# Patient Record
Sex: Male | Born: 1935
Health system: Southern US, Community
[De-identification: ages and names within clinical notes are randomized; demographics above are authoritative.]

## PROBLEM LIST (undated history)

## (undated) DIAGNOSIS — N3281 Overactive bladder: Secondary | ICD-10-CM

## (undated) DIAGNOSIS — R972 Elevated prostate specific antigen [PSA]: Secondary | ICD-10-CM

## (undated) DIAGNOSIS — E785 Hyperlipidemia, unspecified: Secondary | ICD-10-CM

## (undated) DIAGNOSIS — G20 Parkinson's disease: Secondary | ICD-10-CM

## (undated) DIAGNOSIS — T8859XA Other complications of anesthesia, initial encounter: Secondary | ICD-10-CM

## (undated) DIAGNOSIS — M5136 Other intervertebral disc degeneration, lumbar region: Secondary | ICD-10-CM

## (undated) DIAGNOSIS — F32A Depression, unspecified: Secondary | ICD-10-CM

## (undated) DIAGNOSIS — R682 Dry mouth, unspecified: Secondary | ICD-10-CM

## (undated) DIAGNOSIS — N400 Enlarged prostate without lower urinary tract symptoms: Secondary | ICD-10-CM

## (undated) DIAGNOSIS — N3941 Urge incontinence: Secondary | ICD-10-CM

## (undated) DIAGNOSIS — G20A1 Parkinson's disease without dyskinesia, without mention of fluctuations: Secondary | ICD-10-CM

## (undated) DIAGNOSIS — J309 Allergic rhinitis, unspecified: Secondary | ICD-10-CM

## (undated) DIAGNOSIS — R350 Frequency of micturition: Secondary | ICD-10-CM

## (undated) DIAGNOSIS — G4733 Obstructive sleep apnea (adult) (pediatric): Secondary | ICD-10-CM

## (undated) DIAGNOSIS — K219 Gastro-esophageal reflux disease without esophagitis: Secondary | ICD-10-CM

## (undated) DIAGNOSIS — H9190 Unspecified hearing loss, unspecified ear: Secondary | ICD-10-CM

## (undated) DIAGNOSIS — T4145XA Adverse effect of unspecified anesthetic, initial encounter: Secondary | ICD-10-CM

## (undated) DIAGNOSIS — Z87442 Personal history of urinary calculi: Secondary | ICD-10-CM

## (undated) DIAGNOSIS — M1990 Unspecified osteoarthritis, unspecified site: Secondary | ICD-10-CM

## (undated) DIAGNOSIS — I10 Essential (primary) hypertension: Secondary | ICD-10-CM

## (undated) DIAGNOSIS — M51369 Other intervertebral disc degeneration, lumbar region without mention of lumbar back pain or lower extremity pain: Secondary | ICD-10-CM

## (undated) DIAGNOSIS — F419 Anxiety disorder, unspecified: Secondary | ICD-10-CM

## (undated) DIAGNOSIS — R351 Nocturia: Secondary | ICD-10-CM

## (undated) DIAGNOSIS — C679 Malignant neoplasm of bladder, unspecified: Secondary | ICD-10-CM

## (undated) DIAGNOSIS — F329 Major depressive disorder, single episode, unspecified: Secondary | ICD-10-CM

## (undated) HISTORY — DX: Hyperlipidemia, unspecified: E78.5

## (undated) HISTORY — DX: Unspecified osteoarthritis, unspecified site: M19.90

## (undated) HISTORY — DX: Essential (primary) hypertension: I10

## (undated) HISTORY — DX: Parkinson's disease without dyskinesia, without mention of fluctuations: G20.A1

## (undated) HISTORY — PX: EYE SURGERY: SHX253

## (undated) HISTORY — DX: Parkinson's disease: G20

---
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
## 1967-04-21 HISTORY — PX: OTHER SURGICAL HISTORY: SHX169

## 1980-04-20 HISTORY — PX: NASAL SINUS SURGERY: SHX719

## 1998-10-24 ENCOUNTER — Ambulatory Visit (HOSPITAL_COMMUNITY): Admission: RE | Admit: 1998-10-24 | Discharge: 1998-10-24 | Payer: Self-pay | Admitting: Gastroenterology

## 1999-08-06 ENCOUNTER — Encounter: Payer: Self-pay | Admitting: Gastroenterology

## 1999-08-06 ENCOUNTER — Encounter: Admission: RE | Admit: 1999-08-06 | Discharge: 1999-08-06 | Payer: Self-pay | Admitting: Gastroenterology

## 2001-11-30 ENCOUNTER — Ambulatory Visit (HOSPITAL_COMMUNITY): Admission: RE | Admit: 2001-11-30 | Discharge: 2001-11-30 | Payer: Self-pay | Admitting: Gastroenterology

## 2001-11-30 ENCOUNTER — Encounter (INDEPENDENT_AMBULATORY_CARE_PROVIDER_SITE_OTHER): Payer: Self-pay | Admitting: Specialist

## 2003-08-09 ENCOUNTER — Encounter: Admission: RE | Admit: 2003-08-09 | Discharge: 2003-08-09 | Payer: Self-pay | Admitting: Family Medicine

## 2006-06-23 ENCOUNTER — Ambulatory Visit: Payer: Self-pay | Admitting: Family Medicine

## 2006-06-30 ENCOUNTER — Ambulatory Visit: Payer: Self-pay | Admitting: Family Medicine

## 2006-06-30 LAB — CONVERTED CEMR LAB
ALT: 7 units/L (ref 0–40)
AST: 17 units/L (ref 0–37)
BUN: 22 mg/dL (ref 6–23)
CO2: 30 meq/L (ref 19–32)
Calcium: 8.9 mg/dL (ref 8.4–10.5)
Chloride: 105 meq/L (ref 96–112)
Cholesterol: 112 mg/dL (ref 0–200)
Creatinine, Ser: 0.9 mg/dL (ref 0.4–1.5)
GFR calc Af Amer: 107 mL/min
GFR calc non Af Amer: 89 mL/min
Glucose, Bld: 102 mg/dL — ABNORMAL HIGH (ref 70–99)
HDL: 38 mg/dL — ABNORMAL LOW (ref >39.0)
LDL Cholesterol: 63 mg/dL (ref 0–99)
PSA: 2.6 ng/mL (ref 0.10–4.00)
Potassium: 4.4 meq/L (ref 3.5–5.1)
Sodium: 141 meq/L (ref 135–145)
Total CHOL/HDL Ratio: 2.9
Triglycerides: 56 mg/dL (ref 0–149)
VLDL: 11 mg/dL (ref 0–40)

## 2006-08-31 ENCOUNTER — Encounter (INDEPENDENT_AMBULATORY_CARE_PROVIDER_SITE_OTHER): Payer: Self-pay | Admitting: Family Medicine

## 2007-02-08 ENCOUNTER — Telehealth (INDEPENDENT_AMBULATORY_CARE_PROVIDER_SITE_OTHER): Payer: Self-pay | Admitting: *Deleted

## 2007-02-28 ENCOUNTER — Encounter (INDEPENDENT_AMBULATORY_CARE_PROVIDER_SITE_OTHER): Payer: Self-pay | Admitting: *Deleted

## 2007-02-28 ENCOUNTER — Telehealth (INDEPENDENT_AMBULATORY_CARE_PROVIDER_SITE_OTHER): Payer: Self-pay | Admitting: *Deleted

## 2007-02-28 ENCOUNTER — Ambulatory Visit: Payer: Self-pay | Admitting: Family Medicine

## 2007-02-28 DIAGNOSIS — M1990 Unspecified osteoarthritis, unspecified site: Secondary | ICD-10-CM | POA: Insufficient documentation

## 2007-02-28 DIAGNOSIS — I10 Essential (primary) hypertension: Secondary | ICD-10-CM | POA: Insufficient documentation

## 2007-02-28 DIAGNOSIS — F528 Other sexual dysfunction not due to a substance or known physiological condition: Secondary | ICD-10-CM | POA: Insufficient documentation

## 2007-02-28 DIAGNOSIS — E785 Hyperlipidemia, unspecified: Secondary | ICD-10-CM | POA: Insufficient documentation

## 2007-02-28 LAB — CONVERTED CEMR LAB
BUN: 20 mg/dL (ref 6–23)
CO2: 30 meq/L (ref 19–32)
Calcium: 9.3 mg/dL (ref 8.4–10.5)
Chloride: 103 meq/L (ref 96–112)
Creatinine, Ser: 0.9 mg/dL (ref 0.4–1.5)
GFR calc Af Amer: 107 mL/min
GFR calc non Af Amer: 89 mL/min
Glucose, Bld: 97 mg/dL (ref 70–99)
Potassium: 4.1 meq/L (ref 3.5–5.1)
Sodium: 138 meq/L (ref 135–145)

## 2007-03-09 ENCOUNTER — Encounter (INDEPENDENT_AMBULATORY_CARE_PROVIDER_SITE_OTHER): Payer: Self-pay | Admitting: Family Medicine

## 2007-05-13 ENCOUNTER — Ambulatory Visit: Payer: Self-pay | Admitting: Family Medicine

## 2007-05-17 ENCOUNTER — Encounter (INDEPENDENT_AMBULATORY_CARE_PROVIDER_SITE_OTHER): Payer: Self-pay | Admitting: *Deleted

## 2007-05-17 LAB — CONVERTED CEMR LAB
ALT: 7 units/L (ref 0–53)
BUN: 21 mg/dL (ref 6–23)
Calcium: 9.2 mg/dL (ref 8.4–10.5)
Chloride: 105 meq/L (ref 96–112)
Cholesterol: 130 mg/dL (ref 0–200)
GFR calc Af Amer: 95 mL/min
GFR calc non Af Amer: 78 mL/min
LDL Cholesterol: 80 mg/dL (ref 0–99)
PSA: 2.36 ng/mL (ref 0.10–4.00)
VLDL: 15 mg/dL (ref 0–40)

## 2007-05-26 ENCOUNTER — Ambulatory Visit: Payer: Self-pay | Admitting: Gastroenterology

## 2007-06-17 ENCOUNTER — Encounter (INDEPENDENT_AMBULATORY_CARE_PROVIDER_SITE_OTHER): Payer: Self-pay | Admitting: Family Medicine

## 2007-07-20 ENCOUNTER — Telehealth (INDEPENDENT_AMBULATORY_CARE_PROVIDER_SITE_OTHER): Payer: Self-pay | Admitting: *Deleted

## 2007-07-21 ENCOUNTER — Ambulatory Visit: Payer: Self-pay | Admitting: Family Medicine

## 2007-08-18 ENCOUNTER — Telehealth (INDEPENDENT_AMBULATORY_CARE_PROVIDER_SITE_OTHER): Payer: Self-pay | Admitting: *Deleted

## 2007-09-01 ENCOUNTER — Encounter: Payer: Self-pay | Admitting: Family Medicine

## 2007-09-01 ENCOUNTER — Telehealth (INDEPENDENT_AMBULATORY_CARE_PROVIDER_SITE_OTHER): Payer: Self-pay | Admitting: *Deleted

## 2007-10-19 ENCOUNTER — Ambulatory Visit: Payer: Self-pay | Admitting: Internal Medicine

## 2008-01-27 ENCOUNTER — Telehealth (INDEPENDENT_AMBULATORY_CARE_PROVIDER_SITE_OTHER): Payer: Self-pay | Admitting: *Deleted

## 2008-01-30 ENCOUNTER — Ambulatory Visit: Payer: Self-pay | Admitting: Family Medicine

## 2008-02-02 ENCOUNTER — Encounter (INDEPENDENT_AMBULATORY_CARE_PROVIDER_SITE_OTHER): Payer: Self-pay | Admitting: *Deleted

## 2008-02-02 LAB — CONVERTED CEMR LAB
Bilirubin, Direct: 0.2 mg/dL (ref 0.0–0.3)
Calcium: 9.3 mg/dL (ref 8.4–10.5)
Creatinine, Ser: 1 mg/dL (ref 0.4–1.5)
GFR calc Af Amer: 95 mL/min
Sodium: 142 meq/L (ref 135–145)
Total Bilirubin: 3.4 mg/dL — ABNORMAL HIGH (ref 0.3–1.2)
Total Protein: 7.2 g/dL (ref 6.0–8.3)

## 2008-03-30 ENCOUNTER — Telehealth (INDEPENDENT_AMBULATORY_CARE_PROVIDER_SITE_OTHER): Payer: Self-pay | Admitting: *Deleted

## 2008-05-01 ENCOUNTER — Telehealth (INDEPENDENT_AMBULATORY_CARE_PROVIDER_SITE_OTHER): Payer: Self-pay | Admitting: *Deleted

## 2008-06-28 ENCOUNTER — Telehealth (INDEPENDENT_AMBULATORY_CARE_PROVIDER_SITE_OTHER): Payer: Self-pay | Admitting: *Deleted

## 2008-07-03 ENCOUNTER — Encounter: Payer: Self-pay | Admitting: Family Medicine

## 2008-07-05 ENCOUNTER — Ambulatory Visit: Payer: Self-pay | Admitting: Family Medicine

## 2008-07-05 DIAGNOSIS — D485 Neoplasm of uncertain behavior of skin: Secondary | ICD-10-CM | POA: Insufficient documentation

## 2008-07-06 ENCOUNTER — Encounter (INDEPENDENT_AMBULATORY_CARE_PROVIDER_SITE_OTHER): Payer: Self-pay | Admitting: *Deleted

## 2008-07-06 LAB — CONVERTED CEMR LAB
AST: 17 units/L (ref 0–37)
Albumin: 4 g/dL (ref 3.5–5.2)
Alkaline Phosphatase: 103 units/L (ref 39–117)
Basophils Relative: 1 % (ref 0.0–3.0)
CO2: 29 meq/L (ref 19–32)
Calcium: 9.1 mg/dL (ref 8.4–10.5)
Cholesterol: 107 mg/dL (ref 0–200)
Creatinine, Ser: 1 mg/dL (ref 0.4–1.5)
Eosinophils Relative: 11.2 % — ABNORMAL HIGH (ref 0.0–5.0)
HDL: 31.3 mg/dL — ABNORMAL LOW (ref >39.00)
Hemoglobin: 16.8 g/dL (ref 13.0–17.0)
LDL Cholesterol: 63 mg/dL (ref 0–99)
Lymphocytes Relative: 31.2 % (ref 12.0–46.0)
MCHC: 34.6 g/dL (ref 30.0–36.0)
Monocytes Relative: 11.1 % (ref 3.0–12.0)
Neutro Abs: 2.9 10*3/uL (ref 1.4–7.7)
Neutrophils Relative %: 45.5 % (ref 43.0–77.0)
RBC: 5.25 M/uL (ref 4.22–5.81)
Sodium: 140 meq/L (ref 135–145)
Total CHOL/HDL Ratio: 3
Total Protein: 6.8 g/dL (ref 6.0–8.3)
Triglycerides: 64 mg/dL (ref 0.0–149.0)
WBC: 6.4 10*3/uL (ref 4.5–10.5)

## 2008-08-10 ENCOUNTER — Telehealth (INDEPENDENT_AMBULATORY_CARE_PROVIDER_SITE_OTHER): Payer: Self-pay | Admitting: *Deleted

## 2008-09-20 LAB — HM COLONOSCOPY: HM Colonoscopy: NORMAL

## 2008-12-04 ENCOUNTER — Encounter: Payer: Self-pay | Admitting: Family Medicine

## 2009-01-09 ENCOUNTER — Ambulatory Visit: Payer: Self-pay | Admitting: Family Medicine

## 2009-01-09 DIAGNOSIS — M549 Dorsalgia, unspecified: Secondary | ICD-10-CM | POA: Insufficient documentation

## 2009-01-11 ENCOUNTER — Encounter (INDEPENDENT_AMBULATORY_CARE_PROVIDER_SITE_OTHER): Payer: Self-pay | Admitting: *Deleted

## 2009-01-30 ENCOUNTER — Ambulatory Visit: Payer: Self-pay | Admitting: Internal Medicine

## 2009-02-08 ENCOUNTER — Ambulatory Visit: Payer: Self-pay | Admitting: Internal Medicine

## 2009-02-08 ENCOUNTER — Encounter: Payer: Self-pay | Admitting: Internal Medicine

## 2009-02-08 LAB — HM COLONOSCOPY

## 2009-02-12 ENCOUNTER — Encounter: Payer: Self-pay | Admitting: Internal Medicine

## 2009-03-20 ENCOUNTER — Telehealth (INDEPENDENT_AMBULATORY_CARE_PROVIDER_SITE_OTHER): Payer: Self-pay | Admitting: *Deleted

## 2009-04-02 ENCOUNTER — Telehealth (INDEPENDENT_AMBULATORY_CARE_PROVIDER_SITE_OTHER): Payer: Self-pay | Admitting: *Deleted

## 2009-04-10 ENCOUNTER — Telehealth (INDEPENDENT_AMBULATORY_CARE_PROVIDER_SITE_OTHER): Payer: Self-pay | Admitting: *Deleted

## 2009-04-15 ENCOUNTER — Encounter: Admission: RE | Admit: 2009-04-15 | Discharge: 2009-04-19 | Payer: Self-pay | Admitting: Family Medicine

## 2009-04-23 ENCOUNTER — Encounter: Admission: RE | Admit: 2009-04-23 | Discharge: 2009-05-29 | Payer: Self-pay | Admitting: Family Medicine

## 2009-04-24 ENCOUNTER — Encounter: Payer: Self-pay | Admitting: Internal Medicine

## 2009-04-26 ENCOUNTER — Encounter: Payer: Self-pay | Admitting: Internal Medicine

## 2009-05-20 ENCOUNTER — Telehealth (INDEPENDENT_AMBULATORY_CARE_PROVIDER_SITE_OTHER): Payer: Self-pay | Admitting: *Deleted

## 2009-05-22 ENCOUNTER — Ambulatory Visit: Payer: Self-pay | Admitting: Internal Medicine

## 2009-05-22 LAB — CONVERTED CEMR LAB
Blood in Urine, dipstick: NEGATIVE
Glucose, Urine, Semiquant: NEGATIVE
Protein, U semiquant: 100
pH: 5

## 2009-05-23 ENCOUNTER — Encounter: Payer: Self-pay | Admitting: Internal Medicine

## 2009-05-23 LAB — CONVERTED CEMR LAB
Casts: NONE SEEN /lpf
Crystals: NONE SEEN

## 2009-05-29 ENCOUNTER — Encounter: Payer: Self-pay | Admitting: Internal Medicine

## 2009-05-31 ENCOUNTER — Encounter: Payer: Self-pay | Admitting: Family Medicine

## 2009-06-11 ENCOUNTER — Telehealth: Payer: Self-pay | Admitting: Internal Medicine

## 2009-06-11 ENCOUNTER — Ambulatory Visit: Payer: Self-pay | Admitting: Family

## 2009-06-11 DIAGNOSIS — R972 Elevated prostate specific antigen [PSA]: Secondary | ICD-10-CM | POA: Insufficient documentation

## 2009-06-12 ENCOUNTER — Telehealth (INDEPENDENT_AMBULATORY_CARE_PROVIDER_SITE_OTHER): Payer: Self-pay | Admitting: *Deleted

## 2009-06-12 ENCOUNTER — Encounter: Admission: RE | Admit: 2009-06-12 | Discharge: 2009-06-12 | Payer: Self-pay | Admitting: Internal Medicine

## 2009-06-19 ENCOUNTER — Telehealth (INDEPENDENT_AMBULATORY_CARE_PROVIDER_SITE_OTHER): Payer: Self-pay | Admitting: *Deleted

## 2009-06-21 ENCOUNTER — Ambulatory Visit: Payer: Self-pay | Admitting: Internal Medicine

## 2009-06-27 LAB — CONVERTED CEMR LAB
BUN: 17 mg/dL (ref 6–23)
CO2: 30 meq/L (ref 19–32)
Chloride: 106 meq/L (ref 96–112)
Creatinine, Ser: 1 mg/dL (ref 0.4–1.5)
Glucose, Bld: 91 mg/dL (ref 70–99)

## 2009-07-15 ENCOUNTER — Encounter: Payer: Self-pay | Admitting: Internal Medicine

## 2009-07-17 ENCOUNTER — Telehealth: Payer: Self-pay | Admitting: Internal Medicine

## 2009-07-23 ENCOUNTER — Telehealth (INDEPENDENT_AMBULATORY_CARE_PROVIDER_SITE_OTHER): Payer: Self-pay | Admitting: *Deleted

## 2009-08-13 ENCOUNTER — Encounter: Payer: Self-pay | Admitting: Internal Medicine

## 2009-08-28 ENCOUNTER — Ambulatory Visit: Payer: Self-pay | Admitting: Family Medicine

## 2009-08-28 DIAGNOSIS — R634 Abnormal weight loss: Secondary | ICD-10-CM | POA: Insufficient documentation

## 2009-08-28 DIAGNOSIS — F32A Depression, unspecified: Secondary | ICD-10-CM | POA: Insufficient documentation

## 2009-08-28 DIAGNOSIS — F329 Major depressive disorder, single episode, unspecified: Secondary | ICD-10-CM

## 2009-09-04 ENCOUNTER — Ambulatory Visit: Payer: Self-pay | Admitting: Family Medicine

## 2009-09-05 LAB — CONVERTED CEMR LAB
BUN: 24 mg/dL — ABNORMAL HIGH (ref 6–23)
Bilirubin, Direct: 0.3 mg/dL (ref 0.0–0.3)
CO2: 30 meq/L (ref 19–32)
Chloride: 106 meq/L (ref 96–112)
Glucose, Bld: 96 mg/dL (ref 70–99)
LDL Cholesterol: 72 mg/dL (ref 0–99)
Potassium: 4.4 meq/L (ref 3.5–5.1)
Total Bilirubin: 2.2 mg/dL — ABNORMAL HIGH (ref 0.3–1.2)
Total CHOL/HDL Ratio: 3
Total Protein: 6.4 g/dL (ref 6.0–8.3)
VLDL: 11.8 mg/dL (ref 0.0–40.0)

## 2009-09-06 ENCOUNTER — Telehealth (INDEPENDENT_AMBULATORY_CARE_PROVIDER_SITE_OTHER): Payer: Self-pay | Admitting: *Deleted

## 2009-09-23 ENCOUNTER — Telehealth (INDEPENDENT_AMBULATORY_CARE_PROVIDER_SITE_OTHER): Payer: Self-pay | Admitting: *Deleted

## 2009-09-24 ENCOUNTER — Encounter: Payer: Self-pay | Admitting: Family Medicine

## 2009-10-22 ENCOUNTER — Encounter: Payer: Self-pay | Admitting: Family Medicine

## 2009-11-06 ENCOUNTER — Encounter: Payer: Self-pay | Admitting: Family Medicine

## 2009-11-14 ENCOUNTER — Encounter: Admission: RE | Admit: 2009-11-14 | Discharge: 2009-11-14 | Payer: Self-pay | Admitting: Neurosurgery

## 2009-12-02 ENCOUNTER — Encounter: Payer: Self-pay | Admitting: Family Medicine

## 2009-12-03 ENCOUNTER — Ambulatory Visit: Payer: Self-pay | Admitting: Family Medicine

## 2009-12-03 DIAGNOSIS — K219 Gastro-esophageal reflux disease without esophagitis: Secondary | ICD-10-CM | POA: Insufficient documentation

## 2009-12-04 ENCOUNTER — Telehealth (INDEPENDENT_AMBULATORY_CARE_PROVIDER_SITE_OTHER): Payer: Self-pay | Admitting: *Deleted

## 2009-12-04 LAB — CONVERTED CEMR LAB: H Pylori IgG: NEGATIVE

## 2009-12-10 ENCOUNTER — Encounter: Payer: Self-pay | Admitting: Family Medicine

## 2009-12-12 ENCOUNTER — Encounter: Payer: Self-pay | Admitting: Family Medicine

## 2009-12-24 ENCOUNTER — Encounter: Payer: Self-pay | Admitting: Family Medicine

## 2010-01-08 ENCOUNTER — Encounter: Payer: Self-pay | Admitting: Family Medicine

## 2010-02-06 ENCOUNTER — Encounter: Payer: Self-pay | Admitting: Family Medicine

## 2010-02-12 ENCOUNTER — Ambulatory Visit: Payer: Self-pay | Admitting: Family Medicine

## 2010-02-12 DIAGNOSIS — J309 Allergic rhinitis, unspecified: Secondary | ICD-10-CM | POA: Insufficient documentation

## 2010-02-12 LAB — CONVERTED CEMR LAB
Alkaline Phosphatase: 132 units/L — ABNORMAL HIGH (ref 39–117)
Basophils Relative: 1.2 % (ref 0.0–3.0)
Bilirubin, Direct: 0.3 mg/dL (ref 0.0–0.3)
CO2: 27 meq/L (ref 19–32)
Chloride: 102 meq/L (ref 96–112)
Creatinine, Ser: 0.8 mg/dL (ref 0.4–1.5)
Eosinophils Absolute: 0.5 10*3/uL (ref 0.0–0.7)
Hemoglobin: 14.9 g/dL (ref 13.0–17.0)
LDL Cholesterol: 68 mg/dL (ref 0–99)
MCHC: 34 g/dL (ref 30.0–36.0)
MCV: 93.4 fL (ref 78.0–100.0)
Monocytes Absolute: 0.6 10*3/uL (ref 0.1–1.0)
Neutro Abs: 3.9 10*3/uL (ref 1.4–7.7)
Neutrophils Relative %: 55.2 % (ref 43.0–77.0)
RBC: 4.71 M/uL (ref 4.22–5.81)
Total Bilirubin: 1.9 mg/dL — ABNORMAL HIGH (ref 0.3–1.2)
Total CHOL/HDL Ratio: 3

## 2010-04-03 ENCOUNTER — Encounter: Payer: Self-pay | Admitting: Family Medicine

## 2010-04-04 ENCOUNTER — Telehealth (INDEPENDENT_AMBULATORY_CARE_PROVIDER_SITE_OTHER): Payer: Self-pay | Admitting: *Deleted

## 2010-04-22 ENCOUNTER — Telehealth (INDEPENDENT_AMBULATORY_CARE_PROVIDER_SITE_OTHER): Payer: Self-pay | Admitting: *Deleted

## 2010-05-07 ENCOUNTER — Encounter
Admission: RE | Admit: 2010-05-07 | Discharge: 2010-05-20 | Payer: Self-pay | Source: Home / Self Care | Attending: Neurology | Admitting: Neurology

## 2010-05-18 LAB — CONVERTED CEMR LAB
AST: 17 units/L (ref 0–37)
BUN: 21 mg/dL (ref 6–23)
Basophils Absolute: 0 10*3/uL (ref 0.0–0.1)
Bilirubin, Direct: 0.2 mg/dL (ref 0.0–0.3)
CO2: 28 meq/L (ref 19–32)
Chloride: 107 meq/L (ref 96–112)
GFR calc non Af Amer: 77.9 mL/min (ref >60)
Glucose, Bld: 100 mg/dL — ABNORMAL HIGH (ref 70–99)
HCT: 48 % (ref 39.0–52.0)
HDL: 37 mg/dL — ABNORMAL LOW (ref >39.00)
Hemoglobin: 16.4 g/dL (ref 13.0–17.0)
Lymphs Abs: 2.1 10*3/uL (ref 0.7–4.0)
MCHC: 34.2 g/dL (ref 30.0–36.0)
MCV: 92 fL (ref 78.0–100.0)
Monocytes Absolute: 0.6 10*3/uL (ref 0.1–1.0)
Monocytes Relative: 9.4 % (ref 3.0–12.0)
Neutro Abs: 3 10*3/uL (ref 1.4–7.7)
PSA: 2.68 ng/mL (ref 0.10–4.00)
Platelets: 203 10*3/uL (ref 150.0–400.0)
Potassium: 3.9 meq/L (ref 3.5–5.1)
RDW: 13.1 % (ref 11.5–14.6)
Sodium: 141 meq/L (ref 135–145)
Total Bilirubin: 2.9 mg/dL — ABNORMAL HIGH (ref 0.3–1.2)
Total CHOL/HDL Ratio: 3
VLDL: 15.2 mg/dL (ref 0.0–40.0)

## 2010-05-20 NOTE — Progress Notes (Signed)
Summary: lipitor refill  Phone Note Refill Request Message from:  Fax from Pharmacy on July 23, 2009 12:48 PM  Refills Requested: Medication #1:  LIPITOR 40 MG TABS Take one tablet daily [facility] [address] fax [phone]   Method Requested: Fax to Local Pharmacy Next Appointment Scheduled: no appt Initial call taken by: Barb Merino,  July 23, 2009 12:48 PM    Prescriptions: LIPITOR 40 MG TABS (ATORVASTATIN CALCIUM) Take one tablet daily  #30 x 0   Entered by:   Doristine Devoid   Authorized by:   Neena Rhymes MD   Signed by:   Doristine Devoid on 07/23/2009   Method used:   Electronically to        [facility] [address] (retail)       [address]       [place], [place]  [address]       Ph: [phone]       Fax: [phone]   RxID:   [id number]

## 2010-05-20 NOTE — Consult Note (Signed)
Summary: [facility]  [facility]   Imported By: Lanelle Bal 02/20/2010 09:43:08  _____________________________________________________________________  External Attachment:    Type:   Image     Comment:   External Document

## 2010-05-20 NOTE — Medication Information (Signed)
Summary: Letter Regarding [place]   Letter Regarding [place]   Imported By: Lanelle Bal 05/17/2009 12:14:21  _____________________________________________________________________  External Attachment:    Type:   Image     Comment:   External Document  Appended Document: Letter Regarding [place]  due for OV, please arrange   Appended Document: Letter Regarding [place]  ov scheduled

## 2010-05-20 NOTE — Letter (Signed)
Summary: [facility]  [facility]   Imported By: Lanelle Bal 12/18/2009 12:51:44  _____________________________________________________________________  External Attachment:    Type:   Image     Comment:   External Document

## 2010-05-20 NOTE — Consult Note (Signed)
Summary: [facility]  [facility]   Imported By: Lennie Odor 11/13/2009 13:44:26  _____________________________________________________________________  External Attachment:    Type:   Image     Comment:   External Document

## 2010-05-20 NOTE — Consult Note (Signed)
Summary: [facility]  [facility]   Imported By: Lanelle Bal 10/03/2009 08:21:36  _____________________________________________________________________  External Attachment:    Type:   Image     Comment:   External Document

## 2010-05-20 NOTE — Assessment & Plan Note (Signed)
Summary: fatigue,other issues/cbs   Vital Signs:  Patient profile:   77 year old male Weight:      204 pounds O2 Sat:      92 % on Room air Pulse rate:   103 / minute BP sitting:   120 / 80  (left arm)  Vitals Entered By: Doristine Devoid (Aug 28, 2009 1:18 PM)  O2 Flow:  Room air CC: discuss current status of back pain and has only been receieving injections    History of Present Illness: 77 yo man here today for  1) Back pain- PT was not helpful, MRI revealed slipped discs, bilateral pars defect, and spinal stenosis.  saw [facility] Ethelene Hal).  started injxns.  1 week into injxns felt well.  subsequently developed groin pain on R- sharp stabbing pain that 'brought me to my knees'.  discussed this w/ ortho.  had 2nd series of back injxns 3 weeks ago.  has f/u w/ ortho in 2 weeks.  continues to feel 'so bad'.  constant throbbing pain.  difficult to get out of bed.  already on oxycodone.  would like 2nd opinion  2) Elevated PSA- PSA decreased after taking Cipro for prostatitis.  3) Wt loss- 10 lbs in 7 months.    4) Depression- pt admits to depression regarding constant pain.  Current Medications (verified): 1)  Carbidopa-Levodopa 25-100 Mg Tabs (Carbidopa-Levodopa) .... Take One Tablet As Needed 2)  Carbidopa-Levodopa Cr 50-200 Mg Tbcr (Carbidopa-Levodopa) .... Take One Tablet 4 Times Daily 3)  Amantadine Hcl 100 Mg Caps (Amantadine Hcl) .... Take One Tablet 4 Times Daily 4)  Selegiline Hcl 5 Mg Tabs (Selegiline Hcl) .Marland Kitchen.. 1 Tab By Mouth Two Times A Day.  Prescribed By Neuro 5)  Fexofenadine Hcl 180 Mg Tabs (Fexofenadine Hcl) .... Take 1 Tablet By Mouth Once A Day 6)  Losartan Potassium 50 Mg Tabs (Losartan Potassium) .... One By Mouth Twice A Day 7)  Fluticasone Propionate 50 Mcg/act Susp (Fluticasone Propionate) .... Spray 2 Spray Into Both Nostrils Once A Day 8)  Lipitor 40 Mg Tabs (Atorvastatin Calcium) .... Take One Tablet Daily 9)  Bayer Low Strength 81 Mg Tbec (Aspirin) .... Once  Daily 10)  Oxycodone-Acetaminophen 5-325 Mg Tabs (Oxycodone-Acetaminophen) .... One or Two By Mouth Every 6 Hours As Needed For Pain 11)  Celebrex 200 Mg Caps (Celecoxib) .... Take One Tablet Daily  Allergies (verified): No Known Drug Allergies  Past History:  Past Medical History: Last updated: 05/22/2009 Hyperlipidemia Hypertension Osteoarthritis PARKINSON'S DISEASE  ERECTILE DYSFUNCTION    Review of Systems      See HPI  Physical Exam  General:  alert and well-developed.   Psych:  noteable dejected, somewhat withdrawn   Impression & Recommendations:  Problem # 1:  BACK PAIN (ICD-724.5) Assessment Unchanged pt would like a 2nd opinion as he's not happy w/ the route ortho is taking.  given MRI results will refer to neurosurg. The following medications were removed from the medication list:    Tylenol Arthritis Pain 650 Mg Cr-tabs (Acetaminophen) .Marland Kitchen... As needed    Aleve 220 Mg Tabs (Naproxen sodium) .Marland Kitchen... Take 1 tablet by mouth two times a day His updated medication list for this problem includes:    Bayer Low Strength 81 Mg Tbec (Aspirin) ..... Once daily    Oxycodone-acetaminophen 5-325 Mg Tabs (Oxycodone-acetaminophen) ..... One or two by mouth every 6 hours as needed for pain    Celebrex 200 Mg Caps (Celecoxib) .Marland Kitchen... Take one tablet daily  Orders: Neurosurgeon  Referral (Neurosurgeon)  Problem # 2:  PSA, INCREASED (ICD-790.93) Assessment: Improved PSA normalized after taking Cipro.  will continue to follow.  Problem # 3:  DEPRESSIVE DISORDER (ICD-311) Assessment: New pt admits to depression regarding his constant pain.  may be cause of pt's wt loss.  may need to discuss therapy or meds as an option at upcoming visit if pt unable to make any headway in the tx of his pain.  Problem # 4:  WEIGHT LOSS (ICD-783.21) Assessment: New has lost 10 lbs in 7 months.  during this time has been physically inactive due to back pain and admittedly depressed.  will follow  closely.  Complete Medication List: 1)  Carbidopa-levodopa 25-100 Mg Tabs (Carbidopa-levodopa) .... Take one tablet as needed 2)  Carbidopa-levodopa Cr 50-200 Mg Tbcr (Carbidopa-levodopa) .... Take one tablet 4 times daily 3)  Amantadine Hcl 100 Mg Caps (Amantadine hcl) .... Take one tablet 4 times daily 4)  Selegiline Hcl 5 Mg Tabs (Selegiline hcl) .Marland Kitchen.. 1 tab by mouth two times a day.  prescribed by neuro 5)  Fexofenadine Hcl 180 Mg Tabs (Fexofenadine hcl) .... Take 1 tablet by mouth once a day 6)  Losartan Potassium 50 Mg Tabs (Losartan potassium) .... One by mouth twice a day 7)  Fluticasone Propionate 50 Mcg/act Susp (Fluticasone propionate) .... Spray 2 spray into both nostrils once a day 8)  Lipitor 40 Mg Tabs (Atorvastatin calcium) .... Take one tablet daily 9)  Bayer Low Strength 81 Mg Tbec (Aspirin) .... Once daily 10)  Oxycodone-acetaminophen 5-325 Mg Tabs (Oxycodone-acetaminophen) .... One or two by mouth every 6 hours as needed for pain 11)  Celebrex 200 Mg Caps (Celecoxib) .... Take one tablet daily  Patient Instructions: 1)  Please schedule a follow-up appointment in 3 months to recheck weight. 2)  Someone will call you with your neurosurgery appt 3)  Continue to take your pain meds 4)  Please call here or ortho if your symptoms change or worsen 5)  Make sure you are eating regularly 6)  HANG IN THERE!!!  Prescriptions: CELEBREX 200 MG CAPS (CELECOXIB) take one tablet daily  #30 x 6   Entered and Authorized by:   Neena Rhymes MD   Signed by:   Neena Rhymes MD on 08/28/2009   Method used:   Electronically to        [facility] [address] [address] (retail)       [address]       [place], [place]  [address]       Ph: [phone]       Fax: [phone]   RxID:   [id number] LIPITOR 40 MG TABS (ATORVASTATIN CALCIUM) Take one tablet daily  #30 x 6   Entered and Authorized by:   Neena Rhymes MD   Signed by:   Neena Rhymes MD on  08/28/2009   Method used:   Electronically to        [facility] [address] (retail)       [address]       [place], [place]  [address]       Ph: [phone]       Fax: [phone]   RxID:   [id number]

## 2010-05-20 NOTE — Assessment & Plan Note (Signed)
Summary: cpx * lab/cbs  Flu Vaccine Consent Questions     Do you have a history of severe allergic reactions to this vaccine? no    Any prior history of allergic reactions to egg and/or gelatin? no    Do you have a sensitivity to the preservative Thimersol? no    Do you have a past history of Guillan-Barre Syndrome? no    Do you currently have an acute febrile illness? no    Have you ever had a severe reaction to latex? no    Vaccine information given and explained to patient? yes    Are you currently pregnant? no    Lot Number:[id number]   Exp Date:10/18/2010   Site Given  Right Deltoid IM    Vital Signs:  Patient profile:   77 year old male Height:      70.50 inches Weight:      208 pounds BMI:     29.53 Temp:     98.0 degrees F oral Pulse rate:   86 / minute BP sitting:   110 / 80  (right arm)  Vitals Entered By: Doristine Devoid CMA (February 12, 2010 8:06 AM) CC: YEARLY EXAM AND LABS   History of Present Illness: 77 yo man here today for CPE.  UTD on colonoscopy.  congestion- sxs started 3 weeks ago, + nasal congestion, PND.  intermittant cough.  no fevers.  + ear congestion.  Here for Medicare AWV:  1.   Risk factors based on Past M, S, F history: depression- denies sxs b/c pain has improved. on nortriptyline nightly  HTN- well controlled today, no CP, SOB, HAs, visual changes, edema Hyperlipidemia- on lipitor. denies abd pain, N/V 2.   Physical Activities: walking 3.   Depression/mood: see above 4.   Hearing: normal to whispered voice at 6 ft 5.   ADL's: independent 6.   Fall Risk: falling at home due to Parkinson's balance issues, declines HH safety eval or PT referral 7.   Home Safety: safe at home, lives w/ wife 8.   Height, weight, &visual acuity: see vitals, vision corrected to 20/20 9.   Counseling: provided on healthy diet and regular exercise 10.   Labs ordered based on risk factors: see A&P 11.           Referral Coordination: see A&P 12.           Care  Plan: see A&P 13.           Cognitive Assessment- AAOx3, memory intact, linear thought process  Preventive Screening-Counseling & Management  Alcohol-Tobacco     Alcohol drinks/day: <1     Smoking Status: never  Caffeine-Diet-Exercise     Does Patient Exercise: yes      Sexual History:  currently monogamous.        Drug Use:  never.    Current Medications (verified): 1)  Carbidopa-Levodopa 25-100 Mg Tabs (Carbidopa-Levodopa) .... Take One Tablet As Needed 2)  Carbidopa-Levodopa Cr 50-200 Mg Tbcr (Carbidopa-Levodopa) .... Take One Tablet 4 1/2 Tablets Daily 3)  Amantadine Hcl 100 Mg Caps (Amantadine Hcl) .... Take One Tablet 4 Times Daily 4)  Selegiline Hcl 5 Mg Tabs (Selegiline Hcl) .Marland Kitchen.. 1 Tab By Mouth Two Times A Day.  Prescribed By Neuro 5)  Allegra Allergy 180 Mg Tabs (Fexofenadine Hcl) .... Take One Tablet Daily 6)  Losartan Potassium 50 Mg Tabs (Losartan Potassium) .... One By Mouth Twice A Day 7)  Fluticasone Propionate 50 Mcg/act Susp (Fluticasone Propionate) .Marland KitchenMarland KitchenMarland Kitchen  Spray 2 Spray Into Both Nostrils Once A Day 8)  Lipitor 40 Mg Tabs (Atorvastatin Calcium) .... Take One Tablet Daily 9)  Bayer Low Strength 81 Mg Tbec (Aspirin) .... Once Daily 10)  Oxycodone-Acetaminophen 10-325 Mg Tabs (Oxycodone-Acetaminophen) .Marland Kitchen.. 1-2 By Mouth Q 6h As Needed For Pain 11)  Celebrex 200 Mg Caps (Celecoxib) .... Take One Tablet Daily 12)  Omeprazole 40 Mg Cpdr (Omeprazole) .Marland Kitchen.. 1 Tab By Mouth Daily 13)  Nortriptyline Hcl 25 Mg Caps (Nortriptyline Hcl) .... Take One Tablet At Bedtime  Allergies (verified): No Known Drug Allergies  Past History:  Past medical, surgical, family and social histories (including risk factors) reviewed, and no changes noted (except as noted below).  Past Medical History: Reviewed history from 05/22/2009 and no changes required. Hyperlipidemia Hypertension Osteoarthritis PARKINSON'S DISEASE  ERECTILE DYSFUNCTION    Past Surgical History: Reviewed history from  05/13/2007 and no changes required. Right arm fracture: ORIF Left forehead cyst  Family History: Reviewed history from 05/13/2007 and no changes required. Mother died at 51: Alzhemier dementia Father died secondary to liver cancer  Social History: Reviewed history from 12/03/2009 and no changes required. Occupation:Retired Married, daughter in college Never Smoked Drug use-no Regular exercise-no Sexual History:  currently monogamous Drug Use:  never  Review of Systems       The patient complains of abdominal pain and muscle weakness.  The patient denies anorexia, fever, weight loss, weight gain, vision loss, decreased hearing, hoarseness, chest pain, syncope, dyspnea on exertion, peripheral edema, prolonged cough, headaches, melena, hematochezia, severe indigestion/heartburn, hematuria, suspicious skin lesions, depression, abnormal bleeding, enlarged lymph nodes, and testicular masses.         abd pain- gas pain due to constipation from narcotics  Physical Exam  General:  alert and well-developed.   Head:  Normocephalic and atraumatic without obvious abnormalities. No apparent alopecia or balding. Eyes:  No corneal or conjunctival inflammation noted. EOMI. Perrla. Funduscopic exam benign, without hemorrhages, exudates or papilledema. Vision grossly normal. Ears:  External ear exam shows no significant lesions or deformities.  Otoscopic examination reveals clear canals, tympanic membranes are intact bilaterally without bulging, retraction, inflammation or discharge. Hearing is grossly normal bilaterally. Nose:  mild congestion Mouth:  Oral mucosa and oropharynx without lesions or exudates.  Teeth in good repair.  + PND Neck:  supple and no cervical lymphadenopathy. Lungs:  Normal respiratory effort, chest expands symmetrically. Lungs are clear to auscultation, no crackles or wheezes. Heart:  Normal rate and regular rhythm. S1 and S2 normal without gallop, murmur, click, rub or other  extra sounds. Abdomen:  soft, non-tender, no distention, no masses, no guarding, and no rigidity.   Rectal:  external hemorrhoids noted. Normal sphincter tone. No rectal masses or tenderness. Genitalia:  Testes bilaterally descended without nodularity, tenderness or masses. No scrotal masses or lesions. No penis lesions or urethral discharge. Prostate:  Prostate gland firm and smooth, no enlargement, nodularity,  mass, asymmetry or induration. Pulses:  +2 carotid, radial, DP Extremities:  no edema Neurologic:  No cranial nerve deficits noted. Station and gait are normal. Plantar reflexes are down-going bilaterally. DTRs are symmetrical throughout. Sensory, motor and coordinative functions appear intact.  mild pill rolling tremor of hands Skin:  solar damage and seborrheic keratosis.   lipoma of upper back Cervical Nodes:  No lymphadenopathy noted Inguinal Nodes:  No significant adenopathy Psych:  Cognition and judgment appear intact. Alert and cooperative with normal attention span and concentration. No apparent delusions, illusions, hallucinations   Impression &  Recommendations:  Problem # 1:  PREVENTIVE HEALTH CARE (ICD-V70.0) Assessment Unchanged  pt's PE WNL.  UTD on health maintainence.  check labs.  anticipatory guidance provided.  Orders: Medicare -1st Annual Wellness Visit [phone])  Problem # 2:  DEPRESSIVE DISORDER (ICD-311) Assessment: Improved pt's mood much improved now that back pain is under control.  will continue to follow. His updated medication list for this problem includes:    Nortriptyline Hcl 25 Mg Caps (Nortriptyline hcl) .Marland Kitchen... Take one tablet at bedtime  Problem # 3:  HYPERTENSION (ICD-401.9) Assessment: Unchanged BP well controlled.  asymptomatic.  no changes at this time. His updated medication list for this problem includes:    Losartan Potassium 50 Mg Tabs (Losartan potassium) ..... One by mouth twice a day  Orders: Venipuncture ([address]) TLB-BMP (Basic  Metabolic Panel-BMET) (80048-METABOL) TLB-CBC Platelet - w/Differential (85025-CBCD) TLB-TSH (Thyroid Stimulating Hormone) (84443-TSH)  Problem # 4:  HYPERLIPIDEMIA (ICD-272.4) Assessment: Unchanged due for labs.  tolerating statin w/out difficulty. His updated medication list for this problem includes:    Lipitor 40 Mg Tabs (Atorvastatin calcium) .Marland Kitchen... Take one tablet daily  Orders: TLB-Lipid Panel (80061-LIPID) TLB-Hepatic/Liver Function Pnl (80076-HEPATIC)  Problem # 5:  RHINITIS (ICD-477.9) Assessment: New pt using allergy meds regularly.  his congestion and PND appear to be allergy related as there is no evidence of infxn on exam.  reviewed supportive care and red flags that should prompt return.  Pt expresses understanding and is in agreement w/ this plan. His updated medication list for this problem includes:    Allegra Allergy 180 Mg Tabs (Fexofenadine hcl) .Marland Kitchen... Take one tablet daily    Fluticasone Propionate 50 Mcg/act Susp (Fluticasone propionate) ..... Spray 2 spray into both nostrils once a day  Complete Medication List: 1)  Carbidopa-levodopa 25-100 Mg Tabs (Carbidopa-levodopa) .... Take one tablet as needed 2)  Carbidopa-levodopa Cr 50-200 Mg Tbcr (Carbidopa-levodopa) .... Take 4 1/2 tablets daily 3)  Amantadine Hcl 100 Mg Caps (Amantadine hcl) .... Take one tablet 4 times daily 4)  Selegiline Hcl 5 Mg Tabs (Selegiline hcl) .Marland Kitchen.. 1 tab by mouth two times a day.  prescribed by neuro 5)  Allegra Allergy 180 Mg Tabs (Fexofenadine hcl) .... Take one tablet daily 6)  Losartan Potassium 50 Mg Tabs (Losartan potassium) .... One by mouth twice a day 7)  Fluticasone Propionate 50 Mcg/act Susp (Fluticasone propionate) .... Spray 2 spray into both nostrils once a day 8)  Lipitor 40 Mg Tabs (Atorvastatin calcium) .... Take one tablet daily 9)  Bayer Low Strength 81 Mg Tbec (Aspirin) .... Once daily 10)  Oxycodone-acetaminophen 10-325 Mg Tabs (Oxycodone-acetaminophen) .Marland Kitchen.. 1-2 by  mouth q 6h as needed for pain 11)  Celebrex 200 Mg Caps (Celecoxib) .... Take one tablet daily 12)  Omeprazole 40 Mg Cpdr (Omeprazole) .Marland Kitchen.. 1 tab by mouth daily 13)  Nortriptyline Hcl 25 Mg Caps (Nortriptyline hcl) .... Take one tablet at bedtime  Other Orders: TLB-PSA (Prostate Specific Antigen) (84153-PSA) Flu Vaccine 95yrs + MEDICARE PATIENTS (U9811) Administration Flu vaccine - MCR (B1478)  Patient Instructions: 1)  Follow up in 6 months to recheck BP and cholesterol- sooner if needed 2)  Try and get back to regular activity- this will improve your strength and balance 3)  If you continue to have falls or other concerns- please call!  We'll arrange home health safety evaluation or PT program for strength and balance 4)  We'll notify you of your lab results 5)  Call with any questions or concerns 6)  Have  a great holiday season!!!   Orders Added: 1)  Venipuncture [36415] 2)  TLB-BMP (Basic Metabolic Panel-BMET) [80048-METABOL] 3)  TLB-CBC Platelet - w/Differential [85025-CBCD] 4)  TLB-TSH (Thyroid Stimulating Hormone) [84443-TSH] 5)  TLB-Lipid Panel [80061-LIPID] 6)  TLB-Hepatic/Liver Function Pnl [80076-HEPATIC] 7)  TLB-PSA (Prostate Specific Antigen) [84153-PSA] 8)  Flu Vaccine 14yrs + MEDICARE PATIENTS [Q2039] 9)  Administration Flu vaccine - MCR [G0008] 10)  Medicare -1st Annual Wellness Visit [G0438] 11)  Est. Patient Level III [38101]

## 2010-05-20 NOTE — Op Note (Signed)
Summary: [facility]ial [facility] Dorsal Ramus Block/[facility]   Medial Branch Block & Dorsal Ramus Block/[facility]   Imported By: Lanelle Bal 01/06/2010 10:15:00  _____________________________________________________________________  External Attachment:    Type:   Image     Comment:   External Document

## 2010-05-20 NOTE — Consult Note (Signed)
Summary: [facility]  [facility]   Imported By: Lanelle Bal 01/17/2010 14:21:01  _____________________________________________________________________  External Attachment:    Type:   Image     Comment:   External Document

## 2010-05-20 NOTE — Assessment & Plan Note (Signed)
Summary: 1 mo. f/u - jr   Vital Signs:  Patient profile:   77 year old male Height:      70.25 inches Weight:      214 pounds BMI:     30.60 Pulse rate:   76 / minute BP sitting:   120 / 80  Vitals Entered By: Shary Decamp (June 21, 2009 11:03 AM) CC: 1 mo rov, needs rf on oxycodone   History of Present Illness: here for follow-up  Current Medications (verified): 1)  Carbidopa-Levodopa 25-100 Mg Tabs (Carbidopa-Levodopa) .... Take One Tablet As Needed 2)  Carbidopa-Levodopa Cr 50-200 Mg Tbcr (Carbidopa-Levodopa) .... Take One Tablet 4 Times Daily 3)  Amantadine Hcl 100 Mg Caps (Amantadine Hcl) .... Take One Tablet 4 Times Daily 4)  Selegiline Hcl 5 Mg Tabs (Selegiline Hcl) .Marland Kitchen.. 1 Tab By Mouth Two Times A Day.  Prescribed By Neuro 5)  Fexofenadine Hcl 180 Mg Tabs (Fexofenadine Hcl) .... Take 1 Tablet By Mouth Once A Day 6)  Losartan Potassium 50 Mg Tabs (Losartan Potassium) .... One By Mouth Twice A Day 7)  Fluticasone Propionate 50 Mcg/act Susp (Fluticasone Propionate) .... Spray 2 Spray Into Both Nostrils Once A Day 8)  Lipitor 40 Mg Tabs (Atorvastatin Calcium) .... Take One Tablet Daily 9)  Bayer Low Strength 81 Mg Tbec (Aspirin) .... Once Daily 10)  Tylenol Arthritis Pain 650 Mg Cr-Tabs (Acetaminophen) .... As Needed 11)  Oxycodone-Acetaminophen 5-325 Mg Tabs (Oxycodone-Acetaminophen) .... One or Two By Mouth Every 6 Hours As Needed For Pain 12)  Aleve 220 Mg Tabs (Naproxen Sodium) .... Take 1 Tablet By Mouth Two Times A Day  Allergies (verified): No Known Drug Allergies  Past History:  Past Medical History: Reviewed history from 05/22/2009 and no changes required. Hyperlipidemia Hypertension Osteoarthritis PARKINSON'S DISEASE  ERECTILE DYSFUNCTION    Past Surgical History: Reviewed history from 05/13/2007 and no changes required. Right arm fracture: ORIF Left forehead cyst  Social History: Reviewed history from 01/30/2008 and no changes  required. Occupation:Retired Married Never Smoked Drug use-no Regular exercise-no  Review of Systems       recently seen with acute back pain, after MRI, he was referred to Dr.Ramos, he saw Dr. Grant Fontana  [place] they are planning a local injection next week pain is 85% better with oxycodone, he takes  approximately 3 tablets a day denies nausea or vomiting, he has some constipation at baseline and that has not been increased  by oxycodone we recently changed his BP medication, he is tolerating it well, ambulatory blood pressures in the 13/80 range he was also seen with prostatitis type  of symptoms, status-post antibiotics, feels better  Physical Exam  General:  alert and well-developed.   Msk:  not tender to palpation of the lower back Neurologic:  strength and reflexes are symmetric on the lower extremities.  gait is normal   Impression & Recommendations:  Problem # 1:  BACK PAIN (ICD-724.5) recently seen with acute back pain, after MRI, he was referred to Dr.Ramos, he saw Dr. Grant Fontana  [place] they are planning a local injection next week  he is tolerating oxycodone very well, and takes on average 3 tablets a day.  RF meds   His updated medication list for this problem includes:    Bayer Low Strength 81 Mg Tbec (Aspirin) ..... Once daily    Tylenol Arthritis Pain 650 Mg Cr-tabs (Acetaminophen) .Marland Kitchen... As needed    Oxycodone-acetaminophen 5-325 Mg Tabs (Oxycodone-acetaminophen) ..... One or two by mouth every  6 hours as needed for pain    Aleve 220 Mg Tabs (Naproxen sodium) .Marland Kitchen... Take 1 tablet by mouth two times a day  Problem # 2:  PSA, INCREASED (ICD-790.93) he was also seen with prostatitis type  of symptoms recently, PSA was a slightly elevated status-post antibiotics, feels better plan: Recheck a PSA, if it  is still elevated he will keep his appointment with urology if  it is back to baseline, he will  cancel his urology appointment  Orders: TLB-PSA (Prostate Specific Antigen)  (84153-PSA)  Problem # 3:  HYPERTENSION (ICD-401.9) we recently changed his BP medication, he is tolerating it well, ambulatory blood pressures in the 13/80 range labs  His updated medication list for this problem includes:    Losartan Potassium 50 Mg Tabs (Losartan potassium) ..... One by mouth twice a day  BP today: 120/80 Prior BP: 140/80 (06/11/2009)  Labs Reviewed: K+: 3.9 (01/09/2009) Creat: : 1.0 (01/09/2009)   Chol: 121 (01/09/2009)   HDL: 37.00 (01/09/2009)   LDL: 69 (01/09/2009)   TG: 76.0 (01/09/2009)  Orders: Venipuncture (16109) TLB-BMP (Basic Metabolic Panel-BMET) (80048-METABOL)  Complete Medication List: 1)  Carbidopa-levodopa 25-100 Mg Tabs (Carbidopa-levodopa) .... Take one tablet as needed 2)  Carbidopa-levodopa Cr 50-200 Mg Tbcr (Carbidopa-levodopa) .... Take one tablet 4 times daily 3)  Amantadine Hcl 100 Mg Caps (Amantadine hcl) .... Take one tablet 4 times daily 4)  Selegiline Hcl 5 Mg Tabs (Selegiline hcl) .Marland Kitchen.. 1 tab by mouth two times a day.  prescribed by neuro 5)  Fexofenadine Hcl 180 Mg Tabs (Fexofenadine hcl) .... Take 1 tablet by mouth once a day 6)  Losartan Potassium 50 Mg Tabs (Losartan potassium) .... One by mouth twice a day 7)  Fluticasone Propionate 50 Mcg/act Susp (Fluticasone propionate) .... Spray 2 spray into both nostrils once a day 8)  Lipitor 40 Mg Tabs (Atorvastatin calcium) .... Take one tablet daily 9)  Bayer Low Strength 81 Mg Tbec (Aspirin) .... Once daily 10)  Tylenol Arthritis Pain 650 Mg Cr-tabs (Acetaminophen) .... As needed 11)  Oxycodone-acetaminophen 5-325 Mg Tabs (Oxycodone-acetaminophen) .... One or two by mouth every 6 hours as needed for pain 12)  Aleve 220 Mg Tabs (Naproxen sodium) .... Take 1 tablet by mouth two times a day  Patient Instructions: 1)  Please schedule a follow-up appointment in 3 to 4  months w/ Dr Beverely Low Prescriptions: OXYCODONE-ACETAMINOPHEN 5-325 MG TABS (OXYCODONE-ACETAMINOPHEN) one or two by mouth  every 6 hours as needed for pain  #90 x 0   Entered and Authorized by:   Nolon Rod. Sydnee Lamour MD   Signed by:   Nolon Rod. Eren Ryser MD on 06/21/2009   Method used:   Print then Give to Patient   RxID:   [id number]

## 2010-05-20 NOTE — Progress Notes (Signed)
Summary: ?rf oxycodone  Phone Note Call from Patient Call back at [facility] Phone [phone] Call back at Work Phone [phone]   Summary of Call: PATIENT LEFT MESSAGE ON VM: Requesting rx for oxycodone.  Having 2nd procedure on his back next week was rx'd #90 with no refills on 06/21/09 Shary Decamp  July 17, 2009 12:41 PM   Follow-up for Phone Call        okay to call 90, no RF please remind  patient that only one  doctor  needs to be prescribing his pain medication, do not RF if  Dr Ethelene Hal has Rx meds  Follow-up by: Nolon Rod. Avaleen Brownley MD,  July 17, 2009 2:02 PM    Prescriptions: OXYCODONE-ACETAMINOPHEN 5-325 MG TABS (OXYCODONE-ACETAMINOPHEN) one or two by mouth every 6 hours as needed for pain  #90 x 0   Entered by:   Shary Decamp   Authorized by:   Nolon Rod. Chequita Mofield MD   Signed by:   Shary Decamp on 07/17/2009   Method used:   Print then Mail to Patient   RxID:   [id number]

## 2010-05-20 NOTE — Progress Notes (Signed)
  Phone Note Call from Patient   Caller: Patient Summary of Call: pt called wanted to know if should keep appt on 06/21/09 has had MRI and Ortho  appt with Dr Ethelene Hal yesterday.  Wanted a refill on Oxycodone says taking 3-4 a day Recommend pt to keep appt  on friday  pt agreed .Kandice Hams  June 19, 2009 1:05 PM  Initial call taken by: Kandice Hams,  June 19, 2009 1:05 PM

## 2010-05-20 NOTE — Assessment & Plan Note (Signed)
Summary: discuss med/alr   Vital Signs:  Patient profile:   77 year old male Height:      70.25 inches Weight:      214 pounds BMI:     30.60 Pulse rate:   70 / minute BP sitting:   136 / 80  Vitals Entered By: Shary Decamp (May 22, 2009 9:21 AM) CC: rov, fasting Is Patient Diabetic? No Comments  - discuss teveten, not covered by insurance  - c/o of back pain @ ov 10/10, dx with muscle spasm, no improvement so pt was referred to PT 12/10.  pain is still not improving  - frequent urination (worse @ night), weak stream x several months  - has sudden urge to urinate when he stands up & sometimes is incontinent Shary Decamp  May 22, 2009 9:37 AM    History of Present Illness: several issues  hypertension discuss teveten, not covered by insurance has been taking it  for years, no problems.  He is willing to try another medication   c/o of back pain @ ov 10/10, dx with muscle spasm, no improvement so pt was referred to PT 12/10.  pain is still not improving despite the use of daily Celebrex and Tylenol as needed in the past a muscle relaxant helped pain at this time is located at the lower  right back, some radiation to the right buttock and even to the right thight, worse with certain positions or movements  urinary symptoms of gradual onset for several months frequent urination (worse @ night), weak stream   has sudden urge to urinate when he stands up & sometimes is incontinent  review of systems denies fever no dysuria, gross hematuria or lower abdominal pain  denies bowel incontinence no rash in the back  no ambulatory blood pressures  Current Medications (verified): 1)  Carbidopa-Levodopa Cr 50-200 Mg Tbcr (Carbidopa-Levodopa) .... Take One Tablet 4 Times Daily 2)  Amantadine Hcl 100 Mg Caps (Amantadine Hcl) .... Take One Tablet 4 Times Daily 3)  Fexofenadine Hcl 180 Mg Tabs (Fexofenadine Hcl) .... Take 1 Tablet By Mouth Once A Day 4)  Teveten 600 Mg  Tabs (Eprosartan Mesylate) .... Take One Tablet Once Daily 5)  Fluticasone Propionate 50 Mcg/act Susp (Fluticasone Propionate) .... Spray 2 Spray Into Both Nostrils Once A Day 6)  Lipitor 40 Mg Tabs (Atorvastatin Calcium) .... Take One Tablet Daily 7)  Selegiline Hcl 5 Mg Tabs (Selegiline Hcl) .Marland Kitchen.. 1 Tab By Mouth Two Times A Day.  Prescribed By Neuro 8)  Celebrex 200 Mg Caps (Celecoxib) .Marland Kitchen.. 1 Tab Daily As Needed For Knee Pain 9)  Carbidopa-Levodopa 25-100 Mg Tabs (Carbidopa-Levodopa) .... Take One Tablet As Needed 10)  Bayer Low Strength 81 Mg Tbec (Aspirin) .... Once Daily 11)  Tylenol Arthritis Pain 650 Mg Cr-Tabs (Acetaminophen) .... As Needed  Allergies (verified): No Known Drug Allergies  Past History:  Past Medical History: Hyperlipidemia Hypertension Osteoarthritis PARKINSON'S DISEASE  ERECTILE DYSFUNCTION    Past Surgical History: Reviewed history from 05/13/2007 and no changes required. Right arm fracture: ORIF Left forehead cyst  Social History: Reviewed history from 01/30/2008 and no changes required. Occupation:Retired Married Never Smoked Drug use-no Regular exercise-no  Review of Systems      See HPI  Physical Exam  General:  alert, well-developed, and well-nourished.   Abdomen:  soft, non-tender, no distention, no masses, no guarding, and no rigidity.   Rectal:  external hemorrhoids noted. Normal sphincter tone. No rectal masses or tenderness.  Prostate:  Prostate gland firm and smooth, no enlargement, nodularity,  mass, asymmetry or induration. the prostate seems to be slightly tender Msk:  rotation of the hips normal not tender to palpation of the lower back some antalgic posture when he lays down in the bed for the exam  Extremities:  no edema   Impression & Recommendations:  Problem # 1:  HYPERTENSION (ICD-401.9) switch from tevetan to losartan The following medications were removed from the medication list:    Furosemide 20 Mg Tabs  (Furosemide) .Marland Kitchen... 1 once daily as needed swelling His updated medication list for this problem includes:    Losartan Potassium 50 Mg Tabs (Losartan potassium) ..... One by mouth twice a day  BP today: 136/80 Prior BP: 126/78 (01/09/2009)  Labs Reviewed: K+: 3.9 (01/09/2009) Creat: : 1.0 (01/09/2009)   Chol: 121 (01/09/2009)   HDL: 37.00 (01/09/2009)   LDL: 69 (01/09/2009)   TG: 76.0 (01/09/2009)  Problem # 2:  BACK PAIN (ICD-724.5) continue with back pain despite PT, Celebrex and Tylenol Carisoprodol helped in the past Plan: Prescribe  Carisoprodol again, to be taken at night Vicodin as needed, as long as it is tolerated, watch for constipation since he is also in parkinson meds  if he's not better, ortho  referral His updated medication list for this problem includes:    Celebrex 200 Mg Caps (Celecoxib) .Marland Kitchen... 1 tab daily as needed for knee pain    Carisoprodol 350 Mg Tabs (Carisoprodol) .Marland Kitchen... 1 tab by mouth at bedtime as needed for pain    Bayer Low Strength 81 Mg Tbec (Aspirin) ..... Once daily    Tylenol Arthritis Pain 650 Mg Cr-tabs (Acetaminophen) .Marland Kitchen... As needed    Vicodin Es 7.5-750 Mg Tabs (Hydrocodone-acetaminophen) ..... One twice a day as needed for pain  Problem # 3:  ? of PROSTATITIS, ACUTE (ICD-601.0) symptoms may indicate prostatitis versus BPH PSA on 12/2008 was 2.6 recheck up a PSA UA trial w/ Cipro  Orders: UA Dipstick w/o Micro (manual) (16109) TLB-PSA (Prostate Specific Antigen) (84153-PSA) Specimen Handling (99000) T-Urine Microscopic (60454-09811) T-Culture, Urine ([id number])  Complete Medication List: 1)  Carbidopa-levodopa 25-100 Mg Tabs (Carbidopa-levodopa) .... Take one tablet as needed 2)  Carbidopa-levodopa Cr 50-200 Mg Tbcr (Carbidopa-levodopa) .... Take one tablet 4 times daily 3)  Amantadine Hcl 100 Mg Caps (Amantadine hcl) .... Take one tablet 4 times daily 4)  Selegiline Hcl 5 Mg Tabs (Selegiline hcl) .Marland Kitchen.. 1 tab by mouth two times a day.   prescribed by neuro 5)  Fexofenadine Hcl 180 Mg Tabs (Fexofenadine hcl) .... Take 1 tablet by mouth once a day 6)  Losartan Potassium 50 Mg Tabs (Losartan potassium) .... One by mouth twice a day 7)  Fluticasone Propionate 50 Mcg/act Susp (Fluticasone propionate) .... Spray 2 spray into both nostrils once a day 8)  Lipitor 40 Mg Tabs (Atorvastatin calcium) .... Take one tablet daily 9)  Celebrex 200 Mg Caps (Celecoxib) .Marland Kitchen.. 1 tab daily as needed for knee pain 10)  Carisoprodol 350 Mg Tabs (Carisoprodol) .Marland Kitchen.. 1 tab by mouth at bedtime as needed for pain 11)  Bayer Low Strength 81 Mg Tbec (Aspirin) .... Once daily 12)  Tylenol Arthritis Pain 650 Mg Cr-tabs (Acetaminophen) .... As needed 13)  Cipro 500 Mg Tabs (Ciprofloxacin hcl) .... One by mouth twice a day 14)  Vicodin Es 7.5-750 Mg Tabs (Hydrocodone-acetaminophen) .... One twice a day as needed for pain  Patient Instructions: 1)  Please schedule a follow-up appointment in 1 month.  2)  use Celebrex daily, Tylenol as needed 3)  okay to use a muscle relaxant at bedtime 4)  if the pain is severe, take Vicodin instead of Tylenol.  Watch for constipation  or other  side effects Prescriptions: VICODIN ES 7.5-750 MG TABS (HYDROCODONE-ACETAMINOPHEN) one twice a day as needed for pain  #60 x 0   Entered and Authorized by:   Nolon Rod. Talitha Dicarlo MD   Signed by:   Nolon Rod. Ott Zimmerle MD on 05/22/2009   Method used:   Print then Give to Patient   RxID:   [id number] CARISOPRODOL 350 MG TABS (CARISOPRODOL) 1 tab by mouth at bedtime as needed for pain  #30 x 1   Entered and Authorized by:   Nolon Rod. Demba Nigh MD   Signed by:   Nolon Rod. Amandeep Hogston MD on 05/22/2009   Method used:   Electronically to        [facility] (retail)       [address]       [place], [place]  [address]       Ph: [phone]       Fax: [phone]   RxID:   [id number] CIPRO 500 MG TABS (CIPROFLOXACIN HCL) one by mouth twice a day  #20 x 0   Entered and  Authorized by:   Nolon Rod. Annalina Needles MD   Signed by:   Nolon Rod. Lindsy Cerullo MD on 05/22/2009   Method used:   Electronically to        [facility] (retail)       [address]       [place], [place]  [address]       Ph: [phone]       Fax: [phone]   RxID:   [id number] LOSARTAN POTASSIUM 50 MG TABS (LOSARTAN POTASSIUM) one by mouth twice a day  #60 x 3   Entered and Authorized by:   Nolon Rod. Ibraham Levi MD   Signed by:   Nolon Rod. Rosiland Sen MD on 05/22/2009   Method used:   Electronically to        [facility] (retail)       [address]       [place], [place]  [address]       Ph: [phone]       Fax: [phone]   RxID:   [id number]   Laboratory Results   Urine Tests    Routine Urinalysis   Color: amber Appearance: Clear Glucose: negative   (Normal Range: Negative) Bilirubin: large   (Normal Range: Negative) Ketone: small (15)   (Normal Range: Negative) Spec. Gravity: >=1.030   (Normal Range: 1.003-1.035) Blood: negative   (Normal Range: Negative) pH: 5.0   (Normal Range: 5.0-8.0) Protein: 100   (Normal Range: Negative) Urobilinogen: 0.2   (Normal Range: 0-1) Nitrite: negative   (Normal Range: Negative) Leukocyte Esterace: trace   (Normal Range: Negative)

## 2010-05-20 NOTE — Progress Notes (Signed)
Summary: labs  Phone Note Outgoing Call   Call placed by: Doristine Devoid CMA,  December 04, 2009 3:14 PM Call placed to: Patient Summary of Call: no evidence of peptic ulcer.  should take the Omeprazole and miralax as previously discussed   Follow-up for Phone Call        left message on machine ..........Marland KitchenDoristine Devoid CMA  December 04, 2009 3:14 PM   spoke w/ patient aware of labs.....Marland KitchenMarland KitchenDoristine Devoid CMA  December 05, 2009 11:31 AM

## 2010-05-20 NOTE — Miscellaneous (Signed)
Summary: PT Initial Summary/[facility]  PT Initial Summary/[facility]   Imported By: Lanelle Bal 05/01/2009 15:14:41  _____________________________________________________________________  External Attachment:    Type:   Image     Comment:   External Document

## 2010-05-20 NOTE — Consult Note (Signed)
Summary: [facility]  [facility]   Imported By: Lanelle Bal 11/04/2009 12:31:54  _____________________________________________________________________  External Attachment:    Type:   Image     Comment:   External Document

## 2010-05-20 NOTE — Progress Notes (Signed)
Summary: celebrex refill   Phone Note Refill Request Message from:  Fax from Pharmacy on [facility] FAX [phone]  Refills Requested: Medication #1:  CELEBREX 200 MG CAPS 1 tab daily as needed for knee pain Initial call taken by: Barb Merino,  May 20, 2009 2:58 PM    Prescriptions: CELEBREX 200 MG CAPS (CELECOXIB) 1 tab daily as needed for knee pain  #30 x 3   Entered by:   Doristine Devoid   Authorized by:   Neena Rhymes MD   Signed by:   Doristine Devoid on 05/20/2009   Method used:   Electronically to        [facility] (retail)       [address]       [place], [place]  [address]       Ph: [phone]       Fax: [phone]   RxID:   [id number]

## 2010-05-20 NOTE — Miscellaneous (Signed)
Summary: PT Discharge/[facility]  PT Discharge/[facility]   Imported By: Lanelle Bal 06/07/2009 13:58:26  _____________________________________________________________________  External Attachment:    Type:   Image     Comment:   External Document

## 2010-05-20 NOTE — Letter (Signed)
Summary: Rental Agreement for TENS Device/Medical Modalities  Rental Agreement for TENS Device/Medical Modalities   Imported By: Lanelle Bal 07/05/2009 10:11:27  _____________________________________________________________________  External Attachment:    Type:   Image     Comment:   External Document

## 2010-05-20 NOTE — Progress Notes (Signed)
Summary: fexofenadine refill   Phone Note Refill Request Call back at [phone] Message from:  Fax from Pharmacy on Sep 06, 2009 10:25 AM  Refills Requested: Medication #1:  FEXOFENADINE HCL 180 MG TABS Take 1 tablet by mouth once a day   Dosage confirmed as above?Dosage Confirmed   Supply Requested: 1 month   Last Refilled: 12/20/2008 [facility] [address]  Next Appointment Scheduled: 8.10.11 Initial call taken by: Harold Barban,  Sep 06, 2009 10:25 AM    Prescriptions: FEXOFENADINE HCL 180 MG TABS (FEXOFENADINE HCL) Take 1 tablet by mouth once a day  #34 Tablet x 6   Entered by:   Doristine Devoid   Authorized by:   Neena Rhymes MD   Signed by:   Doristine Devoid on 09/06/2009   Method used:   Electronically to        [facility] (retail)       [address]       [place], [place]  [address]       Ph: [phone]       Fax: [phone]   RxID:   [id number]

## 2010-05-20 NOTE — Progress Notes (Signed)
Summary: questions about meds   Phone Note Call from Patient   Caller: Patient Summary of Call: patient left msg on voicemail wondering how should he take oxycodone w/ meds. Initial call taken by: Doristine Devoid,  June 12, 2009 3:33 PM  Follow-up for Phone Call        spoke w/ patient was given prescription for oxycodone informed patient that he should no longer be taken vicodin nor should he take tylenol w/ oxycodone informed that if he needed tylenol during the day thats fine and if no relief in 4-6 hours then he may take oxycodone but not the two together pt verbalized understanding .....Marland KitchenMarland KitchenDoristine Devoid  June 12, 2009 3:35 PM

## 2010-05-20 NOTE — Progress Notes (Signed)
Summary: pain  Phone Note Call from Patient   Summary of Call: [facility]:  Vicodin 7.5/750 - two times a day Celebrex 200mg  - qam Tyl Arth - qpm Carisoprodolol 350 qhs  Not relieving back pain.  Has MRI scheduled for tomorrow.  Has urology appt 3/16.  ROV with Dr Drue Novel 3/4.  Patient requesting something else for pain Shary Decamp  June 11, 2009 4:49 PM    Follow-up for Phone Call        switch from hydrocodone to oxycodone, see prescription, ER if symptoms severe Sandee Bernath E. Sankalp Ferrell MD  June 11, 2009 4:59 PM   discussed with pt -- Shary Decamp  June 11, 2009 5:01 PM     New/Updated Medications: OXYCODONE-ACETAMINOPHEN 5-325 MG TABS (OXYCODONE-ACETAMINOPHEN) one or two by mouth every 6 hours as needed for pain Prescriptions: OXYCODONE-ACETAMINOPHEN 5-325 MG TABS (OXYCODONE-ACETAMINOPHEN) one or two by mouth every 6 hours as needed for pain  #40 x 0   Entered and Authorized by:   Nolon Rod. Keana Dueitt MD   Signed by:   Nolon Rod. Pinchas Reither MD on 06/11/2009   Method used:   Print then Give to Patient   RxID:   [id number]

## 2010-05-20 NOTE — Assessment & Plan Note (Signed)
Summary: pain in left lower side//lch   Vital Signs:  Patient profile:   77 year old male Weight:      213.05 pounds BMI:     30.46 Pulse rate:   92 / minute Pulse rhythm:   r140 Resp:     16 per minute BP sitting:   140 / 80  (left arm) Cuff size:   large  Vitals Entered By: Pearletha Furl CMA (June 11, 2009 11:34 AM) CC: room 14  Pain in left lower back for 2 months. Is Patient Diabetic? No Comments Pain is increasing. PT seemed to help temporarily. Now has to use cane to ambulate.   Primary Care Provider:  Neena Rhymes MD  CC:  room 14  Pain in left lower back for 2 months..  History of Present Illness: [name] is a 77 year old male who presets with c/o right low back pain which travels down the right buttock and radiates around the right anterior thigh.  He compares this pain to the pain that he had 20 years ago that was associated with a kidney stone.  He did try a muscle relaxer back in september for back pain with minimal pain.  Around Christmas he noted back pain became more bothersome.  He has used carisoprodol.  [facility] at [facility] in the end of December.  Notes that he tried stretching, electrical stimulation- noted very temporary improvement, but no significant improvement.  He was discharged from PT.  Saw Dr Drue Novel on 2/2- was treated with cipro, vicodin, and carisprodol.  He completed cipro without improvement.  Notes that the last few days he is in "the worst shape ever" despite using vicodin and carisoprodol, now needs cane to ambulate.  Feels that this pain is beginning to become debilitating.    Last visit he was noted to have an elevated PSA.  Notes that he often has nocturia.  He has sudden urge to urinate, stream starts "a little slower than it used to."  Allergies (verified): No Known Drug Allergies  Physical Exam  General:  Well-developed,well-nourished,in no acute distress; alert,appropriate and cooperative throughout examination Lungs:  Normal respiratory  effort, chest expands symmetrically. Lungs are clear to auscultation, no crackles or wheezes. Heart:  Normal rate and regular rhythm. S1 and S2 normal without gallop, murmur, click, rub or other extra sounds. Msk:  able to perform right straight leg raise, however this increases patient's low back pain.   Neurologic:  alert & oriented X3, strength normal in all extremities, and DTRs symmetrical and normal in bilateral lower extremities.     Impression & Recommendations:  Problem # 1:  BACK PAIN (ICD-724.5) Assessment Deteriorated Patient has failed conservative treatment with NSAIDS and PT.  Will order MRI of the LS spine for further evaluation.  I am concerned about the possibility of prostate CA with mets as part of differential given deterioration of symptoms and recent elevated PSA which did not respond to antibiotics.   His updated medication list for this problem includes:    Celebrex 200 Mg Caps (Celecoxib) .Marland Kitchen... 1 tab daily as needed for knee pain    Carisoprodol 350 Mg Tabs (Carisoprodol) .Marland Kitchen... 1 tab by mouth at bedtime as needed for pain    Bayer Low Strength 81 Mg Tbec (Aspirin) ..... Once daily    Tylenol Arthritis Pain 650 Mg Cr-tabs (Acetaminophen) .Marland Kitchen... As needed    Vicodin Es 7.5-750 Mg Tabs (Hydrocodone-acetaminophen) ..... One twice a day as needed for pain  Aleve 220 Mg Tabs (Naproxen sodium) .Marland Kitchen... Take 1 tablet by mouth two times a day  Orders: Misc. Referral (Misc. Ref)  Problem # 2:  PSA, INCREASED (ICD-790.93) Will refer to urology- see above discussion. Orders: Urology Referral (Urology)  Complete Medication List: 1)  Carbidopa-levodopa 25-100 Mg Tabs (Carbidopa-levodopa) .... Take one tablet as needed 2)  Carbidopa-levodopa Cr 50-200 Mg Tbcr (Carbidopa-levodopa) .... Take one tablet 4 times daily 3)  Amantadine Hcl 100 Mg Caps (Amantadine hcl) .... Take one tablet 4 times daily 4)  Selegiline Hcl 5 Mg Tabs (Selegiline hcl) .Marland Kitchen.. 1 tab by mouth two times a day.   prescribed by neuro 5)  Fexofenadine Hcl 180 Mg Tabs (Fexofenadine hcl) .... Take 1 tablet by mouth once a day 6)  Losartan Potassium 50 Mg Tabs (Losartan potassium) .... One by mouth twice a day 7)  Fluticasone Propionate 50 Mcg/act Susp (Fluticasone propionate) .... Spray 2 spray into both nostrils once a day 8)  Lipitor 40 Mg Tabs (Atorvastatin calcium) .... Take one tablet daily 9)  Celebrex 200 Mg Caps (Celecoxib) .Marland Kitchen.. 1 tab daily as needed for knee pain 10)  Carisoprodol 350 Mg Tabs (Carisoprodol) .Marland Kitchen.. 1 tab by mouth at bedtime as needed for pain 11)  Bayer Low Strength 81 Mg Tbec (Aspirin) .... Once daily 12)  Tylenol Arthritis Pain 650 Mg Cr-tabs (Acetaminophen) .... As needed 13)  Vicodin Es 7.5-750 Mg Tabs (Hydrocodone-acetaminophen) .... One twice a day as needed for pain 14)  Aleve 220 Mg Tabs (Naproxen sodium) .... Take 1 tablet by mouth two times a day  Patient Instructions: 1)  You will be called about your MRI, and your referral to Urology. 2)  Please schedule a follow-up appointment in 2 weeks.  Current Allergies (reviewed today): No known allergies

## 2010-05-20 NOTE — Consult Note (Signed)
Summary: [facility]  [facility]   Imported By: Lanelle Bal 12/24/2009 10:47:23  _____________________________________________________________________  External Attachment:    Type:   Image     Comment:   External Document

## 2010-05-20 NOTE — Letter (Signed)
Summary: CMN for TENS Unit/Medical Modalities  CMN for TENS Unit/Medical Modalities   Imported By: Lanelle Bal 07/19/2009 10:19:40  _____________________________________________________________________  External Attachment:    Type:   Image     Comment:   External Document

## 2010-05-20 NOTE — Consult Note (Signed)
Summary: [facility]  [facility]   Imported By: Lanelle Bal 09/02/2009 08:26:53  _____________________________________________________________________  External Attachment:    Type:   Image     Comment:   External Document

## 2010-05-20 NOTE — Assessment & Plan Note (Signed)
Summary: RECHECK WEIGHT/CBS   Vital Signs:  Patient profile:   77 year old male Height:      70.25 inches (178.44 cm) Weight:      203.25 pounds (92.39 kg) BMI:     29.06 Temp:     97.5 degrees F (36.39 degrees C) oral BP sitting:   140 / 76  (left arm) Cuff size:   large  Vitals Entered By: Lucious Groves CMA (December 03, 2009 9:20 AM) CC: Re-check weight./kb Is Patient Diabetic? No Pain Assessment Patient in pain? yes      Comments Patient would also like to discuss back issues, stomach issues, increased BP, and pain management./kb   History of Present Illness: 77 yo man here today for f/u  1) Weight loss- weight is stable.  reports when he feels poorly he doesn't eat.  when he feels well his appetite returns.  2) Back pain- had myelogram w/ Dr Flo Shanks on 7/28.  s/p 3 SI/epidural injxns- relief only lasted 1 day.  surgery is an option, as is stimulator.  is to continue wearing SI belt.  having trouble getting pain meds from Dr Ethelene Hal' office- has appt on 8/25.  3) Stomach issue- 'always upset'.  constipation from pain meds or diarrhea from laxative.  having increased sxs due to lumbar belt.  not on PPI.  4) HTN- pt's BP has been elevated when in pain.  pt concerned about this.  5) Depression- pt admits that depression is playing a large role in his sxs.  has not discussed this w/ anyone.  has upcoming appt w/ neuro re: parkinson's.  mood is affecting sleep, appetite, perception of pain, bowels.  daughter feels this is pt's biggest issue currently.  Current Medications (verified): 1)  Carbidopa-Levodopa 25-100 Mg Tabs (Carbidopa-Levodopa) .... Take One Tablet As Needed 2)  Carbidopa-Levodopa Cr 50-200 Mg Tbcr (Carbidopa-Levodopa) .... Take One Tablet 4 Times Daily 3)  Amantadine Hcl 100 Mg Caps (Amantadine Hcl) .... Take One Tablet 4 Times Daily 4)  Selegiline Hcl 5 Mg Tabs (Selegiline Hcl) .Marland Kitchen.. 1 Tab By Mouth Two Times A Day.  Prescribed By Neuro 5)  Fexofenadine Hcl 180 Mg Tabs  (Fexofenadine Hcl) .... Take 1 Tablet By Mouth Once A Day 6)  Losartan Potassium 50 Mg Tabs (Losartan Potassium) .... One By Mouth Twice A Day 7)  Fluticasone Propionate 50 Mcg/act Susp (Fluticasone Propionate) .... Spray 2 Spray Into Both Nostrils Once A Day 8)  Lipitor 40 Mg Tabs (Atorvastatin Calcium) .... Take One Tablet Daily 9)  Bayer Low Strength 81 Mg Tbec (Aspirin) .... Once Daily 10)  Oxycodone-Acetaminophen 10-325 Mg Tabs (Oxycodone-Acetaminophen) .Marland Kitchen.. 1-2 By Mouth Q 6h As Needed For Pain 11)  Celebrex 200 Mg Caps (Celecoxib) .... Take One Tablet Daily  Allergies (verified): No Known Drug Allergies  Past History:  Past Medical History: Last updated: 05/22/2009 Hyperlipidemia Hypertension Osteoarthritis PARKINSON'S DISEASE  ERECTILE DYSFUNCTION    Social History: Occupation:Retired Married, daughter in college Never Smoked Drug use-no Regular exercise-no  Review of Systems      See HPI  Physical Exam  General:  alert and well-developed.   Head:  Normocephalic and atraumatic without obvious abnormalities. No apparent alopecia or balding. Lungs:  Normal respiratory effort, chest expands symmetrically. Lungs are clear to auscultation, no crackles or wheezes. Heart:  Normal rate and regular rhythm. S1 and S2 normal without gallop, murmur, click, rub or other extra sounds. Abdomen:  soft, non-tender, no distention, no masses, no guarding, and no rigidity.  Pulses:  +2 carotid, radial, DP Extremities:  no edema Psych:  obviously upset, slow speech, poor eye contact, withdrawn   Impression & Recommendations:  Problem # 1:  WEIGHT LOSS (ICD-783.21) Assessment Unchanged pt's weight stable.  appetite fluctuates w/ pain level, depression.  Problem # 2:  DEPRESSIVE DISORDER (ICD-311) Assessment: Unchanged pt and daughter feel depression is a big issue for pt currently.  strongly recommended he speak w/ neurologist re: SSRI as I don't want to start something that will  interefere w/ pt's parkinson's meds.  will follow.  Problem # 3:  GERD (ICD-530.81) Assessment: New given pt's stomach sxs will check for PUD.  start PPI.  pt to start miralax rather than stimulant laxative. His updated medication list for this problem includes:    Omeprazole 40 Mg Cpdr (Omeprazole) .Marland Kitchen... 1 tab by mouth daily  Orders: Specimen Handling ([address]) TLB-H. Pylori Abs(Helicobacter Pylori) (86677-HELICO)  Problem # 4:  HYPERTENSION (ICD-401.9) Assessment: Unchanged adequate control in office today.  reviewed that BP elevation while in pain is normal physiologic response.  no need for med changes at this time but will follow closely. His updated medication list for this problem includes:    Losartan Potassium 50 Mg Tabs (Losartan potassium) ..... One by mouth twice a day  Problem # 5:  BACK PAIN (ICD-724.5) Assessment: Unchanged pt having hard time getting medication from Dr Ethelene Hal' office.  will provide pain meds as needed. His updated medication list for this problem includes:    Bayer Low Strength 81 Mg Tbec (Aspirin) ..... Once daily    Oxycodone-acetaminophen 10-325 Mg Tabs (Oxycodone-acetaminophen) .Marland Kitchen... 1-2 by mouth q 6h as needed for pain    Celebrex 200 Mg Caps (Celecoxib) .Marland Kitchen... Take one tablet daily  Complete Medication List: 1)  Carbidopa-levodopa 25-100 Mg Tabs (Carbidopa-levodopa) .... Take one tablet as needed 2)  Carbidopa-levodopa Cr 50-200 Mg Tbcr (Carbidopa-levodopa) .... Take one tablet 4 times daily 3)  Amantadine Hcl 100 Mg Caps (Amantadine hcl) .... Take one tablet 4 times daily 4)  Selegiline Hcl 5 Mg Tabs (Selegiline hcl) .Marland Kitchen.. 1 tab by mouth two times a day.  prescribed by neuro 5)  Fexofenadine Hcl 180 Mg Tabs (Fexofenadine hcl) .... Take 1 tablet by mouth once a day 6)  Losartan Potassium 50 Mg Tabs (Losartan potassium) .... One by mouth twice a day 7)  Fluticasone Propionate 50 Mcg/act Susp (Fluticasone propionate) .... Spray 2 spray into both  nostrils once a day 8)  Lipitor 40 Mg Tabs (Atorvastatin calcium) .... Take one tablet daily 9)  Bayer Low Strength 81 Mg Tbec (Aspirin) .... Once daily 10)  Oxycodone-acetaminophen 10-325 Mg Tabs (Oxycodone-acetaminophen) .Marland Kitchen.. 1-2 by mouth q 6h as needed for pain 11)  Celebrex 200 Mg Caps (Celecoxib) .... Take one tablet daily 12)  Omeprazole 40 Mg Cpdr (Omeprazole) .Marland Kitchen.. 1 tab by mouth daily  Patient Instructions: 1)  Please schedule your complete physical after 9/22- do not eat before this appt 2)  Start Miralax daily for bowel regularity 3)  Start the Omeprazole 40mg  daily for acid production 4)  Continue the oxycodone as needed for pain 5)  Please ask Dr Rubin Payor about your depression and starting something for it 6)  Hang in there!! Prescriptions: OXYCODONE-ACETAMINOPHEN 10-325 MG TABS (OXYCODONE-ACETAMINOPHEN) 1-2 by mouth q 6h as needed for pain  #120 x 0   Entered and Authorized by:   Neena Rhymes MD   Signed by:   Neena Rhymes MD on 12/03/2009   Method used:  Print then Give to Patient   RxID:   [id number] OMEPRAZOLE 40 MG CPDR (OMEPRAZOLE) 1 tab by mouth daily  #30 x 3   Entered and Authorized by:   Neena Rhymes MD   Signed by:   Neena Rhymes MD on 12/03/2009   Method used:   Electronically to        [facility] (retail)       [address]       [place], [place]  [address]       Ph: [phone]       Fax: [phone]   RxID:   [id number]

## 2010-05-20 NOTE — Consult Note (Signed)
Summary: [facility]  [facility]   Imported By: Lanelle Bal 12/13/2009 09:34:54  _____________________________________________________________________  External Attachment:    Type:   Image     Comment:   External Document

## 2010-05-20 NOTE — Progress Notes (Signed)
Summary: back pain  Phone Note Call from Patient   Caller: Patient Summary of Call: pt called having severe back pain, has neurosurgeon appt tomorrow Per Dr Beverely Low, recommend ED  today, pt agreed going to [facility] .Kandice Hams  September 23, 2009 3:02 PM  Initial call taken by: Kandice Hams,  September 23, 2009 3:02 PM

## 2010-05-22 NOTE — Progress Notes (Signed)
Summary: Celebrex refill  Phone Note Refill Request Message from:  Fax from Pharmacy on April 04, 2010 11:57 AM  Refills Requested: Medication #1:  CELEBREX 200 MG CAPS take one tablet daily   Last Refilled: 03/03/2010 [id number], [address] [place], [place]  phone- [phone], fax [phone]   qty = 30  Next Appointment Scheduled: none Initial call taken by: Jerolyn Shin,  April 04, 2010 12:00 PM    Prescriptions: CELEBREX 200 MG CAPS (CELECOXIB) take one tablet daily  #30 x 6   Entered by:   Doristine Devoid CMA   Authorized by:   Neena Rhymes MD   Signed by:   Doristine Devoid CMA on 04/04/2010   Method used:   Electronically to        [facility] (retail)       [address]       [place], [place]  [address]       Ph: [phone]       Fax: [phone]   RxID:   [id number]

## 2010-05-22 NOTE — Consult Note (Signed)
Summary: [facility]  [facility]   Imported By: Lanelle Bal 04/15/2010 10:12:15  _____________________________________________________________________  External Attachment:    Type:   Image     Comment:   External Document

## 2010-05-22 NOTE — Progress Notes (Signed)
Summary: Refill Request  Phone Note Refill Request Call back at [phone] Message from:  Pharmacy on April 22, 2010 11:20 AM  Refills Requested: Medication #1:  CELEBREX 200 MG CAPS take one tablet daily   Dosage confirmed as above?Dosage Confirmed   Supply Requested: 1 month   Last Refilled: 03/03/2010 [facility] [address]  Next Appointment Scheduled: none Initial call taken by: Harold Barban,  April 22, 2010 11:20 AM    Prescriptions: CELEBREX 200 MG CAPS (CELECOXIB) take one tablet daily  #30 x 6   Entered by:   Doristine Devoid CMA   Authorized by:   Neena Rhymes MD   Signed by:   Doristine Devoid CMA on 04/22/2010   Method used:   Electronically to        [facility] (retail)       [address]       [place], [place]  [address]       Ph: [phone]       Fax: [phone]   RxID:   [id number]

## 2010-05-26 ENCOUNTER — Ambulatory Visit: Payer: Medicare Other | Attending: Neurology | Admitting: Occupational Therapy

## 2010-05-26 ENCOUNTER — Ambulatory Visit: Payer: Medicare Other | Admitting: Physical Therapy

## 2010-05-26 DIAGNOSIS — G20A1 Parkinson's disease without dyskinesia, without mention of fluctuations: Secondary | ICD-10-CM | POA: Insufficient documentation

## 2010-05-26 DIAGNOSIS — IMO0001 Reserved for inherently not codable concepts without codable children: Secondary | ICD-10-CM | POA: Insufficient documentation

## 2010-05-26 DIAGNOSIS — R269 Unspecified abnormalities of gait and mobility: Secondary | ICD-10-CM | POA: Insufficient documentation

## 2010-05-26 DIAGNOSIS — G20 Parkinson's disease: Secondary | ICD-10-CM | POA: Insufficient documentation

## 2010-05-27 ENCOUNTER — Telehealth (INDEPENDENT_AMBULATORY_CARE_PROVIDER_SITE_OTHER): Payer: Self-pay | Admitting: *Deleted

## 2010-05-29 ENCOUNTER — Ambulatory Visit: Payer: Medicare Other | Admitting: Occupational Therapy

## 2010-05-29 ENCOUNTER — Ambulatory Visit: Payer: Medicare Other | Admitting: Physical Therapy

## 2010-06-02 ENCOUNTER — Ambulatory Visit: Payer: Medicare Other | Admitting: Occupational Therapy

## 2010-06-02 ENCOUNTER — Ambulatory Visit: Payer: Medicare Other | Admitting: Physical Therapy

## 2010-06-04 ENCOUNTER — Telehealth (INDEPENDENT_AMBULATORY_CARE_PROVIDER_SITE_OTHER): Payer: Self-pay | Admitting: *Deleted

## 2010-06-05 ENCOUNTER — Ambulatory Visit: Payer: Medicare Other | Admitting: Occupational Therapy

## 2010-06-05 ENCOUNTER — Ambulatory Visit: Payer: Medicare Other | Admitting: Physical Therapy

## 2010-06-05 NOTE — Progress Notes (Signed)
Summary: Refill Request  Phone Note Refill Request Call back at [phone] Message from:  Pharmacy on May 27, 2010 10:47 AM  Refills Requested: Medication #1:  FLUTICASONE PROPIONATE 50 MCG/ACT SUSP Spray 2 spray into both nostrils once a day   Dosage confirmed as above?Dosage Confirmed   Supply Requested: 16   Last Refilled: 03/03/2010 [facility] [address]  Next Appointment Scheduled: none Initial call taken by: Harold Barban,  May 27, 2010 10:47 AM    Prescriptions: FLUTICASONE PROPIONATE 50 MCG/ACT SUSP (FLUTICASONE PROPIONATE) Spray 2 spray into both nostrils once a day  #1 x 3   Entered by:   Doristine Devoid CMA   Authorized by:   Neena Rhymes MD   Signed by:   Doristine Devoid CMA on 05/27/2010   Method used:   Electronically to        [facility] (retail)       [address]       [place], [place]  [address]       Ph: [phone]       Fax: [phone]   RxID:   [id number]

## 2010-06-09 ENCOUNTER — Encounter: Payer: Self-pay | Admitting: Occupational Therapy

## 2010-06-09 ENCOUNTER — Ambulatory Visit: Payer: Self-pay | Admitting: Physical Therapy

## 2010-06-11 ENCOUNTER — Ambulatory Visit: Payer: Medicare Other | Admitting: Occupational Therapy

## 2010-06-11 ENCOUNTER — Ambulatory Visit: Payer: Medicare Other | Admitting: Physical Therapy

## 2010-06-11 NOTE — Progress Notes (Signed)
Summary: Atorvastatin refill  Phone Note Refill Request Message from:  Fax from Pharmacy on June 04, 2010 3:11 PM  Refills Requested: Medication #1:  LIPITOR 40 MG TABS Take one tablet daily [id number], [address] [place], [place]  phone- [phone], fax [phone]   qty = 30  Next Appointment Scheduled: none Initial call taken by: Jerolyn Shin,  June 04, 2010 3:12 PM    Prescriptions: LIPITOR 40 MG TABS (ATORVASTATIN CALCIUM) Take one tablet daily  #30 x 6   Entered by:   Doristine Devoid CMA   Authorized by:   Neena Rhymes MD   Signed by:   Doristine Devoid CMA on 06/04/2010   Method used:   Electronically to        [facility] (retail)       [address]       [place], [place]  [address]       Ph: [phone]       Fax: [phone]   RxID:   [id number]

## 2010-06-16 ENCOUNTER — Ambulatory Visit: Payer: Medicare Other | Admitting: Physical Therapy

## 2010-06-16 ENCOUNTER — Ambulatory Visit: Payer: Medicare Other | Admitting: Occupational Therapy

## 2010-06-19 ENCOUNTER — Ambulatory Visit: Payer: Medicare Other | Attending: Family Medicine | Admitting: Physical Therapy

## 2010-06-19 ENCOUNTER — Ambulatory Visit: Payer: Medicare Other | Admitting: Occupational Therapy

## 2010-06-19 DIAGNOSIS — G20A1 Parkinson's disease without dyskinesia, without mention of fluctuations: Secondary | ICD-10-CM | POA: Insufficient documentation

## 2010-06-19 DIAGNOSIS — G20 Parkinson's disease: Secondary | ICD-10-CM | POA: Insufficient documentation

## 2010-06-19 DIAGNOSIS — R269 Unspecified abnormalities of gait and mobility: Secondary | ICD-10-CM | POA: Insufficient documentation

## 2010-06-19 DIAGNOSIS — IMO0001 Reserved for inherently not codable concepts without codable children: Secondary | ICD-10-CM | POA: Insufficient documentation

## 2010-06-23 ENCOUNTER — Ambulatory Visit: Payer: Medicare Other | Admitting: Occupational Therapy

## 2010-06-23 ENCOUNTER — Ambulatory Visit: Payer: Medicare Other | Admitting: Physical Therapy

## 2010-06-24 ENCOUNTER — Telehealth: Payer: Self-pay | Admitting: Family Medicine

## 2010-06-25 ENCOUNTER — Encounter: Payer: Medicare Other | Admitting: Occupational Therapy

## 2010-06-25 ENCOUNTER — Ambulatory Visit: Payer: Medicare Other | Admitting: Physical Therapy

## 2010-06-30 ENCOUNTER — Encounter: Payer: Medicare Other | Admitting: Occupational Therapy

## 2010-06-30 ENCOUNTER — Ambulatory Visit: Payer: Medicare Other | Admitting: Physical Therapy

## 2010-06-30 ENCOUNTER — Ambulatory Visit: Payer: Medicare Other | Admitting: Occupational Therapy

## 2010-07-01 NOTE — Progress Notes (Signed)
Summary: Refill--Omeprazole  Phone Note Refill Request Message from:  Fax from Pharmacy on June 24, 2010 2:27 PM  Refills Requested: Medication #1:  OMEPRAZOLE 40 MG CPDR 1 tab by mouth daily [facility] - fax [phone]  Initial call taken by: Okey Regal Spring,  June 24, 2010 2:27 PM    Prescriptions: OMEPRAZOLE 40 MG CPDR (OMEPRAZOLE) 1 tab by mouth daily  #30 x 3   Entered by:   Lucious Groves CMA   Authorized by:   Neena Rhymes MD   Signed by:   Lucious Groves CMA on 06/25/2010   Method used:   Electronically to        [facility] (retail)       [address]       [place], [place]  [address]       Ph: [phone]       Fax: [phone]   RxID:   [id number]

## 2010-07-02 ENCOUNTER — Ambulatory Visit: Payer: Medicare Other | Admitting: Physical Therapy

## 2010-07-02 ENCOUNTER — Ambulatory Visit: Payer: Medicare Other | Admitting: Occupational Therapy

## 2010-07-02 ENCOUNTER — Encounter: Payer: Medicare Other | Admitting: Occupational Therapy

## 2010-07-07 ENCOUNTER — Encounter: Payer: Medicare Other | Admitting: Occupational Therapy

## 2010-07-07 ENCOUNTER — Ambulatory Visit: Payer: Medicare Other | Admitting: Physical Therapy

## 2010-07-09 ENCOUNTER — Ambulatory Visit: Payer: Medicare Other | Admitting: Physical Therapy

## 2010-07-09 ENCOUNTER — Encounter: Payer: Medicare Other | Admitting: Occupational Therapy

## 2010-07-25 ENCOUNTER — Other Ambulatory Visit: Payer: Self-pay | Admitting: Internal Medicine

## 2010-07-28 ENCOUNTER — Other Ambulatory Visit: Payer: Self-pay | Admitting: *Deleted

## 2010-08-25 ENCOUNTER — Other Ambulatory Visit: Payer: Self-pay | Admitting: Family Medicine

## 2010-08-29 ENCOUNTER — Encounter: Payer: Self-pay | Admitting: Family Medicine

## 2010-09-05 NOTE — Op Note (Signed)
   NAME:  [name], [name] NO.:  [id number]   MEDICAL RECORD NO.:  [id number]                   PATIENT TYPE:  AMB   LOCATION:  ENDO                                 FACILITY:  [facility]   PHYSICIAN:  Charna Elizabeth, M.D.                   DATE OF BIRTH:  1933-07-12   DATE OF PROCEDURE:  11/30/2001  DATE OF DISCHARGE:                                 OPERATIVE REPORT   PROCEDURE:  Colonoscopy with snare polypectomy x1.   ENDOSCOPIST:  Charna Elizabeth, M.D.   INSTRUMENT USED:  Olympus video colonoscope.   INDICATIONS:  A 77 year old white male undergoing screening colonoscopy.  The patient has a personal history of adenomatous polyps removed in the  past.   PREPROCEDURE PREPARATION:  Informed consent was procured from the patient.  The patient was fasted for eight hours prior to the procedure and prepped  with a bottle of magnesium citrate and a gallon of NuLytely the night prior  to the procedure.   PREPROCEDURE PHYSICAL:  VITAL SIGNS:  The patient had stable vital signs.  NECK:  Supple.  CHEST:  Clear to auscultation.  S1 and S2 regular.  ABDOMEN:  Soft with normal bowel sounds.   DESCRIPTION OF PROCEDURE:  The patient was placed in the left lateral  decubitus position and sedated with 60 mg of Demerol and 6 mg of Versed  intravenously.  Once the patient was adequately sedate and maintained on low-  flow oxygen and continuous cardiac monitoring, the Olympus video colonoscope  was advanced from the rectum to the cecum without difficulty.  A small  sessile polyp was removed by snare polypectomy forceps at 10 cm.  Small,  nonbleeding internal hemorrhoids were seen on retroflexion.  The rest of the  colonic mucosa up to the cecum appeared normal.  The appendiceal orifice and  the ileocecal valve were clearly visualized and photographed.   IMPRESSION:  Normal-appearing colon except for a small sessile polyp removed  at 10 cm and small, nonbleeding internal  hemorrhoids.   RECOMMENDATIONS:  1. Await pathology results.  2. Avoid all nonsteroidals, including aspirin, for the next two weeks.  3. Outpatient follow-up in the next two weeks.                                               Charna Elizabeth, M.D.   JM/MEDQ  D:  11/30/2001  T:  12/02/2001  Job:  [id number]   cc:   Leanne Chang, M.D.

## 2010-09-05 NOTE — Assessment & Plan Note (Signed)
[facility]                        [name] OFFICE NOTE   NAME:[name], [name]                           MRN:          [id number]  DATE:06/23/2006                            DOB:          1933-12-16    REASON FOR VISIT:  Establish care.  [name] is a 77 year old male here  to establish care from [facility].  He has a history  of hypertension and hyperlipidemia.  Patient has not been seen since I  left [facility] back in June.  He is followed closely at [facility] for Parkinson's.  Patient is here for refills on his  medications.   Additionally, Mr. [name] has a lesion behind his left ear he would like  removed.   PAST MEDICAL HISTORY:  1. Hypertension.  2. Hyperlipidemia.  3. Seasonal allergic rhinitis.  4. Erectile dysfunction.  5. Parkinson's disease.  6. Arthritis.   SURGICAL HISTORY:  1. Right humeral fracture status post internal fixation as a child.  2. Cyst removed from the left forehead.  3. Sinus surgery.   MEDICATIONS:  1. Celebrex 200 mg daily to b.i.d. p.r.n.  2. Allegra 180 mg daily.  3. Aspirin 81 mg.  4. Teveten 600 mg daily.  5. Requip 4 mg.  6. Provigil 200 mg daily.  7. Amantadine 100 mg b.i.d.  8. Flonase.  9. Lipitor 40 mg daily.  10.Carbidopa/levodopa 50/200 daily.  11.Viagra 100 mg p.r.n.   ALLERGIES:  NO KNOWN DRUG ALLERGIES.   FAMILY HISTORY:  Mother passed away from Alzheimer's.  Father passed  away with a history of colon cancer metastatic to the liver.  He is  married with 1 child, and retired.   REVIEW OF SYSTEMS:  As per HPI, otherwise unremarkable.   HEALTH MAINTENANCE:  Patient has not had a PSA done since 2006, and  reports that his colonoscopy was done in 2004, but he will confirm that  with his gastroenterologist.   OBJECTIVE:  Weight 221.6.  Pulse 68.  Blood pressure 122/80.  GENERAL:  We have a pleasant male, in no acute distress.  Questions  appropriately.  Alert and oriented x3.  NECK:  Supple.  No lymphadenopathy, carotid bruits or JVD.  LUNGS:  Clear.  HEART:  Regular rate and rhythm.  No murmurs, gallop or rubs.  EXTREMITIES:  No cyanosis, clubbing or edema.  GU:  Rectal is significant for normal tone.  Prostates was within normal  limits.  No palpable masses.  SKIN:  Significant for a 0.5 to 1 cm cystic lesion behind the left ear.   IMPRESSION:  1. Hypertension.  2. Hyperlipidemia.  3. Gastroesophageal reflux disease.  4. Erectile dysfunction.  5. Arthritis.  6. Parkinson's.  7. cystic nodule   PLAN:  1. We will schedule patient for routine lab work including lipid      profile, AST, ALT, BMET, and a PSA.  2. We will refill patient's prescriptions for Celebrex, Allegra,      Teveten, Flonase, Lipitor, and Viagra.  One-month supply with 4      refills.  3. We will refer patient to Dr. Terri Piedra for assessment of the lesion on      his left ear.  4. Patient to follow up with me in 3 months, or sooner if need be.     Leanne Chang, M.D.  Electronically Signed    LA/MedQ  DD: 06/23/2006  DT: 06/23/2006  Job #: [id number]

## 2010-09-11 ENCOUNTER — Encounter: Payer: Self-pay | Admitting: Family Medicine

## 2010-09-15 ENCOUNTER — Other Ambulatory Visit: Payer: Self-pay | Admitting: Family Medicine

## 2010-10-14 ENCOUNTER — Ambulatory Visit (INDEPENDENT_AMBULATORY_CARE_PROVIDER_SITE_OTHER): Payer: Medicare Other | Admitting: Family Medicine

## 2010-10-14 ENCOUNTER — Encounter: Payer: Self-pay | Admitting: Family Medicine

## 2010-10-14 DIAGNOSIS — R5383 Other fatigue: Secondary | ICD-10-CM | POA: Insufficient documentation

## 2010-10-14 DIAGNOSIS — E785 Hyperlipidemia, unspecified: Secondary | ICD-10-CM

## 2010-10-14 DIAGNOSIS — I10 Essential (primary) hypertension: Secondary | ICD-10-CM

## 2010-10-14 DIAGNOSIS — K219 Gastro-esophageal reflux disease without esophagitis: Secondary | ICD-10-CM

## 2010-10-14 DIAGNOSIS — R5381 Other malaise: Secondary | ICD-10-CM

## 2010-10-14 DIAGNOSIS — B351 Tinea unguium: Secondary | ICD-10-CM

## 2010-10-14 DIAGNOSIS — M549 Dorsalgia, unspecified: Secondary | ICD-10-CM

## 2010-10-14 LAB — LIPID PANEL
Cholesterol: 142 mg/dL (ref 0–200)
LDL Cholesterol: 87 mg/dL (ref 0–99)
Total CHOL/HDL Ratio: 3

## 2010-10-14 LAB — BASIC METABOLIC PANEL
BUN: 22 mg/dL (ref 6–23)
CO2: 28 mEq/L (ref 19–32)
Chloride: 108 mEq/L (ref 96–112)
Potassium: 4.1 mEq/L (ref 3.5–5.1)

## 2010-10-14 LAB — HEPATIC FUNCTION PANEL
ALT: 5 U/L (ref 0–53)
AST: 14 U/L (ref 0–37)
Bilirubin, Direct: 0.3 mg/dL (ref 0.0–0.3)
Total Bilirubin: 2.7 mg/dL — ABNORMAL HIGH (ref 0.3–1.2)
Total Protein: 6.8 g/dL (ref 6.0–8.3)

## 2010-10-14 NOTE — Assessment & Plan Note (Signed)
Extensive fungal infection of nails on L foot.  Refer to podiatry for evaluation and tx.

## 2010-10-14 NOTE — Progress Notes (Signed)
  Subjective:    Patient ID: [name], male    DOB: [birth year], 77 y.o.   MRN: [id number]  HPI HTN- chronic problem for pt, adequate control today on cozaar.  Denies CP, SOB, HAs, visual changes, edema.  Hyperlipidemia- chronic problem for pt, due for labs.  On Lipitor.  Denies abd pain, N/V, myalgias.  Back pain- hx of low back pain, now reporting increased thoracic pain.  Has seen Dr Ethelene Hal and had neck xray done- was told he had arthritis.  Will occasionally need to take a pain pill.  Reports sxs are most bothersome if he is overactive or does prolonged standing.  Fatigue- pt reports his energy level is 'not what i would like it to be'.  Had B12 checked recently at [facility]- normal.  Has not had Vit D level checked.  Pt feels his energy waxes and wanes depending on when his parkinson's meds kick in.  GERD- pt thought the Omeprazole was to treat his gas and bloating and reports he has not noticed any changes.  Wonders if he should continue.  L toe nails- reports nails are growing 'funny'.  sxs started 'quite a few months'.  Affects 1st 3 nails.  Some pain.   Review of Systems For ROS see HPI     Objective:   Physical Exam  Constitutional: He is oriented to person, place, and time. He appears well-developed and well-nourished. No distress.  HENT:  Head: Normocephalic and atraumatic.  Eyes: Conjunctivae and EOM are normal. Pupils are equal, round, and reactive to light.  Neck: Normal range of motion. Neck supple. No thyromegaly present.  Cardiovascular: Normal rate, regular rhythm, normal heart sounds and intact distal pulses.   No murmur heard. Pulmonary/Chest: Effort normal and breath sounds normal. No respiratory distress.  Abdominal: Soft. Bowel sounds are normal. He exhibits no distension.  Musculoskeletal: He exhibits no edema and no tenderness (no TTP over thoracic spine).  Lymphadenopathy:    He has no cervical adenopathy.  Neurological: He is alert and oriented to person,  place, and time. No cranial nerve deficit. Coordination normal.       Pill rolling resting tremor of L hand Shuffling gait  Skin: Skin is warm and dry.       1st 3 toes on L foot thickened, yellow and w/ extensive fungal infection  Psychiatric: He has a normal mood and affect. His behavior is normal.          Assessment & Plan:

## 2010-10-14 NOTE — Assessment & Plan Note (Signed)
Discussed this w/ pt- B12 normal.  Insurance may or may not cover Vit D level.  Recommended daily multivitamin use and if sxs persist will check Vit D level at CPE in 6 months.  Very likely medication related as he notices a correlation between meds and sxs.  Will continue to follow.

## 2010-10-14 NOTE — Assessment & Plan Note (Signed)
[name] sxs of gas/bloating are not improved w/ omeprazole- told him this is not why med was started.  Pt reports he is currently sxs free in regard to GERD.  Will stop med at this time.

## 2010-10-14 NOTE — Patient Instructions (Signed)
Please schedule your complete physical in 6 months- do not eat before this appt We'll notify you of your lab results Please check your blood pressure 2-3x/week and if it is averaging higher than 140/90, please call and we'll adjust your meds Start a daily multivitamin (ex Centrum Silver) Someone will call you with your podiatry appt If your back pain worsens- please call Dr Ethelene Hal Call with any questions or concerns Have a great summer!

## 2010-10-14 NOTE — Assessment & Plan Note (Signed)
No pain on exam today.  Has already had initial evaluation w/ Dr Ethelene Hal.  Has pain meds available prn.  Advised f/u w/ ortho if sxs persist or worsen.

## 2010-10-14 NOTE — Assessment & Plan Note (Signed)
Due for labs.  Tolerating statin w/out difficulty.

## 2010-10-14 NOTE — Assessment & Plan Note (Signed)
BP control adequate but not ideal.  Asymptomatic.  Will have pt monitor BP at home and call if values are consistently >140/90.  Pt expressed understanding and is in agreement w/ plan.

## 2010-10-15 ENCOUNTER — Encounter: Payer: Self-pay | Admitting: *Deleted

## 2010-11-29 ENCOUNTER — Other Ambulatory Visit: Payer: Self-pay | Admitting: Family Medicine

## 2010-12-01 NOTE — Telephone Encounter (Signed)
Last OV 10/14/10. Last filled 10/24/10

## 2010-12-04 ENCOUNTER — Ambulatory Visit (INDEPENDENT_AMBULATORY_CARE_PROVIDER_SITE_OTHER): Payer: Medicare Other | Admitting: Family Medicine

## 2010-12-04 DIAGNOSIS — H6120 Impacted cerumen, unspecified ear: Secondary | ICD-10-CM | POA: Insufficient documentation

## 2010-12-04 NOTE — Progress Notes (Signed)
  Subjective:    Patient ID: [name], male    DOB: 03-10-1934, 77 y.o.   MRN: [id number]  HPI Cerumen impaction- 1 week ago tried to claim free ear exam ad from the paper and was told he had excessive wax.  Had wax manually removed at that visit.  Went to [facility] on Tuesday and was told that ears were plugged.  [facility] where he was told that they were unable to irrigate or remove the wax.  Has been using Debrox tid.   Review of Systems For ROS see HPI     Objective:   Physical Exam  Vitals reviewed. Constitutional: He appears well-developed and well-nourished.  HENT:  Head: Normocephalic and atraumatic.       R and L TMs obscured by soft brown wax  TMs WNL after irrigation and wax removal          Assessment & Plan:

## 2010-12-04 NOTE — Assessment & Plan Note (Signed)
Wax was unable to be curetted but was successfully irrigated bilaterally.  Pt tolerated procedure w/out difficulty.

## 2011-01-29 ENCOUNTER — Other Ambulatory Visit: Payer: Self-pay | Admitting: Family Medicine

## 2011-03-03 ENCOUNTER — Other Ambulatory Visit: Payer: Self-pay | Admitting: Family Medicine

## 2011-03-04 NOTE — Telephone Encounter (Signed)
rx sent to pharmacy by e-script  

## 2011-03-18 ENCOUNTER — Ambulatory Visit (INDEPENDENT_AMBULATORY_CARE_PROVIDER_SITE_OTHER): Payer: Medicare Other | Admitting: Family Medicine

## 2011-03-18 ENCOUNTER — Encounter: Payer: Self-pay | Admitting: Family Medicine

## 2011-03-18 VITALS — BP 140/70 | HR 75 | Temp 97.9°F | Ht 69.75 in | Wt 202.0 lb

## 2011-03-18 DIAGNOSIS — E785 Hyperlipidemia, unspecified: Secondary | ICD-10-CM

## 2011-03-18 DIAGNOSIS — L989 Disorder of the skin and subcutaneous tissue, unspecified: Secondary | ICD-10-CM

## 2011-03-18 DIAGNOSIS — R972 Elevated prostate specific antigen [PSA]: Secondary | ICD-10-CM

## 2011-03-18 DIAGNOSIS — I10 Essential (primary) hypertension: Secondary | ICD-10-CM

## 2011-03-18 DIAGNOSIS — Z23 Encounter for immunization: Secondary | ICD-10-CM

## 2011-03-18 DIAGNOSIS — Z0000 Encounter for general adult medical examination without abnormal findings: Secondary | ICD-10-CM

## 2011-03-18 DIAGNOSIS — R350 Frequency of micturition: Secondary | ICD-10-CM

## 2011-03-18 LAB — BASIC METABOLIC PANEL
BUN: 23 mg/dL (ref 6–23)
Calcium: 9.7 mg/dL (ref 8.4–10.5)
Creatinine, Ser: 0.9 mg/dL (ref 0.4–1.5)
GFR: 86.34 mL/min (ref >60.00)

## 2011-03-18 LAB — POCT URINALYSIS DIPSTICK
Bilirubin, UA: NEGATIVE
Ketones, UA: NEGATIVE
Leukocytes, UA: NEGATIVE
Protein, UA: NEGATIVE
pH, UA: 6

## 2011-03-18 LAB — CBC WITH DIFFERENTIAL/PLATELET
Basophils Absolute: 0.1 10*3/uL (ref 0.0–0.1)
Basophils Relative: 0.9 % (ref 0.0–3.0)
Eosinophils Absolute: 0.5 10*3/uL (ref 0.0–0.7)
Hemoglobin: 15.2 g/dL (ref 13.0–17.0)
Lymphocytes Relative: 28.9 % (ref 12.0–46.0)
Lymphs Abs: 2.1 10*3/uL (ref 0.7–4.0)
MCHC: 33.1 g/dL (ref 30.0–36.0)
MCV: 93.9 fl (ref 78.0–100.0)
Monocytes Absolute: 0.7 10*3/uL (ref 0.1–1.0)
Neutro Abs: 3.9 10*3/uL (ref 1.4–7.7)
RDW: 13.7 % (ref 11.5–14.6)

## 2011-03-18 LAB — LIPID PANEL
Cholesterol: 136 mg/dL (ref 0–200)
LDL Cholesterol: 77 mg/dL (ref 0–99)
Triglycerides: 55 mg/dL (ref 0.0–149.0)
VLDL: 11 mg/dL (ref 0.0–40.0)

## 2011-03-18 LAB — HEPATIC FUNCTION PANEL
Bilirubin, Direct: 0.3 mg/dL (ref 0.0–0.3)
Total Bilirubin: 2.6 mg/dL — ABNORMAL HIGH (ref 0.3–1.2)

## 2011-03-18 LAB — TSH: TSH: 0.98 u[IU]/mL (ref 0.35–5.50)

## 2011-03-18 NOTE — Patient Instructions (Addendum)
Follow up in 6 months to recheck BP and cholesterol We'll notify you of your lab results Someone will call you with your urology and dermatology appts Try and get regular exercise Call with any questions or concerns Happy Holidays!

## 2011-03-18 NOTE — Progress Notes (Signed)
  Subjective:    Patient ID: [name], male    DOB: 01-17-1934, 77 y.o.   MRN: [id number]  HPI Here today for CPE.  Risk Factors: Urinary frequency- reports if he drinks too close to bed will be up '3-4x/night'.  Having increased urgency when standing, having difficult time controlling bladder.  No burning w/ urination.  Has previously seen Dr Brunilda Payor but not interested in returning to him. Physical Activity: walking sporadically, not exercising regularly Fall Risk: increased risk due to Parkinson's but fairly steady on feet Depression: denies current sxs Hearing: normal to conversational tones but decreased to whispered voice w/out hearing aides ADL's: independent Cognitive: normal linear thought process, memory and attention intact Home Safety: safe at home, lives w/ wife. Height, Weight, BMI, Visual Acuity: see vitals, vision corrected to 20/20 w/ glasses Counseling: UTD on colonoscopy, eye exam. Labs Ordered: See A&P Care Plan: See A&P    Review of Systems Patient reports no vision/hearing changes, anorexia, fever ,adenopathy, persistant/recurrent hoarseness, swallowing issues, chest pain, palpitations, edema, persistant/recurrent cough, hemoptysis, dyspnea (rest,exertional, paroxysmal nocturnal), gastrointestinal  bleeding (melena, rectal bleeding), abdominal pain, excessive heart burn, syncope, focal weakness, memory loss, skin/hair/nail changes, depression, anxiety, abnormal bruising/bleeding, musculoskeletal symptoms/signs.   + urinary frequency, L hand numbness (due to parkinsons)    Objective:   Physical Exam BP 140/70  Pulse 75  Temp(Src) 97.9 F (36.6 C) (Oral)  Ht 5' 9.75" (1.772 m)  Wt 202 lb (91.627 kg)  BMI 29.19 kg/m2  General Appearance:    Alert, cooperative, no distress, appears stated age  Head:    Normocephalic, without obvious abnormality, atraumatic  Eyes:    PERRL, conjunctiva/corneas clear, EOM's intact, fundi    benign, both eyes       Ears:    Normal  TM's and external ear canals, both ears  Nose:   Nares normal, septum midline, mucosa normal, no drainage   or sinus tenderness  Throat:   Lips, mucosa, and tongue normal; teeth and gums normal  Neck:   Supple, symmetrical, trachea midline, no adenopathy;       thyroid:  No enlargement/tenderness/nodules  Back:     Symmetric, no curvature, ROM normal, no CVA tenderness  Lungs:     Clear to auscultation bilaterally, respirations unlabored  Chest wall:    No tenderness or deformity  Heart:    Regular rate and rhythm, S1 and S2 normal, no murmur, rub   or gallop  Abdomen:     Soft, non-tender, bowel sounds active all four quadrants,    no masses, no organomegaly  Genitalia:    Normal male without lesion, discharge or tenderness  Rectal:    Normal tone, normal prostate, no masses or tenderness;   guaiac negative stool  Extremities:   Extremities normal, atraumatic, no cyanosis or edema  Pulses:   2+ and symmetric all extremities  Skin:   Skin color, texture, turgor normal, 1 cm lesion on R shin present x1-2 months  Lymph nodes:   Cervical, supraclavicular, and axillary nodes normal  Neurologic:   CNII-XII intact. L sided weakness, tremor         Assessment & Plan:

## 2011-03-19 ENCOUNTER — Encounter: Payer: Self-pay | Admitting: *Deleted

## 2011-03-29 NOTE — Assessment & Plan Note (Signed)
Chronic problem.  Fairly well controlled.  Asymptomatic.  No changes.

## 2011-03-29 NOTE — Assessment & Plan Note (Signed)
New.  Somewhat concerning for malignancy.  Refer to derm.

## 2011-03-29 NOTE — Assessment & Plan Note (Signed)
Check labs 

## 2011-03-29 NOTE — Assessment & Plan Note (Signed)
Pt w/out evidence of UTI.  W/ hx of frequency will refer to urology.  Pt expressed understanding and is in agreement w/ plan.

## 2011-03-29 NOTE — Assessment & Plan Note (Signed)
Chronic problem.  Tolerating statin.  Check labs.  Adjust meds prn  

## 2011-03-29 NOTE — Assessment & Plan Note (Signed)
[name] PE WNL w/ exception of known parkinson's tremor/weakness and skin lesion (see below).  UTD on health maintenance.  Check labs.  Anticipatory guidance provided.

## 2011-03-31 ENCOUNTER — Other Ambulatory Visit: Payer: Self-pay | Admitting: Dermatology

## 2011-04-09 ENCOUNTER — Ambulatory Visit (INDEPENDENT_AMBULATORY_CARE_PROVIDER_SITE_OTHER): Payer: Medicare Other | Admitting: Family Medicine

## 2011-04-09 ENCOUNTER — Encounter: Payer: Self-pay | Admitting: Family Medicine

## 2011-04-09 VITALS — BP 140/76 | HR 87 | Temp 97.6°F | Wt 200.8 lb

## 2011-04-09 DIAGNOSIS — J329 Chronic sinusitis, unspecified: Secondary | ICD-10-CM

## 2011-04-09 MED ORDER — AMOXICILLIN-POT CLAVULANATE 875-125 MG PO TABS: 1.0000 | ORAL_TABLET | Freq: Two times a day (BID) | ORAL | Status: AC

## 2011-04-09 MED ORDER — GUAIFENESIN-CODEINE 100-10 MG/5ML PO SYRP: 5.0000 mL | ORAL_SOLUTION | Freq: Three times a day (TID) | ORAL | Status: AC | PRN

## 2011-04-09 NOTE — Patient Instructions (Signed)

## 2011-04-09 NOTE — Progress Notes (Signed)
  Subjective:     [name] is a 77 y.o. male who presents for evaluation of sinus pain. Symptoms include: congestion, facial pain, headaches, nasal congestion and sinus pressure. Onset of symptoms was 3 weeks ago. Symptoms have been gradually worsening since that time. Past history is significant for no history of pneumonia or bronchitis. Patient is a non-smoker.  The following portions of the patient's history were reviewed and updated as appropriate: allergies, current medications, past family history, past medical history, past social history, past surgical history and problem list.  Review of Systems Pertinent items are noted in HPI.   Objective:    BP 140/76  Pulse 87  Temp(Src) 97.6 F (36.4 C) (Oral)  Wt 200 lb 12.8 oz (91.082 kg)  SpO2 99% General appearance: alert, cooperative, appears stated age and no distress Ears: normal TM's and external ear canals both ears Nose: green discharge, moderate congestion, sinus tenderness bilateral Throat: lips, mucosa, and tongue normal; teeth and gums normal Neck: mild anterior cervical adenopathy, supple, symmetrical, trachea midline and thyroid not enlarged, symmetric, no tenderness/mass/nodules Lungs: clear to auscultation bilaterally Extremities: extremities normal, atraumatic, no cyanosis or edema    Assessment:    Acute bacterial sinusitis.    Plan:    Nasal steroids per medication orders. Antihistamines per medication orders. Augmentin per medication orders. f/u prn

## 2011-04-21 HISTORY — PX: HERNIA REPAIR: SHX51

## 2011-04-27 ENCOUNTER — Other Ambulatory Visit: Payer: Self-pay | Admitting: Family Medicine

## 2011-04-27 MED ORDER — LOSARTAN POTASSIUM 50 MG PO TABS: 50.0000 mg | ORAL_TABLET | Freq: Two times a day (BID) | ORAL | Status: DC

## 2011-04-27 NOTE — Telephone Encounter (Signed)
rx sent to pharmacy by e-script  

## 2011-05-14 ENCOUNTER — Other Ambulatory Visit: Payer: Self-pay | Admitting: Dermatology

## 2011-05-28 ENCOUNTER — Telehealth: Payer: Self-pay | Admitting: Family Medicine

## 2011-05-28 MED ORDER — CELECOXIB 200 MG PO CAPS: 200.0000 mg | ORAL_CAPSULE | Freq: Every day | ORAL | Status: DC

## 2011-05-28 NOTE — Telephone Encounter (Signed)
rx sent to pharmacy by e-script  

## 2011-05-28 NOTE — Telephone Encounter (Signed)
Refill- celebrex 200mg  cap. Take one capsule by mouth one time daily. Qty 30 last fill 1.8.13

## 2011-08-04 ENCOUNTER — Other Ambulatory Visit: Payer: Self-pay | Admitting: Family Medicine

## 2011-08-04 MED ORDER — FLUTICASONE PROPIONATE 50 MCG/ACT NA SUSP: 2.0000 | Freq: Every day | NASAL | Status: DC

## 2011-08-04 NOTE — Telephone Encounter (Signed)
rx sent to pharmacy by e-script  

## 2011-08-04 NOTE — Telephone Encounter (Signed)
Refill for Fluticasone 50 MCG SPR APOT Dispensed as Fluticasone 50MG  SPR HI-T Qty 16 Use two sprays in each nostril daily Last filled 2.7.13  Last OV 12.20.12 Last CPE 11.28.12

## 2011-08-07 ENCOUNTER — Encounter: Payer: Self-pay | Admitting: Family Medicine

## 2011-08-07 ENCOUNTER — Ambulatory Visit (INDEPENDENT_AMBULATORY_CARE_PROVIDER_SITE_OTHER): Payer: Medicare Other | Admitting: Family Medicine

## 2011-08-07 VITALS — BP 115/75 | HR 91 | Temp 98.2°F | Ht 69.25 in | Wt 192.6 lb

## 2011-08-07 DIAGNOSIS — K4090 Unilateral inguinal hernia, without obstruction or gangrene, not specified as recurrent: Secondary | ICD-10-CM

## 2011-08-07 NOTE — Progress Notes (Signed)
  Subjective:    Patient ID: [name], male    DOB: 1934-03-26, 78 y.o.   MRN: [id number]  HPI Inguinal hernia- was told by urology that he has a direct inguinal hernia on R (Dr Vernie Ammons).  Reports that area is uncomfortable.  Was told by urology that he would need a general surgeon- told to speak w/ PCP about referral.  Is on Vesicare and Mybetriq for urinary sxs.  Mybetriq 50mg  caused him to feel 'drunk, shaky, like my blood pressure was up'.  Also causing gas.  Back pain- had injxn 3 days ago w/ Dr Ethelene Hal.   Review of Systems For ROS see HPI     Objective:   Physical Exam  Vitals reviewed. Constitutional: He appears well-developed and well-nourished. No distress.  Abdominal: Soft. Bowel sounds are normal. He exhibits no distension. There is no tenderness. There is no rebound and no guarding.          Assessment & Plan:

## 2011-08-07 NOTE — Patient Instructions (Signed)
We'll call you with your surgery appt Call Dr Vernie Ammons and let him know that you need the Mybetriq 25mg  If you have a painful lump in the groin that becomes hard or cannot be pushed on w/out pain- call or go to the ER Hang in there!

## 2011-08-09 NOTE — Assessment & Plan Note (Signed)
New.  dx'd by urology.  Will refer to general surgery to discuss surgery vs watchful waiting.  Answered pt's questions about dx.  Will follow.

## 2011-09-02 ENCOUNTER — Ambulatory Visit (INDEPENDENT_AMBULATORY_CARE_PROVIDER_SITE_OTHER): Payer: Medicare Other | Admitting: General Surgery

## 2011-09-03 ENCOUNTER — Other Ambulatory Visit: Payer: Self-pay | Admitting: Family Medicine

## 2011-09-03 MED ORDER — CELECOXIB 200 MG PO CAPS: 200.0000 mg | ORAL_CAPSULE | Freq: Every day | ORAL | Status: DC

## 2011-09-03 MED ORDER — ATORVASTATIN CALCIUM 40 MG PO TABS: 40.0000 mg | ORAL_TABLET | Freq: Every day | ORAL | Status: DC

## 2011-09-03 NOTE — Telephone Encounter (Signed)
rx sent to pharmacy by e-script  

## 2011-09-03 NOTE — Telephone Encounter (Signed)
Refills x 2  1-Celebrex 200MG  CAP PFIZ Qty 30 Take one capsule by mouth one time daily  last filled 4.14.13  2-Atorvastatin 40MG  TAB GREE Qty 30 Take one tablet by mouth one time daily Last filled 4.14.13  Last OV 4.19.13

## 2011-09-10 ENCOUNTER — Encounter (INDEPENDENT_AMBULATORY_CARE_PROVIDER_SITE_OTHER): Payer: Self-pay | Admitting: General Surgery

## 2011-09-10 ENCOUNTER — Ambulatory Visit (INDEPENDENT_AMBULATORY_CARE_PROVIDER_SITE_OTHER): Payer: Medicare Other | Admitting: General Surgery

## 2011-09-10 VITALS — BP 142/82 | HR 78 | Temp 98.0°F | Resp 14 | Ht 70.5 in | Wt 194.6 lb

## 2011-09-10 DIAGNOSIS — K4090 Unilateral inguinal hernia, without obstruction or gangrene, not specified as recurrent: Secondary | ICD-10-CM

## 2011-09-10 NOTE — Patient Instructions (Signed)
You have a moderately large right inguinal hernia, and I think that your intestinal tract is intermittently protruding into your right groin. This is completely reducible.  Because this is likely to enlarge and become more painful, we have offered elective repair of the hernia with mesh.  Once you have sorted out your neurologic problems and back problems, give me a call and we will schedule the surgery at your convenience. If the hernia becomes more painful or enlarges, call me at any time.    Inguinal Hernia, Adult Muscles help keep everything in the body in its proper place. But if a weak spot in the muscles develops, something can poke through. That is called a hernia. When this happens in the lower part of the belly (abdomen), it is called an inguinal hernia. (It takes its name from a part of the body in this region called the inguinal canal.) A weak spot in the wall of muscles lets some fat or part of the small intestine bulge through. An inguinal hernia can develop at any age. Men get them more often than women. CAUSES  In adults, an inguinal hernia develops over time.  It can be triggered by:   Suddenly straining the muscles of the lower abdomen.   Lifting heavy objects.   Straining to have a bowel movement. Difficult bowel movements (constipation) can lead to this.   Constant coughing. This may be caused by smoking or lung disease.   Being overweight.   Being pregnant.   Working at a job that requires long periods of standing or heavy lifting.   Having had an inguinal hernia before.  One type can be an emergency situation. It is called a strangulated inguinal hernia. It develops if part of the small intestine slips through the weak spot and cannot get back into the abdomen. The blood supply can be cut off. If that happens, part of the intestine may die. This situation requires emergency surgery. SYMPTOMS  Often, a small inguinal hernia has no symptoms. It is found when a  healthcare provider does a physical exam. Larger hernias usually have symptoms.   In adults, symptoms may include:   A lump in the groin. This is easier to see when the person is standing. It might disappear when lying down.   In men, a lump in the scrotum.   Pain or burning in the groin. This occurs especially when lifting, straining or coughing.   A dull ache or feeling of pressure in the groin.   Signs of a strangulated hernia can include:   A bulge in the groin that becomes very painful and tender to the touch.   A bulge that turns red or purple.   Fever, nausea and vomiting.   Inability to have a bowel movement or to pass gas.  DIAGNOSIS  To decide if you have an inguinal hernia, a healthcare provider will probably do a physical examination.  This will include asking questions about any symptoms you have noticed.   The healthcare provider might feel the groin area and ask you to cough. If an inguinal hernia is felt, the healthcare provider may try to slide it back into the abdomen.   Usually no other tests are needed.  TREATMENT  Treatments can vary. The size of the hernia makes a difference. Options include:  Watchful waiting. This is often suggested if the hernia is small and you have had no symptoms.   No medical procedure will be done unless symptoms develop.  You will need to watch closely for symptoms. If any occur, contact your healthcare provider right away.   Surgery. This is used if the hernia is larger or you have symptoms.   Open surgery. This is usually an outpatient procedure (you will not stay overnight in a hospital). An cut (incision) is made through the skin in the groin. The hernia is put back inside the abdomen. The weak area in the muscles is then repaired by herniorrhaphy or hernioplasty. Herniorrhaphy: in this type of surgery, the weak muscles are sewn back together. Hernioplasty: a patch or mesh is used to close the weak area in the abdominal  wall.   Laparoscopy. In this procedure, a surgeon makes small incisions. A thin tube with a tiny video camera (called a laparoscope) is put into the abdomen. The surgeon repairs the hernia with mesh by looking with the video camera and using two long instruments.  HOME CARE INSTRUCTIONS   After surgery to repair an inguinal hernia:   You will need to take pain medicine prescribed by your healthcare provider. Follow all directions carefully.   You will need to take care of the wound from the incision.   Your activity will be restricted for awhile. This will probably include no heavy lifting for several weeks. You also should not do anything too active for a few weeks. When you can return to work will depend on the type of job that you have.   During "watchful waiting" periods, you should:   Maintain a healthy weight.   Eat a diet high in fiber (fruits, vegetables and whole grains).   Drink plenty of fluids to avoid constipation. This means drinking enough water and other liquids to keep your urine clear or pale yellow.   Do not lift heavy objects.   Do not stand for long periods of time.   Quit smoking. This should keep you from developing a frequent cough.  SEEK MEDICAL CARE IF:   A bulge develops in your groin area.   You feel pain, a burning sensation or pressure in the groin. This might be worse if you are lifting or straining.   You develop a fever of more than 100.5 F (38.1 C).  SEEK IMMEDIATE MEDICAL CARE IF:   Pain in the groin increases suddenly.   A bulge in the groin gets bigger suddenly and does not go down.   For men, there is sudden pain in the scrotum. Or, the size of the scrotum increases.   A bulge in the groin area becomes red or purple and is painful to touch.   You have nausea or vomiting that does not go away.   You feel your heart beating much faster than normal.   You cannot have a bowel movement or pass gas.   You develop a fever of more than  102.0 F (38.9 C).  Document Released: 08/23/2008 Document Revised: 03/26/2011 Document Reviewed: 08/23/2008 [facility] Patient Information 2012 [place], [place].

## 2011-09-10 NOTE — Progress Notes (Signed)
Patient ID: [name], male   DOB: 1934/01/16, 78 y.o.   MRN: [id number]  Chief Complaint  Patient presents with  . New Evaluation    New Pt. Eval Hernia    HPI [name] is a 78 y.o. male.  He is referred to me by Dr. Neena Rhymes for evaluation of a right inguinal hernia.  This patient is a very pleasant gentleman, retired from [employer] and [employer]. Moved from [place] to [place] 15 years ago. He has significant medical problems with parkinsonism, neurogenic bladder, BPH, and spine disease. He intermittently gets spine injections by Dr. Ethelene Hal. He is being followed by a neurologist at [facility].  He recently saw Dr. Vernie Ammons and was told he had a right inguinal hernia. He says that he feels occasional pain in his right groin and hears his intestines gurgle in that area. No prior history of hernia. HPI  Past Medical History  Diagnosis Date  . Hyperlipidemia   . Hypertension   . Osteoarthritis   . Parkinson disease   . Erectile dysfunction     Past Surgical History  Procedure Date  . Right arm fracture:  orif   . Cystectomy     left forehead  . Nasal sinus surgery 1982    Family History  Problem Relation Age of Onset  . Alzheimer's disease Mother   . Liver cancer Father   . Cancer Father     colon    Social History History  Substance Use Topics  . Smoking status: Former Smoker    Quit date: 04/20/1969  . Smokeless tobacco: Never Used  . Alcohol Use: Yes     2-3x per week; wine    No Known Allergies  Current Outpatient Prescriptions  Medication Sig Dispense Refill  . amantadine (SYMMETREL) 100 MG capsule Take 100 mg by mouth 4 (four) times daily - after meals and at bedtime.        Marland Kitchen aspirin 81 MG tablet Take 81 mg by mouth daily.        Marland Kitchen atorvastatin (LIPITOR) 40 MG tablet Take 1 tablet (40 mg total) by mouth daily.  30 tablet  0  . carbidopa-levodopa (SINEMET CR) 50-200 MG per tablet Take 4.5 tablets by mouth daily.        .  carbidopa-levodopa (SINEMET) 25-100 MG per tablet Take 1 tablet by mouth as needed.        . celecoxib (CELEBREX) 200 MG capsule Take 1 capsule (200 mg total) by mouth daily.  30 capsule  0  . fexofenadine (ALLEGRA) 180 MG tablet Take 180 mg by mouth daily.        . fluticasone (FLONASE) 50 MCG/ACT nasal spray Place 2 sprays into the nose daily.  16 g  3  . losartan (COZAAR) 50 MG tablet Take 1 tablet (50 mg total) by mouth 2 (two) times daily.  60 tablet  5  . oxyCODONE-acetaminophen (PERCOCET) 10-325 MG per tablet Take 1-2 tablets by mouth every 6 (six) hours as needed.        . selegiline (ELDEPRYL) 5 MG tablet Take 5 mg by mouth 2 (two) times daily.          Review of Systems Review of Systems  Constitutional: Negative for fever, chills and unexpected weight change.  HENT: Negative for hearing loss, congestion, sore throat, trouble swallowing and voice change.   Eyes: Negative for visual disturbance.  Respiratory: Negative for cough and wheezing.   Cardiovascular: Negative for chest pain, palpitations  and leg swelling.  Gastrointestinal: Negative for nausea, vomiting, abdominal pain, diarrhea, constipation, blood in stool, abdominal distention, anal bleeding and rectal pain.  Genitourinary: Positive for difficulty urinating. Negative for hematuria.  Musculoskeletal: Positive for back pain, arthralgias and gait problem.  Skin: Negative for rash and wound.  Neurological: Positive for tremors. Negative for seizures, syncope, weakness and headaches.  Hematological: Negative for adenopathy. Does not bruise/bleed easily.  Psychiatric/Behavioral: Negative for confusion.    Blood pressure 142/82, pulse 78, temperature 98 F (36.7 C), temperature source Temporal, resp. rate 14, height 5' 10.5" (1.791 m), weight 194 lb 9.6 oz (88.27 kg).  Physical Exam Physical Exam  Constitutional: He is oriented to person, place, and time. He appears well-developed and well-nourished. No distress.        Very pleasant. Intelligent  HENT:  Head: Normocephalic.  Nose: Nose normal.  Mouth/Throat: No oropharyngeal exudate.  Eyes: Conjunctivae and EOM are normal. Pupils are equal, round, and reactive to light. Right eye exhibits no discharge. Left eye exhibits no discharge. No scleral icterus.  Neck: Normal range of motion. Neck supple. No JVD present. No tracheal deviation present. No thyromegaly present.  Cardiovascular: Normal rate, regular rhythm, normal heart sounds and intact distal pulses.   No murmur heard. Pulmonary/Chest: Effort normal and breath sounds normal. No stridor. No respiratory distress. He has no wheezes. He has no rales. He exhibits no tenderness.  Abdominal: Soft. Bowel sounds are normal. He exhibits no distension and no mass. There is no tenderness. There is no rebound and no guarding.  Genitourinary:       Moderately large right inguinal hernia. Does not extend into the scrotum. Was able to reduce this when supine but not when standing. Testicles are present but atrophic. No evidence of hernia on the left.  Musculoskeletal: Normal range of motion. He exhibits no edema and no tenderness.  Lymphadenopathy:    He has no cervical adenopathy.  Neurological: He is alert and oriented to person, place, and time. He has normal reflexes. Coordination normal.       Mild LUE intention tremor  Skin: Skin is warm and dry. No rash noted. He is not diaphoretic. No erythema. No pallor.  Psychiatric: He has a normal mood and affect. His behavior is normal. Judgment and thought content normal.    Data Reviewed Notes in Epic.  Assessment    Large right inguinal hernia, reducible. Becoming symptomatic.  Parkinson's disease, stable and controlled.  Neurogenic bladder  Elevated PSA  Benign prostatic hypertrophy  Spinal column disease requiring injections   hypertension    Plan    The patient would like to have his right inguinal  hernia repaired at some time in the future,  but now is not a good time for him. He is currently getting neurologic evaluation and  a swallowing study at [facility]. He needs physical therapy. He thinks he is going to have further injections of his spine. He would like to postpone  hernia repair until he gets his other problems sorted out, unless the hernia becomes more symptomatic.  I told her that I would probably repair this large hernia with an open technique under general anesthesia, and probably keep him in the hospital one night to make sure that his pain is under control and  he doesn't have problems with urinary retention.  Discussed indications details, techniques, and all the risks of the surgery with him. He understands these issues. His questions are answered. He agrees with this plan.  I will standby and await his return call when he is ready to have the surgery, or do this sooner if symptoms progress.       Angelia Mould. Derrell Lolling, M.D., [facility] General and Minimally invasive Surgery Breast and Colorectal Surgery Office:   [phone] Pager:   [phone]  09/10/2011, 12:05 PM

## 2011-09-18 ENCOUNTER — Ambulatory Visit: Payer: Medicare Other | Attending: Physical Medicine and Rehabilitation | Admitting: Physical Therapy

## 2011-09-18 DIAGNOSIS — IMO0001 Reserved for inherently not codable concepts without codable children: Secondary | ICD-10-CM | POA: Insufficient documentation

## 2011-09-18 DIAGNOSIS — M546 Pain in thoracic spine: Secondary | ICD-10-CM | POA: Insufficient documentation

## 2011-09-18 DIAGNOSIS — M2569 Stiffness of other specified joint, not elsewhere classified: Secondary | ICD-10-CM | POA: Insufficient documentation

## 2011-09-18 DIAGNOSIS — M542 Cervicalgia: Secondary | ICD-10-CM | POA: Insufficient documentation

## 2011-09-21 ENCOUNTER — Ambulatory Visit: Payer: Medicare Other | Attending: Physical Medicine and Rehabilitation | Admitting: Physical Therapy

## 2011-09-21 DIAGNOSIS — M2569 Stiffness of other specified joint, not elsewhere classified: Secondary | ICD-10-CM | POA: Insufficient documentation

## 2011-09-21 DIAGNOSIS — IMO0001 Reserved for inherently not codable concepts without codable children: Secondary | ICD-10-CM | POA: Insufficient documentation

## 2011-09-21 DIAGNOSIS — M546 Pain in thoracic spine: Secondary | ICD-10-CM | POA: Insufficient documentation

## 2011-09-21 DIAGNOSIS — M542 Cervicalgia: Secondary | ICD-10-CM | POA: Insufficient documentation

## 2011-09-24 ENCOUNTER — Ambulatory Visit: Payer: Medicare Other | Admitting: Physical Therapy

## 2011-09-29 ENCOUNTER — Ambulatory Visit: Payer: Medicare Other | Admitting: Physical Therapy

## 2011-09-30 ENCOUNTER — Encounter: Payer: Medicare Other | Admitting: Physical Therapy

## 2011-10-01 ENCOUNTER — Telehealth: Payer: Self-pay | Admitting: Family Medicine

## 2011-10-01 ENCOUNTER — Ambulatory Visit: Payer: Medicare Other | Admitting: Physical Therapy

## 2011-10-01 NOTE — Telephone Encounter (Signed)
Refill: Atorvastatin 40mg  tab. Take one tablet by mouth one time daily. Qty 30. Last fill 09-03-11 Celebrex 200mg  cap. Take one capsule by mouth one time daily. Qty 30. Last fill 5--16-13

## 2011-10-02 MED ORDER — ATORVASTATIN CALCIUM 40 MG PO TABS: 40.0000 mg | ORAL_TABLET | Freq: Every day | ORAL | Status: DC

## 2011-10-02 MED ORDER — CELECOXIB 200 MG PO CAPS: 200.0000 mg | ORAL_CAPSULE | Freq: Every day | ORAL | Status: DC

## 2011-10-02 NOTE — Telephone Encounter (Signed)
Refill done.  

## 2011-10-13 ENCOUNTER — Ambulatory Visit: Payer: Medicare Other | Admitting: Physical Therapy

## 2011-10-15 ENCOUNTER — Ambulatory Visit: Payer: Medicare Other | Admitting: Physical Therapy

## 2011-10-21 ENCOUNTER — Ambulatory Visit: Payer: Medicare Other | Attending: Physical Medicine and Rehabilitation | Admitting: Physical Therapy

## 2011-10-21 DIAGNOSIS — IMO0001 Reserved for inherently not codable concepts without codable children: Secondary | ICD-10-CM | POA: Insufficient documentation

## 2011-10-21 DIAGNOSIS — M2569 Stiffness of other specified joint, not elsewhere classified: Secondary | ICD-10-CM | POA: Insufficient documentation

## 2011-10-21 DIAGNOSIS — M546 Pain in thoracic spine: Secondary | ICD-10-CM | POA: Insufficient documentation

## 2011-10-21 DIAGNOSIS — M542 Cervicalgia: Secondary | ICD-10-CM | POA: Insufficient documentation

## 2011-10-27 ENCOUNTER — Ambulatory Visit: Payer: Medicare Other | Admitting: Physical Therapy

## 2011-10-29 ENCOUNTER — Emergency Department (HOSPITAL_BASED_OUTPATIENT_CLINIC_OR_DEPARTMENT_OTHER): Payer: Medicare Other

## 2011-10-29 ENCOUNTER — Emergency Department (HOSPITAL_BASED_OUTPATIENT_CLINIC_OR_DEPARTMENT_OTHER)
Admission: EM | Admit: 2011-10-29 | Discharge: 2011-10-29 | Disposition: A | Discharge status: Home / Self Care | Payer: Medicare Other | Attending: Emergency Medicine | Admitting: Emergency Medicine

## 2011-10-29 ENCOUNTER — Ambulatory Visit: Payer: Medicare Other | Admitting: Physical Therapy

## 2011-10-29 ENCOUNTER — Encounter (HOSPITAL_BASED_OUTPATIENT_CLINIC_OR_DEPARTMENT_OTHER): Payer: Self-pay

## 2011-10-29 DIAGNOSIS — G20 Parkinson's disease: Secondary | ICD-10-CM | POA: Insufficient documentation

## 2011-10-29 DIAGNOSIS — G20A1 Parkinson's disease without dyskinesia, without mention of fluctuations: Secondary | ICD-10-CM | POA: Insufficient documentation

## 2011-10-29 DIAGNOSIS — Z7982 Long term (current) use of aspirin: Secondary | ICD-10-CM | POA: Insufficient documentation

## 2011-10-29 DIAGNOSIS — S01309A Unspecified open wound of unspecified ear, initial encounter: Secondary | ICD-10-CM | POA: Insufficient documentation

## 2011-10-29 DIAGNOSIS — Z79899 Other long term (current) drug therapy: Secondary | ICD-10-CM | POA: Insufficient documentation

## 2011-10-29 DIAGNOSIS — W1809XA Striking against other object with subsequent fall, initial encounter: Secondary | ICD-10-CM | POA: Insufficient documentation

## 2011-10-29 DIAGNOSIS — I10 Essential (primary) hypertension: Secondary | ICD-10-CM | POA: Insufficient documentation

## 2011-10-29 DIAGNOSIS — Y92009 Unspecified place in unspecified non-institutional (private) residence as the place of occurrence of the external cause: Secondary | ICD-10-CM | POA: Insufficient documentation

## 2011-10-29 DIAGNOSIS — Z87891 Personal history of nicotine dependence: Secondary | ICD-10-CM | POA: Insufficient documentation

## 2011-10-29 DIAGNOSIS — E785 Hyperlipidemia, unspecified: Secondary | ICD-10-CM | POA: Insufficient documentation

## 2011-10-29 DIAGNOSIS — S01312A Laceration without foreign body of left ear, initial encounter: Secondary | ICD-10-CM

## 2011-10-29 MED ORDER — DOXYCYCLINE HYCLATE 100 MG PO CAPS: 100.0000 mg | ORAL_CAPSULE | Freq: Two times a day (BID) | ORAL | Status: AC

## 2011-10-29 MED ORDER — TETANUS-DIPHTHERIA TOXOIDS TD 5-2 LFU IM INJ
INJECTION | INTRAMUSCULAR | Status: AC
  Filled 2011-10-29: qty 0.5

## 2011-10-29 MED ORDER — TETANUS-DIPHTHERIA TOXOIDS TD 5-2 LFU IM INJ
0.5000 mL | INJECTION | Freq: Once | INTRAMUSCULAR | Status: AC
  Administered 2011-10-29: 0.5 mL via INTRAMUSCULAR

## 2011-10-29 MED ORDER — LIDOCAINE HCL 2 % IJ SOLN
INTRAMUSCULAR | Status: AC
  Filled 2011-10-29: qty 1

## 2011-10-29 MED ORDER — LIDOCAINE HCL 2 % IJ SOLN
20.0000 mL | Freq: Once | INTRAMUSCULAR | Status: AC
  Administered 2011-10-29: 20 mg

## 2011-10-29 MED ORDER — OXYCODONE-ACETAMINOPHEN 5-325 MG PO TABS
2.0000 | ORAL_TABLET | ORAL | Status: AC | PRN
Start: 2011-10-29 — End: 2011-11-08

## 2011-10-29 NOTE — ED Notes (Signed)
Laceration to left ear after a fall.

## 2011-10-29 NOTE — ED Notes (Signed)
PA at bedside.

## 2011-10-29 NOTE — ED Provider Notes (Signed)
History     CSN: [id number]  Arrival date & time 10/29/11  1814   First MD Initiated Contact with Patient 10/29/11 1927      Chief Complaint  Patient presents with  . Ear Laceration    (Consider location/radiation/quality/duration/timing/severity/associated sxs/prior treatment) Patient is a 78 y.o. male presenting with skin laceration. The history is provided by the patient. No language interpreter was used.  Laceration  The incident occurred less than 1 hour ago. The laceration is located on the face. The laceration is 3 cm in size. Injury mechanism: Pt fell hitting a table. The pain is at a severity of 5/10. The pain is moderate. The pain has been constant since onset. He reports no foreign bodies present. His tetanus status is out of date.   Pt has a large laceration to his left ear, through cartilage, gapping Past Medical History  Diagnosis Date  . Hyperlipidemia   . Hypertension   . Osteoarthritis   . Parkinson disease   . Erectile dysfunction     Past Surgical History  Procedure Date  . Right arm fracture:  orif   . Cystectomy     left forehead  . Nasal sinus surgery 1982    Family History  Problem Relation Age of Onset  . Alzheimer's disease Mother   . Liver cancer Father   . Cancer Father     colon    History  Substance Use Topics  . Smoking status: Former Smoker    Quit date: 04/20/1969  . Smokeless tobacco: Never Used  . Alcohol Use: Yes     2-3x per week; wine      Review of Systems  HENT: Positive for ear pain.   All other systems reviewed and are negative.    Allergies  Review of patient's allergies indicates no known allergies.  Home Medications   Current Outpatient Rx  Name Route Sig Dispense Refill  . AMANTADINE HCL 100 MG PO CAPS Oral Take 100 mg by mouth 4 (four) times daily - after meals and at bedtime.      . ASPIRIN 81 MG PO TABS Oral Take 162 mg by mouth daily.     . ATORVASTATIN CALCIUM 40 MG PO TABS Oral Take 1 tablet (40  mg total) by mouth daily. 30 tablet 0  . CARBIDOPA-LEVODOPA 50-200 MG PO TBCR Oral Take 1-1.5 tablets by mouth 4 (four) times daily. 1 & 1/2 tablets at 8 am, 1 tablet at 12 pm, 1 tablet at 4 pm and 1 tablet at 8 pm    . CARBIDOPA-LEVODOPA 25-100 MG PO TABS Oral Take 1 tablet by mouth 2 (two) times daily.     . CELECOXIB 200 MG PO CAPS Oral Take 1 capsule (200 mg total) by mouth daily. 30 capsule 0  . LEXAPRO PO Oral Take 1 tablet by mouth daily.    Marland Kitchen FEXOFENADINE HCL 180 MG PO TABS Oral Take 180 mg by mouth daily.      Marland Kitchen FLUTICASONE PROPIONATE 50 MCG/ACT NA SUSP Nasal Place 2 sprays into the nose daily. 16 g 3  . LOSARTAN POTASSIUM 50 MG PO TABS Oral Take 1 tablet (50 mg total) by mouth 2 (two) times daily. 60 tablet 5  . ADULT MULTIVITAMIN W/MINERALS CH Oral Take 1 tablet by mouth daily.    . OXYCODONE-ACETAMINOPHEN 5-325 MG PO TABS Oral Take 1 tablet by mouth every 4 (four) hours as needed. For back pain    . DOXYCYCLINE HYCLATE 100 MG PO CAPS Oral  Take 1 capsule (100 mg total) by mouth 2 (two) times daily. 20 capsule 0    BP 153/88  Pulse 54  Temp 97.6 F (36.4 C) (Oral)  Resp 16  Ht 5\' 11"  (1.803 m)  Wt 190 lb (86.183 kg)  BMI 26.50 kg/m2  SpO2 99%  Physical Exam  Nursing note and vitals reviewed. Constitutional: He is oriented to person, place, and time. He appears well-developed and well-nourished.  HENT:  Head: Normocephalic and atraumatic.  Nose: Nose normal.  Mouth/Throat: Oropharynx is clear and moist.       3cm laceration left ear lobe gapping  Through cartilage   Eyes: Conjunctivae and EOM are normal. Pupils are equal, round, and reactive to light.  Neck: Normal range of motion. Neck supple.  Cardiovascular: Normal rate.   Pulmonary/Chest: Effort normal.  Abdominal: Soft.  Musculoskeletal:       c spine diffusely tender,    Neurological: He is alert and oriented to person, place, and time. He has normal reflexes.  Skin: Skin is warm.  Psychiatric: He has a normal  mood and affect.    ED Course  LACERATION REPAIR Date/Time: 10/29/2011 11:13 PM Performed by: Elson Areas Authorized by: Elson Areas Consent: Verbal consent obtained. Risks and benefits: risks, benefits and alternatives were discussed Consent given by: patient Patient understanding: patient states understanding of the procedure being performed Patient identity confirmed: verbally with patient Time out: Immediately prior to procedure a "time out" was called to verify the correct patient, procedure, equipment, support staff and site/side marked as required. Body area: head/neck Location details: left ear Laceration length: 3 cm Foreign bodies: no foreign bodies Anesthesia: local infiltration Irrigation solution: saline Amount of cleaning: standard Debridement: none Skin closure: 6-0 Prolene Subcutaneous closure: 5-0 Vicryl Number of sutures: 12 Technique: simple Approximation: close Approximation difficulty: simple Patient tolerance: Patient tolerated the procedure well with no immediate complications. Comments: 4 sutures to close cartilage.    Pt counseled on risk of cartilage not healing, infection and cauliflower ear from trauma   (including critical care time)  Labs Reviewed - No data to display Dg Cervical Spine Complete  10/29/2011  *RADIOLOGY REPORT*  Clinical Data: Fall  CERVICAL SPINE - COMPLETE 4+ VIEW  Comparison: None.  Findings: 6 mm retrolisthesis C6 upon C7 is present.  Facet dislocation is not excluded.  No vertebral compression deformity. Unremarkable prevertebral soft tissues.  Foramina grossly patent. Osteopenia.  Odontoid intact.  IMPRESSION: Marked anterolisthesis C6 upon C7.  Facet dislocation is not excluded.  CT can be performed to rule out injury.  Original Report Authenticated By: Donavan Burnet, M.D.     1. Laceration of ear, external, left, complicated       MDM    Pt advised to return in 2 days for recheck.        Lonia Skinner [place],  [place] 10/31/11 [address] [place], [place] 11/02/11 [phone]

## 2011-10-31 ENCOUNTER — Encounter (HOSPITAL_BASED_OUTPATIENT_CLINIC_OR_DEPARTMENT_OTHER): Payer: Self-pay | Admitting: *Deleted

## 2011-10-31 ENCOUNTER — Emergency Department (HOSPITAL_BASED_OUTPATIENT_CLINIC_OR_DEPARTMENT_OTHER)
Admission: EM | Admit: 2011-10-31 | Discharge: 2011-10-31 | Disposition: A | Discharge status: Home / Self Care | Payer: Medicare Other | Attending: Emergency Medicine | Admitting: Emergency Medicine

## 2011-10-31 DIAGNOSIS — Z87891 Personal history of nicotine dependence: Secondary | ICD-10-CM | POA: Insufficient documentation

## 2011-10-31 DIAGNOSIS — E785 Hyperlipidemia, unspecified: Secondary | ICD-10-CM | POA: Insufficient documentation

## 2011-10-31 DIAGNOSIS — G20A1 Parkinson's disease without dyskinesia, without mention of fluctuations: Secondary | ICD-10-CM | POA: Insufficient documentation

## 2011-10-31 DIAGNOSIS — I10 Essential (primary) hypertension: Secondary | ICD-10-CM | POA: Insufficient documentation

## 2011-10-31 DIAGNOSIS — G20 Parkinson's disease: Secondary | ICD-10-CM | POA: Insufficient documentation

## 2011-10-31 DIAGNOSIS — Z4800 Encounter for change or removal of nonsurgical wound dressing: Secondary | ICD-10-CM | POA: Insufficient documentation

## 2011-10-31 DIAGNOSIS — S01312A Laceration without foreign body of left ear, initial encounter: Secondary | ICD-10-CM

## 2011-10-31 DIAGNOSIS — M1990 Unspecified osteoarthritis, unspecified site: Secondary | ICD-10-CM | POA: Insufficient documentation

## 2011-10-31 NOTE — ED Notes (Signed)
Pt states he was seen here Thursday and told to return today for recheck of head wound. Dressing intact.

## 2011-10-31 NOTE — ED Provider Notes (Signed)
History     CSN: [id number]  Arrival date & time 10/31/11  1337   First MD Initiated Contact with Patient 10/31/11 1341      Chief Complaint  Patient presents with  . Wound Check    (Consider location/radiation/quality/duration/timing/severity/associated sxs/prior treatment) Patient is a 78 y.o. male presenting with skin laceration. The history is provided by the patient. No language interpreter was used.  Laceration  The incident occurred yesterday. Pain location: ear. The laceration is 3 cm in size.   Pt is here for recheck.  Bandage is in place Past Medical History  Diagnosis Date  . Hyperlipidemia   . Hypertension   . Osteoarthritis   . Parkinson disease   . Erectile dysfunction     Past Surgical History  Procedure Date  . Right arm fracture:  orif   . Cystectomy     left forehead  . Nasal sinus surgery 1982    Family History  Problem Relation Age of Onset  . Alzheimer's disease Mother   . Liver cancer Father   . Cancer Father     colon    History  Substance Use Topics  . Smoking status: Former Smoker    Quit date: 04/20/1969  . Smokeless tobacco: Never Used  . Alcohol Use: Yes     2-3x per week; wine      Review of Systems  Skin: Positive for wound.  All other systems reviewed and are negative.    Allergies  Review of patient's allergies indicates no known allergies.  Home Medications   Current Outpatient Rx  Name Route Sig Dispense Refill  . AMANTADINE HCL 100 MG PO CAPS Oral Take 100 mg by mouth 4 (four) times daily - after meals and at bedtime.      . ASPIRIN 81 MG PO TABS Oral Take 162 mg by mouth daily.     . ATORVASTATIN CALCIUM 40 MG PO TABS Oral Take 1 tablet (40 mg total) by mouth daily. 30 tablet 0  . CARBIDOPA-LEVODOPA 50-200 MG PO TBCR Oral Take 1-1.5 tablets by mouth 4 (four) times daily. 1 & 1/2 tablets at 8 am, 1 tablet at 12 pm, 1 tablet at 4 pm and 1 tablet at 8 pm    . CARBIDOPA-LEVODOPA 25-100 MG PO TABS Oral Take 1  tablet by mouth 2 (two) times daily.     . CELECOXIB 200 MG PO CAPS Oral Take 1 capsule (200 mg total) by mouth daily. 30 capsule 0  . DOXYCYCLINE HYCLATE 100 MG PO CAPS Oral Take 1 capsule (100 mg total) by mouth 2 (two) times daily. 20 capsule 0  . LEXAPRO PO Oral Take 1 tablet by mouth daily.    Marland Kitchen FEXOFENADINE HCL 180 MG PO TABS Oral Take 180 mg by mouth daily.      Marland Kitchen FLUTICASONE PROPIONATE 50 MCG/ACT NA SUSP Nasal Place 2 sprays into the nose daily. 16 g 3  . LOSARTAN POTASSIUM 50 MG PO TABS Oral Take 1 tablet (50 mg total) by mouth 2 (two) times daily. 60 tablet 5  . ADULT MULTIVITAMIN W/MINERALS CH Oral Take 1 tablet by mouth daily.    . OXYCODONE-ACETAMINOPHEN 5-325 MG PO TABS Oral Take 1 tablet by mouth every 4 (four) hours as needed. For back pain    . OXYCODONE-ACETAMINOPHEN 5-325 MG PO TABS Oral Take 2 tablets by mouth every 4 (four) hours as needed for pain. 15 tablet 0    BP 163/67  Pulse 88  Temp 97.9 F (  36.6 C) (Oral)  Resp 20  Ht 5\' 11"  (1.803 m)  Wt 190 lb (86.183 kg)  BMI 26.50 kg/m2  SpO2 100%  Physical Exam  Nursing note and vitals reviewed. Constitutional: He is oriented to person, place, and time. He appears well-developed and well-nourished.  Musculoskeletal: He exhibits tenderness.  Neurological: He is alert and oriented to person, place, and time. He has normal reflexes.  Skin:       3cm laceration left ear.  Psychiatric: He has a normal mood and affect.    ED Course  Procedures (including critical care time)  Labs Reviewed - No data to display Dg Cervical Spine Complete  10/29/2011  *RADIOLOGY REPORT*  Clinical Data: Fall  CERVICAL SPINE - COMPLETE 4+ VIEW  Comparison: None.  Findings: 6 mm retrolisthesis C6 upon C7 is present.  Facet dislocation is not excluded.  No vertebral compression deformity. Unremarkable prevertebral soft tissues.  Foramina grossly patent. Osteopenia.  Odontoid intact.  IMPRESSION: Marked anterolisthesis C6 upon C7.  Facet  dislocation is not excluded.  CT can be performed to rule out injury.  Original Report Authenticated By: Donavan Burnet, M.D.     1. Laceration of left external ear       MDM  Pt to return for recheck on thursday        Lonia Skinner [place], [place] 10/31/11 1447  Lonia Skinner [place], [place] 10/31/11 1450

## 2011-11-04 ENCOUNTER — Telehealth: Payer: Self-pay | Admitting: Family Medicine

## 2011-11-04 MED ORDER — LOSARTAN POTASSIUM 50 MG PO TABS: 50.0000 mg | ORAL_TABLET | Freq: Two times a day (BID) | ORAL | Status: DC

## 2011-11-04 MED ORDER — ATORVASTATIN CALCIUM 40 MG PO TABS: 40.0000 mg | ORAL_TABLET | Freq: Every day | ORAL | Status: DC

## 2011-11-04 MED ORDER — CELECOXIB 200 MG PO CAPS: 200.0000 mg | ORAL_CAPSULE | Freq: Every day | ORAL | Status: DC

## 2011-11-04 NOTE — Telephone Encounter (Signed)
Refill done.  

## 2011-11-04 NOTE — Telephone Encounter (Signed)
Refill: Celebrex 200mg  cap. Take one capsule by mouth one time daily. Qty 30. Last fill 10-02-11 Atorvastatin 40mg  tab. Take one tablet by mouth one time daily. Qty 30. Last fill 10-02-11

## 2011-11-04 NOTE — Telephone Encounter (Signed)
Refill: Losartan pot 50mg  tab. Take one tablet by mouth twice daily. Qty 60. Last fill 10-01-11

## 2011-11-05 ENCOUNTER — Encounter (HOSPITAL_BASED_OUTPATIENT_CLINIC_OR_DEPARTMENT_OTHER): Payer: Self-pay | Admitting: *Deleted

## 2011-11-05 ENCOUNTER — Emergency Department (HOSPITAL_BASED_OUTPATIENT_CLINIC_OR_DEPARTMENT_OTHER)
Admission: EM | Admit: 2011-11-05 | Discharge: 2011-11-05 | Disposition: A | Discharge status: Home / Self Care | Payer: Medicare Other | Attending: Emergency Medicine | Admitting: Emergency Medicine

## 2011-11-05 DIAGNOSIS — Z87891 Personal history of nicotine dependence: Secondary | ICD-10-CM | POA: Insufficient documentation

## 2011-11-05 DIAGNOSIS — G20A1 Parkinson's disease without dyskinesia, without mention of fluctuations: Secondary | ICD-10-CM | POA: Insufficient documentation

## 2011-11-05 DIAGNOSIS — G20 Parkinson's disease: Secondary | ICD-10-CM | POA: Insufficient documentation

## 2011-11-05 DIAGNOSIS — E785 Hyperlipidemia, unspecified: Secondary | ICD-10-CM | POA: Insufficient documentation

## 2011-11-05 DIAGNOSIS — I10 Essential (primary) hypertension: Secondary | ICD-10-CM | POA: Insufficient documentation

## 2011-11-05 DIAGNOSIS — Z4802 Encounter for removal of sutures: Secondary | ICD-10-CM

## 2011-11-05 DIAGNOSIS — M1990 Unspecified osteoarthritis, unspecified site: Secondary | ICD-10-CM | POA: Insufficient documentation

## 2011-11-05 NOTE — ED Notes (Signed)
Here for suture removal of sutures from his left ear. Healing well.

## 2011-11-05 NOTE — ED Provider Notes (Signed)
History     CSN: [id number]  Arrival date & time 11/05/11  1355   First MD Initiated Contact with Patient 11/05/11 1405      Chief Complaint  Patient presents with  . Suture / Staple Removal    (Consider location/radiation/quality/duration/timing/severity/associated sxs/prior treatment) Patient is a 78 y.o. male presenting with suture removal. The history is provided by the patient. No language interpreter was used.  Suture / Staple Removal  The sutures were placed 7 to 10 days ago. Treatments since wound repair include antibiotic ointment use. There has been no drainage from the wound. There is no redness present. The pain has no pain.   Pt denies any complaints.  Wound is healing well Past Medical History  Diagnosis Date  . Hyperlipidemia   . Hypertension   . Osteoarthritis   . Parkinson disease   . Erectile dysfunction     Past Surgical History  Procedure Date  . Right arm fracture:  orif   . Cystectomy     left forehead  . Nasal sinus surgery 1982    Family History  Problem Relation Age of Onset  . Alzheimer's disease Mother   . Liver cancer Father   . Cancer Father     colon    History  Substance Use Topics  . Smoking status: Former Smoker    Quit date: 04/20/1969  . Smokeless tobacco: Never Used  . Alcohol Use: Yes     2-3x per week; wine      Review of Systems  Skin: Positive for wound.  All other systems reviewed and are negative.    Allergies  Review of patient's allergies indicates no known allergies.  Home Medications   Current Outpatient Rx  Name Route Sig Dispense Refill  . AMANTADINE HCL 100 MG PO CAPS Oral Take 100 mg by mouth 4 (four) times daily - after meals and at bedtime.      . ASPIRIN 81 MG PO TABS Oral Take 162 mg by mouth daily.     . ATORVASTATIN CALCIUM 40 MG PO TABS Oral Take 1 tablet (40 mg total) by mouth daily. 30 tablet 1  . CARBIDOPA-LEVODOPA 50-200 MG PO TBCR Oral Take 1-1.5 tablets by mouth 4 (four) times daily.  1 & 1/2 tablets at 8 am, 1 tablet at 12 pm, 1 tablet at 4 pm and 1 tablet at 8 pm    . CARBIDOPA-LEVODOPA 25-100 MG PO TABS Oral Take 1 tablet by mouth 2 (two) times daily.     . CELECOXIB 200 MG PO CAPS Oral Take 1 capsule (200 mg total) by mouth daily. 30 capsule 1  . DOXYCYCLINE HYCLATE 100 MG PO CAPS Oral Take 1 capsule (100 mg total) by mouth 2 (two) times daily. 20 capsule 0  . LEXAPRO PO Oral Take 1 tablet by mouth daily.    Marland Kitchen FEXOFENADINE HCL 180 MG PO TABS Oral Take 180 mg by mouth daily.      Marland Kitchen FLUTICASONE PROPIONATE 50 MCG/ACT NA SUSP Nasal Place 2 sprays into the nose daily. 16 g 3  . LOSARTAN POTASSIUM 50 MG PO TABS Oral Take 1 tablet (50 mg total) by mouth 2 (two) times daily. 60 tablet 1  . ADULT MULTIVITAMIN W/MINERALS CH Oral Take 1 tablet by mouth daily.    . OXYCODONE-ACETAMINOPHEN 5-325 MG PO TABS Oral Take 1 tablet by mouth every 4 (four) hours as needed. For back pain    . OXYCODONE-ACETAMINOPHEN 5-325 MG PO TABS Oral Take 2 tablets  by mouth every 4 (four) hours as needed for pain. 15 tablet 0    BP 154/91  Pulse 88  Temp 98 F (36.7 C) (Oral)  SpO2 97%  Physical Exam  Nursing note and vitals reviewed. Constitutional: He appears well-developed and well-nourished.  HENT:  Head: Normocephalic and atraumatic.  Musculoskeletal: He exhibits no tenderness.       Healing laceration left ear,  Color looks good, no infection  Neurological: He is alert.  Skin: Skin is warm and dry.    ED Course  Procedures (including critical care time)  Labs Reviewed - No data to display No results found.   1. Visit for suture removal       MDM  Return if any problems.        Lonia Skinner [place], [place] 11/05/11 1445  Lonia Skinner [place], [place] 11/05/11 1446

## 2011-11-05 NOTE — ED Provider Notes (Signed)
Medical screening examination/treatment/procedure(s) were performed by non-physician practitioner and as supervising physician I was immediately available for consultation/collaboration.   Forbes Cellar, MD 11/05/11 [phone]

## 2011-11-05 NOTE — ED Provider Notes (Signed)
Medical screening examination/treatment/procedure(s) were performed by non-physician practitioner and as supervising physician I was immediately available for consultation/collaboration.   Shelda Jakes, MD 11/05/11 2203

## 2011-11-05 NOTE — ED Notes (Signed)
Removed 12 stiches , cleaned wound

## 2011-11-05 NOTE — ED Provider Notes (Signed)
Medical screening examination/treatment/procedure(s) were performed by non-physician practitioner and as supervising physician I was immediately available for consultation/collaboration.   Shelda Jakes, MD 11/05/11 2211

## 2012-01-05 ENCOUNTER — Other Ambulatory Visit: Payer: Self-pay | Admitting: Family Medicine

## 2012-01-05 ENCOUNTER — Encounter: Payer: Self-pay | Admitting: Internal Medicine

## 2012-01-05 NOTE — Telephone Encounter (Signed)
Refill: Celebrex 200mg  cap. Take one capsule by mouth one time daily. Qty 30. Last fill 12-04-11 Losartan pot 50mg  tab. Take one tablet by mouth twice daily. Qty 60. Last fill 12-04-11 Atorvastatin 40mg  tab. Take one tablet by mouth one time daily. Qty 30. Last fill 12-04-11

## 2012-01-05 NOTE — Telephone Encounter (Signed)
Pt was advised on 10-14-10 to schedule CPE in 6 months, last OV noted as acute on 04-09-11 MD Lowne, please advise

## 2012-01-06 NOTE — Telephone Encounter (Signed)
Ok to refill x3 months but please remind pt he is overdue on CPE

## 2012-01-07 MED ORDER — CELECOXIB 200 MG PO CAPS: 200.0000 mg | ORAL_CAPSULE | Freq: Every day | ORAL | Status: DC

## 2012-01-07 MED ORDER — LOSARTAN POTASSIUM 50 MG PO TABS: 50.0000 mg | ORAL_TABLET | Freq: Two times a day (BID) | ORAL | Status: DC

## 2012-01-07 MED ORDER — ATORVASTATIN CALCIUM 40 MG PO TABS: 40.0000 mg | ORAL_TABLET | Freq: Every day | ORAL | Status: DC

## 2012-01-07 NOTE — Telephone Encounter (Signed)
rx sent to pharmacy by e-script  

## 2012-01-08 ENCOUNTER — Ambulatory Visit: Payer: Medicare Other

## 2012-01-13 ENCOUNTER — Ambulatory Visit (INDEPENDENT_AMBULATORY_CARE_PROVIDER_SITE_OTHER): Payer: Medicare Other

## 2012-01-13 DIAGNOSIS — Z23 Encounter for immunization: Secondary | ICD-10-CM

## 2012-01-18 ENCOUNTER — Telehealth (INDEPENDENT_AMBULATORY_CARE_PROVIDER_SITE_OTHER): Payer: Self-pay | Admitting: General Surgery

## 2012-01-18 ENCOUNTER — Telehealth (INDEPENDENT_AMBULATORY_CARE_PROVIDER_SITE_OTHER): Payer: Self-pay

## 2012-01-18 NOTE — Telephone Encounter (Signed)
Called back patient based on message left earlier today. Patient stated he noticed an increase in pain and swelling yesterday (01/17/12) at 4:00 pm. Said hernia became hard and the size of a baseball and hard in the center. He took 1 percocet 5-325 and slept for 1.5 hours. When he awoke the pain subsided but was still uncomfortable. Today it is not as bad, believes he may have been constipated yesterday, had bowel movement today and feels better. Patient stated the swelling and hardness of hernia has gone down to the size of a golf ball and it tender in the middle now. Patient stated it could have been triggered by his being on the floor working on the speakers of his television. Advised patient information would be forwarded to Dr. Derrell Lolling and I would get back to him with an answer as quickly as I could. Advised the current appointment for 02/11/12 will remain for now. Advised him Dr. Derrell Lolling at the hospital this week and may be in the middle of surgery. Patient agreed.

## 2012-01-18 NOTE — Telephone Encounter (Signed)
Patient called in wanting a sooner appointment than when he is scheduled. His hernia all the sudden became painful and doesn't want to wait 3 weeks. Hernia is reducible. Please call patient if he can be seen sooner.

## 2012-02-11 ENCOUNTER — Ambulatory Visit (INDEPENDENT_AMBULATORY_CARE_PROVIDER_SITE_OTHER): Payer: Medicare Other | Admitting: General Surgery

## 2012-02-11 ENCOUNTER — Encounter (INDEPENDENT_AMBULATORY_CARE_PROVIDER_SITE_OTHER): Payer: Self-pay | Admitting: General Surgery

## 2012-02-11 VITALS — BP 140/72 | HR 80 | Temp 97.8°F | Resp 14 | Ht 70.5 in | Wt 200.0 lb

## 2012-02-11 DIAGNOSIS — K4090 Unilateral inguinal hernia, without obstruction or gangrene, not specified as recurrent: Secondary | ICD-10-CM

## 2012-02-11 NOTE — Patient Instructions (Signed)
The pain and swelling you had in your right groin recently sounds like an episode of incarceration. I can reduce the hernia completely today so there is no emergency. I advised her to go ahead with surgery to repair the hernia in the near future.  He will be scheduled for open repair of right anal hernia with mesh in the near future.        Inguinal Hernia, Adult  Care After Refer to this sheet in the next few weeks. These discharge instructions provide you with general information on caring for yourself after you leave the hospital. Your caregiver may also give you specific instructions. Your treatment has been planned according to the most current medical practices available, but unavoidable complications sometimes occur. If you have any problems or questions after discharge, please call your caregiver. HOME CARE INSTRUCTIONS  Put ice on the operative site.  Put ice in a plastic bag.  Place a towel between your skin and the bag.  Leave the ice on for 15 to 20 minutes at a time, 3 to 4 times a day while awake.  Change bandages (dressings) as directed.  Keep the wound dry and clean. The wound may be washed gently with soap and water. Gently blot or dab the wound dry. It is okay to take showers 24 to 48 hours after surgery. Do not take baths, use swimming pools, or use hot tubs for 10 days, or as directed by your caregiver.  Only take over-the-counter or prescription medicines for pain, discomfort, or fever as directed by your caregiver.  Continue your normal diet as directed.  Do not lift anything more than 10 pounds or play contact sports for 3 weeks, or as directed. SEEK MEDICAL CARE IF:  There is redness, swelling, or increasing pain in the wound.  There is fluid (pus) coming from the wound.  There is drainage from a wound lasting longer than 1 day.  You have an oral temperature above 102 F (38.9 C).  You notice a bad smell coming from the wound or dressing.  The  wound breaks open after the stitches (sutures) have been removed.  You notice increasing pain in the shoulders (shoulder strap areas).  You develop dizzy episodes or fainting while standing.  You feel sick to your stomach (nauseous) or throw up (vomit). SEEK IMMEDIATE MEDICAL CARE IF:  You develop a rash.  You have difficulty breathing.  You develop a reaction or have side effects to medicines you were given. MAKE SURE YOU:   Understand these instructions.  Will watch your condition.  Will get help right away if you are not doing well or get worse. Document Released: 05/07/2006 Document Revised: 06/29/2011 Document Reviewed: 03/06/2009 [facility] Patient Information 2013 [place], [place].       Inguinal Hernia, Adult Muscles help keep everything in the body in its proper place. But if a weak spot in the muscles develops, something can poke through. That is called a hernia. When this happens in the lower part of the belly (abdomen), it is called an inguinal hernia. (It takes its name from a part of the body in this region called the inguinal canal.) A weak spot in the wall of muscles lets some fat or part of the small intestine bulge through. An inguinal hernia can develop at any age. Men get them more often than women. CAUSES  In adults, an inguinal hernia develops over time.  It can be triggered by:  Suddenly straining the muscles of the lower  abdomen.  Lifting heavy objects.  Straining to have a bowel movement. Difficult bowel movements (constipation) can lead to this.  Constant coughing. This may be caused by smoking or lung disease.  Being overweight.  Being pregnant.  Working at a job that requires long periods of standing or heavy lifting.  Having had an inguinal hernia before. One type can be an emergency situation. It is called a strangulated inguinal hernia. It develops if part of the small intestine slips through the weak spot and cannot get back into the  abdomen. The blood supply can be cut off. If that happens, part of the intestine may die. This situation requires emergency surgery. SYMPTOMS  Often, a small inguinal hernia has no symptoms. It is found when a healthcare provider does a physical exam. Larger hernias usually have symptoms.   In adults, symptoms may include:  A lump in the groin. This is easier to see when the person is standing. It might disappear when lying down.  In men, a lump in the scrotum.  Pain or burning in the groin. This occurs especially when lifting, straining or coughing.  A dull ache or feeling of pressure in the groin.  Signs of a strangulated hernia can include:  A bulge in the groin that becomes very painful and tender to the touch.  A bulge that turns red or purple.  Fever, nausea and vomiting.  Inability to have a bowel movement or to pass gas. DIAGNOSIS  To decide if you have an inguinal hernia, a healthcare provider will probably do a physical examination.  This will include asking questions about any symptoms you have noticed.  The healthcare provider might feel the groin area and ask you to cough. If an inguinal hernia is felt, the healthcare provider may try to slide it back into the abdomen.  Usually no other tests are needed. TREATMENT  Treatments can vary. The size of the hernia makes a difference. Options include:  Watchful waiting. This is often suggested if the hernia is small and you have had no symptoms.  No medical procedure will be done unless symptoms develop.  You will need to watch closely for symptoms. If any occur, contact your healthcare provider right away.  Surgery. This is used if the hernia is larger or you have symptoms.  Open surgery. This is usually an outpatient procedure (you will not stay overnight in a hospital). An cut (incision) is made through the skin in the groin. The hernia is put back inside the abdomen. The weak area in the muscles is then repaired by  herniorrhaphy or hernioplasty. Herniorrhaphy: in this type of surgery, the weak muscles are sewn back together. Hernioplasty: a patch or mesh is used to close the weak area in the abdominal wall.  Laparoscopy. In this procedure, a surgeon makes small incisions. A thin tube with a tiny video camera (called a laparoscope) is put into the abdomen. The surgeon repairs the hernia with mesh by looking with the video camera and using two long instruments. HOME CARE INSTRUCTIONS   After surgery to repair an inguinal hernia:  You will need to take pain medicine prescribed by your healthcare provider. Follow all directions carefully.  You will need to take care of the wound from the incision.  Your activity will be restricted for awhile. This will probably include no heavy lifting for several weeks. You also should not do anything too active for a few weeks. When you can return to work will depend on  the type of job that you have.  During "watchful waiting" periods, you should:  Maintain a healthy weight.  Eat a diet high in fiber (fruits, vegetables and whole grains).  Drink plenty of fluids to avoid constipation. This means drinking enough water and other liquids to keep your urine clear or pale yellow.  Do not lift heavy objects.  Do not stand for long periods of time.  Quit smoking. This should keep you from developing a frequent cough. SEEK MEDICAL CARE IF:   A bulge develops in your groin area.  You feel pain, a burning sensation or pressure in the groin. This might be worse if you are lifting or straining.  You develop a fever of more than 100.5 F (38.1 C). SEEK IMMEDIATE MEDICAL CARE IF:   Pain in the groin increases suddenly.  A bulge in the groin gets bigger suddenly and does not go down.  For men, there is sudden pain in the scrotum. Or, the size of the scrotum increases.  A bulge in the groin area becomes red or purple and is painful to touch.  You have nausea or  vomiting that does not go away.  You feel your heart beating much faster than normal.  You cannot have a bowel movement or pass gas.  You develop a fever of more than 102.0 F (38.9 C). Document Released: 08/23/2008 Document Revised: 06/29/2011 Document Reviewed: 08/23/2008 [facility] Patient Information 2013 [place], [place].

## 2012-02-11 NOTE — Progress Notes (Signed)
Patient ID: [name], male   DOB: Aug 19, 1933, 78 y.o.   MRN: [id number]  No chief complaint on file.   HPI [name] is a 78 y.o. male.  He returns to discuss surgical management of his right inguinal hernia.  This is a pleasant gentleman who has significant problems with parkinsonism, and neurogenic bladder, BPH, and chronic back pain from spinal disease. I saw him back in May and he wanted to postpone the hernia repair while he underwent neurologic workup at [facility]. He has completed that workup.  On  September 29, he had an episode of severe pain. The hernia became larger, the size of a baseball, became hard and tender. He has no nausea or vomiting. He took a Microbiologist and laid down and went to sleep and the pain went away. Since that time he feels a bulge but he is eating well and having normal bowel movements. The lump is much smaller.  Despite his neurologic problems, he remains pretty active. He now wants to have something done in the near future. HPI  Past Medical History  Diagnosis Date  . Hyperlipidemia   . Hypertension   . Osteoarthritis   . Parkinson disease   . Erectile dysfunction     Past Surgical History  Procedure Date  . Right arm fracture:  orif   . Cystectomy     left forehead  . Nasal sinus surgery 1982    Family History  Problem Relation Age of Onset  . Alzheimer's disease Mother   . Liver cancer Father   . Cancer Father     colon    Social History History  Substance Use Topics  . Smoking status: Former Smoker    Quit date: 04/20/1969  . Smokeless tobacco: Never Used  . Alcohol Use: Yes     2-3x per week; wine    No Known Allergies  Current Outpatient Prescriptions  Medication Sig Dispense Refill  . amantadine (SYMMETREL) 100 MG capsule Take 100 mg by mouth 4 (four) times daily - after meals and at bedtime.        Marland Kitchen aspirin 81 MG tablet Take 162 mg by mouth daily.       Marland Kitchen atorvastatin (LIPITOR) 40 MG tablet Take 1 tablet (40 mg total)  by mouth daily.  30 tablet  3  . carbidopa-levodopa (SINEMET CR) 50-200 MG per tablet Take 1-1.5 tablets by mouth 4 (four) times daily. 1 & 1/2 tablets at 8 am, 1 tablet at 12 pm, 1 tablet at 4 pm and 1 tablet at 8 pm      . carbidopa-levodopa (SINEMET) 25-100 MG per tablet Take 1 tablet by mouth 2 (two) times daily.       . celecoxib (CELEBREX) 200 MG capsule Take 1 capsule (200 mg total) by mouth daily.  30 capsule  3  . Escitalopram Oxalate (LEXAPRO PO) Take 1 tablet by mouth daily.      . fexofenadine (ALLEGRA) 180 MG tablet Take 180 mg by mouth daily.        . fluticasone (FLONASE) 50 MCG/ACT nasal spray Place 2 sprays into the nose daily.  16 g  3  . losartan (COZAAR) 50 MG tablet Take 1 tablet (50 mg total) by mouth 2 (two) times daily.  60 tablet  3  . Multiple Vitamin (MULTIVITAMIN WITH MINERALS) TABS Take 1 tablet by mouth daily.      Marland Kitchen oxyCODONE-acetaminophen (PERCOCET) 5-325 MG per tablet Take 1 tablet by mouth every 4 (four)  hours as needed. For back pain      . selegiline (ELDEPRYL) 5 MG capsule         Review of Systems Review of Systems  Constitutional: Negative for fever, chills and unexpected weight change.  HENT: Negative for hearing loss, congestion, sore throat, trouble swallowing and voice change.   Eyes: Negative for visual disturbance.  Respiratory: Negative for cough and wheezing.   Cardiovascular: Negative for chest pain, palpitations and leg swelling.  Gastrointestinal: Negative for nausea, vomiting, abdominal pain, diarrhea, constipation, blood in stool, abdominal distention, anal bleeding and rectal pain.  Genitourinary: Positive for frequency and difficulty urinating. Negative for hematuria.  Musculoskeletal: Positive for back pain and arthralgias.  Skin: Negative for rash and wound.  Neurological: Positive for tremors. Negative for seizures, syncope, weakness and headaches.  Hematological: Negative for adenopathy. Does not bruise/bleed easily.    Psychiatric/Behavioral: Negative for confusion.    Blood pressure 140/72, pulse 80, temperature 97.8 F (36.6 C), resp. rate 14, height 5' 10.5" (1.791 m), weight 200 lb (90.719 kg).  Physical Exam Physical Exam  Constitutional: He is oriented to person, place, and time. He appears well-developed and well-nourished. No distress.  HENT:  Head: Normocephalic.  Nose: Nose normal.  Mouth/Throat: No oropharyngeal exudate.  Eyes: Conjunctivae normal and EOM are normal. Pupils are equal, round, and reactive to light. Right eye exhibits no discharge. Left eye exhibits no discharge. No scleral icterus.  Neck: Normal range of motion. Neck supple. No JVD present. No tracheal deviation present. No thyromegaly present.  Cardiovascular: Normal rate, regular rhythm, normal heart sounds and intact distal pulses.   No murmur heard. Pulmonary/Chest: Effort normal and breath sounds normal. No stridor. No respiratory distress. He has no wheezes. He has no rales. He exhibits no tenderness.  Abdominal: Soft. Bowel sounds are normal. He exhibits no distension and no mass. There is no tenderness. There is no rebound and no guarding.  Genitourinary:       Moderate size right inguinal hernia, does not extend to the scrotum. I was able to slowly reduce this and supine. Minimally tender. No scrotal mass. Testes feel a fairly normal to slightly atrophic. No hernia on the left.  Musculoskeletal: Normal range of motion. He exhibits no edema and no tenderness.  Lymphadenopathy:    He has no cervical adenopathy.  Neurological: He is alert and oriented to person, place, and time. He has normal reflexes. Coordination normal.       Slight left upper extremity intention tremor  Skin: Skin is warm and dry. No rash noted. He is not diaphoretic. No erythema. No pallor.  Psychiatric: He has a normal mood and affect. His behavior is normal. Judgment and thought content normal.    Data Reviewed My old records. Epic  notes..  Assessment    Large right inguinal hernia, reducible. Recent episode of incarceration raises concern for impending complications  Parkinson's disease, stable and controlled  Neurogenic bladder  elevated PSA  BPH  Spinal column disease requiring injections    Plan    We'll schedule for open repair of his right inguinal hernia with mesh. We will keep him in the hospital overnight to make sure that the is not having any trouble voiding that his pain is under control.  I once again discussed the indications, details, techniques and numerous risk of the surgery with him. He understands all this and all his questions were answered and he agrees with this plan.       Angelia Mould.  Derrell Lolling, M.D., [facility] General and Minimally invasive Surgery Breast and Colorectal Surgery Office:   [phone] Pager:   [phone]  02/11/2012, 12:29 PM

## 2012-03-01 ENCOUNTER — Encounter (HOSPITAL_COMMUNITY): Payer: Self-pay | Admitting: Pharmacy Technician

## 2012-03-02 ENCOUNTER — Ambulatory Visit (HOSPITAL_COMMUNITY)
Admission: RE | Admit: 2012-03-02 | Discharge: 2012-03-02 | Disposition: A | Discharge status: Home / Self Care | Payer: Medicare Other | Source: Ambulatory Visit | Attending: General Surgery | Admitting: General Surgery

## 2012-03-02 ENCOUNTER — Encounter (HOSPITAL_COMMUNITY)
Admission: RE | Admit: 2012-03-02 | Discharge: 2012-03-02 | Disposition: A | Discharge status: Home / Self Care | Payer: Medicare Other | Source: Ambulatory Visit | Attending: General Surgery | Admitting: General Surgery

## 2012-03-02 ENCOUNTER — Encounter (HOSPITAL_COMMUNITY): Payer: Self-pay

## 2012-03-02 DIAGNOSIS — Z01811 Encounter for preprocedural respiratory examination: Secondary | ICD-10-CM | POA: Insufficient documentation

## 2012-03-02 DIAGNOSIS — K4090 Unilateral inguinal hernia, without obstruction or gangrene, not specified as recurrent: Secondary | ICD-10-CM | POA: Insufficient documentation

## 2012-03-02 HISTORY — DX: Gastro-esophageal reflux disease without esophagitis: K21.9

## 2012-03-02 LAB — BASIC METABOLIC PANEL
BUN: 24 mg/dL — ABNORMAL HIGH (ref 6–23)
CO2: 29 mEq/L (ref 19–32)
GFR calc non Af Amer: 84 mL/min — ABNORMAL LOW (ref >90)
Glucose, Bld: 96 mg/dL (ref 70–99)
Potassium: 4.7 mEq/L (ref 3.5–5.1)
Sodium: 138 mEq/L (ref 135–145)

## 2012-03-02 LAB — CBC
HCT: 47 % (ref 39.0–52.0)
MCH: 30.5 pg (ref 26.0–34.0)
MCV: 91.4 fL (ref 78.0–100.0)
Platelets: 223 10*3/uL (ref 150–400)
RBC: 5.14 MIL/uL (ref 4.22–5.81)
WBC: 8 10*3/uL (ref 4.0–10.5)

## 2012-03-02 NOTE — Patient Instructions (Addendum)
20      Your procedure is scheduled on:  Wednesday 03/09/2012 at 230 pm  Report to [facility] at  1200 noon.   Call this number if you have problems the morning of surgery: [phone]   Remember:   Do not eat food after midnight! FROM MIDNIGHT UP UNTIL 0830 AM THE AM OF SURGERY , MAY HAVE CLEAR LIQUIDS THEN NOTHING AFTER 0830 AM UNTIL AFTER SURGERY!  Take these medicines the morning of surgery with A SIP OF WATER: Amantadine, Allegra, Selegiline, Carbidopa-Levodopa,Lipitor,Flonase nasal spray,    Do not bring valuables to the hospital.  .  Leave suitcase in the car. After surgery it may be brought to your room.  For patients admitted to the hospital, checkout time is 11:00 AM the day of              Discharge.    Special Instructions: See [facility] Preparing  For Surgery Instruction Sheet. Do not wear jewelry, lotions powders, perfumes. Women do not shave legs or underarms for 12 hours before showers. Contacts, partial plates, or dentures may not be worn into surgery.                          Patients discharged the day of surgery will not be allowed to drive home. If going home the same day of surgery, must have someone stay with you  first 24 hrs.at home and arrange for someone to drive you home from the Hospital.             YOUR DRIVER ZO:[id number]   Please read over the following fact sheets that you were given: MRSA INFORMATION,INCENTIVE SPIROMETRY SHEET, SLEEP APNEA SHEET                            Telford Nab.Kassidie Hendriks,RN,BSN     [phone]

## 2012-03-02 NOTE — Progress Notes (Signed)
03/02/12 1636  OBSTRUCTIVE SLEEP APNEA  Have you ever been diagnosed with sleep apnea through a sleep study? No  Do you snore loudly (loud enough to be heard through closed doors)?  1  Do you often feel tired, fatigued, or sleepy during the daytime? 1  Has anyone observed you stop breathing during your sleep? 0  Do you have, or are you being treated for high blood pressure? 1  BMI more than 35 kg/m2? 0  Age over 76 years old? 1  Neck circumference greater than 40 cm/18 inches? 0  Gender: 1  Obstructive Sleep Apnea Score 5   Score 4 or greater  Results sent to PCP

## 2012-03-02 NOTE — Progress Notes (Signed)
Office note from [facility] for [facility] 11/12/2011 on chart.

## 2012-03-05 LAB — MRSA CULTURE

## 2012-03-08 NOTE — H&P (Signed)
[name]    MRN: [id number]   Description: 78 year old male  Provider: Ernestene Mention, MD  Department: [facility]      Diagnoses     Right inguinal hernia   - Primary    550.90         Vitals    BP Pulse Temp Resp Ht Wt    140/72 80 97.8 F (36.6 C) 14 5' 10.5" (1.791 m) 200 lb (90.719 kg)  BMI - 28.29 kg/m2                 History and Physical     Ernestene Mention, MD   Patient ID: [name], male   DOB: 1933/07/08, 78 y.o.   MRN: [id number]            HPI [name] is a 78 y.o. male.  He returns to discuss surgical management of his right inguinal hernia.   This is a pleasant gentleman who has significant problems with parkinsonism, and neurogenic bladder, BPH, and chronic back pain from spinal disease. I saw him back in May and he wanted to postpone the hernia repair while he underwent neurologic workup at [facility]. He has completed that workup.   On  September 29, he had an episode of severe pain. The hernia became larger, the size of a baseball, became hard and tender. He has no nausea or vomiting. He took a Microbiologist and laid down and went to sleep and the pain went away. Since that time he feels a bulge but he is eating well and having normal bowel movements. The lump is much smaller.   Despite his neurologic problems, he remains pretty active. He now wants to have something done in the near future.     Past Medical History   Diagnosis  Date   .  Hyperlipidemia     .  Hypertension     .  Osteoarthritis     .  Parkinson disease     .  Erectile dysfunction         Past Surgical History   Procedure  Date   .  Right arm fracture:  orif     .  Cystectomy         left forehead   .  Nasal sinus surgery  1982       Family History   Problem  Relation  Age of Onset   .  Alzheimer's disease  Mother     .  Liver cancer  Father     .  Cancer  Father         colon      Social History History   Substance Use Topics   .  Smoking  status:  Former Smoker       Quit date:  04/20/1969   .  Smokeless tobacco:  Never Used   .  Alcohol Use:  Yes         2-3x per week; wine      No Known Allergies    Current Outpatient Prescriptions   Medication  Sig  Dispense  Refill   .  amantadine (SYMMETREL) 100 MG capsule  Take 100 mg by mouth 4 (four) times daily - after meals and at bedtime.           Marland Kitchen  aspirin 81 MG tablet  Take 162 mg by mouth daily.          Marland Kitchen  atorvastatin (  LIPITOR) 40 MG tablet  Take 1 tablet (40 mg total) by mouth daily.   30 tablet   3   .  carbidopa-levodopa (SINEMET CR) 50-200 MG per tablet  Take 1-1.5 tablets by mouth 4 (four) times daily. 1 & 1/2 tablets at 8 am, 1 tablet at 12 pm, 1 tablet at 4 pm and 1 tablet at 8 pm         .  carbidopa-levodopa (SINEMET) 25-100 MG per tablet  Take 1 tablet by mouth 2 (two) times daily.          .  celecoxib (CELEBREX) 200 MG capsule  Take 1 capsule (200 mg total) by mouth daily.   30 capsule   3   .  Escitalopram Oxalate (LEXAPRO PO)  Take 1 tablet by mouth daily.         .  fexofenadine (ALLEGRA) 180 MG tablet  Take 180 mg by mouth daily.           .  fluticasone (FLONASE) 50 MCG/ACT nasal spray  Place 2 sprays into the nose daily.   16 g   3   .  losartan (COZAAR) 50 MG tablet  Take 1 tablet (50 mg total) by mouth 2 (two) times daily.   60 tablet   3   .  Multiple Vitamin (MULTIVITAMIN WITH MINERALS) TABS  Take 1 tablet by mouth daily.         Marland Kitchen  oxyCODONE-acetaminophen (PERCOCET) 5-325 MG per tablet  Take 1 tablet by mouth every 4 (four) hours as needed. For back pain         .  selegiline (ELDEPRYL) 5 MG capsule              Review of Systems   Constitutional: Negative for fever, chills and unexpected weight change.  HENT: Negative for hearing loss, congestion, sore throat, trouble swallowing and voice change.   Eyes: Negative for visual disturbance.  Respiratory: Negative for cough and wheezing.   Cardiovascular: Negative for chest pain, palpitations and  leg swelling.  Gastrointestinal: Negative for nausea, vomiting, abdominal pain, diarrhea, constipation, blood in stool, abdominal distention, anal bleeding and rectal pain.  Genitourinary: Positive for frequency and difficulty urinating. Negative for hematuria.  Musculoskeletal: Positive for back pain and arthralgias.  Skin: Negative for rash and wound.  Neurological: Positive for tremors. Negative for seizures, syncope, weakness and headaches.  Hematological: Negative for adenopathy. Does not bruise/bleed easily.  Psychiatric/Behavioral: Negative for confusion.    Blood pressure 140/72, pulse 80, temperature 97.8 F (36.6 C), resp. rate 14, height 5' 10.5" (1.791 m), weight 200 lb (90.719 kg).   Physical Exam   Constitutional: He is oriented to person, place, and time. He appears well-developed and well-nourished. No distress.  HENT:   Head: Normocephalic.   Nose: Nose normal.   Mouth/Throat: No oropharyngeal exudate.  Eyes: Conjunctivae normal and EOM are normal. Pupils are equal, round, and reactive to light. Right eye exhibits no discharge. Left eye exhibits no discharge. No scleral icterus.  Neck: Normal range of motion. Neck supple. No JVD present. No tracheal deviation present. No thyromegaly present.  Cardiovascular: Normal rate, regular rhythm, normal heart sounds and intact distal pulses.    No murmur heard. Pulmonary/Chest: Effort normal and breath sounds normal. No stridor. No respiratory distress. He has no wheezes. He has no rales. He exhibits no tenderness.  Abdominal: Soft. Bowel sounds are normal. He exhibits no distension and no mass. There is no tenderness. There is  no rebound and no guarding.  Genitourinary:       Moderate size right inguinal hernia, does not extend to the scrotum. I was able to slowly reduce this and supine. Minimally tender. No scrotal mass. Testes feel a fairly normal to slightly atrophic. No hernia on the left.  Musculoskeletal: Normal range of  motion. He exhibits no edema and no tenderness.  Lymphadenopathy:    He has no cervical adenopathy.  Neurological: He is alert and oriented to person, place, and time. He has normal reflexes. Coordination normal.       Slight left upper extremity intention tremor  Skin: Skin is warm and dry. No rash noted. He is not diaphoretic. No erythema. No pallor.  Psychiatric: He has a normal mood and affect. His behavior is normal. Judgment and thought content normal.    Data Reviewed My old records. Epic notes..   Assessment Large right inguinal hernia, reducible. Recent episode of incarceration raises concern for impending complications  Parkinson's disease, stable and controlled  Neurogenic bladder  elevated PSA  BPH  Spinal column disease requiring injections     Plan We'll schedule for open repair of his right inguinal hernia with mesh. We will keep him in the hospital overnight to make sure that the is not having any trouble voiding that his pain is under control.   I once again discussed the indications, details, techniques and numerous risk of the surgery with him. He understands all this and all his questions were answered and he agrees with this plan.       Angelia Mould. Derrell Lolling, M.D., [facility] General and Minimally invasive Surgery Breast and Colorectal Surgery Office:   [phone] Pager:   [phone]

## 2012-03-09 ENCOUNTER — Encounter (HOSPITAL_COMMUNITY): Payer: Self-pay | Admitting: *Deleted

## 2012-03-09 ENCOUNTER — Encounter (HOSPITAL_COMMUNITY)
Admission: RE | Disposition: A | Discharge status: Home / Self Care | Payer: Self-pay | Source: Ambulatory Visit | Attending: General Surgery

## 2012-03-09 ENCOUNTER — Ambulatory Visit (HOSPITAL_COMMUNITY)
Admission: RE | Admit: 2012-03-09 | Discharge: 2012-03-10 | Disposition: A | Discharge status: Home / Self Care | Payer: Medicare Other | Source: Ambulatory Visit | Attending: General Surgery | Admitting: General Surgery

## 2012-03-09 ENCOUNTER — Encounter (HOSPITAL_COMMUNITY): Payer: Self-pay | Admitting: Anesthesiology

## 2012-03-09 ENCOUNTER — Ambulatory Visit (HOSPITAL_COMMUNITY): Payer: Medicare Other | Admitting: Anesthesiology

## 2012-03-09 DIAGNOSIS — K4090 Unilateral inguinal hernia, without obstruction or gangrene, not specified as recurrent: Secondary | ICD-10-CM

## 2012-03-09 DIAGNOSIS — I10 Essential (primary) hypertension: Secondary | ICD-10-CM | POA: Insufficient documentation

## 2012-03-09 DIAGNOSIS — G20A1 Parkinson's disease without dyskinesia, without mention of fluctuations: Secondary | ICD-10-CM | POA: Insufficient documentation

## 2012-03-09 DIAGNOSIS — Z79899 Other long term (current) drug therapy: Secondary | ICD-10-CM | POA: Insufficient documentation

## 2012-03-09 DIAGNOSIS — G8929 Other chronic pain: Secondary | ICD-10-CM | POA: Insufficient documentation

## 2012-03-09 DIAGNOSIS — G20 Parkinson's disease: Secondary | ICD-10-CM | POA: Insufficient documentation

## 2012-03-09 DIAGNOSIS — M549 Dorsalgia, unspecified: Secondary | ICD-10-CM | POA: Insufficient documentation

## 2012-03-09 DIAGNOSIS — N319 Neuromuscular dysfunction of bladder, unspecified: Secondary | ICD-10-CM | POA: Insufficient documentation

## 2012-03-09 DIAGNOSIS — E785 Hyperlipidemia, unspecified: Secondary | ICD-10-CM | POA: Insufficient documentation

## 2012-03-09 DIAGNOSIS — N400 Enlarged prostate without lower urinary tract symptoms: Secondary | ICD-10-CM | POA: Insufficient documentation

## 2012-03-09 HISTORY — PX: INGUINAL HERNIA REPAIR: SHX194

## 2012-03-09 HISTORY — PX: INSERTION OF MESH: SHX5868

## 2012-03-09 SURGERY — REPAIR, HERNIA, INGUINAL, ADULT
Anesthesia: General | Site: Groin | Laterality: Right | Wound class: Clean Contaminated

## 2012-03-09 MED ORDER — BUPIVACAINE-EPINEPHRINE (PF) 0.5% -1:200000 IJ SOLN
INTRAMUSCULAR | Status: AC
  Filled 2012-03-09: qty 10

## 2012-03-09 MED ORDER — ACETAMINOPHEN 10 MG/ML IV SOLN
INTRAVENOUS | Status: AC
  Filled 2012-03-09: qty 100

## 2012-03-09 MED ORDER — HYDROMORPHONE HCL PF 1 MG/ML IJ SOLN: 0.2500 mg | INTRAMUSCULAR | Status: DC | PRN

## 2012-03-09 MED ORDER — PROPOFOL 10 MG/ML IV BOLUS
INTRAVENOUS | Status: DC | PRN
  Administered 2012-03-09: 170 mg via INTRAVENOUS

## 2012-03-09 MED ORDER — BUPIVACAINE LIPOSOME 1.3 % IJ SUSP
INTRAMUSCULAR | Status: DC | PRN
  Administered 2012-03-09: 20 mL

## 2012-03-09 MED ORDER — BUPIVACAINE LIPOSOME 1.3 % IJ SUSP
20.0000 mL | Freq: Once | INTRAMUSCULAR | Status: DC
  Filled 2012-03-09: qty 20

## 2012-03-09 MED ORDER — AMANTADINE HCL 100 MG PO CAPS
100.0000 mg | ORAL_CAPSULE | Freq: Four times a day (QID) | ORAL | Status: DC
  Administered 2012-03-09 – 2012-03-10 (×3): 100 mg via ORAL
  Filled 2012-03-09 (×6): qty 1

## 2012-03-09 MED ORDER — SELEGILINE HCL 5 MG PO CAPS
5.0000 mg | ORAL_CAPSULE | Freq: Two times a day (BID) | ORAL | Status: DC
  Filled 2012-03-09 (×2): qty 1

## 2012-03-09 MED ORDER — NAPROXEN SODIUM 275 MG PO TABS
275.0000 mg | ORAL_TABLET | Freq: Two times a day (BID) | ORAL | Status: DC | PRN
  Filled 2012-03-09: qty 2

## 2012-03-09 MED ORDER — OXYCODONE-ACETAMINOPHEN 5-325 MG PO TABS: 1.0000 | ORAL_TABLET | ORAL | Status: DC | PRN

## 2012-03-09 MED ORDER — LOSARTAN POTASSIUM 50 MG PO TABS
50.0000 mg | ORAL_TABLET | Freq: Two times a day (BID) | ORAL | Status: DC
  Administered 2012-03-09 – 2012-03-10 (×2): 50 mg via ORAL
  Filled 2012-03-09 (×3): qty 1

## 2012-03-09 MED ORDER — CARBIDOPA-LEVODOPA 50-200 MG PO TBCR: 1.0000 | EXTENDED_RELEASE_TABLET | Freq: Four times a day (QID) | ORAL | Status: DC

## 2012-03-09 MED ORDER — LACTATED RINGERS IV SOLN: INTRAVENOUS | Status: DC

## 2012-03-09 MED ORDER — CARBIDOPA-LEVODOPA ER 50-200 MG PO TBCR
1.0000 | EXTENDED_RELEASE_TABLET | ORAL | Status: DC
  Administered 2012-03-09 – 2012-03-10 (×2): 1 via ORAL
  Filled 2012-03-09 (×6): qty 1

## 2012-03-09 MED ORDER — MORPHINE SULFATE 2 MG/ML IJ SOLN: 2.0000 mg | INTRAMUSCULAR | Status: DC | PRN

## 2012-03-09 MED ORDER — CEFAZOLIN SODIUM-DEXTROSE 2-3 GM-% IV SOLR
2.0000 g | INTRAVENOUS | Status: AC
  Administered 2012-03-09: 2 g via INTRAVENOUS

## 2012-03-09 MED ORDER — CHLORHEXIDINE GLUCONATE 4 % EX LIQD
1.0000 "application " | Freq: Once | CUTANEOUS | Status: DC
  Filled 2012-03-09: qty 15

## 2012-03-09 MED ORDER — OXYCODONE-ACETAMINOPHEN 5-325 MG PO TABS
1.0000 | ORAL_TABLET | ORAL | Status: DC | PRN
  Administered 2012-03-10: 1 via ORAL
  Filled 2012-03-09: qty 1

## 2012-03-09 MED ORDER — ACETAMINOPHEN 10 MG/ML IV SOLN
INTRAVENOUS | Status: DC | PRN
  Administered 2012-03-09: 1000 mg via INTRAVENOUS

## 2012-03-09 MED ORDER — LACTATED RINGERS IV SOLN
INTRAVENOUS | Status: DC | PRN
  Administered 2012-03-09 (×2): via INTRAVENOUS

## 2012-03-09 MED ORDER — LIDOCAINE-EPINEPHRINE 1 %-1:100000 IJ SOLN
INTRAMUSCULAR | Status: AC
  Filled 2012-03-09: qty 1

## 2012-03-09 MED ORDER — LIDOCAINE HCL (CARDIAC) 20 MG/ML IV SOLN
INTRAVENOUS | Status: DC | PRN
  Administered 2012-03-09: 50 mg via INTRAVENOUS

## 2012-03-09 MED ORDER — ROCURONIUM BROMIDE 100 MG/10ML IV SOLN
INTRAVENOUS | Status: DC | PRN
  Administered 2012-03-09: 10 mg via INTRAVENOUS
  Administered 2012-03-09: 40 mg via INTRAVENOUS

## 2012-03-09 MED ORDER — ESCITALOPRAM OXALATE 10 MG PO TABS
10.0000 mg | ORAL_TABLET | Freq: Every evening | ORAL | Status: DC
  Filled 2012-03-09: qty 1

## 2012-03-09 MED ORDER — CELECOXIB 200 MG PO CAPS
200.0000 mg | ORAL_CAPSULE | Freq: Every day | ORAL | Status: DC
  Administered 2012-03-09 – 2012-03-10 (×2): 200 mg via ORAL
  Filled 2012-03-09 (×2): qty 1

## 2012-03-09 MED ORDER — HEPARIN SODIUM (PORCINE) 5000 UNIT/ML IJ SOLN
5000.0000 [IU] | Freq: Three times a day (TID) | INTRAMUSCULAR | Status: DC
  Filled 2012-03-09 (×3): qty 1

## 2012-03-09 MED ORDER — CARBIDOPA-LEVODOPA 25-100 MG PO TABS
1.0000 | ORAL_TABLET | ORAL | Status: DC
  Filled 2012-03-09 (×2): qty 1

## 2012-03-09 MED ORDER — ONDANSETRON HCL 4 MG PO TABS: 4.0000 mg | ORAL_TABLET | Freq: Four times a day (QID) | ORAL | Status: DC | PRN

## 2012-03-09 MED ORDER — CARBIDOPA-LEVODOPA CR 25-100 MG PO TBCR
1.0000 | EXTENDED_RELEASE_TABLET | Freq: Every day | ORAL | Status: DC
  Administered 2012-03-10: 1 via ORAL
  Filled 2012-03-09 (×2): qty 1

## 2012-03-09 MED ORDER — ONDANSETRON HCL 4 MG/2ML IJ SOLN: 4.0000 mg | Freq: Four times a day (QID) | INTRAMUSCULAR | Status: DC | PRN

## 2012-03-09 MED ORDER — FLUTICASONE PROPIONATE 50 MCG/ACT NA SUSP
2.0000 | Freq: Every day | NASAL | Status: DC
  Administered 2012-03-10: 2 via NASAL
  Filled 2012-03-09: qty 16

## 2012-03-09 MED ORDER — POTASSIUM CHLORIDE IN NACL 20-0.9 MEQ/L-% IV SOLN
INTRAVENOUS | Status: DC
  Administered 2012-03-09: 18:00:00 via INTRAVENOUS
  Filled 2012-03-09 (×2): qty 1000

## 2012-03-09 MED ORDER — FENTANYL CITRATE 0.05 MG/ML IJ SOLN
INTRAMUSCULAR | Status: DC | PRN
  Administered 2012-03-09 (×2): 100 ug via INTRAVENOUS
  Administered 2012-03-09: 50 ug via INTRAVENOUS

## 2012-03-09 MED ORDER — POLYETHYLENE GLYCOL 3350 17 G PO PACK
17.0000 g | PACK | Freq: Every day | ORAL | Status: DC | PRN
  Filled 2012-03-09: qty 1

## 2012-03-09 MED ORDER — CEFAZOLIN SODIUM-DEXTROSE 2-3 GM-% IV SOLR
INTRAVENOUS | Status: AC
  Filled 2012-03-09: qty 50

## 2012-03-09 MED ORDER — ONDANSETRON HCL 4 MG/2ML IJ SOLN
INTRAMUSCULAR | Status: DC | PRN
  Administered 2012-03-09: 4 mg via INTRAVENOUS

## 2012-03-09 MED ORDER — KETOCONAZOLE 2 % EX CREA
1.0000 "application " | TOPICAL_CREAM | Freq: Every day | CUTANEOUS | Status: DC
  Administered 2012-03-10: 1 via TOPICAL
  Filled 2012-03-09: qty 15

## 2012-03-09 MED ORDER — 0.9 % SODIUM CHLORIDE (POUR BTL) OPTIME
TOPICAL | Status: DC | PRN
  Administered 2012-03-09: 1000 mL

## 2012-03-09 SURGICAL SUPPLY — 55 items
ADH SKN CLS APL DERMABOND .7 (GAUZE/BANDAGES/DRESSINGS) ×1
APL SKNCLS STERI-STRIP NONHPOA (GAUZE/BANDAGES/DRESSINGS)
BENZOIN TINCTURE PRP APPL 2/3 (GAUZE/BANDAGES/DRESSINGS) IMPLANT
BLADE HEX COATED 2.75 (ELECTRODE) ×2 IMPLANT
BLADE SURG 15 STRL LF DISP TIS (BLADE) ×1 IMPLANT
BLADE SURG 15 STRL SS (BLADE) ×2
BLADE SURG ROTATE 9660 (MISCELLANEOUS) IMPLANT
BLADE SURG SZ10 CARB STEEL (BLADE) ×1 IMPLANT
CANISTER SUCTION 2500CC (MISCELLANEOUS) ×2 IMPLANT
CLOTH BEACON ORANGE TIMEOUT ST (SAFETY) ×2 IMPLANT
DECANTER SPIKE VIAL GLASS SM (MISCELLANEOUS) ×2 IMPLANT
DERMABOND ADVANCED (GAUZE/BANDAGES/DRESSINGS) ×1
DERMABOND ADVANCED .7 DNX12 (GAUZE/BANDAGES/DRESSINGS) IMPLANT
DRAIN PENROSE 18X1/2 LTX STRL (DRAIN) ×1 IMPLANT
DRAPE LAPAROTOMY TRNSV 102X78 (DRAPE) ×2 IMPLANT
ELECT REM PT RETURN 9FT ADLT (ELECTROSURGICAL) ×2
ELECTRODE REM PT RTRN 9FT ADLT (ELECTROSURGICAL) ×1 IMPLANT
GLOVE BIOGEL PI IND STRL 6.5 (GLOVE) IMPLANT
GLOVE BIOGEL PI IND STRL 7.0 (GLOVE) ×1 IMPLANT
GLOVE BIOGEL PI INDICATOR 6.5 (GLOVE) ×1
GLOVE BIOGEL PI INDICATOR 7.0 (GLOVE) ×1
GLOVE EUDERMIC 7 POWDERFREE (GLOVE) ×3 IMPLANT
GLOVE SURG SS PI 6.5 STRL IVOR (GLOVE) ×1 IMPLANT
GOWN STRL NON-REIN LRG LVL3 (GOWN DISPOSABLE) ×3 IMPLANT
GOWN STRL REIN XL XLG (GOWN DISPOSABLE) ×3 IMPLANT
KIT BASIN OR (CUSTOM PROCEDURE TRAY) ×2 IMPLANT
MESH HERNIA 3X6 (Mesh General) ×1 IMPLANT
NDL HYPO 25X1 1.5 SAFETY (NEEDLE) ×1 IMPLANT
NEEDLE HYPO 22GX1.5 SAFETY (NEEDLE) ×1 IMPLANT
NEEDLE HYPO 25X1 1.5 SAFETY (NEEDLE) IMPLANT
NS IRRIG 1000ML POUR BTL (IV SOLUTION) ×2 IMPLANT
PACK BASIC VI WITH GOWN DISP (CUSTOM PROCEDURE TRAY) ×2 IMPLANT
PENCIL BUTTON HOLSTER BLD 10FT (ELECTRODE) ×2 IMPLANT
SPONGE GAUZE 4X4 12PLY (GAUZE/BANDAGES/DRESSINGS) IMPLANT
SPONGE LAP 4X18 X RAY DECT (DISPOSABLE) ×3 IMPLANT
STAPLER VISISTAT 35W (STAPLE) IMPLANT
STRIP CLOSURE SKIN 1/2X4 (GAUZE/BANDAGES/DRESSINGS) IMPLANT
SUT MNCRL AB 4-0 PS2 18 (SUTURE) ×2 IMPLANT
SUT PROLENE 2 0 CT2 30 (SUTURE) ×7 IMPLANT
SUT SILK 2 0 (SUTURE) ×2
SUT SILK 2 0 SH (SUTURE) ×1 IMPLANT
SUT SILK 2-0 18XBRD TIE 12 (SUTURE) ×1 IMPLANT
SUT SURGILON 0 BLK (SUTURE) ×2 IMPLANT
SUT VIC AB 2-0 CT2 27 (SUTURE) ×1 IMPLANT
SUT VIC AB 2-0 SH 27 (SUTURE) ×2
SUT VIC AB 2-0 SH 27X BRD (SUTURE) ×1 IMPLANT
SUT VIC AB 3-0 SH 27 (SUTURE) ×2
SUT VIC AB 3-0 SH 27XBRD (SUTURE) ×1 IMPLANT
SUT VICRYL 2 0 18  UND BR (SUTURE) ×1
SUT VICRYL 2 0 18 UND BR (SUTURE) IMPLANT
SYR 20CC LL (SYRINGE) ×1 IMPLANT
SYR BULB IRRIGATION 50ML (SYRINGE) ×2 IMPLANT
SYR CONTROL 10ML LL (SYRINGE) ×1 IMPLANT
TOWEL OR 17X26 10 PK STRL BLUE (TOWEL DISPOSABLE) ×2 IMPLANT
YANKAUER SUCT BULB TIP 10FT TU (MISCELLANEOUS) IMPLANT

## 2012-03-09 NOTE — Transfer of Care (Signed)
Immediate Anesthesia Transfer of Care Note  Patient: [name]  Procedure(s) Performed: Procedure(s) (LRB) with comments: HERNIA REPAIR INGUINAL ADULT (Right) INSERTION OF MESH (Right)  Patient Location: PACU  Anesthesia Type:General  Level of Consciousness: awake, alert  and oriented  Airway & Oxygen Therapy: Patient Spontanous Breathing and Patient connected to face mask oxygen  Post-op Assessment: Report given to PACU RN and Post -op Vital signs reviewed and stable  Post vital signs: Reviewed and stable  Complications: No apparent anesthesia complications

## 2012-03-09 NOTE — Interval H&P Note (Signed)
History and Physical Interval Note:  03/09/2012 2:15 PM  [name]  has presented today for surgery, with the diagnosis of right inguinal hernia  The goals and the   various methods of treatment have been discussed with the patient and family. After consideration of risks, benefits and other options for treatment, the patient has consented to  Procedure(s) (LRB) with comments: HERNIA REPAIR INGUINAL ADULT (Right) INSERTION OF MESH (Right) as a surgical intervention .  The patient's history has been reviewed, patient examined today, no change in status, stable for surgery.  I have reviewed the patient's chart and labs.  Questions were answered to the patient's satisfaction.     Ernestene Mention

## 2012-03-09 NOTE — Anesthesia Preprocedure Evaluation (Addendum)
Anesthesia Evaluation  Patient identified by MRN, date of birth, ID band Patient awake    Reviewed: Allergy & Precautions, H&P , NPO status , Patient's Chart, lab work & pertinent test results  Airway Mallampati: II TM Distance: >3 FB Neck ROM: full    Dental No notable dental hx. (+) Teeth Intact and Dental Advisory Given   Pulmonary neg pulmonary ROS,  breath sounds clear to auscultation  Pulmonary exam normal       Cardiovascular Exercise Tolerance: Good hypertension, Pt. on medications negative cardio ROS  Rhythm:regular Rate:Normal     Neuro/Psych Depression Parkinson's disease  Neuromuscular disease negative neurological ROS  negative psych ROS   GI/Hepatic negative GI ROS, Neg liver ROS,   Endo/Other  negative endocrine ROS  Renal/GU negative Renal ROS  negative genitourinary   Musculoskeletal   Abdominal   Peds  Hematology negative hematology ROS (+)   Anesthesia Other Findings   Reproductive/Obstetrics negative OB ROS                          Anesthesia Physical Anesthesia Plan  ASA: III  Anesthesia Plan: General   Post-op Pain Management:    Induction: Intravenous  Airway Management Planned: LMA  Additional Equipment:   Intra-op Plan:   Post-operative Plan:   Informed Consent: I have reviewed the patients History and Physical, chart, labs and discussed the procedure including the risks, benefits and alternatives for the proposed anesthesia with the patient or authorized representative who has indicated his/her understanding and acceptance.   Dental Advisory Given  Plan Discussed with: CRNA and Surgeon  Anesthesia Plan Comments:        Anesthesia Quick Evaluation

## 2012-03-09 NOTE — Op Note (Signed)
Patient Name:           [name]   Date of Surgery:        03/09/2012  Pre op Diagnosis:      Right inguinal hernia  Post op Diagnosis:    Large, direct right inguinal hernia  Procedure:                 Cooper's ligament repair of right inguinal hernia with polypropylene mesh  Surgeon:                     Angelia Mould. Derrell Lolling, M.D., FACS  Assistant:                      None  Operative Indications:   This is a pleasant gentleman who has significant problems with parkinsonism, and neurogenic bladder, BPH, and chronic back pain from spinal disease. I saw him back in May and he wanted to postpone the right inguinal hernia repair while he underwent neurologic workup at [facility]. He has completed that workup.  On September 29, he had an episode of severe pain. The hernia became larger, the size of a baseball, became hard and tender. He has no nausea or vomiting. He took a Microbiologist and laid down and went to sleep and the pain went away. Since that time he feels a bulge but he is eating well and having normal bowel movements. The lump is much smaller. On exam he has a moderate-sized right inguinal hernia that does not extend into the scrotum. I can reduce this with the patient  placed supine. No scrotal mass. Testes slightly atrophic. No hernia on the left. Despite his neurologic problems, he remains pretty active. He now wants to have something done in the near future   Operative Findings:       He had a large, direct right inguinal hernia. A significant area of the inguinal floor medial to the internal ring was completely gone and bulging out. This required a Cooper's ligament repair to repair this securely.  Procedure in Detail:          Following the induction of general endotracheal anesthesia the patient's lower abdomen, groins and genitalia were prepped and draped in a sterile fashion. Intravenous antibiotics were given. Surgical time out was performed. A 50% solution Exparel was used as a  local infiltration anesthetic.A transverse incision was made in the right groin;  dissection was carried down to the external oblique.. I dissected out the external oblique until I identified the external inguinal ring. The external oblique was in incised in the direction of its fibers opening up the external inguinal ring. I generously dissected the external oblique away from the underlying structures and placed self-retaining  retractors. 1 sensory nerve was clamped divided and ligated with 2-0 silk tie laterally. I dissected out the cord structures away from the large direct hernia bulge. He did not have an indirect hernia. After studying the anatomy of the defect I chose to repair this with a Cooper's ligament repair. I brought a 3" x 6" piece of polypropylene mesh to the operative field. Starting at the pubic tubercle I placed several interrupted mattress sutures of 0 Surgilon going down with 4 more sutures into Cooper's ligament. The most lateral suture was then brought up through the inguinal ligament as a transition stitch. I then carefully tied all the sutures individually under direct vision. Laterally I placed several more  interrupted sutures of Surgilon  anchoring the mesh laterally to the inguinal ligament. Medially, superiorly and superolaterally I placed numerous mattress sutures of 2-0 Prolene. A relaxing incision was made very high on the internal oblique . The mesh was incised laterally so as to wrap around the cord structures at the internal ring. The tails of the mesh were overlapped laterally and several other sutures were placed. This provided very secure repair and coverage both medial and lateral to the internal ring but allowed an adequate fingertip opening for the cord structures. It was irrigated with saline. There was no bleeding. The external oblique was closed with a running suture of 2-0 Vicryl. Scarpa's fascia was closed with 3-0 Vicryl sutures and the skin closed with a running  subcuticular suture of 4-0 Monocryl and Dermabond. Ice pack was placed. Patient tolerated the procedure well. Taken to recovery in stable condition. EBL 15 cc. Counts correct.     Angelia Mould. Derrell Lolling, M.D., FACS General and Minimally Invasive Surgery Breast and Colorectal Surgery  03/09/2012 4:05 PM

## 2012-03-09 NOTE — Anesthesia Postprocedure Evaluation (Signed)
  Anesthesia Post-op Note  Patient: [name]  Procedure(s) Performed: Procedure(s) (LRB): HERNIA REPAIR INGUINAL ADULT (Right) INSERTION OF MESH (Right)  Patient Location: PACU  Anesthesia Type: General  Level of Consciousness: awake and alert   Airway and Oxygen Therapy: Patient Spontanous Breathing  Post-op Pain: mild  Post-op Assessment: Post-op Vital signs reviewed, Patient's Cardiovascular Status Stable, Respiratory Function Stable, Patent Airway and No signs of Nausea or vomiting  Last Vitals:  Filed Vitals:   03/09/12 1630  BP: 141/63  Pulse: 70  Temp:   Resp: 17    Post-op Vital Signs: stable   Complications: No apparent anesthesia complications

## 2012-03-10 MED ORDER — HYDROCODONE-ACETAMINOPHEN 5-325 MG PO TABS: 1.0000 | ORAL_TABLET | ORAL | Status: DC | PRN

## 2012-03-10 NOTE — Discharge Summary (Signed)
Patient ID: [name] [id number] 78 y.o. 1933/09/27  03/09/2012  Discharge date and time: March 10, 2012  Admitting Physician: Ernestene Mention  Discharge Physician: Ernestene Mention  Admission Diagnoses: right inguinal hernia  Discharge Diagnoses: Same  Operations: Procedure(s): HERNIA REPAIR INGUINAL ADULT INSERTION OF MESH  Admission Condition: good  Discharged Condition: good  Indication for Admission: [name] is a 78 y.o. male. He Has had a right inguinal hernia for some time, but has been postponing repair to pursue workup of his neurologic problems..  This is a pleasant gentleman who has significant problems with parkinsonism, and neurogenic bladder, BPH, and chronic back pain from spinal disease. I saw him back in May and he wanted to postpone the hernia repair while he underwent neurologic workup at [facility]. He has completed that workup.  On September 29, he had an episode of severe pain. The hernia became larger, the size of a baseball, became hard and tender. He has no nausea or vomiting. He took a Microbiologist and laid down and went to sleep and the pain went away. Since that time he feels a bulge but he is eating well and having normal bowel movements. The lump is much smaller.On exam he has a medium-sized hernia that is reducible when supine. He is brought to the operating room electively.   Hospital Course: On the day of admission the patient was taken to the operating room and underwent repair of his right inguinal hernia with mesh under general anesthesia. I found that he had a very large defect in the floor of the inguinal inguinal canal, essentially a large direct hernia. I used polypropylene mesh and did a Cooper's ligament repair to get a secure closure. That went well. Postop he did well. He has a neurogenic bladder but was voiding in typical baseline fashion. He was tolerating diet. Pain was under control. He was able to get up and walk around. He fell  ready to go  home the following morning. At the time of discharge his right groin incision was looking good. No swelling no hematoma skin edges healthy. He was given instructions in diet and activities. He was given a prescription for Vicodin. He is to return to see me in the office the first week in December.  Consults: None  Significant Diagnostic Studies: none  Treatments: surgery: Cooper's ligament repair right inguinal hernia with mesh.  Disposition: Home  Patient InstructionsRichad, [name]  Home Medication Instructions WUJ:[id number]   Printed on:03/10/12 0702  Medication Information                    fexofenadine (ALLEGRA) 180 MG tablet Take 180 mg by mouth daily.            amantadine (SYMMETREL) 100 MG capsule Take 100 mg by mouth 4 (four) times daily.            aspirin 81 MG tablet Take 162 mg by mouth daily.            carbidopa-levodopa (SINEMET) 25-100 MG per tablet Take 1 tablet by mouth 2 (two) times daily. Middle of day and later in afternoon           carbidopa-levodopa (SINEMET CR) 50-200 MG per tablet Take 1-1.5 tablets by mouth 4 (four) times daily. 1 & 1/2 tablets at 8 am, 1 tablet at 12 pm, 1 tablet at 4 pm and 1 tablet at 8 pm           fluticasone (  FLONASE) 50 MCG/ACT nasal spray Place 2 sprays into the nose daily.           oxyCODONE-acetaminophen (PERCOCET) 5-325 MG per tablet Take 1 tablet by mouth every 4 (four) hours as needed. For back pain           Multiple Vitamin (MULTIVITAMIN WITH MINERALS) TABS Take 1 tablet by mouth daily.           celecoxib (CELEBREX) 200 MG capsule Take 1 capsule (200 mg total) by mouth daily.           losartan (COZAAR) 50 MG tablet Take 1 tablet (50 mg total) by mouth 2 (two) times daily.           selegiline (ELDEPRYL) 5 MG capsule Take 5 mg by mouth 2 (two) times daily before a meal.            escitalopram (LEXAPRO) 10 MG tablet Take 10 mg by mouth every evening.           polyethylene glycol (MIRALAX  / GLYCOLAX) packet Take 17 g by mouth daily as needed. For laxative           naproxen sodium (ANAPROX) 220 MG tablet Take 220-440 mg by mouth 2 (two) times daily as needed. For pain           ketoconazole (NIZORAL) 2 % cream Apply 1 application topically daily. Use on forehead           atorvastatin (LIPITOR) 40 MG tablet Take 40 mg by mouth daily after supper.           HYDROcodone-acetaminophen (NORCO/VICODIN) 5-325 MG per tablet Take 1-2 tablets by mouth every 4 (four) hours as needed for pain.             Activity: ambulate a lot. No sports or lifting for 6 weeks. Do not drive a car for 1-61 days. Diet: low fat, low cholesterol diet Wound Care: none needed  Follow-up:  With Dr. Derrell Lolling in 2 weeks.  Signed: Angelia Mould. Derrell Lolling, M.D., FACS General and minimally invasive surgery Breast and Colorectal Surgery  03/10/2012, 7:02 AM

## 2012-03-10 NOTE — Progress Notes (Signed)
1 Day Post-Op  Subjective: Stable and alert. No nausea or vomiting. Tolerating by mouth hisVoiding uneventfully. Pain control seems good. Mental status seems good.  Objective: Vital signs in last 24 hours: Temp:  [97.3 F (36.3 C)-98.4 F (36.9 C)] 98.4 F (36.9 C) (11/21 0500) Pulse Rate:  [65-80] 71  (11/21 0500) Resp:  [16-18] 16  (11/21 0500) BP: (128-160)/(63-89) 137/74 mmHg (11/21 0500) SpO2:  [96 %-100 %] 98 % (11/21 0500) Weight:  [195 lb (88.451 kg)] 195 lb (88.451 kg) (11/20 1705) Last BM Date: 03/08/12  Intake/Output from previous day: 11/20 0701 - 11/21 0700 In: 2307.5 [P.O.:120; I.V.:2187.5] Out: 775 [Urine:775] Intake/Output this shift: Total I/O In: 1007.5 [P.O.:120; I.V.:887.5] Out: 775 [Urine:775]  General appearance: mental status normal. Alert. Spirits good. In no distress. Resp: clear to auscultation bilaterally GI: Abdomen soft, nontender, nondistended. Right groin incision soft. No hematoma. Repair intact. Penis scrotum testes normal.  Lab Results:  No results found for this or any previous visit (from the past 24 hour(s)).   Studies/Results: @RISRSLT24 @     . amantadine  100 mg Oral QID  . carbidopa-levodopa  1 tablet Oral Custom   And  . carbidopa-levodopa  1 tablet Oral QAC breakfast  . carbidopa-levodopa  1 tablet Oral Custom  . [COMPLETED]  ceFAZolin (ANCEF) IV  2 g Intravenous On Call to OR  . celecoxib  200 mg Oral Daily  . escitalopram  10 mg Oral QPM  . fluticasone  2 spray Each Nare Daily  . heparin  5,000 Units Subcutaneous Q8H  . ketoconazole  1 application Topical Daily  . losartan  50 mg Oral BID  . selegiline  5 mg Oral BID AC  . [DISCONTINUED] bupivacaine liposome  20 mL Infiltration Once  . [DISCONTINUED] carbidopa-levodopa  1-1.5 tablet Oral QID  . [DISCONTINUED] chlorhexidine  1 application Topical Once     Assessment/Plan: s/p Procedure(s): HERNIA REPAIR INGUINAL ADULT INSERTION OF MESH  POD #1. Cooper's ligament  repair large right inguinal hernia with mesh. Doing well Discharge home today. Diet and activities discussed  Parkinson's disease, stable  Neurogenic bladder  BPH  Spinal column disease requiring injections    LOS: 1 day    Lean Fayson M. Derrell Lolling, M.D., [facility] General and Minimally invasive Surgery Breast and Colorectal Surgery Office:   [phone] Pager:   [phone]  03/10/2012  . .prob

## 2012-03-11 ENCOUNTER — Encounter (HOSPITAL_COMMUNITY): Payer: Self-pay | Admitting: General Surgery

## 2012-03-21 ENCOUNTER — Ambulatory Visit (INDEPENDENT_AMBULATORY_CARE_PROVIDER_SITE_OTHER): Payer: Medicare Other | Admitting: General Surgery

## 2012-03-21 ENCOUNTER — Encounter (INDEPENDENT_AMBULATORY_CARE_PROVIDER_SITE_OTHER): Payer: Self-pay | Admitting: General Surgery

## 2012-03-21 VITALS — BP 134/78 | HR 76 | Temp 97.3°F | Resp 16 | Ht 71.0 in | Wt 201.0 lb

## 2012-03-21 DIAGNOSIS — K4090 Unilateral inguinal hernia, without obstruction or gangrene, not specified as recurrent: Secondary | ICD-10-CM

## 2012-03-21 NOTE — Progress Notes (Signed)
Patient ID: [name], male   DOB: 1933/11/18, 78 y.o.   MRN: [id number] History: This patient underwent open repair of a complex right inguinal hernia with mesh on 03/09/2012. This was a large defect, essentially the entire floor of the inguinal canal was destroyed. I repaired this with a Cooper's ligament repair using polypropylene mesh. Postoperatively he has done relatively well. Pain has subsided. He states he took a 1 mile walk yesterday. Normal bowel and bladder habits. Bruising has resolved.  Exam: Patient looks well. No distress. Right groin incision normal amount of thickening. No infection. No seroma. Hernia repair appears intact. Penis scrotum and testes looked normal.  Assessment: Complex, large right inguinal hernia, recovering uneventfully following Cooper's ligament repair with mesh Parkinson's disease. Neurogenic bladder Hypertension  Plan: Diet and activities discussed. No sports or lifting for 8 weeks from the date of surgery. Return to see me in 4 months to make sure the hernia repair is intact.    Angelia Mould. Derrell Lolling, M.D., [facility] General and Minimally invasive Surgery Breast and Colorectal Surgery Office:   [phone] Pager:   [phone]

## 2012-03-21 NOTE — Patient Instructions (Signed)
Your right inguinal hernia was a large hernia, and a complex hernia. We repaired this with a Cooper's ligament repair using mesh. It appears to be healing without any obvious surgical complications today.  I encouraged you to take a walk around the block daily. You may drive her car when you are comfortable.  No sports or lifting more than 20 pounds for 6-8 weeks from the date of surgery  Return to see Dr. Derrell Lolling in 4 months for a wound check.

## 2012-03-23 ENCOUNTER — Ambulatory Visit (INDEPENDENT_AMBULATORY_CARE_PROVIDER_SITE_OTHER): Payer: Medicare Other | Admitting: Family Medicine

## 2012-03-23 ENCOUNTER — Encounter: Payer: Self-pay | Admitting: Family Medicine

## 2012-03-23 VITALS — BP 140/80 | HR 76 | Temp 97.9°F | Ht 68.75 in | Wt 201.2 lb

## 2012-03-23 DIAGNOSIS — I10 Essential (primary) hypertension: Secondary | ICD-10-CM

## 2012-03-23 DIAGNOSIS — E785 Hyperlipidemia, unspecified: Secondary | ICD-10-CM

## 2012-03-23 DIAGNOSIS — Z0000 Encounter for general adult medical examination without abnormal findings: Secondary | ICD-10-CM

## 2012-03-23 DIAGNOSIS — R350 Frequency of micturition: Secondary | ICD-10-CM

## 2012-03-23 LAB — CBC WITH DIFFERENTIAL/PLATELET
Basophils Absolute: 0.1 10*3/uL (ref 0.0–0.1)
HCT: 47.2 % (ref 39.0–52.0)
Lymphs Abs: 1.9 10*3/uL (ref 0.7–4.0)
MCV: 92.6 fl (ref 78.0–100.0)
Monocytes Absolute: 0.6 10*3/uL (ref 0.1–1.0)
Monocytes Relative: 7.6 % (ref 3.0–12.0)
Platelets: 294 10*3/uL (ref 150.0–400.0)
RDW: 14.2 % (ref 11.5–14.6)

## 2012-03-23 LAB — HEPATIC FUNCTION PANEL
Alkaline Phosphatase: 121 U/L — ABNORMAL HIGH (ref 39–117)
Bilirubin, Direct: 0.3 mg/dL (ref 0.0–0.3)

## 2012-03-23 LAB — LIPID PANEL
HDL: 43.2 mg/dL (ref >39.00)
LDL Cholesterol: 76 mg/dL (ref 0–99)
Total CHOL/HDL Ratio: 3
Triglycerides: 57 mg/dL (ref 0.0–149.0)
VLDL: 11.4 mg/dL (ref 0.0–40.0)

## 2012-03-23 LAB — BASIC METABOLIC PANEL
Calcium: 9.4 mg/dL (ref 8.4–10.5)
GFR: 87.21 mL/min (ref >60.00)
Sodium: 137 mEq/L (ref 135–145)

## 2012-03-23 NOTE — Patient Instructions (Addendum)
Follow up in 6 months to recheck BP and cholesterol We'll notify you of your lab results and make any changes if needed Keep up the good work!  You look great! Call with any questions or concerns Happy Holidays and early birthday!!!

## 2012-03-23 NOTE — Progress Notes (Signed)
  Subjective:    Patient ID: [name], male    DOB: 1933/06/18, 78 y.o.   MRN: [id number]  HPI Here today for CPE.  Risk Factors: HTN- chronic problem, BP slightly elevated.  On Cozaar twice daily.  Denies CP, SOB, HAs, visual changes, edema. Hyperlipidemia- chronic problem, on Lipitor.  Denies abd pain, N/V, myalgias. Physical Activity: walking regularly, stretching Fall Risk: elevated due to parkinson's Depression: chronic problem but well controlled on Lexapro Hearing: normal to conversational tones, mildly decreased to whispered voice ADL's: independent Cognitive: normal linear thought process, memory and attention intact Home Safety: lives w/ wife, feels safe at home Height, Weight, BMI, Visual Acuity: see vitals, vision corrected to 20/20 w/ glasses Counseling: UTD on colonoscopy Labs Ordered: See A&P Care Plan: See A&P    Review of Systems Patient reports no hearing changes, anorexia, fever ,adenopathy, persistant/recurrent hoarseness, swallowing issues, chest pain, palpitations, edema, persistant/recurrent cough, hemoptysis, dyspnea (rest,exertional, paroxysmal nocturnal), gastrointestinal  bleeding (melena, rectal bleeding), abdominal pain, excessive heart burn, GU symptoms (dysuria, hematuria, voiding/incontinence issues) syncope, focal weakness, memory loss, skin/hair/nail changes, depression, anxiety, abnormal bruising/bleeding, musculoskeletal symptoms/signs.   + blurry vision, due for eye exam. + L lateral thigh numbness    Objective:   Physical Exam BP 140/80  Pulse 76  Temp 97.9 F (36.6 C) (Oral)  Ht 5' 8.75" (1.746 m)  Wt 201 lb 3.2 oz (91.264 kg)  BMI 29.93 kg/m2  SpO2 96%  General Appearance:    Alert, cooperative, no distress, appears stated age  Head:    Normocephalic, without obvious abnormality, atraumatic  Eyes:    PERRL, conjunctiva/corneas clear, EOM's intact, fundi    benign, both eyes       Ears:    Normal TM's and external ear canals, both  ears  Nose:   Nares normal, septum midline, mucosa normal, no drainage   or sinus tenderness  Throat:   Lips, mucosa, and tongue normal; teeth and gums normal  Neck:   Supple, symmetrical, trachea midline, no adenopathy;       thyroid:  No enlargement/tenderness/nodules; no carotid   bruit or JVD  Back:     Symmetric, no curvature, ROM normal, no CVA tenderness  Lungs:     Clear to auscultation bilaterally, respirations unlabored  Chest wall:    No tenderness or deformity  Heart:    Regular rate and rhythm, S1 and S2 normal, no murmur, rub   or gallop  Abdomen:     Soft, non-tender, bowel sounds active all four quadrants,    no masses, no organomegaly  Genitalia:    Deferred to urology  Rectal:    Extremities:   Extremities normal, atraumatic, no cyanosis or edema  Pulses:   2+ and symmetric all extremities  Skin:   Skin color, texture, turgor normal, no rashes or lesions  Lymph nodes:   Cervical, supraclavicular, and axillary nodes normal  Neurologic:   CNII-XII intact. R sided pill rolling tremor w/ diffuse rigidity          Assessment & Plan:

## 2012-04-17 NOTE — Assessment & Plan Note (Signed)
Chronic problem.  Taking meds as directed.  Slightly elevated today.  No med changes but will follow closely.

## 2012-04-17 NOTE — Assessment & Plan Note (Signed)
[name] PE WNL w/ exception of known Parkinson's tremor and rigidity.  UTD on health maintenance.  Encouraged eye exam.  Check labs.  Anticipatory guidance provided.

## 2012-04-17 NOTE — Assessment & Plan Note (Signed)
Chronic problem, tolerating meds w/out difficulty.  Check labs.  Adjust meds prn  

## 2012-04-20 HISTORY — PX: CATARACT EXTRACTION W/ INTRAOCULAR LENS  IMPLANT, BILATERAL: SHX1307

## 2012-05-07 ENCOUNTER — Other Ambulatory Visit: Payer: Self-pay | Admitting: Family Medicine

## 2012-05-10 NOTE — Telephone Encounter (Signed)
Last OV 03-23-12, last filled 01-07-12 #30 3

## 2012-06-07 ENCOUNTER — Other Ambulatory Visit: Payer: Self-pay | Admitting: Family Medicine

## 2012-07-04 ENCOUNTER — Encounter (INDEPENDENT_AMBULATORY_CARE_PROVIDER_SITE_OTHER): Payer: Self-pay | Admitting: General Surgery

## 2012-08-09 ENCOUNTER — Other Ambulatory Visit: Payer: Self-pay | Admitting: Family Medicine

## 2012-08-18 ENCOUNTER — Encounter: Payer: Self-pay | Admitting: Internal Medicine

## 2012-08-18 ENCOUNTER — Encounter (INDEPENDENT_AMBULATORY_CARE_PROVIDER_SITE_OTHER): Payer: Medicare Other | Admitting: General Surgery

## 2012-10-11 ENCOUNTER — Encounter (INDEPENDENT_AMBULATORY_CARE_PROVIDER_SITE_OTHER): Payer: Self-pay | Admitting: General Surgery

## 2012-10-11 ENCOUNTER — Ambulatory Visit (INDEPENDENT_AMBULATORY_CARE_PROVIDER_SITE_OTHER): Payer: Medicare Other | Admitting: General Surgery

## 2012-10-11 VITALS — BP 150/90 | HR 76 | Temp 97.8°F | Resp 16 | Ht 69.0 in | Wt 191.4 lb

## 2012-10-11 DIAGNOSIS — K4090 Unilateral inguinal hernia, without obstruction or gangrene, not specified as recurrent: Secondary | ICD-10-CM

## 2012-10-11 NOTE — Patient Instructions (Signed)
Your right inguinal hernia repair is intact. There do not appear to be any problems.  You can continue normal activities, including golf and gardening.  Use common sense.  Avoid very strenuous activities.  Return to see Dr. Derrell Lolling if further problems arise.

## 2012-10-11 NOTE — Progress Notes (Signed)
Patient ID: [name], male   DOB: 1933/12/06, 79 y.o.   MRN: [id number] History: This patient underwent open repair of a complex right inguinal hernia with mesh on 03/09/2012. This was a very large defect, requiring a Cooper's ligament repair with polypropylene mesh. He returns for long-term followup. He has done well. Has pain rarely.   he does a lot of gardening and basically no complaints. Asking about golf.  Exam: Patient looks well. No distress Right groin incision is healed. Tissue thickening has resolved. Tissues are soft. Hernia repair intact. Penis scrotum and testes normal  Complex, large right inguinal hernia, uneventful recovery long-term following Cooper's ligament repair with mesh Parkinson's disease Neurogenic bladder Hypertension  Plan: Activities discussed. Okay to resume golf, slowly and stepwise. Putting and short irons first. Avoid very strenuous activities or very heavy lifting. Return to see me if further problems arise.   Angelia Mould. Derrell Lolling, M.D., [facility] General and Minimally invasive Surgery Breast and Colorectal Surgery Office:   [phone] Pager:   [phone]

## 2012-11-08 ENCOUNTER — Other Ambulatory Visit: Payer: Self-pay | Admitting: Family Medicine

## 2012-11-10 ENCOUNTER — Other Ambulatory Visit: Payer: Self-pay | Admitting: Family Medicine

## 2012-11-10 NOTE — Telephone Encounter (Signed)
Last seen 03/23/12 and filled 08/09/12 #30 with 2 refills. Please advise     KP

## 2012-11-11 NOTE — Telephone Encounter (Signed)
Rx sent to the pharmacy by e-script along with note that the pt is due an OV.//AB/CMA

## 2012-11-23 ENCOUNTER — Other Ambulatory Visit: Payer: Self-pay

## 2013-01-13 ENCOUNTER — Other Ambulatory Visit: Payer: Self-pay | Admitting: Family Medicine

## 2013-01-13 NOTE — Telephone Encounter (Signed)
Med filled.  

## 2013-02-15 ENCOUNTER — Ambulatory Visit (INDEPENDENT_AMBULATORY_CARE_PROVIDER_SITE_OTHER): Payer: Medicare Other

## 2013-02-15 DIAGNOSIS — Z23 Encounter for immunization: Secondary | ICD-10-CM

## 2013-03-14 ENCOUNTER — Other Ambulatory Visit: Payer: Self-pay | Admitting: Family Medicine

## 2013-03-14 NOTE — Telephone Encounter (Signed)
Med filled.  

## 2013-04-12 ENCOUNTER — Telehealth: Payer: Self-pay

## 2013-04-12 NOTE — Telephone Encounter (Signed)
Medication List and allergies:  Reviewed and updated  90 day supply/mail order: na Local prescriptions: [facility] [address]  Immunizations due: PNA and shingles  A/P:   No changes to FH or PSH or personal hx Tdap--10/2011 Flu vaccine--01/2013 CCS--01/2009--adenmatous polyps--due 2013 PSA--03/2012 3.63--sees urology  To Discuss with Provider: Felton [name]

## 2013-04-17 ENCOUNTER — Ambulatory Visit (INDEPENDENT_AMBULATORY_CARE_PROVIDER_SITE_OTHER): Payer: Medicare Other | Admitting: Family Medicine

## 2013-04-17 ENCOUNTER — Encounter: Payer: Self-pay | Admitting: Family Medicine

## 2013-04-17 VITALS — BP 146/84 | HR 100 | Temp 98.0°F | Resp 16 | Ht 70.0 in | Wt 194.0 lb

## 2013-04-17 DIAGNOSIS — Z23 Encounter for immunization: Secondary | ICD-10-CM

## 2013-04-17 DIAGNOSIS — Z0000 Encounter for general adult medical examination without abnormal findings: Secondary | ICD-10-CM

## 2013-04-17 DIAGNOSIS — I10 Essential (primary) hypertension: Secondary | ICD-10-CM

## 2013-04-17 DIAGNOSIS — G20 Parkinson's disease: Secondary | ICD-10-CM

## 2013-04-17 DIAGNOSIS — E785 Hyperlipidemia, unspecified: Secondary | ICD-10-CM

## 2013-04-17 DIAGNOSIS — R972 Elevated prostate specific antigen [PSA]: Secondary | ICD-10-CM

## 2013-04-17 MED ORDER — LOSARTAN POTASSIUM 100 MG PO TABS: 100.0000 mg | ORAL_TABLET | Freq: Every day | ORAL | Status: DC

## 2013-04-17 NOTE — Progress Notes (Signed)
   Subjective:    Patient ID: [name], male    DOB: [birth year], 79 y.o.   MRN: [id number]  HPI Here today for CPE.  Risk Factors: HTN- chronic problem, BP mildly elevated today.  On Cozaar daily.  Denies CP, SOB, HAs, visual changes, edema.  Pt reports BP is 140-150s at home. Hyperlipidemia- chronic problem, on Lipitor.  Denies abd pain, N/V, myalgias. Parkinson's- Following at [facility].  Recent notes indicate sxs are worsening- increased fatigue Physical Activity: limited due to Parkinson's. Fall Risk: increased due to Parkinson's Depression: no current sxs Hearing: decreased to conversational tones and whispered voice ADL's: independent Cognitive:  Normal linear thought process, memory mildly decreased, attention intact. Home Safety: feels safe at home, lives w/ wife Height, Weight, BMI, Visual Acuity: see vitals, vision corrected to 20/20 w/ glasses Counseling: due for colonoscopy (2013 got call back)- not interested in repeating at this time, follows w/ urology Vernie Ammons) Labs Ordered: See A&P Care Plan: See A&P    Review of Systems Patient reports no vision/hearing changes, anorexia, fever ,adenopathy, persistant/recurrent hoarseness, swallowing issues, chest pain, palpitations, edema, persistant/recurrent cough, hemoptysis, dyspnea (rest,exertional, paroxysmal nocturnal), gastrointestinal  bleeding (melena, rectal bleeding), abdominal pain, excessive heart burn, syncope, focal weakness, memory loss, numbness & tingling, skin/hair/nail changes, depression, anxiety, abnormal bruising/bleeding, musculoskeletal symptoms/signs.  + OAB- was following w/ uro, no longer due to cost of meds    Objective:   Physical Exam BP 146/84  Pulse 100  Temp(Src) 98 F (36.7 C) (Oral)  Resp 16  Ht 5\' 10"  (1.778 m)  Wt 194 lb (87.998 kg)  BMI 27.84 kg/m2  SpO2 96%  General Appearance:    Alert, cooperative, no distress, appears stated age  Head:    Normocephalic, without obvious  abnormality, atraumatic  Eyes:    PERRL, conjunctiva/corneas clear, EOM's intact, fundi    benign, both eyes       Ears:    Normal TM's and external ear canals, both ears  Nose:   Nares normal, septum midline, mucosa normal, no drainage   or sinus tenderness  Throat:   Lips, mucosa, and tongue normal; teeth and gums normal  Neck:   Supple, symmetrical, trachea midline, no adenopathy;       thyroid:  No enlargement/tenderness/nodules  Back:     Symmetric, no curvature, ROM normal, no CVA tenderness  Lungs:     Clear to auscultation bilaterally, respirations unlabored  Chest wall:    No tenderness or deformity  Heart:    Regular rate and rhythm, S1 and S2 normal, no murmur, rub   or gallop  Abdomen:     Soft, non-tender, bowel sounds active all four quadrants,    no masses, no organomegaly  Genitalia:    Deferred at pt's request  Rectal:    Extremities:   Extremities normal, atraumatic, no cyanosis or edema  Pulses:   2+ and symmetric all extremities  Skin:   Skin color, texture, turgor normal, no rashes or lesions  Lymph nodes:   Cervical, supraclavicular, and axillary nodes normal  Neurologic:   L sided tremor, stooped, shuffling gait          Assessment & Plan:

## 2013-04-17 NOTE — Patient Instructions (Signed)
Follow up in 4-6 weeks to recheck BP Increase the Losartan to 100mg  (2 of the current tabs and 1 of the new script) We'll notify you of your lab results and make any changes if needed Complete the iFOB as directed Call with any questions or concerns Happy New Year!

## 2013-04-18 LAB — CBC WITH DIFFERENTIAL/PLATELET
Basophils Absolute: 0 10*3/uL (ref 0.0–0.1)
Basophils Relative: 0.6 % (ref 0.0–3.0)
Eosinophils Relative: 5.7 % — ABNORMAL HIGH (ref 0.0–5.0)
HCT: 43.3 % (ref 39.0–52.0)
Hemoglobin: 14.4 g/dL (ref 13.0–17.0)
Lymphs Abs: 1.7 10*3/uL (ref 0.7–4.0)
MCV: 93.3 fl (ref 78.0–100.0)
Monocytes Absolute: 0.7 10*3/uL (ref 0.1–1.0)
Monocytes Relative: 9.7 % (ref 3.0–12.0)
Neutro Abs: 4 10*3/uL (ref 1.4–7.7)
Platelets: 222 10*3/uL (ref 150.0–400.0)
RBC: 4.65 Mil/uL (ref 4.22–5.81)
RDW: 14.6 % (ref 11.5–14.6)
WBC: 6.8 10*3/uL (ref 4.5–10.5)

## 2013-04-18 LAB — TSH: TSH: 0.78 u[IU]/mL (ref 0.35–5.50)

## 2013-04-18 LAB — BASIC METABOLIC PANEL
BUN: 25 mg/dL — ABNORMAL HIGH (ref 6–23)
CO2: 25 mEq/L (ref 19–32)
Calcium: 9.2 mg/dL (ref 8.4–10.5)
Creatinine, Ser: 0.9 mg/dL (ref 0.4–1.5)
Glucose, Bld: 92 mg/dL (ref 70–99)
Potassium: 4 mEq/L (ref 3.5–5.1)
Sodium: 140 mEq/L (ref 135–145)

## 2013-04-18 LAB — HEPATIC FUNCTION PANEL
AST: 13 U/L (ref 0–37)
Albumin: 4.2 g/dL (ref 3.5–5.2)
Alkaline Phosphatase: 85 U/L (ref 39–117)
Total Bilirubin: 1.8 mg/dL — ABNORMAL HIGH (ref 0.3–1.2)
Total Protein: 6.2 g/dL (ref 6.0–8.3)

## 2013-04-18 LAB — LIPID PANEL: Total CHOL/HDL Ratio: 3

## 2013-04-18 LAB — PSA: PSA: 4.33 ng/mL — ABNORMAL HIGH (ref 0.10–4.00)

## 2013-04-19 ENCOUNTER — Other Ambulatory Visit: Payer: Self-pay | Admitting: Family Medicine

## 2013-04-19 DIAGNOSIS — R972 Elevated prostate specific antigen [PSA]: Secondary | ICD-10-CM

## 2013-04-20 NOTE — Assessment & Plan Note (Signed)
[name] PE WNL w/ exception of known parkinson's abnormalities.  Due for colonoscopy but pt not interested at this time.  Check labs.  Anticipatory guidance provided.

## 2013-04-20 NOTE — Assessment & Plan Note (Signed)
Hx of this, check labs today- refer back to urology if again elevated.  Pt expressed understanding and is in agreement w/ plan.

## 2013-04-20 NOTE — Assessment & Plan Note (Signed)
Chronic problem.  Following w/ Dr Linus Mako at [place] are worsening- now in discussion for possible deep brain stimulation.  Is considering switching to Dr Tat due to distance to [place].

## 2013-04-20 NOTE — Assessment & Plan Note (Signed)
Chronic problem.  Tolerating statin w/out difficulty.  Check labs.  Adjust meds prn  

## 2013-04-20 NOTE — Assessment & Plan Note (Signed)
Chronic problem.  Adequate control.  Asymptomatic.  Check labs.  No anticipated med changes 

## 2013-04-23 ENCOUNTER — Other Ambulatory Visit: Payer: Self-pay | Admitting: Family Medicine

## 2013-05-22 ENCOUNTER — Ambulatory Visit (INDEPENDENT_AMBULATORY_CARE_PROVIDER_SITE_OTHER): Payer: Medicare Other | Admitting: Family Medicine

## 2013-05-22 ENCOUNTER — Encounter: Payer: Self-pay | Admitting: Family Medicine

## 2013-05-22 VITALS — BP 138/84 | HR 88 | Temp 98.4°F | Resp 16 | Wt 192.4 lb

## 2013-05-22 DIAGNOSIS — I10 Essential (primary) hypertension: Secondary | ICD-10-CM

## 2013-05-22 LAB — BASIC METABOLIC PANEL
BUN: 23 mg/dL (ref 6–23)
CO2: 29 meq/L (ref 19–32)
Calcium: 9.3 mg/dL (ref 8.4–10.5)
Chloride: 104 mEq/L (ref 96–112)
Creatinine, Ser: 1 mg/dL (ref 0.4–1.5)
GFR: 79.74 mL/min (ref >60.00)
Glucose, Bld: 82 mg/dL (ref 70–99)
Potassium: 3.8 mEq/L (ref 3.5–5.1)
SODIUM: 140 meq/L (ref 135–145)

## 2013-05-22 NOTE — Progress Notes (Signed)
Pre visit review using our clinic review tool, if applicable. No additional management support is needed unless otherwise documented below in the visit note. 

## 2013-05-22 NOTE — Assessment & Plan Note (Signed)
Chronic problem.  There was some confusion on pt's end when we adjusted his meds last visit.  Clarified the regimen of Losartan 100mg  dailiy.  Will check BMP.  Pt to monitor BPs and notify me if consistently >140/90.  Will follow.

## 2013-05-22 NOTE — Progress Notes (Signed)
   Subjective:    Patient ID: [name], male    DOB: [birth year], 80 y.o.   MRN: [id number]  HPI HTN- chronic problem, at last visit pt was supposed to increase Losartan to 100mg  (2 of original 50mg  pills and then 1 of the new 100mg  pills).  Pt was taking 2 tabs of the 100mg  for a total of 200mg .  No CP, SOB, HAs, visual changes, edema.  Due for BMP today.   Review of Systems For ROS see HPI     Objective:   Physical Exam  Vitals reviewed. Constitutional: He is oriented to person, place, and time. He appears well-developed and well-nourished. No distress.  HENT:  Head: Normocephalic and atraumatic.  Eyes: Conjunctivae and EOM are normal. Pupils are equal, round, and reactive to light.  Neck: Normal range of motion. Neck supple. No thyromegaly present.  Cardiovascular: Normal rate, regular rhythm, normal heart sounds and intact distal pulses.   No murmur heard. Pulmonary/Chest: Effort normal and breath sounds normal. No respiratory distress.  Abdominal: Soft. Bowel sounds are normal. He exhibits no distension.  Musculoskeletal: He exhibits no edema.  Lymphadenopathy:    He has no cervical adenopathy.  Neurological: He is alert and oriented to person, place, and time. No cranial nerve deficit.  Skin: Skin is warm and dry.  Psychiatric: He has a normal mood and affect. His behavior is normal.          Assessment & Plan:

## 2013-05-22 NOTE — Patient Instructions (Signed)
Follow up in June to recheck BP and cholesterol We'll notify you of your lab results and make any changes if needed Continue the Losartan 1 tab daily Monitor your BP- if consistently higher than 140/90, please call me Call with any questions or concerns Happy Valentine's Day!

## 2013-05-23 ENCOUNTER — Telehealth: Payer: Self-pay | Admitting: Family Medicine

## 2013-05-23 NOTE — Telephone Encounter (Signed)
Relevant patient education assigned to patient using Emmi. ° °

## 2013-05-24 ENCOUNTER — Other Ambulatory Visit: Payer: Self-pay | Admitting: Family Medicine

## 2013-05-24 NOTE — Telephone Encounter (Signed)
Med filled.  

## 2013-07-27 ENCOUNTER — Other Ambulatory Visit: Payer: Self-pay | Admitting: Family Medicine

## 2013-07-27 NOTE — Telephone Encounter (Signed)
Med filled.  

## 2013-09-20 ENCOUNTER — Encounter: Payer: Self-pay | Admitting: Family Medicine

## 2013-09-20 ENCOUNTER — Other Ambulatory Visit: Payer: Self-pay | Admitting: Family Medicine

## 2013-09-20 ENCOUNTER — Ambulatory Visit (INDEPENDENT_AMBULATORY_CARE_PROVIDER_SITE_OTHER): Payer: Medicare Other | Admitting: Family Medicine

## 2013-09-20 VITALS — BP 128/72 | HR 89 | Temp 98.0°F | Resp 16 | Wt 194.1 lb

## 2013-09-20 DIAGNOSIS — E785 Hyperlipidemia, unspecified: Secondary | ICD-10-CM

## 2013-09-20 DIAGNOSIS — F329 Major depressive disorder, single episode, unspecified: Secondary | ICD-10-CM

## 2013-09-20 DIAGNOSIS — R17 Unspecified jaundice: Secondary | ICD-10-CM

## 2013-09-20 DIAGNOSIS — G20 Parkinson's disease: Secondary | ICD-10-CM

## 2013-09-20 DIAGNOSIS — I10 Essential (primary) hypertension: Secondary | ICD-10-CM

## 2013-09-20 DIAGNOSIS — R972 Elevated prostate specific antigen [PSA]: Secondary | ICD-10-CM

## 2013-09-20 DIAGNOSIS — F3289 Other specified depressive episodes: Secondary | ICD-10-CM

## 2013-09-20 LAB — BASIC METABOLIC PANEL
BUN: 27 mg/dL — AB (ref 6–23)
CO2: 25 meq/L (ref 19–32)
Calcium: 9.3 mg/dL (ref 8.4–10.5)
Chloride: 107 mEq/L (ref 96–112)
Creatinine, Ser: 0.9 mg/dL (ref 0.4–1.5)
GFR: 84.69 mL/min (ref >60.00)
GLUCOSE: 96 mg/dL (ref 70–99)
Potassium: 3.9 mEq/L (ref 3.5–5.1)
SODIUM: 140 meq/L (ref 135–145)

## 2013-09-20 LAB — HEPATIC FUNCTION PANEL
ALBUMIN: 4.3 g/dL (ref 3.5–5.2)
ALT: 6 U/L (ref 0–53)
AST: 15 U/L (ref 0–37)
Alkaline Phosphatase: 103 U/L (ref 39–117)
Bilirubin, Direct: 0.2 mg/dL (ref 0.0–0.3)
Total Bilirubin: 3 mg/dL — ABNORMAL HIGH (ref 0.2–1.2)
Total Protein: 6.8 g/dL (ref 6.0–8.3)

## 2013-09-20 LAB — LIPID PANEL
CHOL/HDL RATIO: 3
Cholesterol: 130 mg/dL (ref 0–200)
HDL: 48.3 mg/dL (ref >39.00)
LDL CALC: 71 mg/dL (ref 0–99)
NonHDL: 81.7
Triglycerides: 56 mg/dL (ref 0.0–149.0)
VLDL: 11.2 mg/dL (ref 0.0–40.0)

## 2013-09-20 NOTE — Assessment & Plan Note (Signed)
Refer for 2nd opinion at urology as pt was unhappy w/ last encounter.

## 2013-09-20 NOTE — Progress Notes (Signed)
Pre visit review using our clinic review tool, if applicable. No additional management support is needed unless otherwise documented below in the visit note. 

## 2013-09-20 NOTE — Patient Instructions (Signed)
Schedule your complete physical in 6 months We'll notify you of your lab results and make any changes if needed Please consider starting counseling to address the worsening depression Call with any questions or concerns Hang in there!!!

## 2013-09-20 NOTE — Progress Notes (Signed)
   Subjective:    Patient ID: [name], male    DOB: 12-Dec-1933, 80 y.o.   MRN: [id number]  HPI HTN- chronic problem, on Losartan.  BP is excellent today.  Denies CP, SOB, HAs, visual changes, edema  Hyperlipidemia- chronic problem, on Lipitor.  Denies abd pain, N/V, myalgias.  Depression- pt is on Lexapro but admits that his current situation is 'bringing me down'.  'i take 22 pills a day and i'm not getting anywhere'.  Parkinson's is now causing pt to slur words, have difficulty w/ memory.  'there are some days I don't want to get up in the morning'.    Parkinson's- would like to see Dr Tat  Elevated PSA- pt reports he was unhappy w/ experience at [facility].  Would like a 2nd opinion.   Review of Systems For ROS see HPI     Objective:   Physical Exam  Vitals reviewed. Constitutional: He is oriented to person, place, and time. He appears well-developed and well-nourished. No distress.  HENT:  Head: Normocephalic and atraumatic.  Eyes: Conjunctivae and EOM are normal. Pupils are equal, round, and reactive to light.  Neck: Normal range of motion. Neck supple. No thyromegaly present.  Cardiovascular: Normal rate, regular rhythm, normal heart sounds and intact distal pulses.   No murmur heard. Pulmonary/Chest: Effort normal and breath sounds normal. No respiratory distress.  Abdominal: Soft. Bowel sounds are normal. He exhibits no distension.  Musculoskeletal: He exhibits no edema.  Lymphadenopathy:    He has no cervical adenopathy.  Neurological: He is alert and oriented to person, place, and time. No cranial nerve deficit.  Skin: Skin is warm and dry.  Psychiatric: His behavior is normal.  Flat affect, depressed          Assessment & Plan:

## 2013-09-20 NOTE — Assessment & Plan Note (Signed)
Chronic problem, tolerating statin w/o difficulty.  Check labs.  Adjust meds prn  

## 2013-09-20 NOTE — Assessment & Plan Note (Signed)
Chronic problem.  Well controlled.  Asymptomatic.  Check labs.  No anticipated med changes. 

## 2013-09-20 NOTE — Assessment & Plan Note (Signed)
Deteriorated.  Pt is much more depressed today as he is finding things are more and more difficult to do the way he used to.  Having a hard time reconciling his loss of independence.  Recommended he start counseling.  Already on SSRI.  Will continue to follow.

## 2013-09-20 NOTE — Assessment & Plan Note (Signed)
Chronic problem.  Refer to Dr Tat at pt's request.

## 2013-09-21 ENCOUNTER — Encounter: Payer: Self-pay | Admitting: Neurology

## 2013-09-22 ENCOUNTER — Encounter: Payer: Self-pay | Admitting: Neurology

## 2013-09-22 ENCOUNTER — Ambulatory Visit (INDEPENDENT_AMBULATORY_CARE_PROVIDER_SITE_OTHER): Payer: Medicare Other | Admitting: Neurology

## 2013-09-22 ENCOUNTER — Other Ambulatory Visit (HOSPITAL_COMMUNITY): Payer: Self-pay | Admitting: Neurology

## 2013-09-22 VITALS — BP 138/66 | HR 88 | Resp 16 | Ht 70.5 in | Wt 194.0 lb

## 2013-09-22 DIAGNOSIS — F32A Depression, unspecified: Secondary | ICD-10-CM

## 2013-09-22 DIAGNOSIS — R1310 Dysphagia, unspecified: Secondary | ICD-10-CM

## 2013-09-22 DIAGNOSIS — G20 Parkinson's disease: Secondary | ICD-10-CM

## 2013-09-22 DIAGNOSIS — G3184 Mild cognitive impairment, so stated: Secondary | ICD-10-CM

## 2013-09-22 DIAGNOSIS — F329 Major depressive disorder, single episode, unspecified: Secondary | ICD-10-CM

## 2013-09-22 DIAGNOSIS — F3289 Other specified depressive episodes: Secondary | ICD-10-CM

## 2013-09-22 MED ORDER — CARBIDOPA-LEVODOPA 25-100 MG PO TABS: 2.0000 | ORAL_TABLET | Freq: Four times a day (QID) | ORAL | Status: DC

## 2013-09-22 NOTE — Progress Notes (Signed)
[name] was seen today in the movement disorders clinic for neurologic consultation at the request of Annye Asa, MD.  The consultation is for the evaluation of PD.  The pt is accompanied by his wife who supplements the hx.  The patient was previously seen by [name] and I did have the opportunity to review those records.  Patient is wishing to transfer care because of the length of travel to [facility].  The first symptom(s) the patient noticed was unilateral hand tremor of the L hand and this was approximately 2005.  Pt is L hand dominant.   Pt went to his PCP and he was referred to [facility] and was dx with PD.  I do not have those records. Pt believes that he was started on requip but doesn't think that it helped.  He thinks that he was on it for about a year and his wife believes that it caused sleep attacks.  He is not sure what the next medication was but the physician at [facility] left and he was then referred to [name] and he has been seeing him and his NP since.  The patient is currently on carbidopa/levodopa 50/200 CR 1-1/2 tablets at 8 AM/ and then one at noon/4pm/8pm along with a carbidopa/levodopa 25/100 in the afternoon and in the evening.  He admits that he often misses dosages.  He is on amantadine 100 mg 4 times a day and selegiline 5 mg twice a day.  Specific Symptoms:  Tremor: yes ("very occasional" and only if "strained"; always in the L) Voice: hypophonic Sleep: trouble getting and staying asleep but seems related to prostate issues  Vivid Dreams:  yes  Acting out dreams:  yes Wet Pillows: yes Postural symptoms:  yes  Falls?  yes (last fall about 3 months ago - fell on carpet) Bradykinesia symptoms: difficulty getting out of a chair and difficulty regaining balance Loss of smell:  no Loss of taste:  yes Urinary Incontinence:  yes (wears diapers at night) Difficulty Swallowing:  yes (last mbe 2-3 years ago) Handwriting, micrographia:  yes Trouble with ADL's:  yes (some trouble putting pants on standing up - had fall doing so)  Trouble buttoning clothing: yes Depression:  yes (wife works outside of the home and pt gets bored and he doesn't enjoy driving to leave home) Memory changes:  yes (some short term issues per wife) Hallucinations:  no  visual distortions: yes N/V:  no Lightheaded:  yes  Syncope: no Diplopia:  yes (vertical and diplopia x 5 years.  Better if adjusts head/glasses; improved after cataract surgery) Dyskinesia:  yes   PREVIOUS MEDICATIONS: Sinemet, Sinemet CR, Requip and Eldpryl Requip (sleep attacks)  ALLERGIES:  No Known Allergies  CURRENT MEDICATIONS:  Current Outpatient Prescriptions on File Prior to Visit  Medication Sig Dispense Refill  . amantadine (SYMMETREL) 100 MG capsule Take 100 mg by mouth 4 (four) times daily.       Marland Kitchen aspirin 81 MG tablet Take 162 mg by mouth daily.       Marland Kitchen atorvastatin (LIPITOR) 40 MG tablet Take one tablet by mouth one time daily  30 tablet  2  . carbidopa-levodopa (SINEMET CR) 50-200 MG per tablet Take 1 tablet by mouth 2 (two) times daily. 1 1/2 tablets in the am, and three tablets throughout remainder of the day.      . carbidopa-levodopa (SINEMET) 25-100 MG per tablet Take 1 tablet by mouth 2 (two) times daily. Middle of  day and later in afternoon      . celecoxib (CELEBREX) 200 MG capsule TAKE ONE CAPSULE BY MOUTH ONE TIME DAILY   30 capsule  5  . escitalopram (LEXAPRO) 10 MG tablet Take 10 mg by mouth every evening.      . fexofenadine (ALLEGRA) 180 MG tablet Take 180 mg by mouth daily.       . fluocinonide cream (LIDEX) 0.08 % Apply 1 application topically as needed.      . fluticasone (FLONASE) 50 MCG/ACT nasal spray Use two sprays in each nostril daily  16 g  4  . ibuprofen (ADVIL,MOTRIN) 200 MG tablet Take 200 mg by mouth every 6 (six) hours as needed.      Marland Kitchen ketoconazole (NIZORAL) 2 % cream Apply 1 application topically daily. Use on forehead      .  losartan (COZAAR) 100 MG tablet Take 1 tablet (100 mg total) by mouth daily.  90 tablet  3  . oxyCODONE-acetaminophen (PERCOCET) 5-325 MG per tablet Take 1 tablet by mouth every 4 (four) hours as needed. For back pain      . polyethylene glycol (MIRALAX / GLYCOLAX) packet Take 17 g by mouth daily as needed. For laxative      . Probiotic Product (PROBIOTIC DAILY PO) Take 1 capsule by mouth daily.      . selegiline (ELDEPRYL) 5 MG capsule Take 5 mg by mouth 2 (two) times daily.        No current facility-administered medications on file prior to visit.    PAST MEDICAL HISTORY:   Past Medical History  Diagnosis Date  . Hyperlipidemia   . Hypertension   . Osteoarthritis   . Parkinson disease   . Erectile dysfunction   . GERD (gastroesophageal reflux disease)     PAST SURGICAL HISTORY:   Past Surgical History  Procedure Laterality Date  . Right arm fracture:  orif    . Cystectomy      left forehead  . Nasal sinus surgery  1982  . External ear surgery  10/2011    left ear  . Inguinal hernia repair  03/09/2012    Procedure: HERNIA REPAIR INGUINAL ADULT;  Surgeon: Adin Hector, MD;  Location: [facility];  Service: General;  Laterality: Right;  . Insertion of mesh  03/09/2012    Procedure: INSERTION OF MESH;  Surgeon: Adin Hector, MD;  Location: [facility];  Service: General;  Laterality: Right;  . Hernia repair      SOCIAL HISTORY:   History   Social History  . Marital Status: Married    Spouse Name: N/A    Number of Children: N/A  . Years of Education: N/A   Occupational History  . Not on file.   Social History Main Topics  . Smoking status: Former Smoker    Quit date: 04/20/1969  . Smokeless tobacco: Never Used  . Alcohol Use: Yes     Comment: 2-3x per week; wine  . Drug Use: No  . Sexual Activity: Not on file   Other Topics Concern  . Not on file   Social History Narrative  . No narrative on file    FAMILY HISTORY:   Family Status  Relation Status Death  Age  . Mother Deceased   . Father Deceased     ROS:  A complete 10 system review of systems was obtained and was unremarkable apart from what is mentioned above.  PHYSICAL EXAMINATION:    VITALS:   Filed  Vitals:   09/22/13 1330  BP: 138/66  Pulse: 88  Resp: 16  Height: 5' 10.5" (1.791 m)  Weight: 194 lb (87.998 kg)    GEN:  The patient appears stated age and is in NAD. HEENT:  Normocephalic, atraumatic.  The mucous membranes are moist. The superficial temporal arteries are without ropiness or tenderness. CV:  RRR Lungs:  CTAB Neck/HEME:  There are no carotid bruits bilaterally.  Neurological examination:  Orientation: The patient is alert and oriented x3. Fund of knowledge is appropriate.  Recent and remote memory are intact.  Attention and concentration are normal.    Able to name objects and repeat phrases. Cranial nerves: There is good facial symmetry.  There is mild facial hypomimia.  Pupils are equal round and reactive to light bilaterally. Fundoscopic exam reveals clear margins bilaterally.  There are no square wave jerks. Extraocular muscles are intact. The visual fields are full to confrontational testing. The speech is fluent and clear.  The patient has some difficulty with the guttural sounds.  He is mildly hypophonic.  Soft palate rises symmetrically and there is no tongue deviation. Hearing is intact to conversational tone. Sensation: Sensation is intact to light and pinprick throughout (facial, trunk, extremities). Vibration is intact at the bilateral big toe. There is no extinction with double simultaneous stimulation. There is no sensory dermatomal level identified. Motor: Strength is 5/5 in the bilateral upper and lower extremities.   Shoulder shrug is equal and symmetric.  There is no pronator drift. Deep tendon reflexes: Deep tendon reflexes are 2/4 at the bilateral biceps, triceps, brachioradialis, 2- at the bilateral patella and absent at the bilateral achilles.  Plantar responses are downgoing bilaterally.  Movement examination: Tone: There is normal tone in the bilateral upper extremities.  The tone in the lower extremities is normal.  Abnormal movements: There is minor dyskinesia in the toes bilaterally, left greater than right Coordination:  There is slight decremation with RAM's, seen bilaterally. Gait and Station: The patient has no significant difficulty arising out of a deep-seated chair without the use of the hands. The patient's stride length is slightly decreased.  The patient has a positive pull test.      ASSESSMENT/PLAN:  1.  Idiopathic Parkinson's disease  -I had a long discussion with the patient and his wife today.  Greater than 50% of the 80 minute visit was spent in counseling.  We discussed safety, pathophysiology, prognosis.  We discussed extensively the importance of exercise, specifically cardiovascular exercise done safely.  I would like him to begin this.  We talked about the new exercise program at the [facility] for patients with Parkinson's disease.  -The patient will be referred to the neuro rehabilitation Center for the Parkinson's program.  -The patient and I talked about the importance of compliance with medication.  We talked about his current medication regimen.  It has been somewhat difficult to keep up with.  Overall, he is on a total daily levodopa dosage of about 1100 mg of levodopa a day, but he misses dosages throughout the day.  In addition, this is a combination of CR and IR throughout the day, and he is splitting some of the CR dosages, which does not make sense to me but I told him I cannot argue with the fact that he actually looks very good today.  In the end and after a long discussion we ultimately decided to try and change him to carbidopa/levodopa 25/100 IR, 2 tablets at 8 AM/noon/4 PM/8  PM and only use the 50/200 at bedtime.  He will let me know if he does not do well with this.  -I. think he is getting some  insomnia at night from his selegiline, because he is taking erratically and sometimes ends up taking it close to bedtime.  He and I talked about the fact that selegiline breaks down into amphetamines and he really should not take the last dose after noon.  I told him to take it at 8 AM and noon.  -The patient asked about DBS.  He really is not interested in it, and I do not think he needs at this point in time.  He is not experiencing motor fluctuations 2.  Dysphagia.  -We will refer him for a modified barium swallow. 3.  Depression.  -He is on Lexapro.  I think that cardiovascular exercise would greatly help.   4.  mild cognitive impairment.  -This is likely related to the Parkinson's, but I also think that depression contributes to memory change.  I do not see dementia at this point. 5.  I will plan on seeing him back in the next few months, sooner should new neurologic issues arise.

## 2013-09-22 NOTE — Patient Instructions (Addendum)
1. Change Medications as follows:  Carbidopa Levodopa 25/100 IR take 2 tablets at 8:00 am/ 2 tablets at 12:00 pm/ 2 tablets at 4:00 pm/ 2 tablets at 8:00 pm. Carbidopa Levodopa 50/200 CR take 1 tablet at bedtime (10:30-11:00 pm). Selegiline take 1 tablet at 8:00 am and 1 tablet at noon.   2. Let us know next week if you don't feel like the Levodopa is working. A new prescription has been sent to your pharmacy.  3. You have been referred to [facility]. They will call you directly to schedule an appointment.  Please call [phone] if you do not hear from them.   4. We have you scheduled for a modified barium swallow on 09/29/13 at 11:30 am. Please arrive at 11:15 am and go to 1st floor radiology at [facility]. If you need to reschedule for any reason please call [phone].  5. Follow up in 3 months.

## 2013-09-29 ENCOUNTER — Ambulatory Visit (HOSPITAL_COMMUNITY)
Admission: RE | Admit: 2013-09-29 | Discharge: 2013-09-29 | Disposition: A | Discharge status: Home / Self Care | Payer: Medicare Other | Source: Ambulatory Visit | Attending: Neurology | Admitting: Neurology

## 2013-09-29 DIAGNOSIS — R1310 Dysphagia, unspecified: Secondary | ICD-10-CM | POA: Insufficient documentation

## 2013-09-29 NOTE — Procedures (Signed)
Objective Swallowing Evaluation: Modified Barium Swallowing Study  Patient Details  Name: [name] MRN: [id number] Date of Birth: 03/24/1934  Today's Date: 09/29/2013 Time: 1140-1205 SLP Time Calculation (min): 25 min  Past Medical History:  Past Medical History  Diagnosis Date  . Hyperlipidemia   . Hypertension   . Osteoarthritis   . Parkinson disease   . Erectile dysfunction   . GERD (gastroesophageal reflux disease)    Past Surgical History:  Past Surgical History  Procedure Laterality Date  . Right arm fracture:  orif    . Cystectomy      left forehead  . Nasal sinus surgery  1982  . External ear surgery  10/2011    left ear  . Inguinal hernia repair  03/09/2012    Procedure: HERNIA REPAIR INGUINAL ADULT;  Surgeon: Adin Hector, MD;  Location: [facility];  Service: General;  Laterality: Right;  . Insertion of mesh  03/09/2012    Procedure: INSERTION OF MESH;  Surgeon: Adin Hector, MD;  Location: [facility];  Service: General;  Laterality: Right;   HPI:  80 y.o. male with idiopathic Parkinson's Disease, depression, mild cognitive deficit, dysphagia referred for OPMBS.  Pt reports chonic, mild issues with swallowing including intermittent cough, c/o food "lodging" in throat.       Assessment / Plan / Recommendation Clinical Impression  Dysphagia Diagnosis: Within Functional Limits Clinical impression: Pt presents with normal oropharyngeal swallow function marked by adequate timing of swallow; penetration x 1 with thin liquids, not considered impaired in normal aging population.  Oral manipulation is functional. Reviewed results in real time; pt observed video as study progressed.  At this time, pt presents with no Parkinsonian-based swallowing deficits.  Pt verbalized feeling encouraged by results.  No further w/u recommended at this time.      Treatment Recommendation  No treatment recommended at this time    Diet Recommendation Regular;Thin liquid   Liquid  Administration via: Cup;Straw Medication Administration: Whole meds with liquid Supervision: Patient able to self feed    Other  Recommendations Oral Care Recommendations: Oral care BID   Follow Up Recommendations  None    Frequency and Duration            SLP Swallow Goals     General HPI: 80 y.o. male with idiopathic Parkinson's Disease, depression, mild cognitive deficit, dysphagia referred for OPMBS.  Pt reports chonic, mild issues with swallowing including intermittent cough, c/o food "lodging" in throat.   Type of Study: Modified Barium Swallowing Study Reason for Referral: Objectively evaluate swallowing function Previous Swallow Assessment: [facility] several years ago per pt - no records Diet Prior to this Study: Regular;Thin liquids Temperature Spikes Noted: No Respiratory Status: Room air History of Recent Intubation: No Behavior/Cognition: Alert;Cooperative;Pleasant mood Oral Cavity - Dentition: Adequate natural dentition Oral Motor / Sensory Function: Within functional limits (intact CN function) Self-Feeding Abilities: Able to feed self Patient Positioning: Upright in chair Baseline Vocal Quality: Clear;Low vocal intensity Volitional Cough: Strong Volitional Swallow: Able to elicit Anatomy: Other (Comment) (appearance of osteophyte C4-5 - no MD to confirm) Pharyngeal Secretions: Not observed secondary MBS    Reason for Referral Objectively evaluate swallowing function   Oral Phase Oral Preparation/Oral Phase Oral Phase: WFL   Pharyngeal Phase Pharyngeal Phase Pharyngeal Phase: Within functional limits  Cervical Esophageal Phase    GO    Cervical Esophageal Phase Cervical Esophageal Phase: [facility]    Functional Assessment Tool Used: clinical judgement Functional Limitations: Swallowing Swallow  Current Status [phone]): 0 percent impaired, limited or restricted Swallow Goal Status ([id number]): 0 percent impaired, limited or restricted Swallow  Discharge Status [phone]): 0 percent impaired, limited or restricted    [name] 09/29/2013, 12:17 PM

## 2013-10-10 ENCOUNTER — Telehealth: Payer: Self-pay | Admitting: Family Medicine

## 2013-10-10 NOTE — Telephone Encounter (Signed)
Spoke with pt and advised that [employer] are actually counselors and that he is able to call, make an appt with them and he will be seen in our office.

## 2013-10-10 NOTE — Telephone Encounter (Signed)
Caller name: Gustaf Mccarter Relation to pt: patient Call back number: [phone] Pharmacy:  Reason for call: patient would like to discuss with Dr Birdie Riddle different counseling place she picked out for him at his last office visit.  Please advise.

## 2013-10-16 ENCOUNTER — Ambulatory Visit (INDEPENDENT_AMBULATORY_CARE_PROVIDER_SITE_OTHER): Payer: No Typology Code available for payment source | Admitting: Psychology

## 2013-10-16 DIAGNOSIS — F331 Major depressive disorder, recurrent, moderate: Secondary | ICD-10-CM

## 2013-10-23 ENCOUNTER — Ambulatory Visit: Payer: Medicare Other | Admitting: Psychology

## 2013-11-03 ENCOUNTER — Ambulatory Visit (INDEPENDENT_AMBULATORY_CARE_PROVIDER_SITE_OTHER): Payer: No Typology Code available for payment source | Admitting: Psychology

## 2013-11-03 ENCOUNTER — Other Ambulatory Visit: Payer: Self-pay | Admitting: Family Medicine

## 2013-11-03 DIAGNOSIS — F331 Major depressive disorder, recurrent, moderate: Secondary | ICD-10-CM

## 2013-11-03 NOTE — Telephone Encounter (Signed)
Med filled.  

## 2013-11-06 ENCOUNTER — Other Ambulatory Visit: Payer: Self-pay | Admitting: Neurology

## 2013-11-06 MED ORDER — CARBIDOPA-LEVODOPA ER 50-200 MG PO TBCR: 1.0000 | EXTENDED_RELEASE_TABLET | Freq: Every day | ORAL | Status: DC

## 2013-11-20 ENCOUNTER — Ambulatory Visit (INDEPENDENT_AMBULATORY_CARE_PROVIDER_SITE_OTHER): Payer: No Typology Code available for payment source | Admitting: Psychology

## 2013-11-20 DIAGNOSIS — F331 Major depressive disorder, recurrent, moderate: Secondary | ICD-10-CM

## 2013-11-22 ENCOUNTER — Other Ambulatory Visit: Payer: Self-pay | Admitting: Urology

## 2013-12-04 ENCOUNTER — Other Ambulatory Visit: Payer: Self-pay | Admitting: Family Medicine

## 2013-12-04 NOTE — Telephone Encounter (Signed)
Med filled.  

## 2013-12-06 ENCOUNTER — Ambulatory Visit (INDEPENDENT_AMBULATORY_CARE_PROVIDER_SITE_OTHER): Payer: No Typology Code available for payment source | Admitting: Psychology

## 2013-12-06 DIAGNOSIS — F331 Major depressive disorder, recurrent, moderate: Secondary | ICD-10-CM

## 2013-12-12 ENCOUNTER — Encounter (HOSPITAL_BASED_OUTPATIENT_CLINIC_OR_DEPARTMENT_OTHER): Payer: Self-pay | Admitting: *Deleted

## 2013-12-12 NOTE — Progress Notes (Signed)
NPO AFTER MN. ARRIVE AT 0600. NEEDS ISTAT AND EKG. WILL TAKE SINEMET, SYMMETREL, AND ELDEPRYL AM DOS W/ SIPS OF WATER.

## 2013-12-18 NOTE — H&P (Signed)
History of Present Illness        F/u -     1- elevated PSA: He had transient elevation in his PSA in 2/11 to 4.11.   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 (Max capacity was 322 cc, hypersensitivity noted and a first sensation occurring at 175 cc. Detrusor instability was identified with his first contraction occurring at 322 cc and a maximum unstable bladder contraction pressure of 58 cm H2O associated with leakage of the entire bladder volume. Voided 198 cc with a maximum flow rate of 7 cc/second and maximum detrusor pressure at maximum flow of 38 cm H2O.  NG risk include Parkinson's Tried - oxybutynin ER 15 mg and Vesicare 10 mg as well as Myrbetriq. He failed all of these. The Myrbetriq may have helped somewhat but caused an elevation of his blood pressure and made him feel poorly. PTNS was ordered.  Most bothersome is urgency urge incontinence and nocturia 2. Myrbetriq and antimuscarinics cause dry mouth. Occ weak Fromstream. -Jul 2015 tried tamsulosin  3- Organic erectile dysfunction: He was seen by Dr. Janice Norrie for this in 2000. Viagra was prescribed at that time.  4- microscopic hematuria - Jul 2015 - He's had this on and off for the past 2 years. He is a former smoker but quit in 1971. He's had no exposure to dye, solvents, chemotherapy or radiation. He's had no gross hematuria.   Description of air out of there.  Aug 2015 interval hx  He returns and evaluation of urinary symptoms and microscopic hematuria. He had a negative CT hematuria protocol. I reviewed all the images. He did have some BPH and a median lobe. We tried him on  tamsulosin. He didn't note any difference on tamsulosin. He has bothersome urgency with some urge urinary incontinence. He's had no gross hematuria or dysuria.   Past Medical History Problems  1. History of Anxiety (300.00) 2. History of Arthritis (V13.4) 3. History of esophageal reflux (V12.79) 4. History of hypertension (V12.59) 5. History of Parkinson's disease (332.0)  Surgical History Problems  1. History of Incision And Drainage Of Skin Abscess Forehead  Current Meds 1. Allegra 180 MG TABS;  Therapy: (Recorded:04Apr2013) to Recorded 2. Amantadine HCl - 100 MG Oral Capsule;  Therapy: 13Sep2012 to Recorded 3. Aspirin 81 MG Oral Tablet;  Therapy: (Recorded:04Apr2013) to Recorded 4. Atorvastatin Calcium 40 MG Oral Tablet;  Therapy: 11Oct2012 to Recorded 5. Carbidopa-Levodopa 25-100 MG Oral Tablet;  Therapy: 27Sep2012 to Recorded 6. CeleBREX 200 MG Oral Capsule;  Therapy: [id number] to Recorded 7. Fluocinonide 0.05 % External Cream;  Therapy: (Recorded:08Jan2015) to Recorded 8. Fluticasone Propionate 50 MCG/ACT Nasal Suspension;  Therapy: [id number] to Recorded 9. Ibuprofen TABS;  Therapy: (Recorded:08Jan2015) to Recorded 10. Ketoconazole 2 % External Cream;   Therapy: (Recorded:08Jan2015) to Recorded 11. Lexapro 10 MG Oral Tablet;   Therapy: (Recorded:08Jan2015) to Recorded 12. Losartan Potassium 100 MG Oral Tablet;   Therapy: [id number] to Recorded 13. Omeprazole 40 MG Oral Capsule Delayed Release;   Therapy: 11Oct2012 to Recorded 14. Oxycodone-Acetaminophen 5-325 MG Oral Tablet;   Therapy: (Recorded:08Jan2015) to Recorded 15. Polyethylene Glycol POWD;   Therapy: (  TDVVOHYW:[id number]) to Recorded 16. Probiotic CAPS;   Therapy: (Recorded:08Jan2015) to Recorded 17. Selegiline HCl - 5 MG Oral Capsule;   Therapy: [id number] to Recorded 18. Selegiline HCl - 5 MG Oral Tablet;   Therapy: (Recorded:08Jan2015) to Recorded 19. Tamsulosin HCl - 0.4 MG Oral Capsule; TAKE 1  CAPSULE Daily;   Therapy: [id number] to (Evaluate:07Jul2016); Last Rx:13Jul2015 Ordered  Allergies Medication  1. No Known Drug Allergies  Family History Problems  1. Family history of Alzheimer's Disease : Mother 2. Family history of Cancer : Father  Social History Problems  1. Alcohol Use   drinks one glass of wine most days 2. Caffeine Use   3 - 4 servings daily 3. Marital History - Currently Married 4. Never A Smoker 5. Occupation: Retired  Engineer, site Vital Signs [Data Includes: Last 1 Day]  Recorded: 04Aug2015 03:01PM  Blood Pressure: 137 / 74 Temperature: 97.6 F Heart Rate: 87  Results/Data Urine [Data Includes: Last 1 Day]   [id number]  COLOR YELLOW   APPEARANCE CLEAR   SPECIFIC GRAVITY >1.030   pH 5.5   GLUCOSE NEG mg/dL  BILIRUBIN SMALL   KETONE TRACE mg/dL  BLOOD NEG   PROTEIN NEG mg/dL  UROBILINOGEN 1 mg/dL  NITRITE NEG   LEUKOCYTE ESTERASE NEG    Procedure  Procedure: Cystoscopy   Indication: Hematuria.  Informed Consent: Risks, benefits, and potential adverse events were discussed and informed consent was obtained from the patient.  Prep: The patient was prepped with betadine.  Antibiotic prophylaxis: Ciprofloxacin.  Procedure Note:  Urethral meatus:. No abnormalities.  Anterior urethra: No abnormalities.  Prostatic urethra: No abnormalities.  Bladder: Visulization was clear. The ureteral orifices were in the normal anatomic position bilaterally and had clear efflux of urine. A systematic survey of the bladder demonstrated no bladder tumors or stones. The mucosa was smooth without abnormalities. 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 This tumor was located on the left side of the bladder. It was just above or behind the left ureteral orifice. The patient tolerated the procedure well.  Complications: None.    Plan Bladder neoplasm  1. Follow-up Schedule Surgery Office  Follow-up  Status: Hold For - Appointment   Requested for:  04Aug2015 Health Maintenance  2. UA With REFLEX; [Do Not Release]; Status:Complete;   Done: [id number] 02:45PM  Discussion/Summary       Bladder neoplasm-I discussed with the patient the cystoscopic findings in the nature risk benefits and alternatives to cystoscopy with bladder biopsy possible TURBT, mitomycin C installation, possible ureteral stent. We discussed on occasion procedures need to be staged. We discussed risks of bladder perforation and Foley use among others. All questions answered. He elects to proceed.    cc: Dr. Birdie Riddle     Signatures Electronically signed by : Festus Aloe, M.D.; Nov 21 2013  3:56PM EST

## 2013-12-19 ENCOUNTER — Encounter (HOSPITAL_BASED_OUTPATIENT_CLINIC_OR_DEPARTMENT_OTHER)
Admission: RE | Disposition: A | Discharge status: Home / Self Care | Payer: Self-pay | Source: Ambulatory Visit | Attending: Urology

## 2013-12-19 ENCOUNTER — Encounter (HOSPITAL_BASED_OUTPATIENT_CLINIC_OR_DEPARTMENT_OTHER): Payer: Medicare Other | Admitting: Anesthesiology

## 2013-12-19 ENCOUNTER — Ambulatory Visit (HOSPITAL_BASED_OUTPATIENT_CLINIC_OR_DEPARTMENT_OTHER): Payer: Medicare Other | Admitting: Anesthesiology

## 2013-12-19 ENCOUNTER — Encounter (HOSPITAL_BASED_OUTPATIENT_CLINIC_OR_DEPARTMENT_OTHER): Payer: Self-pay | Admitting: *Deleted

## 2013-12-19 ENCOUNTER — Ambulatory Visit (HOSPITAL_BASED_OUTPATIENT_CLINIC_OR_DEPARTMENT_OTHER)
Admission: RE | Admit: 2013-12-19 | Discharge: 2013-12-19 | Disposition: A | Discharge status: Home / Self Care | Payer: Medicare Other | Source: Ambulatory Visit | Attending: Urology | Admitting: Urology

## 2013-12-19 DIAGNOSIS — N318 Other neuromuscular dysfunction of bladder: Secondary | ICD-10-CM | POA: Insufficient documentation

## 2013-12-19 DIAGNOSIS — G20A1 Parkinson's disease without dyskinesia, without mention of fluctuations: Secondary | ICD-10-CM | POA: Insufficient documentation

## 2013-12-19 DIAGNOSIS — K219 Gastro-esophageal reflux disease without esophagitis: Secondary | ICD-10-CM | POA: Insufficient documentation

## 2013-12-19 DIAGNOSIS — N529 Male erectile dysfunction, unspecified: Secondary | ICD-10-CM | POA: Insufficient documentation

## 2013-12-19 DIAGNOSIS — N320 Bladder-neck obstruction: Secondary | ICD-10-CM | POA: Diagnosis not present

## 2013-12-19 DIAGNOSIS — G20 Parkinson's disease: Secondary | ICD-10-CM | POA: Diagnosis not present

## 2013-12-19 DIAGNOSIS — I10 Essential (primary) hypertension: Secondary | ICD-10-CM | POA: Diagnosis not present

## 2013-12-19 DIAGNOSIS — Z7982 Long term (current) use of aspirin: Secondary | ICD-10-CM | POA: Diagnosis not present

## 2013-12-19 DIAGNOSIS — N3941 Urge incontinence: Secondary | ICD-10-CM | POA: Diagnosis not present

## 2013-12-19 DIAGNOSIS — R3129 Other microscopic hematuria: Secondary | ICD-10-CM | POA: Diagnosis not present

## 2013-12-19 DIAGNOSIS — R972 Elevated prostate specific antigen [PSA]: Secondary | ICD-10-CM | POA: Diagnosis present

## 2013-12-19 DIAGNOSIS — Z79899 Other long term (current) drug therapy: Secondary | ICD-10-CM | POA: Insufficient documentation

## 2013-12-19 DIAGNOSIS — F411 Generalized anxiety disorder: Secondary | ICD-10-CM | POA: Diagnosis not present

## 2013-12-19 DIAGNOSIS — C679 Malignant neoplasm of bladder, unspecified: Secondary | ICD-10-CM | POA: Insufficient documentation

## 2013-12-19 HISTORY — PX: CYSTOSCOPY WITH STENT PLACEMENT: SHX5790

## 2013-12-19 HISTORY — DX: Unspecified hearing loss, unspecified ear: H91.90

## 2013-12-19 HISTORY — PX: TRANSURETHRAL RESECTION OF BLADDER TUMOR: SHX2575

## 2013-12-19 HISTORY — DX: Overactive bladder: N32.81

## 2013-12-19 HISTORY — DX: Other intervertebral disc degeneration, lumbar region: M51.36

## 2013-12-19 HISTORY — DX: Nocturia: R35.1

## 2013-12-19 HISTORY — DX: Urge incontinence: N39.41

## 2013-12-19 HISTORY — DX: Obstructive sleep apnea (adult) (pediatric): G47.33

## 2013-12-19 HISTORY — DX: Major depressive disorder, single episode, unspecified: F32.9

## 2013-12-19 HISTORY — DX: Allergic rhinitis, unspecified: J30.9

## 2013-12-19 HISTORY — DX: Frequency of micturition: R35.0

## 2013-12-19 HISTORY — DX: Depression, unspecified: F32.A

## 2013-12-19 HISTORY — DX: Elevated prostate specific antigen (PSA): R97.20

## 2013-12-19 HISTORY — DX: Other intervertebral disc degeneration, lumbar region without mention of lumbar back pain or lower extremity pain: M51.369

## 2013-12-19 HISTORY — DX: Benign prostatic hyperplasia without lower urinary tract symptoms: N40.0

## 2013-12-19 LAB — POCT I-STAT 4, (NA,K, GLUC, HGB,HCT)
Glucose, Bld: 98 mg/dL (ref 70–99)
HEMATOCRIT: 46 % (ref 39.0–52.0)
Hemoglobin: 15.6 g/dL (ref 13.0–17.0)
Potassium: 3.9 mEq/L (ref 3.7–5.3)
Sodium: 141 mEq/L (ref 137–147)

## 2013-12-19 SURGERY — CYSTOSCOPY, WITH STENT INSERTION
Anesthesia: General | Site: Ureter

## 2013-12-19 MED ORDER — PROMETHAZINE HCL 25 MG/ML IJ SOLN
6.2500 mg | INTRAMUSCULAR | Status: DC | PRN
  Filled 2013-12-19: qty 1

## 2013-12-19 MED ORDER — NITROFURANTOIN MONOHYD MACRO 100 MG PO CAPS: 100.0000 mg | ORAL_CAPSULE | Freq: Two times a day (BID) | ORAL | Status: DC

## 2013-12-19 MED ORDER — LIDOCAINE HCL (CARDIAC) 20 MG/ML IV SOLN
INTRAVENOUS | Status: DC | PRN
  Administered 2013-12-19: 80 mg via INTRAVENOUS

## 2013-12-19 MED ORDER — ONDANSETRON HCL 4 MG/2ML IJ SOLN
INTRAMUSCULAR | Status: DC | PRN
  Administered 2013-12-19: 4 mg via INTRAVENOUS

## 2013-12-19 MED ORDER — DEXAMETHASONE SODIUM PHOSPHATE 4 MG/ML IJ SOLN
INTRAMUSCULAR | Status: DC | PRN
  Administered 2013-12-19: 8 mg via INTRAVENOUS

## 2013-12-19 MED ORDER — IOHEXOL 350 MG/ML SOLN
INTRAVENOUS | Status: DC | PRN
  Administered 2013-12-19: 1 mL

## 2013-12-19 MED ORDER — CEFAZOLIN SODIUM-DEXTROSE 2-3 GM-% IV SOLR
2.0000 g | INTRAVENOUS | Status: AC
  Administered 2013-12-19: 2 g via INTRAVENOUS
  Filled 2013-12-19: qty 50

## 2013-12-19 MED ORDER — BELLADONNA ALKALOIDS-OPIUM 16.2-60 MG RE SUPP
RECTAL | Status: AC
  Filled 2013-12-19: qty 1

## 2013-12-19 MED ORDER — SODIUM CHLORIDE 0.9 % IR SOLN
Status: DC | PRN
  Administered 2013-12-19: 10000 mL

## 2013-12-19 MED ORDER — FENTANYL CITRATE 0.05 MG/ML IJ SOLN
INTRAMUSCULAR | Status: AC
  Filled 2013-12-19: qty 6

## 2013-12-19 MED ORDER — PROPOFOL 10 MG/ML IV BOLUS
INTRAVENOUS | Status: DC | PRN
  Administered 2013-12-19: 150 mg via INTRAVENOUS

## 2013-12-19 MED ORDER — FENTANYL CITRATE 0.05 MG/ML IJ SOLN
INTRAMUSCULAR | Status: DC | PRN
  Administered 2013-12-19 (×2): 25 ug via INTRAVENOUS
  Administered 2013-12-19: 50 ug via INTRAVENOUS
  Administered 2013-12-19 (×5): 25 ug via INTRAVENOUS

## 2013-12-19 MED ORDER — PHENAZOPYRIDINE HCL 100 MG PO TABS: 100.0000 mg | ORAL_TABLET | Freq: Three times a day (TID) | ORAL | Status: DC | PRN

## 2013-12-19 MED ORDER — LIDOCAINE HCL 2 % EX GEL
CUTANEOUS | Status: DC | PRN
  Administered 2013-12-19: 1 via URETHRAL

## 2013-12-19 MED ORDER — FENTANYL CITRATE 0.05 MG/ML IJ SOLN
25.0000 ug | INTRAMUSCULAR | Status: DC | PRN
  Filled 2013-12-19: qty 1

## 2013-12-19 MED ORDER — MEPERIDINE HCL 25 MG/ML IJ SOLN
6.2500 mg | INTRAMUSCULAR | Status: DC | PRN
  Filled 2013-12-19: qty 1

## 2013-12-19 MED ORDER — CEFAZOLIN SODIUM 1-5 GM-% IV SOLN
1.0000 g | INTRAVENOUS | Status: DC
  Filled 2013-12-19: qty 50

## 2013-12-19 MED ORDER — LACTATED RINGERS IV SOLN
INTRAVENOUS | Status: DC
  Administered 2013-12-19: 07:00:00 via INTRAVENOUS
  Filled 2013-12-19: qty 1000

## 2013-12-19 MED ORDER — MITOMYCIN CHEMO FOR BLADDER INSTILLATION 40 MG
40.0000 mg | Freq: Once | INTRAVENOUS | Status: DC
Start: 2013-12-19 — End: 2013-12-19

## 2013-12-19 MED ORDER — KETOROLAC TROMETHAMINE 30 MG/ML IJ SOLN
INTRAMUSCULAR | Status: DC | PRN
  Administered 2013-12-19: 15 mg via INTRAVENOUS

## 2013-12-19 SURGICAL SUPPLY — 48 items
ADAPTER CATH URET PLST 4-6FR (CATHETERS) IMPLANT
ADPR CATH URET STRL DISP 4-6FR (CATHETERS)
BAG DRAIN URO-CYSTO SKYTR STRL (DRAIN) ×3 IMPLANT
BAG DRN ANRFLXCHMBR STRAP LEK (BAG)
BAG DRN UROCATH (DRAIN) ×2
BAG URINE DRAINAGE (UROLOGICAL SUPPLIES) ×1 IMPLANT
BAG URINE LEG 19OZ MD ST LTX (BAG) IMPLANT
CANISTER SUCT LVC 12 LTR MEDI- (MISCELLANEOUS) ×1 IMPLANT
CATH COUDE FOLEY 2W 5CC 18FR (CATHETERS) ×1 IMPLANT
CATH FOLEY 2WAY SLVR  5CC 20FR (CATHETERS)
CATH FOLEY 2WAY SLVR  5CC 22FR (CATHETERS)
CATH FOLEY 2WAY SLVR 5CC 20FR (CATHETERS) IMPLANT
CATH FOLEY 2WAY SLVR 5CC 22FR (CATHETERS) IMPLANT
CATH INTERMIT  6FR 70CM (CATHETERS) ×1 IMPLANT
CATH URET 5FR 28IN CONE TIP (BALLOONS)
CATH URET 5FR 28IN OPEN ENDED (CATHETERS) IMPLANT
CATH URET 5FR 70CM CONE TIP (BALLOONS) IMPLANT
CLOTH BEACON ORANGE TIMEOUT ST (SAFETY) ×3 IMPLANT
DRAPE CAMERA CLOSED 9X96 (DRAPES) ×3 IMPLANT
DRESSING TELFA 8X3 (GAUZE/BANDAGES/DRESSINGS) IMPLANT
ELECT BUTTON BIOP 24F 90D PLAS (MISCELLANEOUS) IMPLANT
ELECT LOOP HF 26F 30D .35MM (CUTTING LOOP) IMPLANT
ELECT LOOP MED HF 24F 12D CBL (CLIP) ×1 IMPLANT
ELECT REM PT RETURN 9FT ADLT (ELECTROSURGICAL) ×3
ELECTRODE REM PT RTRN 9FT ADLT (ELECTROSURGICAL) ×2 IMPLANT
EVACUATOR MICROVAS BLADDER (UROLOGICAL SUPPLIES) IMPLANT
GLOVE BIO SURGEON STRL SZ 6.5 (GLOVE) ×2 IMPLANT
GLOVE BIO SURGEON STRL SZ7.5 (GLOVE) ×3 IMPLANT
GLOVE BIOGEL PI IND STRL 6.5 (GLOVE) IMPLANT
GLOVE BIOGEL PI INDICATOR 6.5 (GLOVE) ×2
GOWN PREVENTION PLUS LG XLONG (DISPOSABLE) ×2 IMPLANT
GOWN STRL REIN XL XLG (GOWN DISPOSABLE) ×2 IMPLANT
GOWN STRL REUS W/TWL LRG LVL3 (GOWN DISPOSABLE) ×2 IMPLANT
GOWN STRL REUS W/TWL XL LVL3 (GOWN DISPOSABLE) ×1 IMPLANT
GUIDEWIRE 0.038 PTFE COATED (WIRE) IMPLANT
GUIDEWIRE ANG ZIPWIRE 038X150 (WIRE) IMPLANT
GUIDEWIRE STR DUAL SENSOR (WIRE) ×3 IMPLANT
HOLDER FOLEY CATH W/STRAP (MISCELLANEOUS) ×1 IMPLANT
IV NS 1000ML (IV SOLUTION) ×3
IV NS 1000ML BAXH (IV SOLUTION) IMPLANT
IV NS IRRIG 3000ML ARTHROMATIC (IV SOLUTION) ×2 IMPLANT
LOOP CUTTING 24FR OLYMPUS (CUTTING LOOP) IMPLANT
NS IRRIG 500ML POUR BTL (IV SOLUTION) ×1 IMPLANT
PACK CYSTOSCOPY (CUSTOM PROCEDURE TRAY) ×3 IMPLANT
PLUG CATH AND CAP STER (CATHETERS) IMPLANT
SET ASPIRATION TUBING (TUBING) IMPLANT
STENT URET 6FRX26 CONTOUR (STENTS) ×1 IMPLANT
SYRINGE 12CC LL (MISCELLANEOUS) ×1 IMPLANT

## 2013-12-19 NOTE — Anesthesia Procedure Notes (Signed)
Procedure Name: LMA Insertion Date/Time: 12/19/2013 7:42 AM Performed by: Denna Haggard D Pre-anesthesia Checklist: Patient identified, Emergency Drugs available, Suction available and Patient being monitored Patient Re-evaluated:Patient Re-evaluated prior to inductionOxygen Delivery Method: Circle System Utilized Preoxygenation: Pre-oxygenation with 100% oxygen Intubation Type: IV induction Ventilation: Mask ventilation without difficulty LMA: LMA inserted LMA Size: 4.0 Number of attempts: 1 Airway Equipment and Method: bite block Placement Confirmation: positive ETCO2 Tube secured with: Tape Dental Injury: Teeth and Oropharynx as per pre-operative assessment

## 2013-12-19 NOTE — Transfer of Care (Signed)
Immediate Anesthesia Transfer of Care Note  Patient: [name]  Procedure(s) Performed: Procedure(s) (LRB): CYSTOSCOPY BLADDER BX, LEFT URETERAL STENT PLACEMENT , LEFT RETROGRADE AND PYLOGRAM (Left)  TRANSURETHRAL RESECTION OF BLADDER TUMOR WITH GYRUS (TURBT-GYRUS) (N/A)  Patient Location: PACU  Anesthesia Type: General  Level of Consciousness: awake, oriented, sedated and patient cooperative  Airway & Oxygen Therapy: Patient Spontanous Breathing and Patient connected to face mask oxygen  Post-op Assessment: Report given to PACU RN and Post -op Vital signs reviewed and stable  Post vital signs: Reviewed and stable  Complications: No apparent anesthesia complications

## 2013-12-19 NOTE — Discharge Instructions (Signed)
Post Anesthesia Home Care Instructions  Activity: Get plenty of rest for the remainder of the day. A responsible adult should stay with you for 24 hours following the procedure.  For the next 24 hours, DO NOT: -Drive a car -Paediatric nurse -Drink alcoholic beverages -Take any medication unless instructed by your physician -Make any legal decisions or sign important papers.  Meals: Start with liquid foods such as gelatin or soup. Progress to regular foods as tolerated. Avoid greasy, spicy, heavy foods. If nausea and/or vomiting occur, drink only clear liquids until the nausea and/or vomiting subsides. Call your physician if vomiting continues.  Special Instructions/Symptoms: Your throat may feel dry or sore from the anesthesia or the breathing tube placed in your throat during surgery. If this causes discomfort, gargle with warm salt water. The discomfort should disappear within 24 hours.  [facility] [phone] Post Ureteroscopy With or Without Stent Instructions  Definitions:  Ureter: The duct that transports urine from the kidney to the bladder. Stent:   A plastic hollow tube that is placed into the ureter, from the kidney to the                 bladder to prevent the ureter from swelling shut.  GENERAL INSTRUCTIONS:  Despite the fact that no skin incisions were used, the area around the ureter and bladder is raw and irritated. The stent is a foreign body which will further irritate the bladder wall. This irritation is manifested by increased frequency of urination, both day and night, and by an increase in the urge to urinate. In some, the urge to urinate is present almost always. Sometimes the urge is strong enough that you may not be able to stop yourself from urinating. The only real cure is to remove the stent and then give time for the bladder wall to heal which can't be done until the danger of the ureter swelling shut has passed, which varies.  You may  see some blood in your urine while the stent is in place and a few days afterwards. Do not be alarmed, even if the urine was clear for a while. Get off your feet and drink lots of fluids until clearing occurs. If you start to pass clots or don't improve, call us.  DIET: You may return to your normal diet immediately. Because of the raw surface of your bladder, alcohol, spicy foods, acid type foods and drinks with caffeine may cause irritation or frequency and should be used in moderation. To keep your urine flowing freely and to avoid constipation, drink plenty of fluids during the day ( 8-10 glasses ). Tip: Avoid cranberry juice because it is very acidic.  ACTIVITY: Your physical activity doesn't need to be restricted. However, if you are very active, you may see some blood in your urine. We suggest that you reduce your activity under these circumstances until the bleeding has stopped.  BOWELS: It is important to keep your bowels regular during the postoperative period. Straining with bowel movements can cause bleeding. A bowel movement every other day is reasonable. Use a mild laxative if needed, such as Milk of Magnesia 2-3 tablespoons, or 2 Dulcolax tablets. Call if you continue to have problems. If you have been taking narcotics for pain, before, during or after your surgery, you may be constipated. Take a laxative if necessary.   MEDICATION: You should resume your pre-surgery medications unless told not to. In addition you will often be given an antibiotic to prevent infection.  These should be taken as prescribed until the bottles are finished unless you are having an unusual reaction to one of the drugs.  PROBLEMS YOU SHOULD REPORT TO [place]:  Fevers over 100.5 Fahrenheit.  Heavy bleeding, or clots ( See above notes about blood in urine ).  Inability to urinate.  Drug reactions ( hives, rash, nausea, vomiting, diarrhea ).  Severe burning or pain with urination that is not  improving.  FOLLOW-UP: You will need a follow-up appointment to monitor your progress. Call for this appointment at the number listed above. Usually the first appointment will be about three to fourteen days after your surgery.  Indwelling Urinary Catheter Care You have been given a flexible tube (catheter) used to drain the bladder. Catheters are often used when a person has difficulty urinating due to blockage, bleeding, infection, or inability to control bladder or bowel movements (incontinence). A catheter requires daily care to prevent infection and blockage. HOME CARE INSTRUCTIONS  Do the following to reduce the risk of infection. Antibiotic medicines cannot prevent infections. Limit the number of bacteria entering your bladder  Wash your hands for 2 minutes with soapy water before and after handling the catheter.  Wash your bottom and the entire catheter twice daily, as well as after each bowel movement. Wash the tip of the penis or just above the vaginal opening with soap and warm water, rinse, and then wash the rectal area. Always wash from front to back.  When changing from the leg bag to overnight bag or from the overnight bag to leg bag, thoroughly clean the end of the catheter where it connects to the tubing with an alcohol wipe.  Clean the leg bag and overnight bag daily after use. Replace your drainage bags weekly.  Always keep the tubing and bag below the level of your bladder. This allows your urine to drain properly. Lifting the bag or tubing above the level of your bladder will cause dirty urine to flow back into your bladder. If you must briefly lift the bag higher than your bladder, pinch the catheter or tubing to prevent backflow.  Drink enough water and fluids to keep your urine clear or pale yellow, or as directed by your caregiver. This will flush bacteria out of the bladder. Protect tissues from injury  Attach the catheter to your leg so there is no tension on the  catheter. Use adhesive tape or a leg strap. If you are using adhesive tape, remove any sticky residue left behind by the previous tape you used.  Place your leg bag on your lower leg. Fasten the straps securely and comfortably.  Do not remove the catheter yourself unless you have been instructed how to do so. Keep the urinary pathway open  Check throughout the day to be sure your catheter is working and urine is draining freely. Make sure the tubing does not become kinked.  Do not let the drainage bag overfill. SEEK IMMEDIATE MEDICAL CARE IF:   The catheter becomes blocked. Urine is not draining.  Urine is leaking.  You have any pain.  You have a fever. Document Released: 04/06/2005 Document Revised: 03/23/2012 Document Reviewed: 09/05/2009 [facility] Patient Information 2015 [place], [place]. This information is not intended to replace advice given to you by your health care provider. Make sure you discuss any questions you have with your health care provider.

## 2013-12-19 NOTE — Interval H&P Note (Signed)
History and Physical Interval Note:  12/19/2013 7:24 AM  [name]  has presented today for surgery, with the diagnosis of BLADDER NEOPLASM   The various methods of treatment have been discussed with the patient and family. After consideration of risks, benefits and other options for treatment, the patient has consented to  Procedure(s): CYSTOSCOPY BLADDER BX, LEFT URETERAL STENT PLACEMENT AND MITOMYCIN C  (Left) POSSIBLE TRANSURETHRAL RESECTION OF BLADDER TUMOR WITH GYRUS (TURBT-GYRUS) (N/A) as a surgical intervention .  The patient's history has been reviewed, patient examined, no change in status, stable for surgery.  I have reviewed the patient's chart and labs.  Questions were answered to the patient's satisfaction.  Pt has been well. No dysuria, fever. Discussed again possible stent, possible foley.    Festus Aloe

## 2013-12-19 NOTE — Anesthesia Postprocedure Evaluation (Signed)
  Anesthesia Post-op Note  Patient: Barnie Mort  Procedure(s) Performed: Procedure(s) (LRB): CYSTOSCOPY BLADDER BX, LEFT URETERAL STENT PLACEMENT , LEFT RETROGRADE AND PYLOGRAM (Left)  TRANSURETHRAL RESECTION OF BLADDER TUMOR WITH GYRUS (TURBT-GYRUS) (N/A)  Patient Location: PACU  Anesthesia Type: General  Level of Consciousness: awake and alert   Airway and Oxygen Therapy: Patient Spontanous Breathing  Post-op Pain: mild  Post-op Assessment: Post-op Vital signs reviewed, Patient's Cardiovascular Status Stable, Respiratory Function Stable, Patent Airway and No signs of Nausea or vomiting  Last Vitals:  Filed Vitals:   12/19/13 0945  BP: 154/75  Pulse: 81  Temp:   Resp: 20    Post-op Vital Signs: stable   Complications: No apparent anesthesia complications

## 2013-12-19 NOTE — Op Note (Signed)
Preoperative diagnosis: Bladder neoplasm, elevated PSA Postoperative diagnosis: Bladder neoplasm, elevated PSA  Procedure: Exam under anesthesia Cystoscopy TURBT 2-5 cm Left retrograde pyelogram Left ureteral stent placement  Surgeon: Junious Silk Anesthesia: [place]  Type of anesthesia: Gen.  Findings: On exam under anesthesia the penis appeared normal without mass or lesion. The testicles were palpably normal without mass or lesion. On digital rectal exam the prostate was mildly enlarged but smooth without hard areas or nodules. All landmarks were preserved.  On cystoscopy the urethra appeared normal. The prostatic urethra was not obstructed although there was a small median lobe which might be causing a ball-valve affect. There was a flat tumor that spread along the left bladder neck, trigone, covered the ureteral orifice and up along the left bladder wall. This had grown into a mound just lateral to the left UO. Resection of the left ureteral orifice was required to clear all the tumor.  the tumor appeared flat, high-grade but superficial  Left retrograde pyelogram-this outlined a single ureter single collecting system unit. The collecting system and renal pelvis and proximal ureter all appeared normal without filling defect, dilation or stricture.  Description of procedure: After consent was obtained patient brought to the operating room. After adequate anesthesia he was placed in lithotomy position and prepped and draped in the usual sterile fashion. A timeout was performed to confirm the patient and procedure. An exam under anesthesia was performed. Cystoscope was passed per urethra and the bladder carefully examined with a 12, 30 and 70 lens. I used the cold cup biopsy to biopsy the flattest area on the left bladder wall but all of these areas were contiguous. The cystoscope was removed and the resectoscope was placed. I did have to dilate the fossa navicularis to 28 [place] to allow  access of the resectoscope. The resectoscope was placed with the visual obturator then the loop was placed. All the abnormal tumor was resected. The ureteral orifice was resected until it was clean. I carefully cauterized around the ureteral orifice to provide adequate hemostasis.  The cystoscope was replaced and the ureteral orifice located and a sensor wire advanced and coiled in the left collecting system. A 6 French open-ended catheter was advanced into the region of the left proximal ureter. The wire was removed and retrograde injection of contrast was performed confirming access to the collecting system. The wire was replaced and a 6 [place] open-ended catheter was removed. A 6 x 26 cm stent was advanced. The wire was removed with a good coil reconstituted in the left upper pole collecting system and a good coil in the bladder.  The resectoscope was repassed to provide better hemostasis. All the biopsied areas were fulgurated all the edges were fulgurated. There were no other abnormal mucosal areas. The median lobe had the typical copious amounts of prostatic mucosal vessels which were oozing. A fulgurated some of these. There was excellent hemostasis and the bladder was filled. The scope was removed and an 31 [place] coud catheter was placed. The drainage was clear. I inflated the balloon and seated at the bladder neck.  The patient was awakened and taken to the recovery room in stable condition.  Disposition stable to PACU  EBL: Minimal  Complications: None  Drains: 6 x 26 cm left ureteral stent  Specimens: #1 left bladder biopsy #2 bladder tumor, left

## 2013-12-19 NOTE — Anesthesia Preprocedure Evaluation (Signed)
Anesthesia Evaluation  Patient identified by MRN, date of birth, ID band Patient awake    Reviewed: Allergy & Precautions, H&P , NPO status , Patient's Chart, lab work & pertinent test results  Airway Mallampati: II TM Distance: >3 FB Neck ROM: full    Dental no notable dental hx. (+) Teeth Intact, Dental Advisory Given   Pulmonary neg pulmonary ROS, sleep apnea , former smoker,  breath sounds clear to auscultation  Pulmonary exam normal       Cardiovascular Exercise Tolerance: Good hypertension, Pt. on medications negative cardio ROS  Rhythm:regular Rate:Normal     Neuro/Psych Depression Parkinson's disease  Neuromuscular disease negative neurological ROS  negative psych ROS   GI/Hepatic negative GI ROS, Neg liver ROS,   Endo/Other  negative endocrine ROS  Renal/GU negative Renal ROS  negative genitourinary   Musculoskeletal   Abdominal   Peds  Hematology negative hematology ROS (+)   Anesthesia Other Findings   Reproductive/Obstetrics negative OB ROS                           Anesthesia Physical  Anesthesia Plan  ASA: III  Anesthesia Plan: General   Post-op Pain Management:    Induction: Intravenous  Airway Management Planned: LMA  Additional Equipment:   Intra-op Plan:   Post-operative Plan:   Informed Consent: I have reviewed the patients History and Physical, chart, labs and discussed the procedure including the risks, benefits and alternatives for the proposed anesthesia with the patient or authorized representative who has indicated his/her understanding and acceptance.   Dental Advisory Given  Plan Discussed with: CRNA and Surgeon  Anesthesia Plan Comments:         Anesthesia Quick Evaluation

## 2013-12-20 ENCOUNTER — Encounter (HOSPITAL_BASED_OUTPATIENT_CLINIC_OR_DEPARTMENT_OTHER): Payer: Self-pay | Admitting: Urology

## 2013-12-26 ENCOUNTER — Ambulatory Visit: Payer: Medicare Other | Admitting: Neurology

## 2013-12-29 ENCOUNTER — Ambulatory Visit (INDEPENDENT_AMBULATORY_CARE_PROVIDER_SITE_OTHER): Payer: No Typology Code available for payment source | Admitting: Psychology

## 2013-12-29 DIAGNOSIS — F331 Major depressive disorder, recurrent, moderate: Secondary | ICD-10-CM

## 2014-01-02 ENCOUNTER — Ambulatory Visit (INDEPENDENT_AMBULATORY_CARE_PROVIDER_SITE_OTHER): Payer: Medicare Other | Admitting: Neurology

## 2014-01-02 ENCOUNTER — Encounter: Payer: Self-pay | Admitting: Neurology

## 2014-01-02 VITALS — BP 128/68 | HR 86 | Ht 70.5 in | Wt 186.0 lb

## 2014-01-02 DIAGNOSIS — G20 Parkinson's disease: Secondary | ICD-10-CM

## 2014-01-02 DIAGNOSIS — F330 Major depressive disorder, recurrent, mild: Secondary | ICD-10-CM

## 2014-01-02 DIAGNOSIS — G3184 Mild cognitive impairment, so stated: Secondary | ICD-10-CM

## 2014-01-02 NOTE — Progress Notes (Signed)
[name] was seen today in the movement disorders clinic for neurologic consultation at the request of Annye Asa, MD.  The consultation is for the evaluation of PD.  The pt is accompanied by his wife who supplements the hx.  The patient was previously seen by [name] and I did have the opportunity to review those records.  Patient is wishing to transfer care because of the length of travel to [facility].  The first symptom(s) the patient noticed was unilateral hand tremor of the L hand and this was approximately 2005.  Pt is L hand dominant.   Pt went to his PCP and he was referred to [facility] and was dx with PD.  I do not have those records. Pt believes that he was started on requip but doesn't think that it helped.  He thinks that he was on it for about a year and his wife believes that it caused sleep attacks.  He is not sure what the next medication was but the physician at [facility] left and he was then referred to [name] and he has been seeing him and his NP since.  The patient is currently on carbidopa/levodopa 50/200 CR 1-1/2 tablets at 8 AM/ and then one at noon/4pm/8pm along with a carbidopa/levodopa 25/100 in the afternoon and in the evening.  He admits that he often misses dosages.  He is on amantadine 100 mg 4 times a day and selegiline 5 mg twice a day.  01/02/14 update:  The patient returns today for followup.  Last visit, I discontinued the patient's extended release levodopa as he was on a strange combination of immediate release and extended release carbidopa/levodopa.  He is currently on carbidopa/levodopa 25/100 IR and takes 2 tablets at 8 AM/noon/4 PM/8 PM and then carbidopa/levodopa 50/200 at night.  Pt states that the first week after we changed, he felt very sleepy but he no longer feels that way.  I asked him to move his selegiline to 8 AM and noon because of reported insomnia.  The patient did have a modified barium swallow test done since last  visit.  That was normal.  Since our last visit, the patient did have a bladder neoplasm removed on 12/19/2013 and a stent was placed.  The patient has recovered well.  He still has quite a bit of hematuria though.  He does need the stent removed.  No falls.  Limited exercise.  Was referred to neurorehab since last visit but states that he chose not to go.  Going to see mental health counselor at [facility] and that is helping.  Remains on amantadine.   PREVIOUS MEDICATIONS: Sinemet, Sinemet CR, Requip and Eldpryl Requip (sleep attacks)  ALLERGIES:  No Known Allergies  CURRENT MEDICATIONS:  Current Outpatient Prescriptions on File Prior to Visit  Medication Sig Dispense Refill  . ALPHA LIPOIC ACID PO Take 1 capsule by mouth daily.      Marland Kitchen amantadine (SYMMETREL) 100 MG capsule Take 100 mg by mouth 4 (four) times daily.       Marland Kitchen aspirin 81 MG tablet Take 162 mg by mouth daily.       Marland Kitchen atorvastatin (LIPITOR) 40 MG tablet TAKE ONE TABLET BY MOUTH ONE TIME DAILY-- TAKES IN PM      . Ca Carbonate-Mag Hydroxide (ROLAIDS PO) Take by mouth as needed.      . carbidopa-levodopa (SINEMET CR) 50-200 MG per tablet Take 1 tablet by mouth at bedtime.  30 tablet  5  . carbidopa-levodopa (SINEMET IR) 25-100 MG per tablet Take 2 tablets by mouth 4 (four) times daily.  240 tablet  5  . celecoxib (CELEBREX) 200 MG capsule TAKE ONE CAPSULE BY MOUTH ONE TIME DAILY   30 capsule  4  . diclofenac sodium (VOLTAREN) 1 % GEL Apply topically as needed.      Marland Kitchen escitalopram (LEXAPRO) 10 MG tablet Take 10 mg by mouth every evening.      . fexofenadine (ALLEGRA) 180 MG tablet Take 180 mg by mouth daily.       . fluocinonide cream (LIDEX) 6.20 % Apply 1 application topically as needed.      . fluticasone (FLONASE) 50 MCG/ACT nasal spray Use two sprays in each nostril daily  16 g  4  . ibuprofen (ADVIL,MOTRIN) 200 MG tablet Take 200 mg by mouth every 6 (six) hours as needed.      Marland Kitchen ketoconazole (NIZORAL) 2 % cream Apply  1 application topically daily. Use on forehead      . losartan (COZAAR) 100 MG tablet Take 100 mg by mouth every evening.      Marland Kitchen oxyCODONE-acetaminophen (PERCOCET) 5-325 MG per tablet Take 1 tablet by mouth every 4 (four) hours as needed. For back pain      . polyethylene glycol (MIRALAX / GLYCOLAX) packet Take 17 g by mouth daily as needed. For laxative      . Probiotic Product (PROBIOTIC DAILY PO) Take 1 capsule by mouth daily.      . selegiline (ELDEPRYL) 5 MG capsule Take 5 mg by mouth 2 (two) times daily.       . tamsulosin (FLOMAX) 0.4 MG CAPS capsule Take 0.4 mg by mouth daily.      . TURMERIC PO Take 1 capsule by mouth daily.       No current facility-administered medications on file prior to visit.    PAST MEDICAL HISTORY:   Past Medical History  Diagnosis Date  . Hyperlipidemia   . Hypertension   . Parkinson disease DX   2005    NEUROLOGIST-   DR Adisen Bennion--  IDIOPATHIC PARKINSON'S /  TREMORS CONTROLLED WITH MEDS  . GERD (gastroesophageal reflux disease)   . Allergic rhinitis   . OAB (overactive bladder)   . Frequency of urination   . Urge urinary incontinence   . Nocturia   . BPH with elevated PSA   . Osteoarthritis     KNEES, SHOULDER  . DDD (degenerative disc disease), lumbar   . Hearing loss     does not wear his hearing aid  . Mild obstructive sleep apnea     PER PT  MILD OSA , NO CPAP RX,  STUDY DONE 2009  . Depression     PAST SURGICAL HISTORY:   Past Surgical History  Procedure Laterality Date  . Nasal sinus surgery  1982  . Inguinal hernia repair  03/09/2012    Procedure: HERNIA REPAIR INGUINAL ADULT;  Surgeon: Adin Hector, MD;  Location: [facility];  Service: General;  Laterality: Right;  . Insertion of mesh  03/09/2012    Procedure: INSERTION OF MESH;  Surgeon: Adin Hector, MD;  Location: [facility];  Service: General;  Laterality: Right;  . Removal benign cyst left forehead  1969  . Orif right arm fx  1949    HARDWARE REMOVED   . Cataract extraction w/  intraocular lens  implant, bilateral  2014  . Cystoscopy with stent placement Left 12/19/2013  Procedure: CYSTOSCOPY BLADDER BX, LEFT URETERAL STENT PLACEMENT , LEFT RETROGRADE AND PYLOGRAM;  Surgeon: Festus Aloe, MD;  Location: [facility];  Service: Urology;  Laterality: Left;  . Transurethral resection of bladder tumor N/A 12/19/2013    Procedure:  TRANSURETHRAL RESECTION OF BLADDER TUMOR WITH GYRUS (TURBT-GYRUS);  Surgeon: Festus Aloe, MD;  Location: [facility];  Service: Urology;  Laterality: N/A;    SOCIAL HISTORY:   History   Social History  . Marital Status: Married    Spouse Name: N/A    Number of Children: N/A  . Years of Education: N/A   Occupational History  . Not on file.   Social History Main Topics  . Smoking status: Former Smoker -- 1.00 packs/day for 15 years    Types: Cigarettes    Quit date: 04/20/1968  . Smokeless tobacco: Never Used  . Alcohol Use: 4.2 oz/week    7 Glasses of wine per week     Comment: ONE WINE PER DAY  . Drug Use: No  . Sexual Activity: Not on file   Other Topics Concern  . Not on file   Social History Narrative  . No narrative on file    FAMILY HISTORY:   Family Status  Relation Status Death Age  . Mother Deceased     alzeihmer's disease  . Father Deceased     liver cancer, colon cancer  . Brother Alive   . Brother Deceased     killed   . Daughter Alive     ROS:  A complete 10 system review of systems was obtained and was unremarkable apart from what is mentioned above.  PHYSICAL EXAMINATION:    VITALS:   Filed Vitals:   01/02/14 1032  BP: 128/68  Pulse: 86  Height: 5' 10.5" (1.791 m)  Weight: 186 lb (84.369 kg)   Wt Readings from Last 3 Encounters:  01/02/14 186 lb (84.369 kg)  12/19/13 190 lb (86.183 kg)  12/19/13 190 lb (86.183 kg)    GEN:  The patient appears stated age and is in NAD. HEENT:  Normocephalic, atraumatic.  The mucous membranes are moist. The superficial  temporal arteries are without ropiness or tenderness. CV:  RRR Lungs:  CTAB Neck/HEME:  There are no carotid bruits bilaterally.  Neurological examination:  Orientation: The patient is alert and oriented x3. Fund of knowledge is appropriate.  Recent and remote memory are intact.  Attention and concentration are normal.    Able to name objects and repeat phrases. Cranial nerves: There is good facial symmetry.  There is mild facial hypomimia.   Extraocular muscles are intact. The visual fields are full to confrontational testing. The speech is fluent and clear.  The patient has some difficulty with the guttural sounds.  He is mildly hypophonic.  Soft palate rises symmetrically and there is no tongue deviation. Hearing is intact to conversational tone. Sensation: Sensation is intact to light touch throughout. Motor: Strength is 5/5 in the bilateral upper and lower extremities.   Shoulder shrug is equal and symmetric.  There is no pronator drift.   Movement examination: Tone: There is normal tone in the bilateral upper extremities.  The tone in the lower extremities is normal.  Abnormal movements: There is minor tremor in the bilateral UE.  No dyskinesia today. Coordination:  There is no signifcant decremation with RAM's Gait and Station: The patient has no significant difficulty arising out of a deep-seated chair without the use of the hands. The patient's  stride length is slightly decreased.  There is slight increase in tremor in the L hand with ambulation.  ASSESSMENT/PLAN:  1.  Idiopathic Parkinson's disease  -The patient looks better today now that he is off of the combination of CR and IR and solely on carbidopa/levodopa immediate release treatment daytime.  He is taking 25/100 IR 2 tablets at 8 AM/noon/4 PM/8 PM and then does take the 50/200 CR at bedtime.  -For now, the patient will remain on selegiline at 8 AM and noon and the amantadine.  He has been on amantadine for a long time,  presumably for tremor.  I explained to him that this medication also helps dyskinesia.  He denies any dyskinesia ever, although he had mild dyskinesia the first visit I saw him.  -I. encouraged again safe, cardiovascular exercise.  Greater than 50% of the 45 minute visit in counseling regarding safety as well as what to expect now as well as in the future. 2.  Dysphagia.  -MBE on 09/29/13 was normal 3.  Depression.  -He is on Lexapro.  I think that cardiovascular exercise would greatly help.  He is seeing a Licensed conveyancer and is actually doing better. 4.  mild cognitive impairment.  -This is likely related to the Parkinson's, but I also think that depression contributes to memory change.  I do not see dementia at this point. 5.  I will plan on seeing him back in the next few months, sooner should new neurologic issues arise.

## 2014-01-10 ENCOUNTER — Telehealth: Payer: Self-pay

## 2014-01-10 NOTE — Telephone Encounter (Signed)
Form brought in by patient.  Form filled in and placed in Dr. Birdie Riddle red folder for review, completion, and signature.

## 2014-01-11 NOTE — Telephone Encounter (Signed)
Form completed and signed by Dr. Birdie Riddle.  Called and left message on voice mail making pt aware that the form is ready for pick up.  Copy made of form.  Formed numbered and placed in scan basket for scanning.  Original placed up front for pick up.

## 2014-03-22 ENCOUNTER — Other Ambulatory Visit: Payer: Self-pay | Admitting: Dermatology

## 2014-03-23 ENCOUNTER — Other Ambulatory Visit: Payer: Self-pay | Admitting: Neurology

## 2014-03-23 MED ORDER — SELEGILINE HCL 5 MG PO CAPS: 5.0000 mg | ORAL_CAPSULE | Freq: Two times a day (BID) | ORAL | Status: DC

## 2014-03-23 MED ORDER — AMANTADINE HCL 100 MG PO CAPS: 100.0000 mg | ORAL_CAPSULE | Freq: Four times a day (QID) | ORAL | Status: DC

## 2014-03-23 NOTE — Telephone Encounter (Signed)
Amantadine and Selegiline refill requested. Per last office note- patient to remain on medication. Refill approved and sent to patient's pharmacy.

## 2014-04-26 ENCOUNTER — Ambulatory Visit (INDEPENDENT_AMBULATORY_CARE_PROVIDER_SITE_OTHER): Payer: Medicare Other | Admitting: Family Medicine

## 2014-04-26 ENCOUNTER — Other Ambulatory Visit: Payer: Self-pay | Admitting: Neurology

## 2014-04-26 ENCOUNTER — Ambulatory Visit (INDEPENDENT_AMBULATORY_CARE_PROVIDER_SITE_OTHER): Payer: Medicare Other | Admitting: General Practice

## 2014-04-26 ENCOUNTER — Other Ambulatory Visit: Payer: Self-pay | Admitting: Family Medicine

## 2014-04-26 ENCOUNTER — Encounter: Payer: Self-pay | Admitting: Family Medicine

## 2014-04-26 VITALS — BP 124/80 | HR 66 | Temp 97.9°F | Resp 16 | Ht 69.0 in | Wt 192.2 lb

## 2014-04-26 DIAGNOSIS — G20 Parkinson's disease: Secondary | ICD-10-CM

## 2014-04-26 DIAGNOSIS — Z23 Encounter for immunization: Secondary | ICD-10-CM

## 2014-04-26 DIAGNOSIS — E785 Hyperlipidemia, unspecified: Secondary | ICD-10-CM

## 2014-04-26 DIAGNOSIS — Z0000 Encounter for general adult medical examination without abnormal findings: Secondary | ICD-10-CM

## 2014-04-26 DIAGNOSIS — I10 Essential (primary) hypertension: Secondary | ICD-10-CM

## 2014-04-26 DIAGNOSIS — C670 Malignant neoplasm of trigone of bladder: Secondary | ICD-10-CM

## 2014-04-26 DIAGNOSIS — C679 Malignant neoplasm of bladder, unspecified: Secondary | ICD-10-CM | POA: Insufficient documentation

## 2014-04-26 LAB — HEPATIC FUNCTION PANEL
ALBUMIN: 4.4 g/dL (ref 3.5–5.2)
ALT: 4 U/L (ref 0–53)
AST: 12 U/L (ref 0–37)
Alkaline Phosphatase: 116 U/L (ref 39–117)
BILIRUBIN DIRECT: 0.2 mg/dL (ref 0.0–0.3)
Total Bilirubin: 2.3 mg/dL — ABNORMAL HIGH (ref 0.2–1.2)
Total Protein: 6.8 g/dL (ref 6.0–8.3)

## 2014-04-26 LAB — LIPID PANEL
Cholesterol: 127 mg/dL (ref 0–200)
HDL: 45.8 mg/dL (ref >39.00)
LDL CALC: 70 mg/dL (ref 0–99)
NonHDL: 81.2
Total CHOL/HDL Ratio: 3
Triglycerides: 56 mg/dL (ref 0.0–149.0)
VLDL: 11.2 mg/dL (ref 0.0–40.0)

## 2014-04-26 LAB — CBC WITH DIFFERENTIAL/PLATELET
BASOS ABS: 0.1 10*3/uL (ref 0.0–0.1)
Basophils Relative: 1 % (ref 0.0–3.0)
EOS PCT: 6.9 % — AB (ref 0.0–5.0)
Eosinophils Absolute: 0.4 10*3/uL (ref 0.0–0.7)
HEMATOCRIT: 43.5 % (ref 39.0–52.0)
Hemoglobin: 14 g/dL (ref 13.0–17.0)
LYMPHS ABS: 1.3 10*3/uL (ref 0.7–4.0)
LYMPHS PCT: 23.9 % (ref 12.0–46.0)
MCHC: 32.2 g/dL (ref 30.0–36.0)
MCV: 92.4 fl (ref 78.0–100.0)
MONOS PCT: 9.8 % (ref 3.0–12.0)
Monocytes Absolute: 0.5 10*3/uL (ref 0.1–1.0)
Neutro Abs: 3.2 10*3/uL (ref 1.4–7.7)
Neutrophils Relative %: 58.4 % (ref 43.0–77.0)
Platelets: 206 10*3/uL (ref 150.0–400.0)
RBC: 4.71 Mil/uL (ref 4.22–5.81)
RDW: 14.9 % (ref 11.5–15.5)
WBC: 5.5 10*3/uL (ref 4.0–10.5)

## 2014-04-26 LAB — BASIC METABOLIC PANEL
BUN: 26 mg/dL — ABNORMAL HIGH (ref 6–23)
CHLORIDE: 107 meq/L (ref 96–112)
CO2: 25 mEq/L (ref 19–32)
Calcium: 9.2 mg/dL (ref 8.4–10.5)
Creatinine, Ser: 0.9 mg/dL (ref 0.4–1.5)
GFR: 86.73 mL/min (ref >60.00)
Glucose, Bld: 101 mg/dL — ABNORMAL HIGH (ref 70–99)
POTASSIUM: 4 meq/L (ref 3.5–5.1)
SODIUM: 140 meq/L (ref 135–145)

## 2014-04-26 LAB — TSH: TSH: 0.83 u[IU]/mL (ref 0.35–4.50)

## 2014-04-26 NOTE — Progress Notes (Signed)
Pre visit review using our clinic review tool, if applicable. No additional management support is needed unless otherwise documented below in the visit note. 

## 2014-04-26 NOTE — Assessment & Plan Note (Signed)
Chronic problem, well controlled.  Asymptomatic.  Check labs.  No anticipated med changes. °

## 2014-04-26 NOTE — Assessment & Plan Note (Signed)
[name] PE unchanged from previous.  Due for colonoscopy- encouraged him to schedule w/ GI.  Written screening schedule updated and given to pt.  Check labs.  Anticipatory guidance provided.

## 2014-04-26 NOTE — Patient Instructions (Signed)
Follow up in 6 months to recheck BP and cholesterol We'll notify you of your lab results and make any changes if needed Try and get regular activity- discuss this w/ Dr Tat Call Dr Carlean Purl and discuss repeating your colonoscopy Call with any questions or concerns Happy New Year!!!

## 2014-04-26 NOTE — Assessment & Plan Note (Signed)
New to provider.  Following w/ Dr Junious Silk.  Pt is currently having bladder incontinence due to tx.  Will continue to follow along.

## 2014-04-26 NOTE — Progress Notes (Signed)
   Subjective:    Patient ID: [name], male    DOB: 1933/10/05, 81 y.o.   MRN: [id number]  HPI Here today for CPE.  Risk Factors: HTN- chronic problem, on Losartan. Hyperlipidemia- chronic problem, on Lipitor. Bladder cancer- pt is following w/ Dr Junious Silk.  Now having bladder incontinence Parkinson's- chronic problem, following w/ Dr Tat.  Pt w/ increased pain and stiffness  Physical Activity: limited due to parkinson's and chronic back pain Fall Risk: increased due to Parkison's. Depression: chronic problem, on Lexapro.  Reports sxs are adequately controlled. Hearing: decreased to whispered voice, normal to conversational tones ADL's: independent Cognitive: normal linear thought process, memory and attention intact Home Safety: safe at home, lives w/ wife Height, Weight, BMI, Visual Acuity: see vitals, vision corrected to 20/20 w/ glasses Counseling: due for colonoscopy (as of 2013) but pt is putting this off. Labs Ordered: See A&P Care Plan: See A&P    Review of Systems Patient reports no vision/hearing changes, anorexia, fever ,adenopathy, persistant/recurrent hoarseness, swallowing issues, chest pain, palpitations, edema, persistant/recurrent cough, hemoptysis, dyspnea (rest,exertional, paroxysmal nocturnal), gastrointestinal  bleeding (melena, rectal bleeding), abdominal pain, excessive heart burn, syncope, focal weakness, memory loss, numbness & tingling, hair/nail changes, abnormal bruising/bleeding, musculoskeletal symptoms/signs.   + incontinence of bladder + precancerous skin lesions- following w/ Dr Allyson Sabal    Objective:   Physical Exam General Appearance:    Alert, cooperative, no distress, appears stated age  Head:    Normocephalic, without obvious abnormality, atraumatic  Eyes:    PERRL, conjunctiva/corneas clear, EOM's intact, fundi    benign, both eyes       Ears:    Normal TM's and external ear canals, both ears  Nose:   Nares normal, septum midline, mucosa  normal, no drainage   or sinus tenderness  Throat:   Lips, mucosa, and tongue normal; teeth and gums normal  Neck:   Supple, symmetrical, trachea midline, no adenopathy;       thyroid:  No enlargement/tenderness/nodules  Back:     Symmetric, no curvature, ROM normal, no CVA tenderness  Lungs:     Clear to auscultation bilaterally, respirations unlabored  Chest wall:    No tenderness or deformity  Heart:    Regular rate and rhythm, S1 and S2 normal, no murmur, rub   or gallop  Abdomen:     Soft, non-tender, bowel sounds active all four quadrants,    no masses, no organomegaly  Genitalia:    Deferred to urology  Rectal:    Extremities:   Extremities normal, atraumatic, no cyanosis or edema  Pulses:   2+ and symmetric all extremities  Skin:   Skin color, texture, turgor normal, no rashes or lesions  Lymph nodes:   Cervical, supraclavicular, and axillary nodes normal  Neurologic:   CNII-XII intact. Decreased strength, normal sensation and reflexes throughout.  Shuffling gait          Assessment & Plan:

## 2014-04-26 NOTE — Assessment & Plan Note (Signed)
Chronic problem.  Following w/ Dr Carles Collet.  Pt reports his meds need to be adjusted.  Has appt upcoming.  Will follow.

## 2014-04-26 NOTE — Assessment & Plan Note (Signed)
Chronic problem.  Tolerating statin w/o difficulty.  Check labs.  Adjust meds prn  

## 2014-04-26 NOTE — Telephone Encounter (Signed)
Med filled.  

## 2014-04-26 NOTE — Telephone Encounter (Signed)
Levodopa IR refill requested. Per last office note- patient to remain on medication. Refill approved and sent to patient's pharmacy.

## 2014-04-27 ENCOUNTER — Telehealth: Payer: Self-pay | Admitting: Family Medicine

## 2014-04-27 NOTE — Telephone Encounter (Signed)
emmi emailed °

## 2014-04-30 ENCOUNTER — Ambulatory Visit (INDEPENDENT_AMBULATORY_CARE_PROVIDER_SITE_OTHER): Payer: Medicare Other | Admitting: Neurology

## 2014-04-30 ENCOUNTER — Encounter: Payer: Self-pay | Admitting: Neurology

## 2014-04-30 VITALS — BP 144/60 | HR 88 | Ht 70.0 in | Wt 193.0 lb

## 2014-04-30 DIAGNOSIS — G20 Parkinson's disease: Secondary | ICD-10-CM

## 2014-04-30 DIAGNOSIS — F330 Major depressive disorder, recurrent, mild: Secondary | ICD-10-CM

## 2014-04-30 NOTE — Progress Notes (Signed)
[name] was seen today in the movement disorders clinic for neurologic consultation at the request of Annye Asa, MD.  The consultation is for the evaluation of PD.  The pt is accompanied by his wife who supplements the hx.  The patient was previously seen by [name] and I did have the opportunity to review those records.  Patient is wishing to transfer care because of the length of travel to [facility].  The first symptom(s) the patient noticed was unilateral hand tremor of the L hand and this was approximately 2005.  Pt is L hand dominant.   Pt went to his PCP and he was referred to [facility] and was dx with PD.  I do not have those records. Pt believes that he was started on requip but doesn't think that it helped.  He thinks that he was on it for about a year and his wife believes that it caused sleep attacks.  He is not sure what the next medication was but the physician at [facility] left and he was then referred to [name] and he has been seeing him and his NP since.  The patient is currently on carbidopa/levodopa 50/200 CR 1-1/2 tablets at 8 AM/ and then one at noon/4pm/8pm along with a carbidopa/levodopa 25/100 in the afternoon and in the evening.  He admits that he often misses dosages.  He is on amantadine 100 mg 4 times a day and selegiline 5 mg twice a day.  01/02/14 update:  The patient returns today for followup.  Last visit, I discontinued the patient's extended release levodopa as he was on a strange combination of immediate release and extended release carbidopa/levodopa.  He is currently on carbidopa/levodopa 25/100 IR and takes 2 tablets at 8 AM/noon/4 PM/8 PM and then carbidopa/levodopa 50/200 at night.  Pt states that the first week after we changed, he felt very sleepy but he no longer feels that way.  I asked him to move his selegiline to 8 AM and noon because of reported insomnia.  The patient did have a modified barium swallow test done since last  visit.  That was normal.  Since our last visit, the patient did have a bladder neoplasm removed on 12/19/2013 and a stent was placed.  The patient has recovered well.  He still has quite a bit of hematuria though.  He does need the stent removed.  No falls.  Limited exercise.  Was referred to neurorehab since last visit but states that he chose not to go.  Going to see mental health counselor at [facility] and that is helping.  Remains on amantadine.  04/30/14 update:   He is currently on carbidopa/levodopa 25/100 IR and takes 2 tablets at 8 AM/noon/4 PM/8 PM and then carbidopa/levodopa 50/200 at night. He is still on selegeline 5 mg bid and amantadine 100 mg.  States that often times, he is taking the selegeline too late and then has trouble sleeping.  He on Lexapro for depression.  He has not had any falls since last visit.  Not formally exercising; "I know that it needs to be done but I cannot seem to get it in the routine."  No hallucinations.  Finds that he is sleeping a bit more during the day.  Did start vesicare and that may have affected balance.  It may have helped the bladder, however.      PREVIOUS MEDICATIONS: Sinemet, Sinemet CR, Requip and Eldpryl Requip (sleep attacks)  ALLERGIES:  No Known Allergies  CURRENT MEDICATIONS:  Current Outpatient Prescriptions on File Prior to Visit  Medication Sig Dispense Refill  . ALPHA LIPOIC ACID PO Take 1 capsule by mouth daily.    Marland Kitchen amantadine (SYMMETREL) 100 MG capsule Take 1 capsule (100 mg total) by mouth 4 (four) times daily. 120 capsule 5  . aspirin 81 MG tablet Take 162 mg by mouth daily.     Marland Kitchen atorvastatin (LIPITOR) 40 MG tablet TAKE ONE TABLET BY MOUTH ONE TIME DAILY  90 tablet 1  . Ca Carbonate-Mag Hydroxide (ROLAIDS PO) Take by mouth as needed.    . carbidopa-levodopa (SINEMET CR) 50-200 MG per tablet Take 1 tablet by mouth at bedtime. 30 tablet 5  . carbidopa-levodopa (SINEMET IR) 25-100 MG per tablet TAKE TWO TABLETS BY MOUTH  FOUR TIMES DAILY  240 tablet 4  . celecoxib (CELEBREX) 200 MG capsule TAKE ONE CAPSULE BY MOUTH ONE TIME DAILY  30 capsule 4  . diclofenac sodium (VOLTAREN) 1 % GEL Apply topically as needed.    Marland Kitchen escitalopram (LEXAPRO) 10 MG tablet Take 10 mg by mouth every evening.    . fexofenadine (ALLEGRA) 180 MG tablet Take 180 mg by mouth daily.     . fluorouracil (EFUDEX) 5 % cream Apply topically 2 (two) times daily.    . fluticasone (FLONASE) 50 MCG/ACT nasal spray Use two sprays in each nostril daily 16 g 4  . ibuprofen (ADVIL,MOTRIN) 200 MG tablet Take 200 mg by mouth every 6 (six) hours as needed.    Marland Kitchen losartan (COZAAR) 100 MG tablet TAKE ONE TABLET BY MOUTH ONE TIME DAILY  30 tablet 6  . oxyCODONE-acetaminophen (PERCOCET) 5-325 MG per tablet Take 1 tablet by mouth every 4 (four) hours as needed. For back pain    . polyethylene glycol (MIRALAX / GLYCOLAX) packet Take 17 g by mouth daily as needed. For laxative    . Probiotic Product (PROBIOTIC DAILY PO) Take 1 capsule by mouth daily.    . selegiline (ELDEPRYL) 5 MG capsule Take 1 capsule (5 mg total) by mouth 2 (two) times daily. 60 capsule 5  . solifenacin (VESICARE) 5 MG tablet Take 5 mg by mouth daily.    . tamsulosin (FLOMAX) 0.4 MG CAPS capsule Take 0.4 mg by mouth daily.    . TURMERIC PO Take 1 capsule by mouth daily.     No current facility-administered medications on file prior to visit.    PAST MEDICAL HISTORY:   Past Medical History  Diagnosis Date  . Hyperlipidemia   . Hypertension   . Parkinson disease DX   2005    NEUROLOGIST-   DR TAT--  IDIOPATHIC PARKINSON'S /  TREMORS CONTROLLED WITH MEDS  . GERD (gastroesophageal reflux disease)   . Allergic rhinitis   . OAB (overactive bladder)   . Frequency of urination   . Urge urinary incontinence   . Nocturia   . BPH with elevated PSA   . Osteoarthritis     KNEES, SHOULDER  . DDD (degenerative disc disease), lumbar   . Hearing loss     does not wear his hearing aid  . Mild  obstructive sleep apnea     PER PT  MILD OSA , NO CPAP RX,  STUDY DONE 2009  . Depression     PAST SURGICAL HISTORY:   Past Surgical History  Procedure Laterality Date  . Nasal sinus surgery  1982  . Inguinal hernia repair  03/09/2012    Procedure: HERNIA REPAIR INGUINAL  ADULT;  Surgeon: Adin Hector, MD;  Location: [facility];  Service: General;  Laterality: Right;  . Insertion of mesh  03/09/2012    Procedure: INSERTION OF MESH;  Surgeon: Adin Hector, MD;  Location: [facility];  Service: General;  Laterality: Right;  . Removal benign cyst left forehead  1969  . Orif right arm fx  1949    HARDWARE REMOVED   . Cataract extraction w/ intraocular lens  implant, bilateral  2014  . Cystoscopy with stent placement Left 12/19/2013    Procedure: CYSTOSCOPY BLADDER BX, LEFT URETERAL STENT PLACEMENT , LEFT RETROGRADE AND PYLOGRAM;  Surgeon: Festus Aloe, MD;  Location: [facility];  Service: Urology;  Laterality: Left;  . Transurethral resection of bladder tumor N/A 12/19/2013    Procedure:  TRANSURETHRAL RESECTION OF BLADDER TUMOR WITH GYRUS (TURBT-GYRUS);  Surgeon: Festus Aloe, MD;  Location: [facility];  Service: Urology;  Laterality: N/A;    SOCIAL HISTORY:   History   Social History  . Marital Status: Married    Spouse Name: N/A    Number of Children: N/A  . Years of Education: N/A   Occupational History  . Not on file.   Social History Main Topics  . Smoking status: Former Smoker -- 1.00 packs/day for 15 years    Types: Cigarettes    Quit date: 04/20/1968  . Smokeless tobacco: Never Used  . Alcohol Use: 4.2 oz/week    7 Glasses of wine per week     Comment: ONE WINE PER DAY  . Drug Use: No  . Sexual Activity: Not on file   Other Topics Concern  . Not on file   Social History Narrative    FAMILY HISTORY:   Family Status  Relation Status Death Age  . Mother Deceased     alzeihmer's disease  . Father Deceased     liver cancer,  colon cancer  . Brother Alive   . Brother Deceased     killed   . Daughter Alive     ROS:  A complete 10 system review of systems was obtained and was unremarkable apart from what is mentioned above.  PHYSICAL EXAMINATION:    VITALS:   Filed Vitals:   04/30/14 1125  BP: 144/60  Pulse: 88  Height: 5\' 10"  (1.778 m)  Weight: 193 lb (87.544 kg)   Wt Readings from Last 3 Encounters:  04/30/14 193 lb (87.544 kg)  04/26/14 192 lb 4 oz (87.204 kg)  01/02/14 186 lb (84.369 kg)    GEN:  The patient appears stated age and is in NAD. HEENT:  Normocephalic, atraumatic.  The mucous membranes are moist. The superficial temporal arteries are without ropiness or tenderness. CV:  RRR Lungs:  CTAB Neck/HEME:  There are no carotid bruits bilaterally.  Neurological examination:  Orientation: The patient is alert and oriented x3. Fund of knowledge is appropriate.  Recent and remote memory are intact.  Attention and concentration are normal.    Able to name objects and repeat phrases. Cranial nerves: There is good facial symmetry.  There is mild facial hypomimia.   Extraocular muscles are intact. The visual fields are full to confrontational testing. The speech is fluent and clear.  The patient has some difficulty with the guttural sounds.  He is mildly hypophonic.  Soft palate rises symmetrically and there is no tongue deviation. Hearing is intact to conversational tone. Sensation: Sensation is intact to light touch throughout. Motor: Strength is 5/5 in the  bilateral upper and lower extremities.   Shoulder shrug is equal and symmetric.  There is no pronator drift.   Movement examination: Tone: There is normal tone in the bilateral upper extremities.  The tone in the lower extremities is normal.  Abnormal movements: There is minor tremor in the LUE.  No dyskinesia today. Coordination:  There is no signifcant decremation with RAM's Gait and Station: The patient has no significant difficulty  arising out of a deep-seated chair without the use of the hands. The patient's stride length is slightly decreased.  There is slight increase in tremor in the L hand with ambulation.   There is mild camptocormia to the L  ASSESSMENT/PLAN:  1.  Idiopathic Parkinson's disease  -The patient looks better today now that he is off of the combination of CR and IR and solely on carbidopa/levodopa immediate release treatment daytime.  He is taking 25/100 IR 2 tablets at 8 AM/noon/4 PM/8 PM and then does take the 50/200 CR at bedtime.  -For now, the patient will remain on selegiline at 8 AM and noon. Reminded not to take selegeline after noon or will cause insomnia and has noted that.   He has been on amantadine for a long time, presumably for tremor.  I explained to him that this medication also helps dyskinesia.  He denies any dyskinesia ever, although he had mild dyskinesia the first visit I saw him.  He will decrease amantadine from qid to tid (drop the last dosage of the day.  -I. encouraged again safe, cardiovascular exercise.  Greater than 50% of the 45 minute visit in counseling regarding safety as well as what to expect now as well as in the future. 2.  Dysphagia.  -MBE on 09/29/13 was normal 3.  Depression.  -He is on Lexapro.  I think that cardiovascular exercise would greatly help.  He is seeing a Licensed conveyancer and is actually doing better. 4.  mild cognitive impairment.  -This is likely related to the Parkinson's, but I also think that depression contributes to memory change.  I do not see dementia at this point. 5.  Urinary incontinence  -started on vesicare.  Thinks may have affected the bladder and will need to monitor.  Can make balance with PD worse.   6.  I will plan on seeing him back in the next few months, sooner should new neurologic issues arise.  Given invite to the March PD event and info on the [employer] challenge.

## 2014-04-30 NOTE — Patient Instructions (Addendum)
1.  Stop the last dose of the day of the amantadine (so you will only be taking it three times a day) 2.  Make sure that your last dose of selegeline is not after noon or it can cause insomnia 3.  Look up the [place] challenge in [place] online.  They raise local money for Parkinsons disease

## 2014-05-31 ENCOUNTER — Telehealth: Payer: Self-pay | Admitting: *Deleted

## 2014-05-31 NOTE — Telephone Encounter (Signed)
Prior authorization for celecoxib initiated. Awaiting determination. JG//CMA

## 2014-06-02 ENCOUNTER — Other Ambulatory Visit: Payer: Self-pay | Admitting: Family Medicine

## 2014-06-04 ENCOUNTER — Other Ambulatory Visit: Payer: Self-pay | Admitting: Neurology

## 2014-06-04 ENCOUNTER — Other Ambulatory Visit: Payer: Self-pay | Admitting: General Practice

## 2014-06-04 MED ORDER — SELEGILINE HCL 5 MG PO TABS: 5.0000 mg | ORAL_TABLET | Freq: Two times a day (BID) | ORAL | Status: DC

## 2014-06-04 MED ORDER — ESCITALOPRAM OXALATE 10 MG PO TABS
10.0000 mg | ORAL_TABLET | Freq: Every evening | ORAL | Status: DC
Start: 2014-06-04 — End: 2014-10-04

## 2014-06-04 NOTE — Telephone Encounter (Signed)
Insurance denied Selegiline capsules - RX sent for tablets to [facility].

## 2014-06-04 NOTE — Telephone Encounter (Signed)
Med filled.  

## 2014-06-11 NOTE — Telephone Encounter (Signed)
PA approved effective through 06/01/2015. JG//CMA

## 2014-06-14 ENCOUNTER — Encounter (HOSPITAL_BASED_OUTPATIENT_CLINIC_OR_DEPARTMENT_OTHER): Payer: Self-pay | Admitting: *Deleted

## 2014-06-14 ENCOUNTER — Emergency Department (HOSPITAL_BASED_OUTPATIENT_CLINIC_OR_DEPARTMENT_OTHER)
Admission: EM | Admit: 2014-06-14 | Discharge: 2014-06-14 | Disposition: A | Discharge status: Home / Self Care | Payer: Medicare Other | Attending: Emergency Medicine | Admitting: Emergency Medicine

## 2014-06-14 DIAGNOSIS — G20 Parkinson's disease: Secondary | ICD-10-CM | POA: Insufficient documentation

## 2014-06-14 DIAGNOSIS — Z7951 Long term (current) use of inhaled steroids: Secondary | ICD-10-CM | POA: Insufficient documentation

## 2014-06-14 DIAGNOSIS — Z791 Long term (current) use of non-steroidal anti-inflammatories (NSAID): Secondary | ICD-10-CM | POA: Insufficient documentation

## 2014-06-14 DIAGNOSIS — Z79899 Other long term (current) drug therapy: Secondary | ICD-10-CM | POA: Insufficient documentation

## 2014-06-14 DIAGNOSIS — W228XXA Striking against or struck by other objects, initial encounter: Secondary | ICD-10-CM | POA: Insufficient documentation

## 2014-06-14 DIAGNOSIS — N400 Enlarged prostate without lower urinary tract symptoms: Secondary | ICD-10-CM | POA: Diagnosis not present

## 2014-06-14 DIAGNOSIS — Z87891 Personal history of nicotine dependence: Secondary | ICD-10-CM | POA: Insufficient documentation

## 2014-06-14 DIAGNOSIS — S0181XA Laceration without foreign body of other part of head, initial encounter: Secondary | ICD-10-CM | POA: Insufficient documentation

## 2014-06-14 DIAGNOSIS — Z8709 Personal history of other diseases of the respiratory system: Secondary | ICD-10-CM | POA: Insufficient documentation

## 2014-06-14 DIAGNOSIS — Y9289 Other specified places as the place of occurrence of the external cause: Secondary | ICD-10-CM | POA: Diagnosis not present

## 2014-06-14 DIAGNOSIS — Z7982 Long term (current) use of aspirin: Secondary | ICD-10-CM | POA: Insufficient documentation

## 2014-06-14 DIAGNOSIS — H9190 Unspecified hearing loss, unspecified ear: Secondary | ICD-10-CM | POA: Insufficient documentation

## 2014-06-14 DIAGNOSIS — E785 Hyperlipidemia, unspecified: Secondary | ICD-10-CM | POA: Insufficient documentation

## 2014-06-14 DIAGNOSIS — I10 Essential (primary) hypertension: Secondary | ICD-10-CM | POA: Diagnosis not present

## 2014-06-14 DIAGNOSIS — M1990 Unspecified osteoarthritis, unspecified site: Secondary | ICD-10-CM | POA: Insufficient documentation

## 2014-06-14 DIAGNOSIS — F329 Major depressive disorder, single episode, unspecified: Secondary | ICD-10-CM | POA: Insufficient documentation

## 2014-06-14 DIAGNOSIS — Z8669 Personal history of other diseases of the nervous system and sense organs: Secondary | ICD-10-CM | POA: Diagnosis not present

## 2014-06-14 DIAGNOSIS — Y998 Other external cause status: Secondary | ICD-10-CM | POA: Insufficient documentation

## 2014-06-14 DIAGNOSIS — Y9389 Activity, other specified: Secondary | ICD-10-CM | POA: Insufficient documentation

## 2014-06-14 NOTE — Discharge Instructions (Signed)
Facial Laceration ° A facial laceration is a cut on the face. These injuries can be painful and cause bleeding. Lacerations usually heal quickly, but they need special care to reduce scarring. °DIAGNOSIS  °Your health care provider will take a medical history, ask for details about how the injury occurred, and examine the wound to determine how deep the cut is. °TREATMENT  °Some facial lacerations may not require closure. Others may not be able to be closed because of an increased risk of infection. The risk of infection and the chance for successful closure will depend on various factors, including the amount of time since the injury occurred. °The wound may be cleaned to help prevent infection. If closure is appropriate, pain medicines may be given if needed. Your health care provider will use stitches (sutures), wound glue (adhesive), or skin adhesive strips to repair the laceration. These tools bring the skin edges together to allow for faster healing and a better cosmetic outcome. If needed, you may also be given a tetanus shot. °HOME CARE INSTRUCTIONS °· Only take over-the-counter or prescription medicines as directed by your health care provider. °· Follow your health care provider's instructions for wound care. These instructions will vary depending on the technique used for closing the wound. °For Sutures: °· Keep the wound clean and dry.   °· If you were given a bandage (dressing), you should change it at least once a day. Also change the dressing if it becomes wet or dirty, or as directed by your health care provider.   °· Wash the wound with soap and water 2 times a day. Rinse the wound off with water to remove all soap. Pat the wound dry with a clean towel.   °· After cleaning, apply a thin layer of the antibiotic ointment recommended by your health care provider. This will help prevent infection and keep the dressing from sticking.   °· You may shower as usual after the first 24 hours. Do not soak the  wound in water until the sutures are removed.   °· Get your sutures removed as directed by your health care provider. With facial lacerations, sutures should usually be taken out after 4-5 days to avoid stitch marks.   °· Wait a few days after your sutures are removed before applying any makeup. °For Skin Adhesive Strips: °· Keep the wound clean and dry.   °· Do not get the skin adhesive strips wet. You may bathe carefully, using caution to keep the wound dry.   °· If the wound gets wet, pat it dry with a clean towel.   °· Skin adhesive strips will fall off on their own. You may trim the strips as the wound heals. Do not remove skin adhesive strips that are still stuck to the wound. They will fall off in time.   °For Wound Adhesive: °· You may briefly wet your wound in the shower or bath. Do not soak or scrub the wound. Do not swim. Avoid periods of heavy sweating until the skin adhesive has fallen off on its own. After showering or bathing, gently pat the wound dry with a clean towel.   °· Do not apply liquid medicine, cream medicine, ointment medicine, or makeup to your wound while the skin adhesive is in place. This may loosen the film before your wound is healed.   °· If a dressing is placed over the wound, be careful not to apply tape directly over the skin adhesive. This may cause the adhesive to be pulled off before the wound is healed.   °· Avoid   prolonged exposure to sunlight or tanning lamps while the skin adhesive is in place. °· The skin adhesive will usually remain in place for 5-10 days, then naturally fall off the skin. Do not pick at the adhesive film.   °After Healing: °Once the wound has healed, cover the wound with sunscreen during the day for 1 full year. This can help minimize scarring. Exposure to ultraviolet light in the first year will darken the scar. It can take 1-2 years for the scar to lose its redness and to heal completely.  °SEEK IMMEDIATE MEDICAL CARE IF: °· You have redness, pain, or  swelling around the wound.   °· You see a yellowish-white fluid (pus) coming from the wound.   °· You have chills or a fever.   °MAKE SURE YOU: °· Understand these instructions. °· Will watch your condition. °· Will get help right away if you are not doing well or get worse. °Document Released: 05/14/2004 Document Revised: 01/25/2013 Document Reviewed: 11/17/2012 °ExitCare® Patient Information ©2015 [employer]. This information is not intended to replace advice given to you by your health care provider. Make sure you discuss any questions you have with your health care provider. °Head Injury °You have received a head injury. It does not appear serious at this time. Headaches and vomiting are common following head injury. It should be easy to awaken from sleeping. Sometimes it is necessary for you to stay in the emergency department for a while for observation. Sometimes admission to the hospital may be needed. After injuries such as yours, most problems occur within the first 24 hours, but side effects may occur up to 7-10 days after the injury. It is important for you to carefully monitor your condition and contact your health care provider or seek immediate medical care if there is a change in your condition. °WHAT ARE THE TYPES OF HEAD INJURIES? °Head injuries can be as minor as a bump. Some head injuries can be more severe. More severe head injuries include: °· A jarring injury to the brain (concussion). °· A bruise of the brain (contusion). This mean there is bleeding in the brain that can cause swelling. °· A cracked skull (skull fracture). °· Bleeding in the brain that collects, clots, and forms a bump (hematoma). °WHAT CAUSES A HEAD INJURY? °A serious head injury is most likely to happen to someone who is in a car wreck and is not wearing a seat belt. Other causes of major head injuries include bicycle or motorcycle accidents, sports injuries, and falls. °HOW ARE HEAD INJURIES DIAGNOSED? °A complete  history of the event leading to the injury and your current symptoms will be helpful in diagnosing head injuries. Many times, pictures of the brain, such as CT or MRI are needed to see the extent of the injury. Often, an overnight hospital stay is necessary for observation.  °WHEN SHOULD I SEEK IMMEDIATE MEDICAL CARE?  °You should get help right away if: °· You have confusion or drowsiness. °· You feel sick to your stomach (nauseous) or have continued, forceful vomiting. °· You have dizziness or unsteadiness that is getting worse. °· You have severe, continued headaches not relieved by medicine. Only take over-the-counter or prescription medicines for pain, fever, or discomfort as directed by your health care provider. °· You do not have normal function of the arms or legs or are unable to walk. °· You notice changes in the black spots in the center of the colored part of your eye (pupil). °· You have a clear or   bloody fluid coming from your nose or ears. °· You have a loss of vision. °During the next 24 hours after the injury, you must stay with someone who can watch you for the warning signs. This person should contact local emergency services (911 in the [place] if you have seizures, you become unconscious, or you are unable to wake up. °HOW CAN I PREVENT A HEAD INJURY IN THE FUTURE? °The most important factor for preventing major head injuries is avoiding motor vehicle accidents.  To minimize the potential for damage to your head, it is crucial to wear seat belts while riding in motor vehicles. Wearing helmets while bike riding and playing collision sports (like football) is also helpful. Also, avoiding dangerous activities around the house will further help reduce your risk of head injury.  °WHEN CAN I RETURN TO NORMAL ACTIVITIES AND ATHLETICS? °You should be reevaluated by your health care provider before returning to these activities. If you have any of the following symptoms, you should not return to  activities or contact sports until 1 week after the symptoms have stopped: °· Persistent headache. °· Dizziness or vertigo. °· Poor attention and concentration. °· Confusion. °· Memory problems. °· Nausea or vomiting. °· Fatigue or tire easily. °· Irritability. °· Intolerant of bright lights or loud noises. °· Anxiety or depression. °· Disturbed sleep. °MAKE SURE YOU:  °· Understand these instructions. °· Will watch your condition. °· Will get help right away if you are not doing well or get worse. °Document Released: 04/06/2005 Document Revised: 04/11/2013 Document Reviewed: 12/12/2012 °ExitCare® Patient Information ©2015 [employer]. This information is not intended to replace advice given to you by your health care provider. Make sure you discuss any questions you have with your health care provider. ° °

## 2014-06-14 NOTE — ED Provider Notes (Signed)
CSN: [id number]     Arrival date & time 06/14/14  1700 History   First MD Initiated Contact with Patient 06/14/14 1710     Chief Complaint  Patient presents with  . Facial Laceration     (Consider location/radiation/quality/duration/timing/severity/associated sxs/prior Treatment) HPI Comments: 81 year old male presenting with his wife with a laceration to the left side of his forehead occurring shortly prior to arrival. Patient reports he accidentally banged his head on the car door. Denies loss of consciousness. Denies headache, dizziness, vision change, nausea, vomiting. He takes two 81 mg aspirin daily.  The history is provided by the patient and the spouse.    Past Medical History  Diagnosis Date  . Hyperlipidemia   . Hypertension   . Parkinson disease DX   2005    NEUROLOGIST-   DR TAT--  IDIOPATHIC PARKINSON'S /  TREMORS CONTROLLED WITH MEDS  . GERD (gastroesophageal reflux disease)   . Allergic rhinitis   . OAB (overactive bladder)   . Frequency of urination   . Urge urinary incontinence   . Nocturia   . BPH with elevated PSA   . Osteoarthritis     KNEES, SHOULDER  . DDD (degenerative disc disease), lumbar   . Hearing loss     does not wear his hearing aid  . Mild obstructive sleep apnea     PER PT  MILD OSA , NO CPAP RX,  STUDY DONE 2009  . Depression    Past Surgical History  Procedure Laterality Date  . Nasal sinus surgery  1982  . Inguinal hernia repair  03/09/2012    Procedure: HERNIA REPAIR INGUINAL ADULT;  Surgeon: Adin Hector, MD;  Location: [facility];  Service: General;  Laterality: Right;  . Insertion of mesh  03/09/2012    Procedure: INSERTION OF MESH;  Surgeon: Adin Hector, MD;  Location: [facility];  Service: General;  Laterality: Right;  . Removal benign cyst left forehead  1969  . Orif right arm fx  1949    HARDWARE REMOVED   . Cataract extraction w/ intraocular lens  implant, bilateral  2014  . Cystoscopy with stent placement Left 12/19/2013   Procedure: CYSTOSCOPY BLADDER BX, LEFT URETERAL STENT PLACEMENT , LEFT RETROGRADE AND PYLOGRAM;  Surgeon: Festus Aloe, MD;  Location: [facility];  Service: Urology;  Laterality: Left;  . Transurethral resection of bladder tumor N/A 12/19/2013    Procedure:  TRANSURETHRAL RESECTION OF BLADDER TUMOR WITH GYRUS (TURBT-GYRUS);  Surgeon: Festus Aloe, MD;  Location: [facility];  Service: Urology;  Laterality: N/A;   Family History  Problem Relation Age of Onset  . Alzheimer's disease Mother   . Liver cancer Father   . Cancer Father     colon   History  Substance Use Topics  . Smoking status: Former Smoker -- 1.00 packs/day for 15 years    Types: Cigarettes    Quit date: 04/20/1968  . Smokeless tobacco: Never Used  . Alcohol Use: 4.2 oz/week    7 Glasses of wine per week     Comment: ONE WINE PER DAY    Review of Systems  Skin: Positive for wound.  All other systems reviewed and are negative.     Allergies  Review of patient's allergies indicates no known allergies.  Home Medications   Prior to Admission medications   Medication Sig Start Date End Date Taking? Authorizing Provider  ALPHA LIPOIC ACID PO Take 1 capsule by mouth daily.    Historical  Provider, MD  amantadine (SYMMETREL) 100 MG capsule Take 1 capsule (100 mg total) by mouth 4 (four) times daily. 03/23/14   Eustace Quail Tat, DO  aspirin 81 MG tablet Take 162 mg by mouth daily.     Historical Provider, MD  atorvastatin (LIPITOR) 40 MG tablet TAKE ONE TABLET BY MOUTH ONE TIME DAILY  04/26/14   Midge Minium, MD  Ca Carbonate-Mag Hydroxide (ROLAIDS PO) Take by mouth as needed.    Historical Provider, MD  carbidopa-levodopa (SINEMET CR) 50-200 MG per tablet Take 1 tablet by mouth at bedtime. 11/06/13   Eustace Quail Tat, DO  carbidopa-levodopa (SINEMET IR) 25-100 MG per tablet TAKE TWO TABLETS BY MOUTH FOUR TIMES DAILY  04/26/14   Eustace Quail Tat, DO  celecoxib (CELEBREX) 200 MG capsule TAKE  ONE CAPSULE BY MOUTH ONE TIME DAILY  06/04/14   Midge Minium, MD  diclofenac sodium (VOLTAREN) 1 % GEL Apply topically as needed.    Historical Provider, MD  escitalopram (LEXAPRO) 10 MG tablet Take 1 tablet (10 mg total) by mouth every evening. 06/04/14   Midge Minium, MD  fexofenadine (ALLEGRA) 180 MG tablet Take 180 mg by mouth daily.     Historical Provider, MD  fluorouracil (EFUDEX) 5 % cream Apply topically 2 (two) times daily.    Historical Provider, MD  fluticasone [facility]) 50 MCG/ACT nasal spray USE TWO SPRAYS IN [facility] NOSTRIL DAILY  06/04/14   Midge Minium, MD  ibuprofen (ADVIL,MOTRIN) 200 MG tablet Take 200 mg by mouth every 6 (six) hours as needed.    Historical Provider, MD  losartan (COZAAR) 100 MG tablet TAKE ONE TABLET BY MOUTH ONE TIME DAILY  04/26/14   Midge Minium, MD  oxyCODONE-acetaminophen (PERCOCET) 5-325 MG per tablet Take 1 tablet by mouth every 4 (four) hours as needed. For back pain    Historical Provider, MD  polyethylene glycol (MIRALAX / GLYCOLAX) packet Take 17 g by mouth daily as needed. For laxative    Historical Provider, MD  Probiotic Product (PROBIOTIC DAILY PO) Take 1 capsule by mouth daily.    Historical Provider, MD  selegiline (ELDEPRYL) 5 MG tablet Take 1 tablet (5 mg total) by mouth 2 (two) times daily with a meal. Last dose no later than 2 pm 06/04/14   Eustace Quail Tat, DO  solifenacin (VESICARE) 5 MG tablet Take 5 mg by mouth daily.    Historical Provider, MD  tamsulosin (FLOMAX) 0.4 MG CAPS capsule Take 0.4 mg by mouth daily.    Historical Provider, MD  TURMERIC PO Take 1 capsule by mouth daily.    Historical Provider, MD   BP 153/75 mmHg  Pulse 85  Temp(Src) 97.5 F (36.4 C) (Oral)  Resp 18  Ht 5' 10.5" (1.791 m)  Wt 186 lb (84.369 kg)  BMI 26.30 kg/m2  SpO2 99% Physical Exam  Constitutional: He is oriented to person, place, and time. He appears well-developed and well-nourished. No distress.  HENT:  Head: Normocephalic and  atraumatic.    Mouth/Throat: Oropharynx is clear and moist.  Eyes: Conjunctivae and EOM are normal. Pupils are equal, round, and reactive to light.  Neck: Normal range of motion. Neck supple. No spinous process tenderness and no muscular tenderness present.  Cardiovascular: Normal rate, regular rhythm and normal heart sounds.   Pulmonary/Chest: Effort normal and breath sounds normal.  Musculoskeletal: Normal range of motion. He exhibits no edema.  Neurological: He is alert and oriented to person, place, and time. He has  normal strength. No cranial nerve deficit or sensory deficit. He displays a negative Romberg sign. Coordination and gait normal. GCS eye subscore is 4. GCS verbal subscore is 5. GCS motor subscore is 6.  Speech fluent and goal oriented. Moves limbs without ataxia. Equal grip strength bilaterally.  Skin: Skin is warm and dry. No rash noted. He is not diaphoretic.  Psychiatric: He has a normal mood and affect. His behavior is normal.  Nursing note and vitals reviewed.   ED Course  Procedures (including critical care time)  LACERATION REPAIR Performed by: Lucien Mons Authorized by: Lucien Mons Consent: Verbal consent obtained. Risks and benefits: risks, benefits and alternatives were discussed Consent given by: patient Patient identity confirmed: provided demographic data Prepped and Draped in normal sterile fashion Wound explored Laceration Location: left side of forehead Laceration Length: 3 cm No Foreign Bodies seen or palpated Anesthesia: none Irrigation method: syringe Amount of cleaning: standard Skin closure: dermabond  Patient tolerance: Patient tolerated the procedure well with no immediate complications.  Labs Review Labs Reviewed - No data to display  Imaging Review No results found.   EKG Interpretation None      MDM   Final diagnoses:  Forehead laceration, initial encounter   NAD. No focal neurologic deficits. No concussive symptoms.  No loss of consciousness. Wound care given. Laceration closed with Dermabond. Stable for discharge. Return precautions given. Patient states understanding of treatment care plan and is agreeable.  Discussed with attending Dr. Canary Brim who also evaluated patient and agrees with plan of care.   Carman Ching, PA-C 06/14/14 [place], MD 06/14/14 [phone]

## 2014-06-14 NOTE — ED Notes (Signed)
Laceration above his left eye on the corner of the car door. No LOC.

## 2014-07-30 ENCOUNTER — Ambulatory Visit (INDEPENDENT_AMBULATORY_CARE_PROVIDER_SITE_OTHER): Payer: Medicare Other | Admitting: Neurology

## 2014-07-30 ENCOUNTER — Encounter: Payer: Self-pay | Admitting: Neurology

## 2014-07-30 VITALS — BP 158/76 | HR 80 | Ht 70.5 in | Wt 190.0 lb

## 2014-07-30 DIAGNOSIS — R208 Other disturbances of skin sensation: Secondary | ICD-10-CM | POA: Diagnosis not present

## 2014-07-30 DIAGNOSIS — G3184 Mild cognitive impairment, so stated: Secondary | ICD-10-CM

## 2014-07-30 DIAGNOSIS — G20 Parkinson's disease: Secondary | ICD-10-CM

## 2014-07-30 DIAGNOSIS — G20A1 Parkinson's disease without dyskinesia, without mention of fluctuations: Secondary | ICD-10-CM

## 2014-07-30 DIAGNOSIS — R209 Unspecified disturbances of skin sensation: Secondary | ICD-10-CM

## 2014-07-30 DIAGNOSIS — F330 Major depressive disorder, recurrent, mild: Secondary | ICD-10-CM

## 2014-07-30 NOTE — Progress Notes (Signed)
[name] was seen today in the movement disorders clinic for neurologic consultation at the request of Annye Asa, MD.  The consultation is for the evaluation of PD.  The pt is accompanied by his wife who supplements the hx.  The patient was previously seen by [name] and I did have the opportunity to review those records.  Patient is wishing to transfer care because of the length of travel to [facility].  The first symptom(s) the patient noticed was unilateral hand tremor of the L hand and this was approximately 2005.  Pt is L hand dominant.   Pt went to his PCP and he was referred to [facility] and was dx with PD.  I do not have those records. Pt believes that he was started on requip but doesn't think that it helped.  He thinks that he was on it for about a year and his wife believes that it caused sleep attacks.  He is not sure what the next medication was but the physician at [place] [facility] left and he was then referred to [name] and he has been seeing him and his NP since.  The patient is currently on carbidopa/levodopa 50/200 CR 1-1/2 tablets at 8 AM/ and then one at noon/4pm/8pm along with a carbidopa/levodopa 25/100 in the afternoon and in the evening.  He admits that he often misses dosages.  He is on amantadine 100 mg 4 times a day and selegiline 5 mg twice a day.  01/02/14 update:  The patient returns today for followup.  Last visit, I discontinued the patient's extended release levodopa as he was on a strange combination of immediate release and extended release carbidopa/levodopa.  He is currently on carbidopa/levodopa 25/100 IR and takes 2 tablets at 8 AM/noon/4 PM/8 PM and then carbidopa/levodopa 50/200 at night.  Pt states that the first week after we changed, he felt very sleepy but he no longer feels that way.  I asked him to move his selegiline to 8 AM and noon because of reported insomnia.  The patient did have a modified barium swallow test done since last  visit.  That was normal.  Since our last visit, the patient did have a bladder neoplasm removed on 12/19/2013 and a stent was placed.  The patient has recovered well.  He still has quite a bit of hematuria though.  He does need the stent removed.  No falls.  Limited exercise.  Was referred to neurorehab since last visit but states that he chose not to go.  Going to see mental health counselor at [facility] and that is helping.  Remains on amantadine.  04/30/14 update:   He is currently on carbidopa/levodopa 25/100 IR and takes 2 tablets at 8 AM/noon/4 PM/8 PM and then carbidopa/levodopa 50/200 at night. He is still on selegeline 5 mg bid and amantadine 100 mg.  States that often times, he is taking the selegeline too late and then has trouble sleeping.  He on Lexapro for depression.  He has not had any falls since last visit.  Not formally exercising; "I know that it needs to be done but I cannot seem to get it in the routine."  No hallucinations.  Finds that he is sleeping a bit more during the day.  Did start vesicare and that may have affected balance.  It may have helped the bladder, however.     07/30/14 update:  He is currently on carbidopa/levodopa 25/100 IR and takes 2 tablets  at 8 AM/noon/4 PM/8 PM and then carbidopa/levodopa 50/200 at night.  He is also on selegiline twice per day.  He takes amantadine 100 mg tid.  I reviewed records made available to me since last visit.  The patient went to the emergency room on 06/14/2014.  He accidentally hit his head against the car door (it was a windy day and the car door shut on him) and sustained a laceration above the left forehead.  There was no loss of consciousness or alteration of consciousness.  Dermabond was placed and the patient was sent home from the emergency room.  He c/o dry mouth and thinks that it is due to medication.  He signed up at the [facility] for exercise.  He just signed up Saturday afternoon.  No lightheadness/near syncope.   No hallucinations.  Feels a little "dopey" in the AM.  Once he takes his medications, he feels less cognitively dull.  However, he also thinks his medications may contribute to cognitive dulling.  He also complains of decreased feeling in the first to third fingers (thumb, pointer and middle) bilaterally.  He has difficulty with grasp and dropping objects.  He notices it most when he is trying to text and has to use a stylus for that.   PREVIOUS MEDICATIONS: Sinemet, Sinemet CR, Requip and Eldpryl Requip (sleep attacks)  ALLERGIES:  No Known Allergies  CURRENT MEDICATIONS:  Current Outpatient Prescriptions on File Prior to Visit  Medication Sig Dispense Refill  . amantadine (SYMMETREL) 100 MG capsule Take 1 capsule (100 mg total) by mouth 4 (four) times daily. 120 capsule 5  . aspirin 81 MG tablet Take 162 mg by mouth daily.     Marland Kitchen atorvastatin (LIPITOR) 40 MG tablet TAKE ONE TABLET BY MOUTH ONE TIME DAILY  90 tablet 1  . Ca Carbonate-Mag Hydroxide (ROLAIDS PO) Take by mouth as needed.    . carbidopa-levodopa (SINEMET CR) 50-200 MG per tablet Take 1 tablet by mouth at bedtime. 30 tablet 5  . carbidopa-levodopa (SINEMET IR) 25-100 MG per tablet TAKE TWO TABLETS BY MOUTH FOUR TIMES DAILY  240 tablet 4  . celecoxib (CELEBREX) 200 MG capsule TAKE ONE CAPSULE BY MOUTH ONE TIME DAILY  30 capsule 3  . diclofenac sodium (VOLTAREN) 1 % GEL Apply topically as needed.    Marland Kitchen escitalopram (LEXAPRO) 10 MG tablet Take 1 tablet (10 mg total) by mouth every evening. 30 tablet 3  . fexofenadine (ALLEGRA) 180 MG tablet Take 180 mg by mouth daily.     . fluorouracil (EFUDEX) 5 % cream Apply topically 2 (two) times daily.    . fluticasone (FLONASE) 50 MCG/ACT nasal spray USE TWO SPRAYS IN EACH NOSTRIL DAILY  16 g 3  . ibuprofen (ADVIL,MOTRIN) 200 MG tablet Take 200 mg by mouth every 6 (six) hours as needed.    Marland Kitchen losartan (COZAAR) 100 MG tablet TAKE ONE TABLET BY MOUTH ONE TIME DAILY  30 tablet 6  .  oxyCODONE-acetaminophen (PERCOCET) 5-325 MG per tablet Take 1 tablet by mouth every 4 (four) hours as needed. For back pain    . polyethylene glycol (MIRALAX / GLYCOLAX) packet Take 17 g by mouth daily as needed. For laxative    . Probiotic Product (PROBIOTIC DAILY PO) Take 1 capsule by mouth daily.    . selegiline (ELDEPRYL) 5 MG tablet Take 1 tablet (5 mg total) by mouth 2 (two) times daily with a meal. Last dose no later than 2 pm 60 tablet 5  .  solifenacin (VESICARE) 5 MG tablet Take 5 mg by mouth daily.    . tamsulosin (FLOMAX) 0.4 MG CAPS capsule Take 0.4 mg by mouth daily.    . TURMERIC PO Take 1 capsule by mouth daily.     No current facility-administered medications on file prior to visit.    PAST MEDICAL HISTORY:   Past Medical History  Diagnosis Date  . Hyperlipidemia   . Hypertension   . Parkinson disease DX   2005    NEUROLOGIST-   DR TAT--  IDIOPATHIC PARKINSON'S /  TREMORS CONTROLLED WITH MEDS  . GERD (gastroesophageal reflux disease)   . Allergic rhinitis   . OAB (overactive bladder)   . Frequency of urination   . Urge urinary incontinence   . Nocturia   . BPH with elevated PSA   . Osteoarthritis     KNEES, SHOULDER  . DDD (degenerative disc disease), lumbar   . Hearing loss     does not wear his hearing aid  . Mild obstructive sleep apnea     PER PT  MILD OSA , NO CPAP RX,  STUDY DONE 2009  . Depression     PAST SURGICAL HISTORY:   Past Surgical History  Procedure Laterality Date  . Nasal sinus surgery  1982  . Inguinal hernia repair  03/09/2012    Procedure: HERNIA REPAIR INGUINAL ADULT;  Surgeon: Adin Hector, MD;  Location: [facility];  Service: General;  Laterality: Right;  . Insertion of mesh  03/09/2012    Procedure: INSERTION OF MESH;  Surgeon: Adin Hector, MD;  Location: [facility];  Service: General;  Laterality: Right;  . Removal benign cyst left forehead  1969  . Orif right arm fx  1949    HARDWARE REMOVED   . Cataract extraction w/  intraocular lens  implant, bilateral  2014  . Cystoscopy with stent placement Left 12/19/2013    Procedure: CYSTOSCOPY BLADDER BX, LEFT URETERAL STENT PLACEMENT , LEFT RETROGRADE AND PYLOGRAM;  Surgeon: Festus Aloe, MD;  Location[facility];  Service: Urology;  Laterality: Left;  . Transurethral resection of bladder tumor N/A 12/19/2013    Procedure:  TRANSURETHRAL RESECTION OF BLADDER TUMOR WITH GYRUS (TURBT-GYRUS);  Surgeon: Festus Aloe, MD;  Location: [facility];  Service: Urology;  Laterality: N/A;    SOCIAL HISTORY:   History   Social History  . Marital Status: Married    Spouse Name: N/A  . Number of Children: N/A  . Years of Education: N/A   Occupational History  . Not on file.   Social History Main Topics  . Smoking status: Former Smoker -- 1.00 packs/day for 15 years    Types: Cigarettes    Quit date: 04/20/1968  . Smokeless tobacco: Never Used  . Alcohol Use: 4.2 oz/week    7 Glasses of wine per week     Comment: ONE WINE PER DAY  . Drug Use: No  . Sexual Activity: Not on file   Other Topics Concern  . Not on file   Social History Narrative    FAMILY HISTORY:   Family Status  Relation Status Death Age  . Mother Deceased     alzeihmer's disease  . Father Deceased     liver cancer, colon cancer  . Brother Alive   . Brother Deceased     killed   . Daughter Alive     ROS:  A complete 10 system review of systems was obtained and was unremarkable  apart from what is mentioned above.  PHYSICAL EXAMINATION:    VITALS:   Filed Vitals:   07/30/14 1114  BP: 158/76  Pulse: 80  Height: 5' 10.5" (1.791 m)  Weight: 190 lb (86.183 kg)   Wt Readings from Last 3 Encounters:  07/30/14 190 lb (86.183 kg)  06/14/14 186 lb (84.369 kg)  04/30/14 193 lb (87.544 kg)    GEN:  The patient appears stated age and is in NAD. HEENT:  Normocephalic, atraumatic.  The mucous membranes are moist. The superficial temporal arteries are  without ropiness or tenderness. CV:  RRR Lungs:  CTAB Neck/HEME:  There are no carotid bruits bilaterally.  Neurological examination:  Orientation: The patient is alert and oriented x3. Fund of knowledge is appropriate.  Recent and remote memory are intact.  Attention and concentration are normal.    Able to name objects and repeat phrases. Cranial nerves: There is good facial symmetry.  There is mild facial hypomimia.   Extraocular muscles are intact. The visual fields are full to confrontational testing. The speech is fluent and clear.  The patient has some difficulty with the guttural sounds.  He is mildly hypophonic.  Soft palate rises symmetrically and there is no tongue deviation. Hearing is intact to conversational tone. Sensation: Sensation is intact to light touch throughout. Motor: Strength is 5/5 in the bilateral upper and lower extremities.   Shoulder shrug is equal and symmetric.  There is no pronator drift.   Movement examination: Tone: There is normal tone in the bilateral upper extremities.  The tone in the lower extremities is normal.  Abnormal movements: There is minor tremor in the LUE.  No dyskinesia today. Coordination:  There is no signifcant decremation with RAM's Gait and Station: The patient has no significant difficulty arising out of a deep-seated chair without the use of the hands. The patient's stride length is slightly decreased.  There is slight increase in tremor in the L hand with ambulation.   There is mild camptocormia to the L  ASSESSMENT/PLAN:  1.  Idiopathic Parkinson's disease  -He will continue taking carbidopa/levodopa 25/100 IR 2 tablets at 8 AM/noon/4 PM/8 PM and then take the 50/200 CR at bedtime.  -For now, the patient will remain on selegiline at 8 AM and noon.  He will remain on amantadine, 100 mg 3 times per day  -I. encouraged again safe, cardiovascular exercise.  He just got a membership at [employer].  We talked about various types of exercise.   We talked about how to slowly ramp up and avoid any injury.  Greater than 50% of the 45 minute visit in counseling regarding safety as well as what to expect now as well as in the future.  He brings in multiple questions today. 2.  Dysphagia.  -MBE on 09/29/13 was normal 3.  Depression.  -He is on Lexapro.  I think that cardiovascular exercise would greatly help.   4.  mild cognitive impairment.  -This is likely related to the Parkinson's, but I also think that depression contributes to memory change.  I do not see dementia at this point.  Talked about possible contribution of selegeline, amantadine, vesicare.  Not sure that any of these is contributing but it is possible.  5.  Urinary incontinence  -on vesicare.  Thinks may have affected the balance but no falls and doing okay.  Thinks that may have made dry mouth worse. 6.  Hand paresthesias  -May represent carpal tunnel syndrome.  Talked about  the value of an EMG.  He wants to hold on that.  He will let me know if he changes his mind. 7.  I will plan on seeing him back in the next few months, sooner should new neurologic issues arise.

## 2014-07-30 NOTE — Patient Instructions (Addendum)
1.  Exercise at the [facility] safely and have the pharmacy let me know when you need refills on medication. 2.  If you decide that you would like to schedule the EMG test for the issue you are having in your hands, please let us know.

## 2014-09-08 ENCOUNTER — Other Ambulatory Visit: Payer: Self-pay | Admitting: Neurology

## 2014-09-10 NOTE — Telephone Encounter (Signed)
Amantidine refill requested. Per last office note- patient to remain on medication. Refill approved and sent to patient's pharmacy.

## 2014-09-11 ENCOUNTER — Other Ambulatory Visit: Payer: Self-pay | Admitting: General Practice

## 2014-09-11 MED ORDER — ATORVASTATIN CALCIUM 40 MG PO TABS: 40.0000 mg | ORAL_TABLET | Freq: Every day | ORAL | Status: DC

## 2014-10-04 ENCOUNTER — Other Ambulatory Visit: Payer: Self-pay | Admitting: Family Medicine

## 2014-10-04 ENCOUNTER — Other Ambulatory Visit: Payer: Self-pay | Admitting: Neurology

## 2014-10-04 NOTE — Telephone Encounter (Signed)
Rx sent to the pharmacy by e-script.//AB/CMA 

## 2014-10-04 NOTE — Telephone Encounter (Signed)
Carbidopa Levodopa 25/100 refill requested. Per last office note- patient to remain on medication. Refill approved and sent to patient's pharmacy.   

## 2014-10-05 ENCOUNTER — Other Ambulatory Visit: Payer: Self-pay | Admitting: General Practice

## 2014-10-05 MED ORDER — CELECOXIB 200 MG PO CAPS: 200.0000 mg | ORAL_CAPSULE | Freq: Every day | ORAL | Status: DC

## 2014-10-15 ENCOUNTER — Other Ambulatory Visit: Payer: Self-pay

## 2014-10-23 ENCOUNTER — Ambulatory Visit (INDEPENDENT_AMBULATORY_CARE_PROVIDER_SITE_OTHER): Payer: Medicare Other | Admitting: Family Medicine

## 2014-10-23 ENCOUNTER — Encounter: Payer: Self-pay | Admitting: Family Medicine

## 2014-10-23 VITALS — BP 132/82 | HR 74 | Temp 98.0°F | Resp 16 | Ht 71.0 in | Wt 184.1 lb

## 2014-10-23 DIAGNOSIS — E785 Hyperlipidemia, unspecified: Secondary | ICD-10-CM | POA: Diagnosis not present

## 2014-10-23 DIAGNOSIS — F32A Depression, unspecified: Secondary | ICD-10-CM

## 2014-10-23 DIAGNOSIS — Z23 Encounter for immunization: Secondary | ICD-10-CM

## 2014-10-23 DIAGNOSIS — F329 Major depressive disorder, single episode, unspecified: Secondary | ICD-10-CM | POA: Diagnosis not present

## 2014-10-23 DIAGNOSIS — I10 Essential (primary) hypertension: Secondary | ICD-10-CM

## 2014-10-23 LAB — CBC WITH DIFFERENTIAL/PLATELET
Basophils Absolute: 0.1 10*3/uL (ref 0.0–0.1)
Basophils Relative: 0.8 % (ref 0.0–3.0)
Eosinophils Absolute: 0.5 10*3/uL (ref 0.0–0.7)
Eosinophils Relative: 7.9 % — ABNORMAL HIGH (ref 0.0–5.0)
HCT: 43.2 % (ref 39.0–52.0)
Hemoglobin: 14.2 g/dL (ref 13.0–17.0)
LYMPHS PCT: 24.6 % (ref 12.0–46.0)
Lymphs Abs: 1.6 10*3/uL (ref 0.7–4.0)
MCHC: 32.8 g/dL (ref 30.0–36.0)
MCV: 92.2 fl (ref 78.0–100.0)
Monocytes Absolute: 0.6 10*3/uL (ref 0.1–1.0)
Monocytes Relative: 9 % (ref 3.0–12.0)
NEUTROS ABS: 3.7 10*3/uL (ref 1.4–7.7)
Neutrophils Relative %: 57.7 % (ref 43.0–77.0)
Platelets: 217 10*3/uL (ref 150.0–400.0)
RBC: 4.68 Mil/uL (ref 4.22–5.81)
RDW: 15.1 % (ref 11.5–15.5)
WBC: 6.4 10*3/uL (ref 4.0–10.5)

## 2014-10-23 LAB — HEPATIC FUNCTION PANEL
ALBUMIN: 3.9 g/dL (ref 3.5–5.2)
ALT: 8 U/L (ref 0–53)
AST: 12 U/L (ref 0–37)
Alkaline Phosphatase: 101 U/L (ref 39–117)
Bilirubin, Direct: 0.4 mg/dL — ABNORMAL HIGH (ref 0.0–0.3)
Total Bilirubin: 2.4 mg/dL — ABNORMAL HIGH (ref 0.2–1.2)
Total Protein: 6.5 g/dL (ref 6.0–8.3)

## 2014-10-23 LAB — BASIC METABOLIC PANEL
BUN: 26 mg/dL — AB (ref 6–23)
CHLORIDE: 106 meq/L (ref 96–112)
CO2: 26 mEq/L (ref 19–32)
CREATININE: 1.03 mg/dL (ref 0.40–1.50)
Calcium: 9.3 mg/dL (ref 8.4–10.5)
GFR: 74.13 mL/min (ref >60.00)
GLUCOSE: 89 mg/dL (ref 70–99)
Potassium: 4 mEq/L (ref 3.5–5.1)
Sodium: 139 mEq/L (ref 135–145)

## 2014-10-23 LAB — LIPID PANEL
Cholesterol: 116 mg/dL (ref 0–200)
HDL: 44 mg/dL (ref >39.00)
LDL Cholesterol: 61 mg/dL (ref 0–99)
NONHDL: 72
Total CHOL/HDL Ratio: 3
Triglycerides: 54 mg/dL (ref 0.0–149.0)
VLDL: 10.8 mg/dL (ref 0.0–40.0)

## 2014-10-23 LAB — TSH: TSH: 0.81 u[IU]/mL (ref 0.35–4.50)

## 2014-10-23 MED ORDER — LOSARTAN POTASSIUM 100 MG PO TABS: 100.0000 mg | ORAL_TABLET | Freq: Every day | ORAL | Status: DC

## 2014-10-23 NOTE — Progress Notes (Signed)
Pre visit review using our clinic review tool, if applicable. No additional management support is needed unless otherwise documented below in the visit note. 

## 2014-10-23 NOTE — Patient Instructions (Signed)
Schedule your complete physical in 6 months We'll notify you of your lab results and make any changes if needed Please call and schedule an appt w/ Almyra Free at [phone] Continue to get regular exercise- this is good for both the Parkinson's and the mood Call with any questions or concerns Hang in there!!!

## 2014-10-23 NOTE — Progress Notes (Signed)
   Subjective:    Patient ID: [name], male    DOB: Nov 27, 1933, 81 y.o.   MRN: [id number]  HPI HTN- chronic problem, on Losartan daily.  BP is better controlled today.  Pt reports home BPs are typically running in the 150s but 'then it will go down'.  Denies CP, SOB, HAs, visual changes.  + trace edema of R LE  Hyperlipidemia- chronic problem, on Lipitor 40mg  daily.  Denies abd pain, N/V, myalgias.  Depression- pt reports 'life is getting more frustrating by the day'.  'i have more stuff to do and less time to do it'.  Pt is having a hard time accepting his Parkinson's limitations.  Currently on Lexapro.  Review of Systems For ROS see HPI     Objective:   Physical Exam  Constitutional: He is oriented to person, place, and time. He appears well-developed and well-nourished. No distress.  HENT:  Head: Normocephalic and atraumatic.  Eyes: Conjunctivae and EOM are normal. Pupils are equal, round, and reactive to light.  Neck: Normal range of motion. Neck supple. No thyromegaly present.  Cardiovascular: Normal rate, regular rhythm, normal heart sounds and intact distal pulses.   No murmur heard. Pulmonary/Chest: Effort normal and breath sounds normal. No respiratory distress.  Abdominal: Soft. Bowel sounds are normal. He exhibits no distension.  Musculoskeletal: He exhibits edema (trace edema of R LE).  Lymphadenopathy:    He has no cervical adenopathy.  Neurological: He is alert and oriented to person, place, and time. No cranial nerve deficit.  Skin: Skin is warm and dry.  Psychiatric: His behavior is normal.  Difficulty w/ word finding Very frustrated at his memory issues  Vitals reviewed.         Assessment & Plan:

## 2014-10-25 ENCOUNTER — Ambulatory Visit: Payer: Self-pay | Admitting: Family Medicine

## 2014-11-04 NOTE — Assessment & Plan Note (Signed)
Chronic problem.  Tolerating statin w/o difficulty.  Check labs.  Adjust meds prn  

## 2014-11-04 NOTE — Assessment & Plan Note (Signed)
Chronic problem.  Adequate control.  Asymptomatic.  Check labs.  No anticipated med changes 

## 2014-11-04 NOTE — Assessment & Plan Note (Signed)
Deteriorated.  Pt is very frustrated w/ his Parkinson's.  Based on this, I encouraged him to resume counseling to work through some of his anger/frustration.  Pt is agreeable to this.  #s provided.

## 2014-11-07 ENCOUNTER — Other Ambulatory Visit: Payer: Self-pay | Admitting: Neurology

## 2014-11-08 NOTE — Telephone Encounter (Signed)
CArbidopa Levodopa 50/200 refill requested. Per last office note- patient to remain on medication. Refill approved and sent to patient's pharmacy.

## 2014-11-22 ENCOUNTER — Other Ambulatory Visit: Payer: Self-pay | Admitting: Urology

## 2014-11-29 ENCOUNTER — Encounter: Payer: Self-pay | Admitting: Neurology

## 2014-11-29 ENCOUNTER — Ambulatory Visit (INDEPENDENT_AMBULATORY_CARE_PROVIDER_SITE_OTHER): Payer: Medicare Other | Admitting: Neurology

## 2014-11-29 VITALS — BP 118/68 | HR 88 | Ht 70.5 in | Wt 178.0 lb

## 2014-11-29 DIAGNOSIS — R682 Dry mouth, unspecified: Secondary | ICD-10-CM | POA: Diagnosis not present

## 2014-11-29 DIAGNOSIS — G20 Parkinson's disease: Secondary | ICD-10-CM

## 2014-11-29 DIAGNOSIS — F330 Major depressive disorder, recurrent, mild: Secondary | ICD-10-CM | POA: Diagnosis not present

## 2014-11-29 NOTE — Progress Notes (Signed)
[name] was seen today in the movement disorders clinic for neurologic consultation at the request of Annye Asa, MD.  The consultation is for the evaluation of PD.  The pt is accompanied by his wife who supplements the hx.  The patient was previously seen by [name] and I did have the opportunity to review those records.  Patient is wishing to transfer care because of the length of travel to [facility]  The first symptom(s) the patient noticed was unilateral hand tremor of the L hand and this was approximately 2005.  Pt is L hand dominant.   Pt went to his PCP and he was referred to [name] and was dx with PD.  I do not have those records. Pt believes that he was started on requip but doesn't think that it helped.  He thinks that he was on it for about a year and his wife believes that it caused sleep attacks.  He is not sure what the next medication was but the physician at [name] left and he was then referred to [name] and he has been seeing him and his NP since.  The patient is currently on carbidopa/levodopa 50/200 CR 1-1/2 tablets at 8 AM/ and then one at noon/4pm/8pm along with a carbidopa/levodopa 25/100 in the afternoon and in the evening.  He admits that he often misses dosages.  He is on amantadine 100 mg 4 times a day and selegiline 5 mg twice a day.  01/02/14 update:  The patient returns today for followup.  Last visit, I discontinued the patient's extended release levodopa as he was on a strange combination of immediate release and extended release carbidopa/levodopa.  He is currently on carbidopa/levodopa 25/100 IR and takes 2 tablets at 8 AM/noon/4 PM/8 PM and then carbidopa/levodopa 50/200 at night.  Pt states that the first week after we changed, he felt very sleepy but he no longer feels that way.  I asked him to move his selegiline to 8 AM and noon because of reported insomnia.  The patient did have a modified barium swallow test done since last  visit.  That was normal.  Since our last visit, the patient did have a bladder neoplasm removed on 12/19/2013 and a stent was placed.  The patient has recovered well.  He still has quite a bit of hematuria though.  He does need the stent removed.  No falls.  Limited exercise.  Was referred to neurorehab since last visit but states that he chose not to go.  Going to see mental health counselor at [facility] and that is helping.  Remains on amantadine.  04/30/14 update:   He is currently on carbidopa/levodopa 25/100 IR and takes 2 tablets at 8 AM/noon/4 PM/8 PM and then carbidopa/levodopa 50/200 at night. He is still on selegeline 5 mg bid and amantadine 100 mg.  States that often times, he is taking the selegeline too late and then has trouble sleeping.  He on Lexapro for depression.  He has not had any falls since last visit.  Not formally exercising; "I know that it needs to be done but I cannot seem to get it in the routine."  No hallucinations.  Finds that he is sleeping a bit more during the day.  Did start vesicare and that may have affected balance.  It may have helped the bladder, however.     07/30/14 update:  He is currently on carbidopa/levodopa 25/100 IR and takes 2 tablets  at 8 AM/noon/4 PM/8 PM and then carbidopa/levodopa 50/200 at night.  He is also on selegiline twice per day.  He takes amantadine 100 mg tid.  I reviewed records made available to me since last visit.  The patient went to the emergency room on 06/14/2014.  He accidentally hit his head against the car door (it was a windy day and the car door shut on him) and sustained a laceration above the left forehead.  There was no loss of consciousness or alteration of consciousness.  Dermabond was placed and the patient was sent home from the emergency room.  He c/o dry mouth and thinks that it is due to medication.  He signed up at the [facility] for exercise.  He just signed up Saturday afternoon.  No lightheadness/near syncope.   No hallucinations.  Feels a little "dopey" in the AM.  Once he takes his medications, he feels less cognitively dull.  However, he also thinks his medications may contribute to cognitive dulling.  He also complains of decreased feeling in the first to third fingers (thumb, pointer and middle) bilaterally.  He has difficulty with grasp and dropping objects.  He notices it most when he is trying to text and has to use a stylus for that.  11/29/14 update:  Pt is seen today in f/u.  He remains on carbidopa/levodopa 25/100, 2 tablets at 8 AM/12 PM/4 PM/8 p.m. in addition to carbidopa/levodopa 50/200 at night.  He is on selegiline at 8 AM and noon and amantadine, 100 mg 3 times a day.  I reviewed notes from his primary care provider.  He saw her on 11/04/2014.  He was reporting increasing depression.  He was given numbers to contact for counseling.  He hasn't done that yet.  His wife got a promotion at work and he misses having her home. He remains on Lexapro. He has no SI/HI.  He reports that he has good and bad days but is overall the same as previous.  He reports that a few weeks ago he started to have the need for an afternoon nap.  He is only asleep for 15-20 minutes (he is unsure if the unscheduled nap happens before or after the second dose of selegeline).  He is having some difficulty with his voice.  He also c/o dry mouth despite biotene. He did have a fall since last visit in his garage; states that he lost balance and went to the left and got some superficial abrasions.  Admits that while he joined [employer], he is going very little.  He doesn't like going without his wife.  Notes more difficulty tying shoes on the right.   PREVIOUS MEDICATIONS: Sinemet, Sinemet CR, Requip and Eldpryl Requip (sleep attacks)  ALLERGIES:  No Known Allergies  CURRENT MEDICATIONS:  Current Outpatient Prescriptions on File Prior to Visit  Medication Sig Dispense Refill  . amantadine (SYMMETREL) 100 MG capsule Take 1  capsule (100 mg total) by mouth 3 (three) times daily. 90 capsule 5  . aspirin 81 MG tablet Take 162 mg by mouth daily.     Marland Kitchen atorvastatin (LIPITOR) 40 MG tablet Take 1 tablet (40 mg total) by mouth daily. 90 tablet 1  . Ca Carbonate-Mag Hydroxide (ROLAIDS PO) Take by mouth as needed.    . carbidopa-levodopa (SINEMET CR) 50-200 MG per tablet TAKE ONE TABLET BY MOUTH NIGHTLY AT BEDTIME 30 tablet 5  . carbidopa-levodopa (SINEMET IR) 25-100 MG per tablet TAKE TWO TABLETS BY MOUTH FOUR TIMES  DAILY 240 tablet 5  . celecoxib (CELEBREX) 200 MG capsule Take 1 capsule (200 mg total) by mouth daily. 30 capsule 3  . diclofenac sodium (VOLTAREN) 1 % GEL Apply topically as needed.    Marland Kitchen escitalopram (LEXAPRO) 10 MG tablet TAKE ONE TABLET BY MOUTH EVERY EVENING 30 tablet 5  . fexofenadine (ALLEGRA) 180 MG tablet Take 180 mg by mouth daily.     . fluorouracil (EFUDEX) 5 % cream Apply topically 2 (two) times daily.    . fluticasone (FLONASE) 50 MCG/ACT nasal spray USE TWO SPRAYS IN EACH NOSTRIL DAILY  16 g 3  . ibuprofen (ADVIL,MOTRIN) 200 MG tablet Take 200 mg by mouth every 6 (six) hours as needed.    Marland Kitchen losartan (COZAAR) 100 MG tablet Take 1 tablet (100 mg total) by mouth daily. 30 tablet 6  . oxyCODONE-acetaminophen (PERCOCET) 5-325 MG per tablet Take 1 tablet by mouth every 4 (four) hours as needed. For back pain    . polyethylene glycol (MIRALAX / GLYCOLAX) packet Take 17 g by mouth daily as needed. For laxative    . Probiotic Product (PROBIOTIC DAILY PO) Take 1 capsule by mouth daily.    . selegiline (ELDEPRYL) 5 MG tablet Take 1 tablet (5 mg total) by mouth 2 (two) times daily with a meal. Last dose no later than 2 pm 60 tablet 5  . tamsulosin (FLOMAX) 0.4 MG CAPS capsule Take 0.4 mg by mouth daily.    . TURMERIC PO Take 1 capsule by mouth daily.    . VESICARE 10 MG tablet   10   No current facility-administered medications on file prior to visit.    PAST MEDICAL HISTORY:   Past Medical History    Diagnosis Date  . Hyperlipidemia   . Hypertension   . Parkinson disease DX   2005    NEUROLOGIST-   DR Quang Thorpe--  IDIOPATHIC PARKINSON'S /  TREMORS CONTROLLED WITH MEDS  . GERD (gastroesophageal reflux disease)   . Allergic rhinitis   . OAB (overactive bladder)   . Frequency of urination   . Urge urinary incontinence   . Nocturia   . BPH with elevated PSA   . Osteoarthritis     KNEES, SHOULDER  . DDD (degenerative disc disease), lumbar   . Hearing loss     does not wear his hearing aid  . Mild obstructive sleep apnea     PER PT  MILD OSA , NO CPAP RX,  STUDY DONE 2009  . Depression     PAST SURGICAL HISTORY:   Past Surgical History  Procedure Laterality Date  . Nasal sinus surgery  1982  . Inguinal hernia repair  03/09/2012    Procedure: HERNIA REPAIR INGUINAL ADULT;  Surgeon: Adin Hector, MD;  Location: [facility];  Service: General;  Laterality: Right;  . Insertion of mesh  03/09/2012    Procedure: INSERTION OF MESH;  Surgeon: Adin Hector, MD;  Location: [facility];  Service: General;  Laterality: Right;  . Removal benign cyst left forehead  1969  . Orif right arm fx  1949    HARDWARE REMOVED   . Cataract extraction w/ intraocular lens  implant, bilateral  2014  . Cystoscopy with stent placement Left 12/19/2013    Procedure: CYSTOSCOPY BLADDER BX, LEFT URETERAL STENT PLACEMENT , LEFT RETROGRADE AND PYLOGRAM;  Surgeon: Festus Aloe, MD;  Location: [facility];  Service: Urology;  Laterality: Left;  . Transurethral resection of bladder tumor N/A 12/19/2013  Procedure:  TRANSURETHRAL RESECTION OF BLADDER TUMOR WITH GYRUS (TURBT-GYRUS);  Surgeon: Festus Aloe, MD;  Location: [facility];  Service: Urology;  Laterality: N/A;    SOCIAL HISTORY:   Social History   Social History  . Marital Status: Married    Spouse Name: N/A  . Number of Children: N/A  . Years of Education: N/A   Occupational History  . Not on file.   Social History  Main Topics  . Smoking status: Former Smoker -- 1.00 packs/day for 15 years    Types: Cigarettes    Quit date: 04/20/1968  . Smokeless tobacco: Never Used  . Alcohol Use: 4.2 oz/week    7 Glasses of wine per week     Comment: ONE WINE PER DAY  . Drug Use: No  . Sexual Activity: Not on file   Other Topics Concern  . Not on file   Social History Narrative    FAMILY HISTORY:   Family Status  Relation Status Death Age  . Mother Deceased     alzeihmer's disease  . Father Deceased     liver cancer, colon cancer  . Brother Alive   . Brother Deceased     killed   . Daughter Alive     ROS:  A complete 10 system review of systems was obtained and was unremarkable apart from what is mentioned above.  PHYSICAL EXAMINATION:    VITALS:   Filed Vitals:   11/29/14 1118  BP: 118/68  Pulse: 88  Height: 5' 10.5" (1.791 m)  Weight: 178 lb (80.74 kg)   Wt Readings from Last 3 Encounters:  11/29/14 178 lb (80.74 kg)  10/23/14 184 lb 2 oz (83.519 kg)  07/30/14 190 lb (86.183 kg)    GEN:  The patient appears stated age and is in NAD. HEENT:  Normocephalic, atraumatic.  The mucous membranes are moist. The superficial temporal arteries are without ropiness or tenderness. CV:  RRR Lungs:  CTAB Neck/HEME:  There are no carotid bruits bilaterally.  Neurological examination:  Orientation: The patient is alert and oriented x3. Cranial nerves: There is good facial symmetry.  There is mild facial hypomimia.   Extraocular muscles are intact. The visual fields are full to confrontational testing. The speech is fluent and clear.  The patient has some difficulty with the guttural sounds.  He is mildly hypophonic.  Soft palate rises symmetrically and there is no tongue deviation. Hearing is intact to conversational tone. Sensation: Sensation is intact to light touch throughout. Motor: Strength is 5/5 in the bilateral upper and lower extremities.   Shoulder shrug is equal and symmetric.  There is  no pronator drift.   Movement examination: Tone: There is normal tone in the bilateral upper extremities.  The tone in the lower extremities is normal.  Abnormal movements: There is minor tremor in the bilateral UE, that can be felt more than seen Coordination:  There is decreased finger taps on the right Gait and Station: The patient has no significant difficulty arising out of a deep-seated chair without the use of the hands. The patient's stride length is slightly decreased.  There is slight increase in tremor in the L hand with ambulation.   There is mild camptocormia to the L.  There is decreased arm swing on the right  ASSESSMENT/PLAN:  1.  Idiopathic Parkinson's disease  -He will continue taking carbidopa/levodopa 25/100 IR 2 tablets at 8 AM/noon/4 PM/8 PM and then take the 50/200 CR at bedtime.  -For  now, the patient will remain on selegiline at 8 AM and noon.  He will remain on amantadine, 100 mg 3 times per day  -I. encouraged again safe, cardiovascular exercise.  He just got a membership at the [facility] but isn't really going and encouraged him to do so.    -I will send him to the neuro rehabilitation center for PT/OT/ST 2.  Dysphagia.  -MBE on 09/29/13 was normal 3.  Depression.  -He is on Lexapro.  I think that cardiovascular exercise would greatly help.    -I encouraged him to attend counseling as did Dr. Birdie Riddle 4.  mild cognitive impairment.  -This is likely related to the Parkinson's, but I also think that depression contributes to memory change.  I do not see dementia at this point.  Talked about possible contribution of selegeline, amantadine, vesicare.  Not sure that any of these is contributing but it is possible.  5.  Urinary incontinence  -on vesicare.  Thinks may have affected the balance but no falls and doing okay.  Thinks that may have made dry mouth worse. 6.  Hand paresthesias  -May represent carpal tunnel syndrome.  Talked about the value of an EMG.  He wants to hold  on that.  He will let me know if he changes his mind. 7.  Dry mouth  -c/o of this again today.  Talked to him about the fact that amantadine and vesicare may contribute.  Told him that we could decrease amantadine and he is going to try to decrease to bid.  If still has dry mouth we can drop it further.   8.  I will plan on seeing him back in the next few months, sooner should new neurologic issues arise.  Much greater than 50% of this visit was spent in counseling with the patient and the family.  Total face to face time:  30 min

## 2014-11-29 NOTE — Patient Instructions (Signed)
1.  Decrease amantadine to twice per day.  If you still have dry mouth, you can drop it to once per day but let me know if you do that 2.  We will refer you to the neurorehab center for physical, occupational and speech therapy

## 2014-12-03 ENCOUNTER — Other Ambulatory Visit: Payer: Self-pay | Admitting: Neurology

## 2014-12-03 NOTE — Telephone Encounter (Signed)
Selegiline refill requested. Per last office note- patient to remain on medication. Refill approved and sent to patient's pharmacy.

## 2014-12-10 ENCOUNTER — Encounter (HOSPITAL_BASED_OUTPATIENT_CLINIC_OR_DEPARTMENT_OTHER): Payer: Self-pay | Admitting: *Deleted

## 2014-12-11 ENCOUNTER — Encounter (HOSPITAL_BASED_OUTPATIENT_CLINIC_OR_DEPARTMENT_OTHER): Payer: Self-pay | Admitting: *Deleted

## 2014-12-11 NOTE — Progress Notes (Signed)
NPO AFTER MN.  ARRIVE AT 0600. NEEDS ISTAT.  CURRENT EKG IN CHART AND EPIC. WILL TAKE SINEMET, SYMMETRAL, AND ELDEPRYL AM DOS W/ SIPS OF WATER.

## 2014-12-12 NOTE — H&P (Signed)
History of Present Illness                   F/u - PCP Dr. Birdie Riddle       1-bladder cancer - September 2015 TURBT, low-grade TA, required resection of left UO and stent placement. Presented with MH and irritative voiding symptoms. Stent removed in office.   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 (Max capacity was 322 cc, hypersensitivity noted and a first sensation occurring at 175 cc. Detrusor instability was identified with his first contraction occurring at 322 cc and a maximum unstable bladder contraction pressure of 58 cm H2O associated with leakage of the entire bladder volume. Voided 198 cc with a maximum flow rate of 7 cc/second and maximum detrusor pressure at maximum flow of 38 cm H2O.   NG risk include Parkinson's  Tried - oxybutynin ER 15 mg and Vesicare 10 mg as well as Myrbetriq. He failed all of these. The Myrbetriq may have helped somewhat but caused an elevation of his blood pressure and made him feel poorly. PTNS was ordered.   Most bothersome is urgency urge incontinence and nocturia 2. Myrbetriq and antimuscarinics cause dry mouth. Occ weak stream.  -Jul 2015 tried tamsulosin  -Dec 2015 - nl PVR, frequency, urgency. Tried Vesicare and tamsulosin.    4- Organic erectile dysfunction: He was seen by Dr. Janice Norrie for this in 2000. Viagra was prescribed at that time.      Aug 2016 interval hx  Patient returns in  continuing management of bladder cancer and BPH with lower urinary tract symptoms.    He's had no dysuria or gross hematuria. He continues Vesicare and tamsulosin and has had a significant reduction in urgency and urge incontinence episode. Also nocturia has improved. He continues to wear a diaper but stays dry.   Past Medical History Problems  1. History of Anxiety (F41.9) 2. History of Arthritis 3. History of esophageal reflux (Z87.19) 4. History of hypertension (Z86.79) 5. History of Parkinson's disease (G20)  Surgical History Problems  1. History of Cystoscopy With Fulguration Medium Lesion (2-5cm) 2. History of Cystoscopy With Insertion Of Ureteral Stent Left 3. History of Incision And Drainage Of Skin Abscess Forehead  Current Meds 1. Allegra 180 MG TABS;  Therapy: (Recorded:04Apr2013) to Recorded 2. Amantadine HCl - 100 MG Oral Capsule;  Therapy: 13Sep2012 to Recorded 3. Aspirin 81 MG TABS;  Therapy: (Recorded:04Aug2016) to Recorded 4. Atorvastatin Calcium 40 MG Oral Tablet;  Therapy: 11Oct2012 to Recorded 5. Carbidopa-Levodopa 25-100 MG Oral Tablet;  Therapy: 27Sep2012 to Recorded 6. Celecoxib 200 MG Oral Capsule;  Therapy: (Recorded:04Aug2016) to Recorded 7. Fluticasone Propionate 50 MCG/ACT Nasal Suspension;  Therapy: [id number] to Recorded 8. Ibuprofen TABS;  Therapy: (Recorded:08Jan2015) to Recorded 9. Lexapro 10 MG Oral Tablet;  Therapy: (Recorded:08Jan2015) to Recorded 10. Losartan Potassium 100 MG Oral Tablet;   Therapy: [id number] to Recorded 11. Omeprazole  40 MG Oral Capsule Delayed Release;   Therapy: 11Oct2012 to Recorded 12. Polyethylene Glycol POWD;   Therapy: (MCNOBSJG:[id number]) to Recorded 13. Probiotic CAPS;   Therapy: (Recorded:08Jan2015) to Recorded 14. Selegiline HCl - 5 MG Oral Tablet;   Therapy: (Recorded:08Jan2015) to Recorded 15. Tamsulosin HCl - 0.4 MG Oral Capsule; TAKE ONE CAPSULE BY MOUTH ONE TIME   DAILY;   Therapy: [id number] to  (Evaluate:17Aug2016)  Requested for: [id number]; Last   Rx:18Jul2016 Ordered 16. VESIcare 10 MG Oral Tablet; Take 1 tablet every day;   Therapy: [id number] to (Evaluate:28Mar2016)  Requested for: [id number]; Last   Rx:07Mar2016 Ordered  Allergies Medication  1. No Known Drug Allergies  Family History Problems  1. Family history of Alzheimer's Disease : Mother 2. Family history of Cancer : Father  Social History Problems  1. Alcohol Use   drinks one glass of wine most days 2. Caffeine Use   3 - 4 servings daily 3. Marital History - Currently Married 4. Never A Smoker 5. Occupation: Retired  Engineer, site Vital Signs [Data Includes: Last 1 Day]  Recorded: 04Aug2016 10:32AM  Blood Pressure: 121 / 80 Temperature: 97.6 F Heart Rate: 83  Physical Exam Constitutional: Well nourished and well developed . No acute distress.  Neuro/Psych:. Mood and affect are appropriate.    Results/Data Urine [Data Includes: Last 1 Day]   [id number]  COLOR AMBER   APPEARANCE CLEAR   SPECIFIC GRAVITY >1.030   pH 5.5   GLUCOSE NEGATIVE   BILIRUBIN NEGATIVE   KETONE NEGATIVE   BLOOD NEGATIVE   PROTEIN NEGATIVE   NITRITE NEGATIVE   LEUKOCYTE ESTERASE NEGATIVE    Procedure  Procedure: Cystoscopy   Informed Consent: Risks, benefits, and potential adverse events were discussed and informed consent was obtained from the patient.  Prep: The patient was prepped with betadine.  Antibiotic prophylaxis: Ciprofloxacin.  Procedure Note:  Urethral meatus:. No abnormalities.  Anterior urethra: No abnormalities.  Prostatic urethra: No abnormalities . An enlarged intravesical median lobe was visualized.  Bladder: Visulization was clear. The ureteral orifices were in the normal anatomic position bilaterally and had clear efflux of urine. Multiple tumors were identified in the bladder. This tumor was located near the dome of the bladder. They are small and early papillary. The patient tolerated the procedure  well.  Complications: None.    Assessment Assessed  1. 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 UA With REFLEX; [Do Not Release]; Status:Complete;   Done: [id number] 10:00AM Malignant neoplasm of lateral wall of urinary bladder  2. Follow-up Schedule Surgery Office  Follow-up  Status: Hold For - Appointment   Requested for: 04Aug2016  Discussion/Summary    Frequency, urgency-stable on Vesicare and tamsulosin.    Bladder cancer-possible small recurrences at the dome. Discussed the nature risks benefits and alternatives to exam under anesthesia, cystoscopy, bladder biopsy with fulguration, bilateral retrograde pyelogram with postop instillation of mitomycin-C. All questions answered and he elects to proceed.    Elevated PSA, BPH - repeat exam under anesthesia. PSA with    Signatures Electronically signed by : Festus Aloe, M.D.; Nov 22 2014 11:21AM EST

## 2014-12-14 ENCOUNTER — Encounter (HOSPITAL_BASED_OUTPATIENT_CLINIC_OR_DEPARTMENT_OTHER): Payer: Self-pay | Admitting: *Deleted

## 2014-12-14 ENCOUNTER — Ambulatory Visit (HOSPITAL_BASED_OUTPATIENT_CLINIC_OR_DEPARTMENT_OTHER): Payer: Medicare Other | Admitting: Anesthesiology

## 2014-12-14 ENCOUNTER — Ambulatory Visit (HOSPITAL_BASED_OUTPATIENT_CLINIC_OR_DEPARTMENT_OTHER)
Admission: RE | Admit: 2014-12-14 | Discharge: 2014-12-14 | Disposition: A | Discharge status: Home / Self Care | Payer: Medicare Other | Source: Ambulatory Visit | Attending: Urology | Admitting: Urology

## 2014-12-14 ENCOUNTER — Encounter (HOSPITAL_BASED_OUTPATIENT_CLINIC_OR_DEPARTMENT_OTHER)
Admission: RE | Disposition: A | Discharge status: Home / Self Care | Payer: Self-pay | Source: Ambulatory Visit | Attending: Urology

## 2014-12-14 DIAGNOSIS — C679 Malignant neoplasm of bladder, unspecified: Secondary | ICD-10-CM | POA: Diagnosis present

## 2014-12-14 DIAGNOSIS — Z87891 Personal history of nicotine dependence: Secondary | ICD-10-CM | POA: Insufficient documentation

## 2014-12-14 DIAGNOSIS — Z791 Long term (current) use of non-steroidal anti-inflammatories (NSAID): Secondary | ICD-10-CM | POA: Insufficient documentation

## 2014-12-14 DIAGNOSIS — M1990 Unspecified osteoarthritis, unspecified site: Secondary | ICD-10-CM | POA: Diagnosis not present

## 2014-12-14 DIAGNOSIS — N138 Other obstructive and reflux uropathy: Secondary | ICD-10-CM | POA: Diagnosis not present

## 2014-12-14 DIAGNOSIS — C671 Malignant neoplasm of dome of bladder: Secondary | ICD-10-CM | POA: Insufficient documentation

## 2014-12-14 DIAGNOSIS — I10 Essential (primary) hypertension: Secondary | ICD-10-CM | POA: Insufficient documentation

## 2014-12-14 DIAGNOSIS — Z7951 Long term (current) use of inhaled steroids: Secondary | ICD-10-CM | POA: Diagnosis not present

## 2014-12-14 DIAGNOSIS — N359 Urethral stricture, unspecified: Secondary | ICD-10-CM | POA: Insufficient documentation

## 2014-12-14 DIAGNOSIS — G20 Parkinson's disease: Secondary | ICD-10-CM | POA: Diagnosis not present

## 2014-12-14 DIAGNOSIS — R350 Frequency of micturition: Secondary | ICD-10-CM | POA: Insufficient documentation

## 2014-12-14 DIAGNOSIS — G709 Myoneural disorder, unspecified: Secondary | ICD-10-CM | POA: Insufficient documentation

## 2014-12-14 DIAGNOSIS — F329 Major depressive disorder, single episode, unspecified: Secondary | ICD-10-CM | POA: Insufficient documentation

## 2014-12-14 DIAGNOSIS — N401 Enlarged prostate with lower urinary tract symptoms: Secondary | ICD-10-CM | POA: Diagnosis not present

## 2014-12-14 DIAGNOSIS — Z7982 Long term (current) use of aspirin: Secondary | ICD-10-CM | POA: Diagnosis not present

## 2014-12-14 DIAGNOSIS — R972 Elevated prostate specific antigen [PSA]: Secondary | ICD-10-CM | POA: Diagnosis not present

## 2014-12-14 DIAGNOSIS — N3941 Urge incontinence: Secondary | ICD-10-CM | POA: Diagnosis not present

## 2014-12-14 DIAGNOSIS — K219 Gastro-esophageal reflux disease without esophagitis: Secondary | ICD-10-CM | POA: Insufficient documentation

## 2014-12-14 DIAGNOSIS — Z79899 Other long term (current) drug therapy: Secondary | ICD-10-CM | POA: Diagnosis not present

## 2014-12-14 DIAGNOSIS — F419 Anxiety disorder, unspecified: Secondary | ICD-10-CM | POA: Diagnosis not present

## 2014-12-14 HISTORY — DX: Dry mouth, unspecified: R68.2

## 2014-12-14 HISTORY — PX: CYSTOSCOPY W/ RETROGRADES: SHX1426

## 2014-12-14 HISTORY — DX: Malignant neoplasm of bladder, unspecified: C67.9

## 2014-12-14 LAB — POCT I-STAT, CHEM 8
BUN: 35 mg/dL — ABNORMAL HIGH (ref 6–20)
CALCIUM ION: 1.24 mmol/L (ref 1.13–1.30)
CREATININE: 1.1 mg/dL (ref 0.61–1.24)
Chloride: 103 mmol/L (ref 101–111)
GLUCOSE: 103 mg/dL — AB (ref 65–99)
HCT: 46 % (ref 39.0–52.0)
HEMOGLOBIN: 15.6 g/dL (ref 13.0–17.0)
POTASSIUM: 4.1 mmol/L (ref 3.5–5.1)
Sodium: 142 mmol/L (ref 135–145)
TCO2: 24 mmol/L (ref 0–100)

## 2014-12-14 SURGERY — CYSTOSCOPY, WITH RETROGRADE PYELOGRAM
Anesthesia: General | Site: Bladder | Laterality: Bilateral

## 2014-12-14 MED ORDER — CEFAZOLIN SODIUM 1-5 GM-% IV SOLN
1.0000 g | INTRAVENOUS | Status: DC
  Filled 2014-12-14: qty 50

## 2014-12-14 MED ORDER — PHENAZOPYRIDINE HCL 100 MG PO TABS: 100.0000 mg | ORAL_TABLET | Freq: Three times a day (TID) | ORAL | Status: DC | PRN

## 2014-12-14 MED ORDER — EPHEDRINE SULFATE 50 MG/ML IJ SOLN
INTRAMUSCULAR | Status: DC | PRN
  Administered 2014-12-14: 5 mg via INTRAVENOUS
  Administered 2014-12-14: 10 mg via INTRAVENOUS

## 2014-12-14 MED ORDER — FENTANYL CITRATE (PF) 100 MCG/2ML IJ SOLN
INTRAMUSCULAR | Status: AC
  Filled 2014-12-14: qty 6

## 2014-12-14 MED ORDER — PROMETHAZINE HCL 25 MG/ML IJ SOLN
6.2500 mg | INTRAMUSCULAR | Status: DC | PRN
  Filled 2014-12-14: qty 1

## 2014-12-14 MED ORDER — ASPIRIN 81 MG PO TABS: 162.0000 mg | ORAL_TABLET | Freq: Every day | ORAL | Status: DC

## 2014-12-14 MED ORDER — MEPERIDINE HCL 25 MG/ML IJ SOLN
6.2500 mg | INTRAMUSCULAR | Status: DC | PRN
  Filled 2014-12-14: qty 1

## 2014-12-14 MED ORDER — ONDANSETRON HCL 4 MG/2ML IJ SOLN
INTRAMUSCULAR | Status: DC | PRN
  Administered 2014-12-14: 4 mg via INTRAVENOUS

## 2014-12-14 MED ORDER — CEFAZOLIN SODIUM-DEXTROSE 2-3 GM-% IV SOLR
INTRAVENOUS | Status: AC
  Filled 2014-12-14: qty 50

## 2014-12-14 MED ORDER — FENTANYL CITRATE (PF) 100 MCG/2ML IJ SOLN
25.0000 ug | INTRAMUSCULAR | Status: DC | PRN
  Filled 2014-12-14: qty 1

## 2014-12-14 MED ORDER — FENTANYL CITRATE (PF) 100 MCG/2ML IJ SOLN
INTRAMUSCULAR | Status: DC | PRN
  Administered 2014-12-14: 50 ug via INTRAVENOUS
  Administered 2014-12-14 (×2): 25 ug via INTRAVENOUS

## 2014-12-14 MED ORDER — STERILE WATER FOR IRRIGATION IR SOLN
Status: DC | PRN
  Administered 2014-12-14 (×2): 3000 mL via INTRAVESICAL

## 2014-12-14 MED ORDER — CEFAZOLIN SODIUM-DEXTROSE 2-3 GM-% IV SOLR
2.0000 g | INTRAVENOUS | Status: AC
  Administered 2014-12-14: 2 g via INTRAVENOUS
  Filled 2014-12-14: qty 50

## 2014-12-14 MED ORDER — PROPOFOL 10 MG/ML IV BOLUS
INTRAVENOUS | Status: DC | PRN
  Administered 2014-12-14: 160 mg via INTRAVENOUS

## 2014-12-14 MED ORDER — IOHEXOL 350 MG/ML SOLN
INTRAVENOUS | Status: DC | PRN
  Administered 2014-12-14: 21 mL

## 2014-12-14 MED ORDER — LIDOCAINE HCL (CARDIAC) 20 MG/ML IV SOLN
INTRAVENOUS | Status: DC | PRN
  Administered 2014-12-14: 60 mg via INTRAVENOUS

## 2014-12-14 MED ORDER — DEXAMETHASONE SODIUM PHOSPHATE 4 MG/ML IJ SOLN
INTRAMUSCULAR | Status: DC | PRN
Start: 2014-12-14 — End: 2014-12-14
  Administered 2014-12-14: 8 mg via INTRAVENOUS

## 2014-12-14 MED ORDER — LACTATED RINGERS IV SOLN
INTRAVENOUS | Status: DC
  Administered 2014-12-14 (×2): via INTRAVENOUS
  Filled 2014-12-14: qty 1000

## 2014-12-14 MED ORDER — PHENAZOPYRIDINE HCL 100 MG PO TABS
ORAL_TABLET | ORAL | Status: AC
  Filled 2014-12-14: qty 1

## 2014-12-14 MED ORDER — PHENAZOPYRIDINE HCL 100 MG PO TABS
100.0000 mg | ORAL_TABLET | Freq: Three times a day (TID) | ORAL | Status: DC
  Administered 2014-12-14: 100 mg via ORAL
  Filled 2014-12-14: qty 1

## 2014-12-14 MED ORDER — MITOMYCIN CHEMO FOR BLADDER INSTILLATION 40 MG
40.0000 mg | Freq: Once | INTRAVENOUS | Status: AC
  Administered 2014-12-14: 40 mg via INTRAVESICAL
  Filled 2014-12-14: qty 40

## 2014-12-14 SURGICAL SUPPLY — 24 items
BAG DRAIN URO-CYSTO SKYTR STRL (DRAIN) ×2 IMPLANT
BAG DRN UROCATH (DRAIN) ×1
BAG URINE DRAINAGE (UROLOGICAL SUPPLIES) ×1 IMPLANT
CATH FOLEY 2WAY SLVR  5CC 18FR (CATHETERS) ×1
CATH FOLEY 2WAY SLVR 5CC 18FR (CATHETERS) IMPLANT
CATH INTERMIT  6FR 70CM (CATHETERS) ×1 IMPLANT
CATH URET 5FR 28IN CONE TIP (BALLOONS) ×1
CATH URET 5FR 28IN OPEN ENDED (CATHETERS) ×1 IMPLANT
CATH URET 5FR 70CM CONE TIP (BALLOONS) IMPLANT
CLOTH BEACON ORANGE TIMEOUT ST (SAFETY) ×2 IMPLANT
ELECT REM PT RETURN 9FT ADLT (ELECTROSURGICAL) ×2
ELECTRODE REM PT RTRN 9FT ADLT (ELECTROSURGICAL) IMPLANT
GLOVE BIO SURGEON STRL SZ 6.5 (GLOVE) ×1 IMPLANT
GLOVE BIO SURGEON STRL SZ7.5 (GLOVE) ×2 IMPLANT
GLOVE INDICATOR 6.5 STRL GRN (GLOVE) ×1 IMPLANT
GOWN STRL REUS W/ TWL XL LVL3 (GOWN DISPOSABLE) ×1 IMPLANT
GOWN STRL REUS W/TWL XL LVL3 (GOWN DISPOSABLE) ×4
GUIDEWIRE STR DUAL SENSOR (WIRE) ×1 IMPLANT
HOLDER FOLEY CATH W/STRAP (MISCELLANEOUS) ×1 IMPLANT
MANIFOLD NEPTUNE II (INSTRUMENTS) ×1 IMPLANT
PACK CYSTO (CUSTOM PROCEDURE TRAY) ×2 IMPLANT
PLUG CATH AND CAP STER (CATHETERS) ×1 IMPLANT
SYRINGE 10CC LL (SYRINGE) ×2 IMPLANT
WATER STERILE IRR 3000ML UROMA (IV SOLUTION) ×2 IMPLANT

## 2014-12-14 NOTE — Discharge Instructions (Signed)
Cystoscopy, Care After °Refer to this sheet in the next few weeks. These instructions provide you with information on caring for yourself after your procedure. Your caregiver may also give you more specific instructions. Your treatment has been planned according to current medical practices, but problems sometimes occur. Call your caregiver if you have any problems or questions after your procedure. °HOME CARE INSTRUCTIONS  °Things you can do to ease any discomfort after your procedure include: °· Drinking enough water and fluids to keep your urine clear or pale yellow. °· Taking a warm bath to relieve any burning feelings. °SEEK IMMEDIATE MEDICAL CARE IF:  °· You have an increase in blood in your urine. °· You notice blood clots in your urine. °· You have difficulty passing urine. °· You have the chills. °· You have abdominal pain. °· You have a fever or persistent symptoms for more than 2-3 days. °· You have a fever and your symptoms suddenly get worse. °MAKE SURE YOU:  °· Understand these instructions. °· Will watch your condition. °· Will get help right away if you are not doing well or get worse. °Document Released: 10/24/2004 Document Revised: 12/07/2012 Document Reviewed: 09/28/2011 °[employer]® Patient Information ©2015 [employer]. This information is not intended to replace advice given to you by your health care provider. Make sure you discuss any questions you have with your health care provider. ° °Post Anesthesia Home Care Instructions ° °Activity: °Get plenty of rest for the remainder of the day. A responsible adult should stay with you for 24 hours following the procedure.  °For the next 24 hours, DO NOT: °-Drive a car °-Operate machinery °-Drink alcoholic beverages °-Take any medication unless instructed by your physician °-Make any legal decisions or sign important papers. ° °Meals: °Start with liquid foods such as gelatin or soup. Progress to regular foods as tolerated. Avoid greasy, spicy, heavy  foods. If nausea and/or vomiting occur, drink only clear liquids until the nausea and/or vomiting subsides. Call your physician if vomiting continues. ° °Special Instructions/Symptoms: °Your throat may feel dry or sore from the anesthesia or the breathing tube placed in your throat during surgery. If this causes discomfort, gargle with warm salt water. The discomfort should disappear within 24 hours. ° °If you had a scopolamine patch placed behind your ear for the management of post- operative nausea and/or vomiting: ° °1. The medication in the patch is effective for 72 hours, after which it should be removed.  Wrap patch in a tissue and discard in the trash. Wash hands thoroughly with soap and water. °2. You may remove the patch earlier than 72 hours if you experience unpleasant side effects which may include dry mouth, dizziness or visual disturbances. °3. Avoid touching the patch. Wash your hands with soap and water after contact with the patch. °  ° °

## 2014-12-14 NOTE — Transfer of Care (Signed)
Immediate Anesthesia Transfer of Care Note  Patient: [name]  Procedure(s) Performed: Procedure(s) (LRB): CYSTOSCOPY BLADDER BIOPSY FULGERATION MITOMYCIN C BILATERAL RETROGRADE PYELOGRAM (Bilateral)  Patient Location: PACU  Anesthesia Type: General  Level of Consciousness: awake, oriented, sedated and patient cooperative  Airway & Oxygen Therapy: Patient Spontanous Breathing and Patient connected to face mask oxygen  Post-op Assessment: Report given to PACU RN and Post -op Vital signs reviewed and stable  Post vital signs: Reviewed and stable  Complications: No apparent anesthesia complications

## 2014-12-14 NOTE — Anesthesia Postprocedure Evaluation (Signed)
  Anesthesia Post-op Note  Patient: [name]  Procedure(s) Performed: Procedure(s) (LRB): CYSTOSCOPY BLADDER BIOPSY FULGERATION MITOMYCIN C BILATERAL RETROGRADE PYELOGRAM (Bilateral)  Patient Location: PACU  Anesthesia Type: General  Level of Consciousness: awake and alert   Airway and Oxygen Therapy: Patient Spontanous Breathing  Post-op Pain: mild  Post-op Assessment: Post-op Vital signs reviewed, Patient's Cardiovascular Status Stable, Respiratory Function Stable, Patent Airway and No signs of Nausea or vomiting  Last Vitals:  Filed Vitals:   12/14/14 1138  BP: 151/60  Pulse: 82  Temp: 36.7 C  Resp: 16    Post-op Vital Signs: stable   Complications: No apparent anesthesia complications

## 2014-12-14 NOTE — Interval H&P Note (Signed)
History and Physical Interval Note:  12/14/2014 7:29 AM  [name]  has presented today for surgery, with the diagnosis of BLADDER CANCER   The various methods of treatment have been discussed with the patient and family. After consideration of risks, benefits and other options for treatment, the patient has consented to  Procedure(s): [place] (Bilateral) as a surgical intervention .  The patient's history has been reviewed, patient examined, no change in status, stable for surgery.  I have reviewed the patient's chart and labs.  Questions were answered to the patient's satisfaction.     Theressa Piedra

## 2014-12-14 NOTE — Anesthesia Preprocedure Evaluation (Signed)
Anesthesia Evaluation  Patient identified by MRN, date of birth, ID band Patient awake    Reviewed: Allergy & Precautions, H&P , NPO status , Patient's Chart, lab work & pertinent test results  Airway Mallampati: II  TM Distance: >3 FB Neck ROM: full    Dental no notable dental hx. (+) Teeth Intact, Dental Advisory Given   Pulmonary neg pulmonary ROS, sleep apnea , former smoker,  breath sounds clear to auscultation  Pulmonary exam normal       Cardiovascular Exercise Tolerance: Good hypertension, Pt. on medications negative cardio ROS Normal cardiovascular examRhythm:regular Rate:Normal     Neuro/Psych Depression Parkinson's disease  Neuromuscular disease negative neurological ROS  negative psych ROS   GI/Hepatic negative GI ROS, Neg liver ROS,   Endo/Other  negative endocrine ROS  Renal/GU negative Renal ROS  negative genitourinary   Musculoskeletal   Abdominal   Peds  Hematology negative hematology ROS (+)   Anesthesia Other Findings   Reproductive/Obstetrics negative OB ROS                             Anesthesia Physical  Anesthesia Plan  ASA: III  Anesthesia Plan: General   Post-op Pain Management:    Induction: Intravenous  Airway Management Planned: LMA  Additional Equipment:   Intra-op Plan:   Post-operative Plan:   Informed Consent: I have reviewed the patients History and Physical, chart, labs and discussed the procedure including the risks, benefits and alternatives for the proposed anesthesia with the patient or authorized representative who has indicated his/her understanding and acceptance.   Dental Advisory Given  Plan Discussed with: CRNA and Surgeon  Anesthesia Plan Comments:         Anesthesia Quick Evaluation

## 2014-12-14 NOTE — Anesthesia Procedure Notes (Signed)
Procedure Name: LMA Insertion Date/Time: 12/14/2014 7:43 AM Performed by: Denna Haggard D Pre-anesthesia Checklist: Patient identified, Emergency Drugs available, Suction available and Patient being monitored Patient Re-evaluated:Patient Re-evaluated prior to inductionOxygen Delivery Method: Circle System Utilized Preoxygenation: Pre-oxygenation with 100% oxygen Intubation Type: IV induction Ventilation: Mask ventilation without difficulty LMA: LMA inserted LMA Size: 4.0 Number of attempts: 1 Airway Equipment and Method: Bite block Placement Confirmation: positive ETCO2 Tube secured with: Tape Dental Injury: Teeth and Oropharynx as per pre-operative assessment

## 2014-12-14 NOTE — Op Note (Addendum)
Preoperative diagnosis: Urothelial carcinoma, elevated PSA, BPH Postoperative diagnosis: urothelial carcinoma, elevated PSA, BPH, urethral stricture  Procedure: Exam under anesthesia, cystoscopy bladder biopsy and fulguration (2 - 5 cm), Bilateral retrograde pyelogram  Surgeon: Junious Silk  Anesthesia: Gen.  Indication for procedure: Patient is a 81 year old male with history of low-grade urothelial carcinoma and evidence of early recurrence on cystoscopy. He also had a history of elevated PSA. He is brought today for the above procedures.  Findings: On exam under anesthesia the penis was circumcised without mass or lesion. The testicles were descended bilaterally and palpably normal. On digital rectal exam the prostate was mildly enlarged but smooth without hard area or nodule. There were no palpable bladder masses.   On cystoscopy there was a wide caliber stricture in the bulbar urethra which was dilated with the scope. The prostatic urethra showed an enlarged median lobe was some obstruction. The trigone and  right ureteral orifice was in its normal orthotopic position with clear efflux. The left ureteral orifice  was wider and more lateral from the prior resection and also had appeared to be early low-grade recurrence along the posterior wall of the bladder going in to the ureteral orifice. There were no stones or foreign bodies in the bladder. The bladder mucosa contain four early papillary recurrences at the dome and it was otherwise unremarkable.  Left retrograde pyelogram-this outlined a single ureter single collecting system unit without filling defect stricture or dilation. There was excellent drainage.  Right retrograde pyelogram-this outlined a single ureter single collecting system unit without filling defect stricture dilation. There was excellent drainage.  Description of procedure: After consent was obtained patient brought to the operating room. After adequate anesthesia he is  placed in lithotomy position and prepped and draped in the usual sterile fashion. A timeout was performed to confirm the patient and procedure. An exam under anesthesia was performed. The cystoscope was passed per urethra the bladder carefully inspected with the 30 and 70 lens. There was a wide caliber stricture in the bulbar urethra which was gently dilated with the scope. The tumors at the dome biopsied with the flexible biopsy forceps and fulgurated with the [place].  Then approached the left ureteral orifice. When the ureter would efflux it was evident the abnormality was just inside the ureteral orifice and then I could see normal mucosa. Therefore passed a sensor wire tip 10 the ureteral orifice up and I could see the edge of the abnormality. I then took the flexible biopsy forceps and biopsied the bladder or distal extent of this lesion. I then took the [place] and used a cut current to ablate the urothelial abnormality. This appeared to ablate the full extent of the lesion.   Then slipped a 5 [place] open-ended catheter over the wire and pulled the wire out. Retrograde injection of contrast was performed. There was brisk drainage.  A cone-tipped catheter was used to cannulate the right ureteral orifice and retrograde injection of contrast was performed. Scout and spot imaging was obtained on the left and right side.  Having biopsied and ablated all the urothelial abnormalities there was excellent hemostasis at low-pressure. There was clear efflux and good drainage from the left side therefore I decided not to leave a stent. I was careful to only ablate the posterior wall of the ureteral orifice and into the bladder. The bladder was refilled and the scope removed. An 64 French Foley was placed and left gravity drainage. Urine was clear.  Postoperative mitomycin C in PACU: In  the PACU the bladder was instilled with 40 mg of mitomycin C in 40 mL of irrigation. Mitomycin was left indwelling for one hour  and the drained.  Complications: None Blood loss: Minimal  Specimens: #1 anterior dome tumor #2 posterior dome tumor #3 left ureteral orifice Specimens to pathology.  Drains: 18 French Foley catheter  Disposition: Patient stable to PACU

## 2014-12-17 ENCOUNTER — Encounter (HOSPITAL_BASED_OUTPATIENT_CLINIC_OR_DEPARTMENT_OTHER): Payer: Self-pay | Admitting: Urology

## 2015-01-10 ENCOUNTER — Ambulatory Visit: Payer: Medicare Other | Admitting: Occupational Therapy

## 2015-01-10 ENCOUNTER — Ambulatory Visit: Payer: Medicare Other | Admitting: Speech Pathology

## 2015-01-14 ENCOUNTER — Encounter: Payer: Self-pay | Admitting: Occupational Therapy

## 2015-01-14 ENCOUNTER — Ambulatory Visit: Payer: Medicare Other | Attending: Neurology | Admitting: Occupational Therapy

## 2015-01-14 ENCOUNTER — Ambulatory Visit: Payer: Medicare Other | Admitting: Speech Pathology

## 2015-01-14 DIAGNOSIS — R269 Unspecified abnormalities of gait and mobility: Secondary | ICD-10-CM | POA: Insufficient documentation

## 2015-01-14 DIAGNOSIS — R258 Other abnormal involuntary movements: Secondary | ICD-10-CM | POA: Diagnosis present

## 2015-01-14 DIAGNOSIS — R2681 Unsteadiness on feet: Secondary | ICD-10-CM | POA: Insufficient documentation

## 2015-01-14 DIAGNOSIS — R293 Abnormal posture: Secondary | ICD-10-CM | POA: Insufficient documentation

## 2015-01-14 DIAGNOSIS — R278 Other lack of coordination: Secondary | ICD-10-CM

## 2015-01-14 DIAGNOSIS — R279 Unspecified lack of coordination: Secondary | ICD-10-CM | POA: Insufficient documentation

## 2015-01-14 DIAGNOSIS — G20 Parkinson's disease: Secondary | ICD-10-CM

## 2015-01-14 DIAGNOSIS — R490 Dysphonia: Secondary | ICD-10-CM | POA: Insufficient documentation

## 2015-01-14 DIAGNOSIS — R4189 Other symptoms and signs involving cognitive functions and awareness: Secondary | ICD-10-CM | POA: Insufficient documentation

## 2015-01-14 NOTE — Therapy (Signed)
[facility] [address] [place], [place], [address] Phone: [phone]   Fax:  [phone]  Occupational Therapy Evaluation  Patient Details  Name: [name] MRN: [id number] Date of Birth: 1934-04-16 Referring Provider:  Ludwig Clarks, DO  Encounter Date: 01/14/2015      OT End of Session - 01/14/15 1410    Visit Number 1   Number of Visits 17   Date for OT Re-Evaluation 03/15/15   Authorization Type Blue [employer], no visit limit, no auth required, g-code   Authorization - Visit Number 1   Authorization - Number of Visits 10   OT Start Time 1018   OT Stop Time 1106   OT Time Calculation (min) 48 min   Activity Tolerance Patient tolerated treatment well   Behavior During Therapy Flat affect      Past Medical History  Diagnosis Date  . Hyperlipidemia   . Hypertension   . Parkinson disease DX   2005    NEUROLOGIST-   DR TAT--  IDIOPATHIC PARKINSON'S /  TREMORS CONTROLLED WITH MEDS  . GERD (gastroesophageal reflux disease)   . Allergic rhinitis   . OAB (overactive bladder)   . Frequency of urination   . Urge urinary incontinence   . Nocturia   . BPH with elevated PSA   . Osteoarthritis     KNEES, SHOULDER  . DDD (degenerative disc disease), lumbar   . Hearing loss     does not wear his hearing aid  . Mild obstructive sleep apnea     PER PT  MILD OSA , NO CPAP RX,  STUDY DONE 2009  . Depression   . Bladder cancer     urologist-  dr eskridge--  low grade TA  . Dry mouth     USES BIOTIN SPRAY/ MOUTHWASH    Past Surgical History  Procedure Laterality Date  . Nasal sinus surgery  1982  . Inguinal hernia repair  03/09/2012    Procedure: HERNIA REPAIR INGUINAL ADULT;  Surgeon: Adin Hector, MD;  Location: [facility];  Service: General;  Laterality: Right;  . Insertion of mesh  03/09/2012    Procedure: INSERTION OF MESH;  Surgeon: Adin Hector, MD;  Location: [facility];  Service: General;  Laterality:  Right;  . Removal benign cyst left forehead  1969  . Orif right arm fx  1949    HARDWARE REMOVED   . Cataract extraction w/ intraocular lens  implant, bilateral  2014  . Cystoscopy with stent placement Left 12/19/2013    Procedure: CYSTOSCOPY BLADDER BX, LEFT URETERAL STENT PLACEMENT , LEFT RETROGRADE AND PYLOGRAM;  Surgeon: Festus Aloe, MD;  Location: [facility];  Service: Urology;  Laterality: Left;  . Transurethral resection of bladder tumor N/A 12/19/2013    Procedure:  TRANSURETHRAL RESECTION OF BLADDER TUMOR WITH GYRUS (TURBT-GYRUS);  Surgeon: Festus Aloe, MD;  Location: [facility];  Service: Urology;  Laterality: N/A;  . Cystoscopy w/ retrogrades Bilateral 12/14/2014    Procedure: CYSTOSCOPY BLADDER BIOPSY FULGERATION MITOMYCIN C BILATERAL RETROGRADE PYELOGRAM;  Surgeon: Festus Aloe, MD;  Location: [facility];  Service: Urology;  Laterality: Bilateral;    There were no vitals filed for this visit.  Visit Diagnosis:  Bradykinesia  Cognitive deficits  Decreased coordination  Posture abnormality  Unsteadiness      Subjective Assessment - 01/14/15 1026    Subjective  "I just am more cautious.  I just don't want watch the clock.  (  to take meds on time).  I just don't have time to exercise."   Pertinent History Parkinson's disease    Patient Stated Goals unsure   Currently in Pain? Yes   Pain Score 3    Pain Location Back   Pain Descriptors / Indicators Dull   Pain Type Chronic pain   Aggravating Factors  continued activity, getting up and down off the floor   Pain Relieving Factors rest, pain meds   Effect of Pain on Daily Activities limits activity.  OT will monitor as relates to therapy, but will not directly address due to chronic nature/medically managed.           [facility] OT Assessment - 01/14/15 0001    Assessment   Diagnosis Parkinson's disease   Onset Date --  2005   Prior Therapy ? 2012 OT/PT    Precautions   Precautions Fall   Balance Screen   Has the patient fallen in the past 6 months Yes   How many times? --  1 fall on stairs, other falls in the past   Home  Environment   Family/patient expects to be discharged to: Private residence   Lives With [name]   Level of Independence Independent with basic ADLs;Independent with homemaking with ambulation   Leisure Member of [employer], but not going much   ADL   Eating/Feeding --  dfficulty with manipulating utensils, cutting food, spills   Grooming --  incr time, diffiuclty trimming toenails   Upper Body Bathing Modified independent  incr time   Lower Body Bathing Modified independent  incr time   Upper Body Dressing Increased time  difficulty with fasteners, donning jacket, pulling shirt   Lower Body Dressing Increased time  difficulty with belt, difficulty with fasteners   Toilet Tranfer --  difficulty, slightly higher toilet seat   Where Assessed - Toileting Clothing Manipulationn --  difficulty   Tub/Shower Transfer Modified independent  suction cup grap bar   Transfers/Ambulation Related to ADL's difficulty with transfers, getting up from chair without arms   IADL   Shopping --  wife goes with pt, difficulty looking for items   Light Housekeeping --  performs light activities, helps take care of [place] own vehicle   Medication Management --  pt reports that he never remembers to take on time   Mobility   Mobility Status History of falls   Mobility Status Comments difficulty carrying items, ambulates without device.  Posture:  rounded shoulders, leans   Written Expression   Dominant Hand Left   Handwriting 100% legible  today, but pt reports difficulty if not on table   Vision - History   Baseline Vision Wears glasses only for reading   Visual History Cataracts  surgery   Patient Visual Report --  diplopia intermittently   Additional Comments --   Activity Tolerance    Activity Tolerance Comments pt reports fatigue and back pain limiting activity   Cognition   Overall Cognitive Status Cognition to be further assessed in functional context PRN   Attention --  needs min redirection at times   Memory Impaired   Memory Impairment Decreased short term memory   Decreased Short Term Memory --  difficulty with meds   Awareness Impaired   Awareness Impairment Emergent impairment;Intellectual impairment  decr awareness of PD-deficts and severity   Bradyphrenia Yes  difficulty locating items in grocery store   Observation/Other Assessments   Standing Functional Reach Test  R-9", L-10"   Other Surveys  Select   Physical Performance Test   Yes   Simulated Eating Time (seconds) 12.47   Donning Doffing Jacket Time (seconds) 24.77   Coordination   9 Hole Peg Test Right;Left   Right 9 Hole Peg Test 31.91   Left 9 Hole Peg Test 44.63   Box and Blocks unable to test due to time constraints   Tremors none observed during eval, pt reports that it does not appear to be affecting function   Perception   Perception Impaired   Praxis   Praxis --   Tone   Assessment Location Right Upper Extremity;Left Upper Extremity   ROM / Strength   AROM / PROM / Strength AROM   AROM   Overall AROM  Within functional limits for tasks performed  mild decr R elbow extension (old injury)   Overall AROM Comments min cues needed for full shoulder ROM with elbow ext due to bradykinesia   RUE Tone   RUE Tone Within Functional Limits   LUE Tone   LUE Tone Within Functional Limits                         OT Education - 01/14/15 1342    Education provided Yes   Education Details Importance of regular exercise and PD-specific exercise that can improve ease with ADLs.  Importance of taking medications regularly.  Recommended discussing diplopia and ?prisms with eye doctor.   Person(s) Educated Patient   Methods Explanation   Comprehension Verbalized understanding           OT Short Term Goals - 01/14/15 1419    OT SHORT TERM GOAL #1   Title Pt will be independent with initial PD-specific HEP.--check STGs 02/13/15   Time 4   Period Weeks   Status New   OT SHORT TERM GOAL #2   Title Pt will improve coordination for ADLs as shown by improving time on 9-hole peg test by at least 5 sec with dominant LUE.   Baseline 44.63sec   Time 4   Period Weeks   Status New   OT SHORT TERM GOAL #3   Title Pt will report incr ease with eating using adaptive strategies prn.   Time 4   Period Weeks   Status New   OT SHORT TERM GOAL #4   Title Pt will verbalize understanding of ways to prevent future complications and PD-related community resources.   Time 4   Period Weeks   Status New   OT SHORT TERM GOAL #5   Title Pt will verbalize understanding of cognitive compensation strategies/ways to promote cognitive skills prn.   Time 8   Period Weeks   Status New           OT Long Term Goals - 01/14/15 1430    OT LONG TERM GOAL #1   Title Pt will verbalize understanding of AE/strategies to increase ease/safety with ADLs/IADLs prn.   Time 8   Period Weeks   Status New   OT LONG TERM GOAL #2   Title Pt will improve coordination for ADLs as shown by improving time on 9-hole peg test by at least 10 sec with dominant LUE.   Baseline L-44.63sec   Time 8   Period Weeks   Status New   OT LONG TERM GOAL #3   Title Assess Box and blocks test for functional reaching/coordination and set goal as appropriate.   Time 8  Period Weeks   Status New   OT LONG TERM GOAL #4   Title Pt will report incr ease with buttons, zippers, tying, belt.   Time 4   Period Days   Status New   OT LONG TERM GOAL #5   Title Pt will improve dressing ease as shown by improving time on PPT#4 by at least 5sec.   Baseline 24.77   Time 8   Period Weeks   Status New   Long Term Additional Goals   Additional Long Term Goals Yes   OT LONG TERM GOAL #6   Title Pt will improve  balance/functional reaching for ADLs as shown by improving standing functional reach test to at least 11inches with each UE.   Baseline R-9", L-10"   Time 8   Period Weeks   Status New               Plan - 02-06-15 1412    Clinical Impression Statement Pt is a 81 y.o. male with hx of PD x10 years.  Pt reports that he has not been regularly exercising and reports overall decline since last time he was seen by occupational therapy (approx 3-4 years ago).  Pt is known to this therapist.  Pt presents with bradykinesia, decreased coordination, posture abnormality, cognitive deficits, and decreased balance  which affects ADLs/IADL performance.  Pt would benefit from occupational therapy to establish PD-specific HEP, prevent future complications, incr ease/performance of ADLs/IADLs and improve quality of life.   Pt will benefit from skilled therapeutic intervention in order to improve on the following deficits (Retired) Decreased cognition;Decreased mobility;Impaired perceived functional ability;Impaired vision/preception;Impaired UE functional use;Decreased knowledge of use of DME;Decreased balance;Decreased activity tolerance;Decreased coordination   Rehab Potential Good   Clinical Impairments Affecting Rehab Potential decr awareness of PD deficits, affect, cognitive deficits   OT Frequency 2x / week   OT Duration 8 weeks  +eval   OT Treatment/Interventions Self-care/ADL training;Cryotherapy;Moist Heat;Visual/perceptual remediation/compensation;Cognitive remediation/compensation;DME and/or AE instruction;Therapeutic activities;Therapeutic exercises;Energy conservation;Neuromuscular education;Manual Therapy;Splinting;Functional Mobility Training;Patient/family education;Therapeutic exercise   Plan box and blocks test, initiate supine PWR! HEP   Recommended Other Services scheduled for ST and PT evals   Consulted and Agree with Plan of Care Patient          G-Codes - 02-06-2015 1439     Functional Assessment Tool Used L 9-hole peg test:  44.63sec, PPT#4 24.77sec, difficulty with fasteners and eating, not taking meds on time, not currently exercising regularly   Functional Limitation Self care   Self Care Current Status (H9622) At least 20 percent but less than 40 percent impaired, limited or restricted   Self Care Goal Status (W9798) At least 1 percent but less than 20 percent impaired, limited or restricted      Problem List Patient Active Problem List   Diagnosis Date Noted  . Bladder cancer 04/26/2014  . Right inguinal hernia 09/10/2011  . Direct inguinal hernia 08/07/2011  . Urinary frequency 03/18/2011  . Skin lesion 03/18/2011  . General medical examination 03/18/2011  . Cerumen impaction 12/04/2010  . Nail fungus 10/14/2010  . Fatigue 10/14/2010  . RHINITIS 02/12/2010  . GERD 12/03/2009  . Depression 08/28/2009  . WEIGHT LOSS 08/28/2009  . PSA, INCREASED 06/11/2009  . BACK PAIN 01/09/2009  . NEOPLASM OF UNCERTAIN BEHAVIOR OF SKIN 07/05/2008  . PARKINSON'S DISEASE 05/13/2007  . Hyperlipidemia 02/28/2007  . ERECTILE DYSFUNCTION 02/28/2007  . Essential hypertension 02/28/2007  . OSTEOARTHRITIS 02/28/2007    FREEMAN,ANGELA Feb 06, 2015, 2:43 PM  [facility] [address] [place], [place], [address] Phone: [phone]   Fax:  [place], OTR/L 01/14/2015 2:43 PM

## 2015-01-14 NOTE — Therapy (Signed)
[facility] [address] [place], [place], [address] Phone: [phone]   Fax:  [phone]  Speech Language Pathology Evaluation  Patient Details  Name: [name] MRN: [id number] Date of Birth: 05-13-1933 Referring Provider:  Ludwig Clarks, DO  Encounter Date: 01/14/2015      End of Session - 01/14/15 1157    Visit Number 1   Number of Visits 16   Date for SLP Re-Evaluation 03/11/15   Authorization Type Blue medicare   Authorization Time Period Notes from [place] state no auth required for ST   SLP Start Time 1104   SLP Stop Time  1150   SLP Time Calculation (min) 46 min   Activity Tolerance Patient tolerated treatment well      Past Medical History  Diagnosis Date  . Hyperlipidemia   . Hypertension   . Parkinson disease DX   2005    NEUROLOGIST-   DR TAT--  IDIOPATHIC PARKINSON'S /  TREMORS CONTROLLED WITH MEDS  . GERD (gastroesophageal reflux disease)   . Allergic rhinitis   . OAB (overactive bladder)   . Frequency of urination   . Urge urinary incontinence   . Nocturia   . BPH with elevated PSA   . Osteoarthritis     KNEES, SHOULDER  . DDD (degenerative disc disease), lumbar   . Hearing loss     does not wear his hearing aid  . Mild obstructive sleep apnea     PER PT  MILD OSA , NO CPAP RX,  STUDY DONE 2009  . Depression   . Bladder cancer     urologist-  dr eskridge--  low grade TA  . Dry mouth     USES BIOTIN SPRAY/ MOUTHWASH    Past Surgical History  Procedure Laterality Date  . Nasal sinus surgery  1982  . Inguinal hernia repair  03/09/2012    Procedure: HERNIA REPAIR INGUINAL ADULT;  Surgeon: Adin Hector, MD;  Location: [facility];  Service: General;  Laterality: Right;  . Insertion of mesh  03/09/2012    Procedure: INSERTION OF MESH;  Surgeon: Adin Hector, MD;  Location: [facility];  Service: General;  Laterality: Right;  . Removal benign cyst left forehead  1969  . Orif right arm  fx  1949    HARDWARE REMOVED   . Cataract extraction w/ intraocular lens  implant, bilateral  2014  . Cystoscopy with stent placement Left 12/19/2013    Procedure: CYSTOSCOPY BLADDER BX, LEFT URETERAL STENT PLACEMENT , LEFT RETROGRADE AND PYLOGRAM;  Surgeon: Festus Aloe, MD;  [facility];  Service: Urology;  Laterality: Left;  . Transurethral resection of bladder tumor N/A 12/19/2013    Procedure:  TRANSURETHRAL RESECTION OF BLADDER TUMOR WITH GYRUS (TURBT-GYRUS);  Surgeon: Festus Aloe, MD;  Location: [facility];  Service: Urology;  Laterality: N/A;  . Cystoscopy w/ retrogrades Bilateral 12/14/2014    Procedure: CYSTOSCOPY BLADDER BIOPSY FULGERATION MITOMYCIN C BILATERAL RETROGRADE PYELOGRAM;  Surgeon: Festus Aloe, MD;  Location: [facility];  Service: Urology;  Laterality: Bilateral;    There were no vitals filed for this visit.  Visit Diagnosis: Hypokinetic Parkinsonian dysphonia      Subjective Assessment - 01/14/15 1115    Subjective "My wife says my voice sounds different that it used to"   Currently in Pain? Yes   Pain Score 3    Pain Location Back   Pain Orientation Right   Pain Descriptors /  Indicators Dull   Pain Type Chronic pain            SLP Evaluation [facility] - 01/14/15 1115    SLP Visit Information   SLP Received On 01/14/15   Onset Date 10 years ago   Medical Diagnosis Parkinson's disease   General Information   Other Pertinent Information MBSS 09/2013 recommended regular solids, thin liquids   Behavioral/Cognition MCI   Mobility Status Walks indpendently   Prior Functional Status   Cognitive/Linguistic Baseline Baseline deficits   Baseline deficit details MCI   Type of Home House    Lives With Significant other   Available Support Family   Vocation Retired   Retail buyer   Overall Motor Speech Impaired   Phonation Low vocal intensity   Resonance Within functional limits   Motor Planning  Witnin functional limits   Phonation Impaired   Volume Soft;Decibel Level  67dB at conversation level                      ADULT SLP TREATMENT - 01/14/15 1131    Cognitive-Linquistic Treatment   Skilled Treatment Initiated training in loud "AH" with occasional min verbal cues for big breath. Initiated training  in compensations for dysarthria with usual mod A.              SLP Short Term Goals - 01/14/15 1203    SLP SHORT TERM GOAL #1   Title Pt will demonsrate loud /a/ average  of 90dB over 3 sessions with rare min A   Time 4   Period Weeks   Status New   SLP SHORT TERM GOAL #2   Title Pt will average 70dB for structured speech tasks with occasional min A   Time 4   Period Weeks   Status New          SLP Long Term Goals - 01/14/15 1205    SLP LONG TERM GOAL #1   Title Pt will maintain average of 70dB during conversation over 10 minutes with occasional min A   Time 8   Period Weeks   Status New   SLP LONG TERM GOAL #2   Title Pt will be audible at conversation level over 12 minuts in noisy environment with occasional min A   Time 8   Period Weeks   Status New   SLP LONG TERM GOAL #3   Title Tolerate regular diet/thin liquids with general swallow precautions with rare min A per pt report over 2 sessions.    Time 8   Period Weeks   Status New          Plan - 01/14/15 1203    Clinical Impression Statement [name] is a 81 y.o. male who was diagnosed with Parkinson's Disease 10 years ago. 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 He reports his wife has difficulty hearing him at home. [name] states he does not talk on the phone and prefers texting. 15 minutes of conversational speech was reduced today, at average 67dB (WNL= average 70-72dB) with range of 65 to 70dB, when a sound level meter was placed 30 cm away from pt's mouth. Overall intelligibility for this listener in a quiet environment was approx 75%. Production of loud /a/  averaged 87dB (range of 85 to 90) and usua cues min needed for loudness.   Oral motor assessment revealed WFL lingual ROM and WFL lingual strength. Labial ROM was [facility] and strength was [facility]. Velar ROM appeared [facility].  Pt does c/o xerostomia affecting his daily activities. He also reports difficulty swallowing, feeling food "getting backed up" in his throat. MBSS 09/2013 was WNL. Pt denies pna.   Pt would benefit from skilled ST in order to improve speech intelligibility and pt's QOL.           G-Codes - 02/08/15 1145    Functional Assessment Tool Used noms   Functional Limitations Motor speech   Motor Speech Current Status [phone]) At least 20 percent but less than 40 percent impaired, limited or restricted   Motor Speech Goal Status (V8938) At least 1 percent but less than 20 percent impaired, limited or restricted      Problem List Patient Active Problem List   Diagnosis Date Noted  . Bladder cancer 04/26/2014  . Right inguinal hernia 09/10/2011  . Direct inguinal hernia 08/07/2011  . Urinary frequency 03/18/2011  . Skin lesion 03/18/2011  . General medical examination 03/18/2011  . Cerumen impaction 12/04/2010  . Nail fungus 10/14/2010  . Fatigue 10/14/2010  . RHINITIS 02/12/2010  . GERD 12/03/2009  . Depression 08/28/2009  . WEIGHT LOSS 08/28/2009  . PSA, INCREASED 06/11/2009  . BACK PAIN 01/09/2009  . NEOPLASM OF UNCERTAIN BEHAVIOR OF SKIN 07/05/2008  . PARKINSON'S DISEASE 05/13/2007  . Hyperlipidemia 02/28/2007  . ERECTILE DYSFUNCTION 02/28/2007  . Essential hypertension 02/28/2007  . OSTEOARTHRITIS 02/28/2007    Lovvorn, Annye Rusk MS, CCC-SLP 02/08/2015, 12:08 PM  [facility] [address] [place], [place], [address] Phone: [phone]   Fax:  [phone]

## 2015-01-14 NOTE — Patient Instructions (Signed)
  Big Breath Loud AH!!   Twice a Day - 5x   Give your voice power

## 2015-01-16 ENCOUNTER — Ambulatory Visit: Payer: Medicare Other | Admitting: Physical Therapy

## 2015-01-16 DIAGNOSIS — R293 Abnormal posture: Secondary | ICD-10-CM

## 2015-01-16 DIAGNOSIS — R258 Other abnormal involuntary movements: Secondary | ICD-10-CM

## 2015-01-16 DIAGNOSIS — R269 Unspecified abnormalities of gait and mobility: Secondary | ICD-10-CM

## 2015-01-16 NOTE — Therapy (Signed)
[facility] [address] [place], [place], [address] Phone: [phone]   Fax:  [phone]  Physical Therapy Evaluation  Patient Details  Name: [name] MRN: [id number] Date of Birth: 02/05/1934 Referring Gurshaan Matsuoka:  Ludwig Clarks, DO  Encounter Date: 01/16/2015      PT End of Session - 01/16/15 1108    Visit Number 1   Number of Visits 17   Date for PT Re-Evaluation 03/17/15   Authorization Type Blue Medicare-G-code every 10th visit   PT Start Time 1025   PT Stop Time 1103   PT Time Calculation (min) 38 min   Activity Tolerance Patient tolerated treatment well   Behavior During Therapy [facility] for tasks assessed/performed      Past Medical History  Diagnosis Date  . Hyperlipidemia   . Hypertension   . Parkinson disease DX   2005    NEUROLOGIST-   DR TAT--  IDIOPATHIC PARKINSON'S /  TREMORS CONTROLLED WITH MEDS  . GERD (gastroesophageal reflux disease)   . Allergic rhinitis   . OAB (overactive bladder)   . Frequency of urination   . Urge urinary incontinence   . Nocturia   . BPH with elevated PSA   . Osteoarthritis     KNEES, SHOULDER  . DDD (degenerative disc disease), lumbar   . Hearing loss     does not wear his hearing aid  . Mild obstructive sleep apnea     PER PT  MILD OSA , NO CPAP RX,  STUDY DONE 2009  . Depression   . Bladder cancer     urologist-  dr eskridge--  low grade TA  . Dry mouth     USES BIOTIN SPRAY/ MOUTHWASH    Past Surgical History  Procedure Laterality Date  . Nasal sinus surgery  1982  . Inguinal hernia repair  03/09/2012    Procedure: HERNIA REPAIR INGUINAL ADULT;  Surgeon: Adin Hector, MD;  Location: [facility];  Service: General;  Laterality: Right;  . Insertion of mesh  03/09/2012    Procedure: INSERTION OF MESH;  Surgeon: Adin Hector, MD;  Location: [facility];  Service: General;  Laterality: Right;  . Removal benign cyst left forehead  1969  . Orif right arm fx   1949    HARDWARE REMOVED   . Cataract extraction w/ intraocular lens  implant, bilateral  2014  . Cystoscopy with stent placement Left 12/19/2013    Procedure: CYSTOSCOPY BLADDER BX, LEFT URETERAL STENT PLACEMENT , LEFT RETROGRADE AND PYLOGRAM;  Surgeon: Festus Aloe, MD;  Location: [facility];  Service: Urology;  Laterality: Left;  . Transurethral resection of bladder tumor N/A 12/19/2013    Procedure:  TRANSURETHRAL RESECTION OF BLADDER TUMOR WITH GYRUS (TURBT-GYRUS);  Surgeon: Festus Aloe, MD;  Location: [facility];  Service: Urology;  Laterality: N/A;  . Cystoscopy w/ retrogrades Bilateral 12/14/2014    Procedure: CYSTOSCOPY BLADDER BIOPSY FULGERATION MITOMYCIN C BILATERAL RETROGRADE PYELOGRAM;  Surgeon: Festus Aloe, MD;  Location: [facility];  Service: Urology;  Laterality: Bilateral;    There were no vitals filed for this visit.  Visit Diagnosis:  Abnormality of gait  Bradykinesia  Posture abnormality      Subjective Assessment - 01/16/15 1029    Subjective Pt is a 81 year old male with Parkinson's disease, who reports some "downhill" changes in his Parkisnon's since he was in therapy several years ago.  Pt reports having one fall in the past  6 months, which happened in the garage.  He does not use assistive device.   Patient Stated Goals [name] goal for therapy is to improve balance and mobility.   Currently in Pain? No/denies            [facility] PT Assessment - 01/16/15 1035    Assessment   Medical Diagnosis Parkinson's disease   Precautions   Precautions Fall   Balance Screen   Has the patient fallen in the past 6 months Yes   How many times? 1   Has the patient had a decrease in activity level because of a fear of falling?  Yes   Is the patient reluctant to leave their home because of a fear of falling?  Yes   [place] Private residence   Living Arrangements Spouse/significant other    Available Help at Discharge Family   Type of [employer] to enter   Entrance Stairs-Number of Steps 5   Entrance Stairs-Rails Right   [place] Two level;Bed/bath upstairs   [place] - single point  walking sticks   Prior Function   Level of Independence Independent with gait;Independent with transfers;Independent with basic ADLs   Leisure Member of [employer], but not going much   Posture/Postural Control   Posture/Postural Control Postural limitations   Postural Limitations Rounded Shoulders;Forward head;Weight shift left  Leans to left in sitting   ROM / Strength   AROM / PROM / Strength Strength   Strength   Overall Strength Comments grossly tested at least 4/5 bilateral lower extremities   Transfers   Transfers Sit to Stand;Stand to Sit   Sit to Stand 6: Modified independent (Device/Increase time);Without upper extremity assist;From chair/3-in-1  reports he needs UE support from recliner at home   Five time sit to stand comments  22.80  places R foot very far under chair, pushed from toes   Stand to Sit 6: Modified independent (Device/Increase time);Without upper extremity assist;To chair/3-in-1   Ambulation/Gait   Ambulation/Gait Yes   Ambulation/Gait Assistance 5: Supervision   Ambulation Distance (Feet) 150 Feet   Assistive device None   Gait Pattern Step-through pattern;Decreased arm swing - left;Decreased step length - left;Decreased dorsiflexion - left;Decreased trunk rotation;Trunk flexed;Narrow base of support;Poor foot clearance - left   Ambulation Surface Level;Indoor   Gait velocity 13.10= 2.5 ft/sec   Standardized Balance Assessment   Standardized Balance Assessment Timed Up and Go Test   Timed Up and Go Test   Normal TUG (seconds) 14.55   Manual TUG (seconds) 14.76   Cognitive TUG (seconds) 18.88   Functional Gait  Assessment   Gait assessed  Yes   Gait Level Surface Walks 20 ft, slow speed, abnormal gait pattern, evidence for  imbalance or deviates 10-15 in outside of the 12 in walkway width. Requires more than 7 sec to ambulate 20 ft.  8.63   Change in Gait Speed Able to change speed, demonstrates mild gait deviations, deviates 6-10 in outside of the 12 in walkway width, or no gait deviations, unable to achieve a major change in velocity, or uses a change in velocity, or uses an assistive device.   Gait with Horizontal Head Turns Performs head turns smoothly with slight change in gait velocity (eg, minor disruption to smooth gait path), deviates 6-10 in outside 12 in walkway width, or uses an assistive device.   Gait with Vertical Head Turns Performs task with slight change in gait velocity (  eg, minor disruption to smooth gait path), deviates 6 - 10 in outside 12 in walkway width or uses assistive device   Gait and Pivot Turn Pivot turns safely in greater than 3 sec and stops with no loss of balance, or pivot turns safely within 3 sec and stops with mild imbalance, requires small steps to catch balance.  3.14 pivot turns, decr. foot clearance   Step Over Obstacle Is able to step over one shoe box (4.5 in total height) but must slow down and adjust steps to clear box safely. May require verbal cueing.   Gait with Narrow Base of Support Ambulates 7-9 steps.   Gait with Eyes Closed Cannot walk 20 ft without assistance, severe gait deviations or imbalance, deviates greater than 15 in outside 12 in walkway width or will not attempt task.  veers to R and has to stop test   Ambulating Backwards Walks 20 ft, uses assistive device, slower speed, mild gait deviations, deviates 6-10 in outside 12 in walkway width.  17.08 sec small steps   Steps Two feet to a stair, must use rail.   Total Score 15   FGA comment: Scores <15/30 indicate increased fall risk in people with Parkinson's disease; <22/30 indicate falls in community dwelling older adults                             PT Short Term Goals - 01/16/15 1123     PT SHORT TERM GOAL #1   Title Pt will perform HEP for improved functional mobility, gait, balance and transfers.  TARGET 02/14/15   Time 4   Period Weeks   Status New   PT SHORT TERM GOAL #2   Title Pt will improve 5x sit<>stand to less than or equal to 18 seconds for improved transfer efficiency and safety.   Time 4   Period Weeks   Status New   PT SHORT TERM GOAL #3   Title Pt will improve TUG score to less than or equal to 13.5 seconds for decreased fall risk.   Time 4   Period Weeks   Status New   PT SHORT TERM GOAL #4   Title Pt will improve gait velocity to at least 2.8 ft/sec for improved gait efficiency and safety.   Time 4   Period Weeks   Status New           PT Long Term Goals - 01/16/15 1125    PT LONG TERM GOAL #1   Title Pt will verbalize understanding of fall prevention in the home environment.   TARGET 03/17/15   Time 8   Period Weeks   Status New   PT LONG TERM GOAL #2   Title Pt will improve 5x sit<>stand to less than or equal to 15 seconds for improved efficiency and safety with transfers.   Time 8   Period Weeks   Status New   PT LONG TERM GOAL #3   Title Pt will improve TUG cognitive score to less than or equal to 15 seconds for decreased fall risk and improved ability for dual tasking.   Time 8   Period Weeks   Status New   PT LONG TERM GOAL #4   Title Pt will improve Functional Gait Assessment score to at least 20/30 for improved functional mobility and decreased fall risk.   Time 8   Period Weeks   Status New   PT LONG TERM GOAL #  5   Title Pt will verbalize plans for continue community fitness upon D/C from PT.   Time 8   Period Weeks   Status New               Plan - 02/13/2015 1109    Clinical Impression Statement Pt is a 81 year old male who presents to OP PT with history of Parkinson's disease >10 years.  He was seen in this clinic several years ago for therapy, and he feels that he is noticing a functional decline in the  past 6-12 months.  He notes that he has had one fall in the past 6 months, several falls over the past 2-3 years.  He is at fall risk per TUG scores and per Functional Gait Assessment scores.  He demonstrates decreased ability for dual tasking with gait on TUG cognitive test.  Pt presents with bradykinesia, postural abnormality, rigidity, decreased transfer efficiency, decreased timing and coordination with gait and turns, decreased balance.  Pt would benefit from skilled physical therapy to address the previously stated deficits.   Pt will benefit from skilled therapeutic intervention in order to improve on the following deficits Abnormal gait;Decreased balance;Decreased mobility;Decreased strength;Difficulty walking;Postural dysfunction   Rehab Potential Good   PT Frequency 2x / week   PT Duration 8 weeks  plus eval   PT Treatment/Interventions ADLs/Self Care Home Management;Therapeutic exercise;Therapeutic activities;Functional mobility training;Gait training;Balance training;Neuromuscular re-education;Patient/family education   PT Next Visit Plan Initiate PWR! Moves for HEP-sitting, standing; work on walking program for home, emphasis on large amplitude movements; transfer training and turning practice   Consulted and Agree with Plan of Care Patient          G-Codes - 13-Feb-2015 August 17, 1127    Functional Assessment Tool Used FGA 15/30; 5x sit<>stand 22.80; TUG 14.55 sec, TUG cog 18.88 seconds, gait velocity 2.5 ft/sec   Functional Limitation Mobility: Walking and moving around   Mobility: Walking and Moving Around Current Status [phone]) At least 20 percent but less than 40 percent impaired, limited or restricted   Mobility: Walking and Moving Around Goal Status [phone]) At least 1 percent but less than 20 percent impaired, limited or restricted       Problem List Patient Active Problem List   Diagnosis Date Noted  . Bladder cancer 04/26/2014  . Right inguinal hernia 09/10/2011  . Direct  inguinal hernia 08/07/2011  . Urinary frequency 03/18/2011  . Skin lesion 03/18/2011  . General medical examination 03/18/2011  . Cerumen impaction 12/04/2010  . Nail fungus 10/14/2010  . Fatigue 10/14/2010  . RHINITIS 02/12/2010  . GERD 12/03/2009  . Depression 08/28/2009  . WEIGHT LOSS 08/28/2009  . PSA, INCREASED 06/11/2009  . BACK PAIN 01/09/2009  . NEOPLASM OF UNCERTAIN BEHAVIOR OF SKIN 07/05/2008  . PARKINSON'S DISEASE 05/13/2007  . Hyperlipidemia 02/28/2007  . ERECTILE DYSFUNCTION 02/28/2007  . Essential hypertension 02/28/2007  . OSTEOARTHRITIS 02/28/2007    MARRIOTT,AMY W. February 13, 2015, 11:30 AM  Frazier Butt., PT [facility] [address] [place], [place], [address] Phone: [phone]   Fax:  [phone]

## 2015-01-21 ENCOUNTER — Ambulatory Visit: Payer: Medicare Other | Admitting: Occupational Therapy

## 2015-01-21 ENCOUNTER — Ambulatory Visit: Payer: Medicare Other | Attending: Neurology | Admitting: Physical Therapy

## 2015-01-21 DIAGNOSIS — R279 Unspecified lack of coordination: Secondary | ICD-10-CM

## 2015-01-21 DIAGNOSIS — R269 Unspecified abnormalities of gait and mobility: Secondary | ICD-10-CM | POA: Insufficient documentation

## 2015-01-21 DIAGNOSIS — R2681 Unsteadiness on feet: Secondary | ICD-10-CM

## 2015-01-21 DIAGNOSIS — R4189 Other symptoms and signs involving cognitive functions and awareness: Secondary | ICD-10-CM | POA: Insufficient documentation

## 2015-01-21 DIAGNOSIS — R293 Abnormal posture: Secondary | ICD-10-CM | POA: Diagnosis present

## 2015-01-21 DIAGNOSIS — R258 Other abnormal involuntary movements: Secondary | ICD-10-CM

## 2015-01-21 DIAGNOSIS — R278 Other lack of coordination: Secondary | ICD-10-CM

## 2015-01-21 DIAGNOSIS — R490 Dysphonia: Secondary | ICD-10-CM | POA: Diagnosis present

## 2015-01-21 NOTE — Therapy (Signed)
[facility] [address] [place], [place], [address] Phone: [phone]   Fax:  [phone]  Occupational Therapy Treatment  Patient Details  Name: [name] MRN: [id number] Date of Birth: 09-21-1933 Referring Provider:  Midge Minium, MD  Encounter Date: 01/21/2015      OT End of Session - 01/21/15 1655    Visit Number 2   Number of Visits 17   Date for OT Re-Evaluation 03/15/15   Authorization Type Blue Medicare, no visit limit, no auth required, g-code   Authorization - Visit Number 2   Authorization - Number of Visits 10   OT Start Time 8502   OT Stop Time 1445   OT Time Calculation (min) 42 min   Activity Tolerance Patient tolerated treatment well   Behavior During Therapy Flat affect      Past Medical History  Diagnosis Date  . Hyperlipidemia   . Hypertension   . Parkinson disease DX   2005    NEUROLOGIST-   DR TAT--  IDIOPATHIC PARKINSON'S /  TREMORS CONTROLLED WITH MEDS  . GERD (gastroesophageal reflux disease)   . Allergic rhinitis   . OAB (overactive bladder)   . Frequency of urination   . Urge urinary incontinence   . Nocturia   . BPH with elevated PSA   . Osteoarthritis     KNEES, SHOULDER  . DDD (degenerative disc disease), lumbar   . Hearing loss     does not wear his hearing aid  . Mild obstructive sleep apnea     PER PT  MILD OSA , NO CPAP RX,  STUDY DONE 2009  . Depression   . Bladder cancer     urologist-  dr eskridge--  low grade TA  . Dry mouth     USES BIOTIN SPRAY/ MOUTHWASH    Past Surgical History  Procedure Laterality Date  . Nasal sinus surgery  1982  . Inguinal hernia repair  03/09/2012    Procedure: HERNIA REPAIR INGUINAL ADULT;  Surgeon: Adin Hector, MD;  Location: [facility];  Service: General;  Laterality: Right;  . Insertion of mesh  03/09/2012    Procedure: INSERTION OF MESH;  Surgeon: Adin Hector, MD;  Location: [facility];  Service: General;   Laterality: Right;  . Removal benign cyst left forehead  1969  . Orif right arm fx  1949    HARDWARE REMOVED   . Cataract extraction w/ intraocular lens  implant, bilateral  2014  . Cystoscopy with stent placement Left 12/19/2013    Procedure: CYSTOSCOPY BLADDER BX, LEFT URETERAL STENT PLACEMENT , LEFT RETROGRADE AND PYLOGRAM;  Surgeon: Festus Aloe, MD;  Location: [facility];  Service: Urology;  Laterality: Left;  . Transurethral resection of bladder tumor N/A 12/19/2013    Procedure:  TRANSURETHRAL RESECTION OF BLADDER TUMOR WITH GYRUS (TURBT-GYRUS);  Surgeon: Festus Aloe, MD;  Location: [facility];  Service: Urology;  Laterality: N/A;  . Cystoscopy w/ retrogrades Bilateral 12/14/2014    Procedure: CYSTOSCOPY BLADDER BIOPSY FULGERATION MITOMYCIN C BILATERAL RETROGRADE PYELOGRAM;  Surgeon: Festus Aloe, MD;  Location: [facility];  Service: Urology;  Laterality: Bilateral;    There were no vitals filed for this visit.  Visit Diagnosis:  Bradykinesia  Posture abnormality  Decreased coordination  Unsteadiness  Cognitive deficits      Subjective Assessment - 01/21/15 1528    Subjective  "I can see that I probably need this more than I thought I  did"   Pertinent History Parkinson's disease    Patient Stated Goals unsure   Currently in Pain? No/denies                             OT Education - 01/21/15 1521    Education Details PWR! moves in quadraped (basic 4, simplified version of PWR! Step), benefits of exercise, PD-specific exercise principles, functional translation of PWR! exercises   Person(s) Educated Patient   Methods Explanation;Demonstration;Verbal cues;Handout;Tactile cues   Comprehension Verbalized understanding;Returned demonstration;Verbal cues required;Tactile cues required;Need further instruction    (mod cues for [employer]! Moves initially then min cueing with repetition)         OT Short Term Goals - 01/14/15 1419    OT SHORT TERM GOAL #1   Title Pt will be independent with initial PD-specific HEP.--check STGs 02/13/15   Time 4   Period Weeks   Status New   OT SHORT TERM GOAL #2   Title Pt will improve coordination for ADLs as shown by improving time on 9-hole peg test by at least 5 sec with dominant LUE.   Baseline 44.63sec   Time 4   Period Weeks   Status New   OT SHORT TERM GOAL #3   Title Pt will report incr ease with eating using adaptive strategies prn.   Time 4   Period Weeks   Status New   OT SHORT TERM GOAL #4   Title Pt will verbalize understanding of ways to prevent future complications and PD-related community resources.   Time 4   Period Weeks   Status New   OT SHORT TERM GOAL #5   Title Pt will verbalize understanding of cognitive compensation strategies/ways to promote cognitive skills prn.   Time 8   Period Weeks   Status New           OT Long Term Goals - 01/14/15 1430    OT LONG TERM GOAL #1   Title Pt will verbalize understanding of AE/strategies to increase ease/safety with ADLs/IADLs prn.   Time 8   Period Weeks   Status New   OT LONG TERM GOAL #2   Title Pt will improve coordination for ADLs as shown by improving time on 9-hole peg test by at least 10 sec with dominant LUE.   Baseline L-44.63sec   Time 8   Period Weeks   Status New   OT LONG TERM GOAL #3   Title Assess Box and blocks test for functional reaching/coordination and set goal as appropriate.   Time 8   Period Weeks   Status New   OT LONG TERM GOAL #4   Title Pt will report incr ease with buttons, zippers, tying, belt.   Time 4   Period Days   Status New   OT LONG TERM GOAL #5   Title Pt will improve dressing ease as shown by improving time on PPT#4 by at least 5sec.   Baseline 24.77   Time 8   Period Weeks   Status New   Long Term Additional Goals   Additional Long Term Goals Yes   OT LONG TERM GOAL #6   Title Pt will improve  balance/functional reaching for ADLs as shown by improving standing functional reach test to at least 11inches with each UE.   Baseline R-9", L-10"   Time 8   Period Weeks   Status New  Plan - 01/21/15 1656    Clinical Impression Statement Pt demo good response to cueing for big amplitude movements.  Pt requires incr time, repetition, and cueing for improved performance.   Plan box and blocks test, review quadraped PWR! moves, initiate supine PRW! HEP if able   OT Home Exercise Plan Education provided:  01/21/15 [place]is!    Consulted and Agree with Plan of Care Patient        Problem List Patient Active Problem List   Diagnosis Date Noted  . Bladder cancer ([place]) 04/26/2014  . Right inguinal hernia 09/10/2011  . Direct inguinal hernia 08/07/2011  . Urinary frequency 03/18/2011  . Skin lesion 03/18/2011  . General medical examination 03/18/2011  . Cerumen impaction 12/04/2010  . Nail fungus 10/14/2010  . Fatigue 10/14/2010  . RHINITIS 02/12/2010  . GERD 12/03/2009  . Depression 08/28/2009  . WEIGHT LOSS 08/28/2009  . PSA, INCREASED 06/11/2009  . BACK PAIN 01/09/2009  . NEOPLASM OF UNCERTAIN BEHAVIOR OF SKIN 07/05/2008  . PARKINSON'S DISEASE 05/13/2007  . Hyperlipidemia 02/28/2007  . ERECTILE DYSFUNCTION 02/28/2007  . Essential hypertension 02/28/2007  . OSTEOARTHRITIS 02/28/2007    [facility] 01/21/2015, 5:03 PM  [facility] [address] [place], [place], [address] Phone: [phone]   Fax:  [place], OTR/L 01/21/2015 5:03 PM

## 2015-01-21 NOTE — Therapy (Signed)
[facility] [address] [place], [place], [address] Phone: [phone]   Fax:  [phone]  Physical Therapy Treatment  Patient Details  Name: [name] MRN: [id number] Date of Birth: 07-19-1933 Referring Provider:  Midge Minium, MD  Encounter Date: 01/21/2015      PT End of Session - 01/21/15 1409    Visit Number 2   Number of Visits 17   Date for PT Re-Evaluation 03/17/15   Authorization Type Blue Medicare-G-code every 10th visit   PT Start Time 1318   PT Stop Time 1400   PT Time Calculation (min) 42 min   Activity Tolerance Patient tolerated treatment well   Behavior During Therapy [facility] for tasks assessed/performed      Past Medical History  Diagnosis Date  . Hyperlipidemia   . Hypertension   . Parkinson disease DX   2005    NEUROLOGIST-   DR TAT--  IDIOPATHIC PARKINSON'S /  TREMORS CONTROLLED WITH MEDS  . GERD (gastroesophageal reflux disease)   . Allergic rhinitis   . OAB (overactive bladder)   . Frequency of urination   . Urge urinary incontinence   . Nocturia   . BPH with elevated PSA   . Osteoarthritis     KNEES, SHOULDER  . DDD (degenerative disc disease), lumbar   . Hearing loss     does not wear his hearing aid  . Mild obstructive sleep apnea     PER PT  MILD OSA , NO CPAP RX,  STUDY DONE 2009  . Depression   . Bladder cancer     urologist-  dr eskridge--  low grade TA  . Dry mouth     USES BIOTIN SPRAY/ MOUTHWASH    Past Surgical History  Procedure Laterality Date  . Nasal sinus surgery  1982  . Inguinal hernia repair  03/09/2012    Procedure: HERNIA REPAIR INGUINAL ADULT;  Surgeon: Adin Hector, MD;  [facility]: [facility];  Service: General;  Laterality: Right;  . Insertion of mesh  03/09/2012    Procedure: INSERTION OF MESH;  Surgeon: Adin Hector, MD;  [facility]: [facility];  Service: General;  Laterality: Right;  . Removal benign cyst left forehead  1969  . Orif right arm  fx  1949    HARDWARE REMOVED   . Cataract extraction w/ intraocular lens  implant, bilateral  2014  . Cystoscopy with stent placement Left 12/19/2013    Procedure: CYSTOSCOPY BLADDER BX, LEFT URETERAL STENT PLACEMENT , LEFT RETROGRADE AND PYLOGRAM;  Surgeon: Festus Aloe, MD;  Location: [facility];  Service: Urology;  Laterality: Left;  . Transurethral resection of bladder tumor N/A 12/19/2013    Procedure:  TRANSURETHRAL RESECTION OF BLADDER TUMOR WITH GYRUS (TURBT-GYRUS);  Surgeon: Festus Aloe, MD;  Location: [facility];  Service: Urology;  Laterality: N/A;  . Cystoscopy w/ retrogrades Bilateral 12/14/2014    Procedure: CYSTOSCOPY BLADDER BIOPSY FULGERATION MITOMYCIN C BILATERAL RETROGRADE PYELOGRAM;  Surgeon: Festus Aloe, MD;  Location: [facility];  Service: Urology;  Laterality: Bilateral;    There were no vitals filed for this visit.  Visit Diagnosis:  Abnormality of gait      Subjective Assessment - 01/21/15 1323    Subjective Pt denies falls or change since last visit.  Denies pain today.   Patient Stated Goals [name] goal for therapy is to improve balance and mobility.   Currently in Pain? No/denies  PWR [facility]) - 01/21/15 1406    PWR! exercises Moves in standing   PWR! Up 20   PWR! Rock 20   PWR! Twist 20   PWR Step 20   Comments cues for technique, posture and intensity.  Difficulty coordinating initially then improved after several reps             PT Education - 01/21/15 1408    Education provided Yes   Education Details PWR! standing, incorporating fitness into daily routine, benefits of exercise, functional translation of PWR! exercises   Person(s) Educated Patient   Methods Explanation;Demonstration;Handout   Comprehension Verbalized understanding;Need further instruction          PT Short Term Goals - 01/16/15 1123    PT SHORT TERM GOAL #1   Title Pt  will perform HEP for improved functional mobility, gait, balance and transfers.  TARGET 02/14/15   Time 4   Period Weeks   Status New   PT SHORT TERM GOAL #2   Title Pt will improve 5x sit<>stand to less than or equal to 18 seconds for improved transfer efficiency and safety.   Time 4   Period Weeks   Status New   PT SHORT TERM GOAL #3   Title Pt will improve TUG score to less than or equal to 13.5 seconds for decreased fall risk.   Time 4   Period Weeks   Status New   PT SHORT TERM GOAL #4   Title Pt will improve gait velocity to at least 2.8 ft/sec for improved gait efficiency and safety.   Time 4   Period Weeks   Status New           PT Long Term Goals - 01/16/15 1125    PT LONG TERM GOAL #1   Title Pt will verbalize understanding of fall prevention in the home environment.   TARGET 03/17/15   Time 8   Period Weeks   Status New   PT LONG TERM GOAL #2   Title Pt will improve 5x sit<>stand to less than or equal to 15 seconds for improved efficiency and safety with transfers.   Time 8   Period Weeks   Status New   PT LONG TERM GOAL #3   Title Pt will improve TUG cognitive score to less than or equal to 15 seconds for decreased fall risk and improved ability for dual tasking.   Time 8   Period Weeks   Status New   PT LONG TERM GOAL #4   Title Pt will improve Functional Gait Assessment score to at least 20/30 for improved functional mobility and decreased fall risk.   Time 8   Period Weeks   Status New   PT LONG TERM GOAL #5   Title Pt will verbalize plans for continue community fitness upon D/C from PT.   Time 8   Period Weeks   Status New               Plan - 01/21/15 1410    Clinical Impression Statement Pt with multiple concerns regarding incorporating fitness routine into daily schedule.  Feels he does enough exercise with chores around the house.  Continue to reinforce need for increased intensity and benefits of exercise.  States he has [employer] and will go when wife goes but she works full time.  Also states he does not like to walk outdoors at home alone for fear of falling.  Continue PT per POC.  Pt will benefit from skilled therapeutic intervention in order to improve on the following deficits Abnormal gait;Decreased balance;Decreased mobility;Decreased strength;Difficulty walking;Postural dysfunction   Rehab Potential Good   PT Frequency 2x / week   PT Duration 8 weeks   PT Treatment/Interventions ADLs/Self Care Home Management;Therapeutic exercise;Therapeutic activities;Functional mobility training;Gait training;Balance training;Neuromuscular re-education;Patient/family education   PT Next Visit Plan Review standing PWR!Marland Kitchen  Initiate seated PWR!Marland Kitchen  Revisit walking program.  Transfer training.   Consulted and Agree with Plan of Care Patient        Problem List Patient Active Problem List   Diagnosis Date Noted  . Bladder cancer ([place]) 04/26/2014  . Right inguinal hernia 09/10/2011  . Direct inguinal hernia 08/07/2011  . Urinary frequency 03/18/2011  . Skin lesion 03/18/2011  . General medical examination 03/18/2011  . Cerumen impaction 12/04/2010  . Nail fungus 10/14/2010  . Fatigue 10/14/2010  . RHINITIS 02/12/2010  . GERD 12/03/2009  . Depression 08/28/2009  . WEIGHT LOSS 08/28/2009  . PSA, INCREASED 06/11/2009  . BACK PAIN 01/09/2009  . NEOPLASM OF UNCERTAIN BEHAVIOR OF SKIN 07/05/2008  . PARKINSON'S DISEASE 05/13/2007  . Hyperlipidemia 02/28/2007  . ERECTILE DYSFUNCTION 02/28/2007  . Essential hypertension 02/28/2007  . OSTEOARTHRITIS 02/28/2007    Narda Bonds 01/21/2015, 2:14 PM  [facility] [address] [place], [place], [address] Phone: [phone]   Fax:  [place], [place] 01/21/2015 2:14 PM Phone: [phone] Fax: [phone]

## 2015-01-21 NOTE — Patient Instructions (Signed)
Provided PWR! Standing x 20 reps at counter for support if needed

## 2015-01-22 ENCOUNTER — Encounter: Payer: Self-pay | Admitting: Occupational Therapy

## 2015-01-22 ENCOUNTER — Ambulatory Visit: Payer: Medicare Other

## 2015-01-22 ENCOUNTER — Ambulatory Visit: Payer: Medicare Other | Admitting: Occupational Therapy

## 2015-01-22 DIAGNOSIS — R278 Other lack of coordination: Secondary | ICD-10-CM

## 2015-01-22 DIAGNOSIS — R279 Unspecified lack of coordination: Secondary | ICD-10-CM

## 2015-01-22 DIAGNOSIS — R258 Other abnormal involuntary movements: Secondary | ICD-10-CM

## 2015-01-22 DIAGNOSIS — R269 Unspecified abnormalities of gait and mobility: Secondary | ICD-10-CM | POA: Diagnosis not present

## 2015-01-22 DIAGNOSIS — R490 Dysphonia: Secondary | ICD-10-CM

## 2015-01-22 DIAGNOSIS — R293 Abnormal posture: Secondary | ICD-10-CM

## 2015-01-22 DIAGNOSIS — G20 Parkinson's disease: Secondary | ICD-10-CM

## 2015-01-22 NOTE — Therapy (Signed)
[facility] [address] [place], [place], [address] Phone: [phone]   Fax:  [phone]  Speech Language Pathology Treatment  Patient Details  Name: [name] MRN: [id number] Date of Birth: Sep 11, 1933 Referring Provider:  Midge Minium, MD  Encounter Date: 01/22/2015      End of Session - 01/22/15 1632    Visit Number 2   Number of Visits 17   Date for SLP Re-Evaluation 03/11/15   Authorization Type Blue medicare   Authorization Time Period Notes from [place] state no auth required for ST   SLP Start Time 0223   SLP Stop Time  1618   SLP Time Calculation (min) 43 min   Activity Tolerance Patient tolerated treatment well      Past Medical History  Diagnosis Date  . Hyperlipidemia   . Hypertension   . Parkinson disease ([place]) DX   2005    NEUROLOGIST-   DR TAT--  IDIOPATHIC PARKINSON'S /  TREMORS CONTROLLED WITH MEDS  . GERD (gastroesophageal reflux disease)   . Allergic rhinitis   . OAB (overactive bladder)   . Frequency of urination   . Urge urinary incontinence   . Nocturia   . BPH with elevated PSA   . Osteoarthritis     KNEES, SHOULDER  . DDD (degenerative disc disease), lumbar   . Hearing loss     does not wear his hearing aid  . Mild obstructive sleep apnea     PER PT  MILD OSA , NO CPAP RX,  STUDY DONE 2009  . Depression   . Bladder cancer [facility])     urologist-  dr eskridge--  low grade TA  . Dry mouth     USES BIOTIN SPRAY/ MOUTHWASH    Past Surgical History  Procedure Laterality Date  . Nasal sinus surgery  1982  . Inguinal hernia repair  03/09/2012    Procedure: HERNIA REPAIR INGUINAL ADULT;  Surgeon: Adin Hector, MD;  [facility];  Service: General;  Laterality: Right;  . Insertion of mesh  03/09/2012    Procedure: INSERTION OF MESH;  Surgeon: Adin Hector, MD;  [facility];  Service: General;  Laterality: Right;  . Removal benign cyst left forehead  1969  .  Orif right arm fx  1949    HARDWARE REMOVED   . Cataract extraction w/ intraocular lens  implant, bilateral  2014  . Cystoscopy with stent placement Left 12/19/2013    Procedure: CYSTOSCOPY BLADDER BX, LEFT URETERAL STENT PLACEMENT , LEFT RETROGRADE AND PYLOGRAM;  Surgeon: Festus Aloe, MD;  Location: [facility];  Service: Urology;  Laterality: Left;  . Transurethral resection of bladder tumor N/A 12/19/2013    Procedure:  TRANSURETHRAL RESECTION OF BLADDER TUMOR WITH GYRUS (TURBT-GYRUS);  Surgeon: Festus Aloe, MD;  [facility];  Service: Urology;  Laterality: N/A;  . Cystoscopy w/ retrogrades Bilateral 12/14/2014    Procedure: CYSTOSCOPY BLADDER BIOPSY FULGERATION MITOMYCIN C BILATERAL RETROGRADE PYELOGRAM;  Surgeon: Festus Aloe, MD;  Location: [facility];  Service: Urology;  Laterality: Bilateral;    There were no vitals filed for this visit.  Visit Diagnosis: Hypokinetic Parkinsonian dysphonia      Subjective Assessment - 01/22/15 1545    Subjective Pt brought homework sheet SLP provided to pt on evaluation date.               ADULT SLP TREATMENT - 01/22/15 1551    General Information  Behavior/Cognition Alert;Cooperative;Pleasant mood   Treatment Provided   Treatment provided Cognitive-Linquistic   Pain Assessment   Pain Assessment No/denies pain   Cognitive-Linquistic Treatment   Treatment focused on Dysarthria   Skilled Treatment [name] last PD dose 1pm. SLP began pt's retraining for louder WNL speech with loud /a/ with average 89dB over 5 reps. In opposites and divergent naming pt able to maintain loudness above 73dB 100% of the time. In short description tasks, pt's speech remained above or at 70dB approx 90% of the time. In 4-minutes of short, simple to mod copmlex conversation, SLP assessed pt's voice at or above 70dB 90% of the time.    Assessment / Recommendations / Plan   Plan Continue with current  plan of care   Progression Toward Goals   Progression toward goals Progressing toward goals          SLP Education - 01/22/15 1632    Education provided Yes   Education Details louder, WNL speech needed to carry over into complex conversation in and outside the [place] room   Person(s) Educated Patient   Methods Explanation   Comprehension Verbalized understanding          SLP Short Term Goals - 01/22/15 1553    SLP SHORT TERM GOAL #1   Title Pt will demonsrate loud /a/ average  of 90dB over 3 sessions with rare min A   Time 4   Period Weeks   Status On-going   SLP SHORT TERM GOAL #2   Title Pt will average 70dB for structured speech tasks with occasional min A   Time 4   Period Weeks   Status On-going          SLP Long Term Goals - 01/22/15 1635    SLP LONG TERM GOAL #1   Title Pt will maintain average of 70dB during conversation over 10 minutes with occasional min A   Time 8   Period Weeks   Status On-going   SLP LONG TERM GOAL #2   Title Pt will be audible at conversation level over 12 minuts in noisy environment with occasional min A   Time 8   Period Weeks   Status On-going   SLP LONG TERM GOAL #3   Title Tolerate regular diet/thin liquids with general swallow precautions with rare min A per pt report over 2 sessions.    Time 8   Period Weeks   Status On-going          Plan - 01/22/15 1633    Clinical Impression Statement Pt was able to maintain 70dB in approx 4 minutes simple to mod complex conversation. He did very well for his first therapy session. Suspect therapy course will be shorter than 8 weeks. Skilled ST needed to cont to ensure louder WNL speech is carried over to conversation outside [place] room.   Speech Therapy Frequency 2x / week   Duration --  8 weeks   Treatment/Interventions SLP instruction and feedback;Compensatory strategies;Patient/family education;Functional tasks;Internal/external aids   Potential to Achieve Goals Good   Potential  Considerations Ability to learn/carryover information        Problem List Patient Active Problem List   Diagnosis Date Noted  . Bladder cancer ([place]) 04/26/2014  . Right inguinal hernia 09/10/2011  . Direct inguinal hernia 08/07/2011  . Urinary frequency 03/18/2011  . Skin lesion 03/18/2011  . General medical examination 03/18/2011  . Cerumen impaction 12/04/2010  . Nail fungus 10/14/2010  . Fatigue 10/14/2010  . RHINITIS  02/12/2010  . GERD 12/03/2009  . Depression 08/28/2009  . WEIGHT LOSS 08/28/2009  . PSA, INCREASED 06/11/2009  . BACK PAIN 01/09/2009  . NEOPLASM OF UNCERTAIN BEHAVIOR OF SKIN 07/05/2008  . PARKINSON'S DISEASE 05/13/2007  . Hyperlipidemia 02/28/2007  . ERECTILE DYSFUNCTION 02/28/2007  . Essential hypertension 02/28/2007  . OSTEOARTHRITIS 02/28/2007    Othel Hoogendoorn , MS, CCC-SLP  01/22/2015, 4:35 PM  [facility] [address] [place], [place], [address] Phone: [phone]   Fax:  [phone]

## 2015-01-22 NOTE — Therapy (Signed)
[facility] [address] [place], [place], [address] Phone: [phone]   Fax:  [phone]  Occupational Therapy Treatment  Patient Details  Name: [name] MRN: [id number] Date of Birth: 1933-05-28 Referring Provider:  Midge Minium, MD  Encounter Date: 01/22/2015      OT End of Session - 01/22/15 1644    Visit Number 3   Number of Visits 17   Date for OT Re-Evaluation 03/15/15   Authorization Type Blue Medicare, no visit limit, no auth required, g-code   Authorization - Visit Number 3   Authorization - Number of Visits 10   OT Start Time 1451   OT Stop Time 1535   OT Time Calculation (min) 44 min   Activity Tolerance Patient tolerated treatment well   Behavior During Therapy Flat affect      Past Medical History  Diagnosis Date  . Hyperlipidemia   . Hypertension   . Parkinson disease ([place]) DX   2005    NEUROLOGIST-   DR TAT--  IDIOPATHIC PARKINSON'S /  TREMORS CONTROLLED WITH MEDS  . GERD (gastroesophageal reflux disease)   . Allergic rhinitis   . OAB (overactive bladder)   . Frequency of urination   . Urge urinary incontinence   . Nocturia   . BPH with elevated PSA   . Osteoarthritis     KNEES, SHOULDER  . DDD (degenerative disc disease), lumbar   . Hearing loss     does not wear his hearing aid  . Mild obstructive sleep apnea     PER PT  MILD OSA , NO CPAP RX,  STUDY DONE 2009  . Depression   . Bladder cancer [facility])     urologist-  dr eskridge--  low grade TA  . Dry mouth     USES BIOTIN SPRAY/ MOUTHWASH    Past Surgical History  Procedure Laterality Date  . Nasal sinus surgery  1982  . Inguinal hernia repair  03/09/2012    Procedure: HERNIA REPAIR INGUINAL ADULT;  Surgeon: Adin Hector, MD;  [facility];  Service: General;  Laterality: Right;  . Insertion of mesh  03/09/2012    Procedure: INSERTION OF MESH;  Surgeon: Adin Hector, MD;  [facility];  Service:  General;  Laterality: Right;  . Removal benign cyst left forehead  1969  . Orif right arm fx  1949    HARDWARE REMOVED   . Cataract extraction w/ intraocular lens  implant, bilateral  2014  . Cystoscopy with stent placement Left 12/19/2013    Procedure: CYSTOSCOPY BLADDER BX, LEFT URETERAL STENT PLACEMENT , LEFT RETROGRADE AND PYLOGRAM;  Surgeon: Festus Aloe, MD;  [facility];  Service: Urology;  Laterality: Left;  . Transurethral resection of bladder tumor N/A 12/19/2013    Procedure:  TRANSURETHRAL RESECTION OF BLADDER TUMOR WITH GYRUS (TURBT-GYRUS);  Surgeon: Festus Aloe, MD;  Location: [facility];  Service: Urology;  Laterality: N/A;  . Cystoscopy w/ retrogrades Bilateral 12/14/2014    Procedure: CYSTOSCOPY BLADDER BIOPSY FULGERATION MITOMYCIN C BILATERAL RETROGRADE PYELOGRAM;  Surgeon: Festus Aloe, MD;  Location: [facility];  Service: Urology;  Laterality: Bilateral;    There were no vitals filed for this visit.  Visit Diagnosis:  Bradykinesia  Posture abnormality  Decreased coordination      Subjective Assessment - 01/22/15 1454    Subjective  "I was sore this morning after exercises."  Pt reports feeling good at end of session  Pertinent History Parkinson's disease    Patient Stated Goals unsure   Currently in Pain? Yes   Pain Score 4    Pain Location --  overall    Pain Descriptors / Indicators Aching   Aggravating Factors  after exercises yesterday, muscle soreness   Pain Relieving Factors improved after stretching/exercise            [facility] OT Assessment - 01/22/15 0001    Coordination   Box and Blocks R-44, L-44 blocks                        OT Education - 01/22/15 1637    Education Details supine PWR! exercises (basic 4--without hip raise for PWR! step) and how to translate into function for bed mobility, practiced supine>sit using strategies, importance of exercise to prevent  future complications   Person(s) Educated Patient   Methods Explanation;Verbal cues;Tactile cues;Handout;Demonstration   Comprehension Verbalized understanding;Returned demonstration;Verbal cues required;Tactile cues required;Need further instruction  min-mod cueing for sequencing, big movements          OT Short Term Goals - 01/14/15 1419    OT SHORT TERM GOAL #1   Title Pt will be independent with initial PD-specific HEP.--check STGs 02/13/15   Time 4   Period Weeks   Status New   OT SHORT TERM GOAL #2   Title Pt will improve coordination for ADLs as shown by improving time on 9-hole peg test by at least 5 sec with dominant LUE.   Baseline 44.63sec   Time 4   Period Weeks   Status New   OT SHORT TERM GOAL #3   Title Pt will report incr ease with eating using adaptive strategies prn.   Time 4   Period Weeks   Status New   OT SHORT TERM GOAL #4   Title Pt will verbalize understanding of ways to prevent future complications and PD-related community resources.   Time 4   Period Weeks   Status New   OT SHORT TERM GOAL #5   Title Pt will verbalize understanding of cognitive compensation strategies/ways to promote cognitive skills prn.   Time 8   Period Weeks   Status New           OT Long Term Goals - 01/22/15 1651    OT LONG TERM GOAL #1   Title Pt will verbalize understanding of AE/strategies to increase ease/safety with ADLs/IADLs prn.   Time 8   Period Weeks   Status New   OT LONG TERM GOAL #2   Title Pt will improve coordination for ADLs as shown by improving time on 9-hole peg test by at least 10 sec with dominant LUE.   Baseline L-44.63sec   Time 8   Period Weeks   Status New   OT LONG TERM GOAL #3   Title Pt will improve functional reaching/coordination as shown by improving time on box and blocks test by at least 5 bilaterally.   Baseline 44 bilaterally 01/22/15   Time 8   Period Weeks   Status New   OT LONG TERM GOAL #4   Title Pt will report incr  ease with buttons, zippers, tying, belt.   Time 4   Period Days   Status New   OT LONG TERM GOAL #5   Title Pt will improve dressing ease as shown by improving time on PPT#4 by at least 5sec.   Baseline 24.77   Time 8   Period  Weeks   Status New   OT LONG TERM GOAL #6   Title Pt will improve balance/functional reaching for ADLs as shown by improving standing functional reach test to at least 11inches with each UE.   Baseline R-9", L-10"   Time 8   Period Weeks   Status New               Plan - 01/22/15 1644    Clinical Impression Statement Pt needs incr time for sequencing movements and effort during PWR! moves, but improves with repetition and cues.   Plan review quadraped and supine PWR! HEP, functional supine>sit using big movements.  Big movements during ADLs the following session   OT Home Exercise Plan Education provided:  01/21/15 Karn Cassis!; 01/22/15 Supine PWR!   Consulted and Agree with Plan of Care Patient        Problem List Patient Active Problem List   Diagnosis Date Noted  . Bladder cancer ([place]) 04/26/2014  . Right inguinal hernia 09/10/2011  . Direct inguinal hernia 08/07/2011  . Urinary frequency 03/18/2011  . Skin lesion 03/18/2011  . General medical examination 03/18/2011  . Cerumen impaction 12/04/2010  . Nail fungus 10/14/2010  . Fatigue 10/14/2010  . RHINITIS 02/12/2010  . GERD 12/03/2009  . Depression 08/28/2009  . WEIGHT LOSS 08/28/2009  . PSA, INCREASED 06/11/2009  . BACK PAIN 01/09/2009  . NEOPLASM OF UNCERTAIN BEHAVIOR OF SKIN 07/05/2008  . PARKINSON'S DISEASE 05/13/2007  . Hyperlipidemia 02/28/2007  . ERECTILE DYSFUNCTION 02/28/2007  . Essential hypertension 02/28/2007  . OSTEOARTHRITIS 02/28/2007    [facility] 01/22/2015, 4:52 PM  [facility] [address] [place], [place], [address] Phone: [phone]   Fax:  [place],  OTR/L 01/22/2015 4:52 PM

## 2015-01-22 NOTE — Patient Instructions (Signed)
  Please complete the assigned speech therapy homework prior to your next session.  

## 2015-01-28 ENCOUNTER — Other Ambulatory Visit: Payer: Self-pay | Admitting: Family Medicine

## 2015-01-28 NOTE — Telephone Encounter (Signed)
Medication Detail      Disp Refills Start End     celecoxib (CELEBREX) 200 MG capsule 30 capsule 3 10/05/2014     Sig - Route: Take 1 capsule (200 mg total) by mouth daily. - Oral    E-Prescribing Status: Receipt confirmed by pharmacy (10/05/2014 9:28 AM EDT)     Pharmacy    [facility] [place], [place] - [address]   Refill sent per [facility] refill protocol/SLS

## 2015-01-30 ENCOUNTER — Ambulatory Visit: Payer: Medicare Other | Admitting: Physical Therapy

## 2015-01-30 ENCOUNTER — Ambulatory Visit: Payer: Medicare Other

## 2015-01-30 ENCOUNTER — Ambulatory Visit: Payer: Medicare Other | Admitting: Occupational Therapy

## 2015-01-30 DIAGNOSIS — R279 Unspecified lack of coordination: Secondary | ICD-10-CM

## 2015-01-30 DIAGNOSIS — R293 Abnormal posture: Secondary | ICD-10-CM

## 2015-01-30 DIAGNOSIS — R258 Other abnormal involuntary movements: Secondary | ICD-10-CM

## 2015-01-30 DIAGNOSIS — R4189 Other symptoms and signs involving cognitive functions and awareness: Secondary | ICD-10-CM

## 2015-01-30 DIAGNOSIS — G20 Parkinson's disease: Secondary | ICD-10-CM

## 2015-01-30 DIAGNOSIS — R278 Other lack of coordination: Secondary | ICD-10-CM

## 2015-01-30 DIAGNOSIS — R490 Dysphonia: Secondary | ICD-10-CM

## 2015-01-30 DIAGNOSIS — R269 Unspecified abnormalities of gait and mobility: Secondary | ICD-10-CM | POA: Diagnosis not present

## 2015-01-30 NOTE — Patient Instructions (Signed)
Discuss one (or two) of the opinions with your wife that are on the sheet. Loop back around occasionally during the conversation

## 2015-01-30 NOTE — Therapy (Signed)
[facility] [address] [place], [place], [address] Phone: [phone]   Fax:  [phone]  Speech Language Pathology Treatment  Patient Details  Name: [name] MRN: [id number] Date of Birth: 09/19/1933 Referring Provider:  Midge Minium, MD  Encounter Date: 01/30/2015      End of Session - 01/30/15 1526    Visit Number 3   Number of Visits 17   Date for SLP Re-Evaluation 03/11/15   SLP Start Time 1448   SLP Stop Time  1530   SLP Time Calculation (min) 42 min      Past Medical History  Diagnosis Date  . Hyperlipidemia   . Hypertension   . Parkinson disease ([place]) DX   2005    NEUROLOGIST-   DR TAT--  IDIOPATHIC PARKINSON'S /  TREMORS CONTROLLED WITH MEDS  . GERD (gastroesophageal reflux disease)   . Allergic rhinitis   . OAB (overactive bladder)   . Frequency of urination   . Urge urinary incontinence   . Nocturia   . BPH with elevated PSA   . Osteoarthritis     KNEES, SHOULDER  . DDD (degenerative disc disease), lumbar   . Hearing loss     does not wear his hearing aid  . Mild obstructive sleep apnea     PER PT  MILD OSA , NO CPAP RX,  STUDY DONE 2009  . Depression   . Bladder cancer [facility])     urologist-  dr eskridge--  low grade TA  . Dry mouth     USES BIOTIN SPRAY/ MOUTHWASH    Past Surgical History  Procedure Laterality Date  . Nasal sinus surgery  1982  . Inguinal hernia repair  03/09/2012    Procedure: HERNIA REPAIR INGUINAL ADULT;  Surgeon: Adin Hector, MD;  Location: [facility];  Service: General;  Laterality: Right;  . Insertion of mesh  03/09/2012    Procedure: INSERTION OF MESH;  Surgeon: Adin Hector, MD;  Location: [facility];  Service: General;  Laterality: Right;  . Removal benign cyst left forehead  1969  . Orif right arm fx  1949    HARDWARE REMOVED   . Cataract extraction w/ intraocular lens  implant, bilateral  2014  . Cystoscopy with stent placement Left 12/19/2013     Procedure: CYSTOSCOPY BLADDER BX, LEFT URETERAL STENT PLACEMENT , LEFT RETROGRADE AND PYLOGRAM;  Surgeon: Festus Aloe, MD;  Location: [facility];  Service: Urology;  Laterality: Left;  . Transurethral resection of bladder tumor N/A 12/19/2013    Procedure:  TRANSURETHRAL RESECTION OF BLADDER TUMOR WITH GYRUS (TURBT-GYRUS);  Surgeon: Festus Aloe, MD;  Location: [facility];  Service: Urology;  Laterality: N/A;  . Cystoscopy w/ retrogrades Bilateral 12/14/2014    Procedure: CYSTOSCOPY BLADDER BIOPSY FULGERATION MITOMYCIN C BILATERAL RETROGRADE PYELOGRAM;  Surgeon: Festus Aloe, MD;  Location: [facility];  Service: Urology;  Laterality: Bilateral;    There were no vitals filed for this visit.  Visit Diagnosis: Hypokinetic Parkinsonian dysphonia      Subjective Assessment - 01/30/15 1452    Subjective Pt with question about just reading something out loud instead of going through the homework.               ADULT SLP TREATMENT - 01/30/15 1502    General Information   Behavior/Cognition Alert;Cooperative;Pleasant mood   Treatment Provided   Treatment provided Cognitive-Linquistic   Pain Assessment   Pain Assessment No/denies pain  Cognitive-Linquistic Treatment   Treatment focused on Dysarthria   Skilled Treatment [name] conversation with SLP maintained at [facility] in 7 minutes sipmle to mod copmlex conversation. SLP educated pt on he needed to feel and hear normal volume in order to manipulate his volume when he lowers it. Pt maintained WNL volume for 22 minutes. SLP encouraged pt to "loop around" and self evaluate how his volume feels/sounds during conversations with people. 9 minutes conversation  (mod complex) further was WNL volume 90% of the time, with SLP cuing pt to "loop around" to self-assess volume.   Assessment / Recommendations / Plan   Plan Continue with current plan of care   Progression Toward Goals   Progression  toward goals Progressing toward goals          SLP Education - 01/30/15 1706    Education provided Yes   Education Details WNL speech volume in conversation, self assessment of volume   Person(s) Educated Patient   Methods Explanation   Comprehension Verbalized understanding;Returned demonstration          SLP Short Term Goals - 01/30/15 1524    SLP SHORT TERM GOAL #1   Title Pt will demonsrate loud /a/ average  of 90dB over 3 sessions with rare min A   Time 3   Period Weeks   Status On-going   SLP SHORT TERM GOAL #2   Title Pt will average 70dB for structured speech tasks with occasional min A   Time --   Period --   Status Achieved          SLP Long Term Goals - 01/30/15 1525    SLP LONG TERM GOAL #1   Title Pt will maintain average of 70dB during conversation over 10 minutes with occasional min A over two sessions   Time 7   Period Weeks   Status Revised   SLP LONG TERM GOAL #2   Title Pt will be audible at conversation level over 12 minuts in noisy environment with occasional min A over two sessions   Time 7   Period Weeks   Status Revised   SLP LONG TERM GOAL #3   Title Tolerate regular diet/thin liquids with general swallow precautions with rare min A per pt report over 2 sessions.    Time 7   Period Weeks   Status On-going          Plan - 01/30/15 1706    Clinical Impression Statement Pt was able to maintain 70dB in approx 20 minutes simple -mod complex conversation.  Suspect therapy course will be shorter than 4-6 weeks. Skilled ST needed to cont to ensure louder WNL speech is carried over to conversation outside [place] room.   Speech Therapy Frequency 2x / week   Duration --  7 weeks   Treatment/Interventions SLP instruction and feedback;Compensatory strategies;Patient/family education;Functional tasks;Internal/external aids   Potential to Achieve Goals Good   Potential Considerations Ability to learn/carryover information        Problem  List Patient Active Problem List   Diagnosis Date Noted  . Bladder cancer ([place]) 04/26/2014  . Right inguinal hernia 09/10/2011  . Direct inguinal hernia 08/07/2011  . Urinary frequency 03/18/2011  . Skin lesion 03/18/2011  . General medical examination 03/18/2011  . Cerumen impaction 12/04/2010  . Nail fungus 10/14/2010  . Fatigue 10/14/2010  . RHINITIS 02/12/2010  . GERD 12/03/2009  . Depression 08/28/2009  . WEIGHT LOSS 08/28/2009  . PSA, INCREASED 06/11/2009  . BACK PAIN 01/09/2009  .  NEOPLASM OF UNCERTAIN BEHAVIOR OF SKIN 07/05/2008  . PARKINSON'S DISEASE 05/13/2007  . Hyperlipidemia 02/28/2007  . ERECTILE DYSFUNCTION 02/28/2007  . Essential hypertension 02/28/2007  . OSTEOARTHRITIS 02/28/2007    Darenda Fike , [place], CCC-SLP   01/30/2015, 5:08 PM  [facility] [address] [place], [place], [address] Phone: [phone]   Fax:  [phone]

## 2015-01-31 ENCOUNTER — Ambulatory Visit: Payer: Medicare Other | Admitting: Speech Pathology

## 2015-01-31 ENCOUNTER — Ambulatory Visit: Payer: Medicare Other | Admitting: Occupational Therapy

## 2015-01-31 DIAGNOSIS — R258 Other abnormal involuntary movements: Secondary | ICD-10-CM

## 2015-01-31 DIAGNOSIS — R293 Abnormal posture: Secondary | ICD-10-CM

## 2015-01-31 DIAGNOSIS — R4189 Other symptoms and signs involving cognitive functions and awareness: Secondary | ICD-10-CM

## 2015-01-31 DIAGNOSIS — G20 Parkinson's disease: Secondary | ICD-10-CM

## 2015-01-31 DIAGNOSIS — R279 Unspecified lack of coordination: Secondary | ICD-10-CM

## 2015-01-31 DIAGNOSIS — R269 Unspecified abnormalities of gait and mobility: Secondary | ICD-10-CM | POA: Diagnosis not present

## 2015-01-31 DIAGNOSIS — R490 Dysphonia: Secondary | ICD-10-CM

## 2015-01-31 DIAGNOSIS — R278 Other lack of coordination: Secondary | ICD-10-CM

## 2015-01-31 NOTE — Therapy (Signed)
[facility] [address] [place], [place], [address] Phone: [phone]   Fax:  [phone]  Occupational Therapy Treatment  Patient Details  Name: [name] MRN: [id number] Date of Birth: 1933/07/13 No Data Recorded  Encounter Date: 01/31/2015      OT End of Session - 01/31/15 2027    Visit Number 5   Number of Visits 17   Date for OT Re-Evaluation 03/15/15   Authorization Type [employer], no visit limit, no auth required, g-code   Authorization - Visit Number 5   Authorization - Number of Visits 10   OT Start Time 1450   OT Stop Time 1530   OT Time Calculation (min) 40 min   Activity Tolerance Patient tolerated treatment well      Past Medical History  Diagnosis Date  . Hyperlipidemia   . Hypertension   . Parkinson disease ([place]) DX   2005    NEUROLOGIST-   DR TAT--  IDIOPATHIC PARKINSON'S /  TREMORS CONTROLLED WITH MEDS  . GERD (gastroesophageal reflux disease)   . Allergic rhinitis   . OAB (overactive bladder)   . Frequency of urination   . Urge urinary incontinence   . Nocturia   . BPH with elevated PSA   . Osteoarthritis     KNEES, SHOULDER  . DDD (degenerative disc disease), lumbar   . Hearing loss     does not wear his hearing aid  . Mild obstructive sleep apnea     PER PT  MILD OSA , NO CPAP RX,  STUDY DONE 2009  . Depression   . Bladder cancer [facility])     urologist-  dr eskridge--  low grade TA  . Dry mouth     USES BIOTIN SPRAY/ MOUTHWASH    Past Surgical History  Procedure Laterality Date  . Nasal sinus surgery  1982  . Inguinal hernia repair  03/09/2012    Procedure: HERNIA REPAIR INGUINAL ADULT;  Surgeon: Adin Hector, MD;  [facility];  Service: General;  Laterality: Right;  . Insertion of mesh  03/09/2012    Procedure: INSERTION OF MESH;  Surgeon: Adin Hector, MD;  [facility];  Service: General;  Laterality: Right;  . Removal benign cyst left forehead   1969  . Orif right arm fx  1949    HARDWARE REMOVED   . Cataract extraction w/ intraocular lens  implant, bilateral  2014  . Cystoscopy with stent placement Left 12/19/2013    Procedure: CYSTOSCOPY BLADDER BX, LEFT URETERAL STENT PLACEMENT , LEFT RETROGRADE AND PYLOGRAM;  Surgeon: Festus Aloe, MD;  Location: [facility];  Service: Urology;  Laterality: Left;  . Transurethral resection of bladder tumor N/A 12/19/2013    Procedure:  TRANSURETHRAL RESECTION OF BLADDER TUMOR WITH GYRUS (TURBT-GYRUS);  Surgeon: Festus Aloe, MD;  [facility];  Service: Urology;  Laterality: N/A;  . Cystoscopy w/ retrogrades Bilateral 12/14/2014    Procedure: CYSTOSCOPY BLADDER BIOPSY FULGERATION MITOMYCIN C BILATERAL RETROGRADE PYELOGRAM;  Surgeon: Festus Aloe, MD;  Location: [facility];  Service: Urology;  Laterality: Bilateral;    There were no vitals filed for this visit.  Visit Diagnosis:  Bradykinesia  Cognitive deficits  Decreased coordination  Posture abnormality      Subjective Assessment - 01/31/15 2018    Pertinent History Parkinson's disease    Patient Stated Goals unsure   Currently in Pain? No/denies  Treatment: Therapist reviewed PWR! Exercises 1-3(PWR!up, twist and rock) min-mod v.c for supine ex Pt demonstrated difficulty following instructions and keeping count. Pt became very defensive as a result of his difficulty. Therapist spent the end of tx session reinforcing the benefits of exercise, and therapist provided pt. With info re: neuro protective benefits of exercise for PD from [facility].           OT Short Term Goals - 01/14/15 1419    OT SHORT TERM GOAL #1   Title Pt will be independent with initial PD-specific HEP.--check STGs 02/13/15   Time 4   Period Weeks   Status New   OT SHORT TERM GOAL #2   Title Pt will improve coordination for ADLs as shown by improving time on  9-hole peg test by at least 5 sec with dominant LUE.   Baseline 44.63sec   Time 4   Period Weeks   Status New   OT SHORT TERM GOAL #3   Title Pt will report incr ease with eating using adaptive strategies prn.   Time 4   Period Weeks   Status New   OT SHORT TERM GOAL #4   Title Pt will verbalize understanding of ways to prevent future complications and PD-related community resources.   Time 4   Period Weeks   Status New   OT SHORT TERM GOAL #5   Title Pt will verbalize understanding of cognitive compensation strategies/ways to promote cognitive skills prn.   Time 8   Period Weeks   Status New           OT Long Term Goals - 01/22/15 1651    OT LONG TERM GOAL #1   Title Pt will verbalize understanding of AE/strategies to increase ease/safety with ADLs/IADLs prn.   Time 8   Period Weeks   Status New   OT LONG TERM GOAL #2   Title Pt will improve coordination for ADLs as shown by improving time on 9-hole peg test by at least 10 sec with dominant LUE.   Baseline L-44.63sec   Time 8   Period Weeks   Status New   OT LONG TERM GOAL #3   Title Pt will improve functional reaching/coordination as shown by improving time on box and blocks test by at least 5 bilaterally.   Baseline 44 bilaterally 01/22/15   Time 8   Period Weeks   Status New   OT LONG TERM GOAL #4   Title Pt will report incr ease with buttons, zippers, tying, belt.   Time 4   Period Days   Status New   OT LONG TERM GOAL #5   Title Pt will improve dressing ease as shown by improving time on PPT#4 by at least 5sec.   Baseline 24.77   Time 8   Period Weeks   Status New   OT LONG TERM GOAL #6   Title Pt will improve balance/functional reaching for ADLs as shown by improving standing functional reach test to at least 11inches with each UE.   Baseline R-9", L-10"   Time 8   Period Weeks   Status New               Plan - 01/31/15 2025    Clinical Impression Statement Pt is progressing towards goals  limited by cognitive deficits.   Pt will benefit from skilled therapeutic intervention in order to improve on the following deficits (Retired) Decreased cognition;Decreased mobility;Impaired perceived functional ability;Impaired vision/preception;Impaired UE functional use;Decreased knowledge of  use of DME;Decreased balance;Decreased activity tolerance;Decreased coordination   Rehab Potential Good   Clinical Impairments Affecting Rehab Potential decr awareness of PD deficits, affect, cognitive deficits   OT Frequency 2x / week   OT Duration 8 weeks   OT Treatment/Interventions Self-care/ADL training;Cryotherapy;Moist Heat;Visual/perceptual remediation/compensation;Cognitive remediation/compensation;DME and/or AE instruction;Therapeutic activities;Therapeutic exercises;Energy conservation;Neuromuscular education;Manual Therapy;Splinting;Functional Mobility Training;Patient/family education;Therapeutic exercise   Plan review modified quadraped PWR! moves   OT Home Exercise Plan Education provided:  01/21/15 [place]!; 01/22/15 Supine PWR!   Consulted and Agree with Plan of Care Patient        Problem List Patient Active Problem List   Diagnosis Date Noted  . Bladder cancer ([place]) 04/26/2014  . Right inguinal hernia 09/10/2011  . Direct inguinal hernia 08/07/2011  . Urinary frequency 03/18/2011  . Skin lesion 03/18/2011  . General medical examination 03/18/2011  . Cerumen impaction 12/04/2010  . Nail fungus 10/14/2010  . Fatigue 10/14/2010  . RHINITIS 02/12/2010  . GERD 12/03/2009  . Depression 08/28/2009  . WEIGHT LOSS 08/28/2009  . PSA, INCREASED 06/11/2009  . BACK PAIN 01/09/2009  . NEOPLASM OF UNCERTAIN BEHAVIOR OF SKIN 07/05/2008  . PARKINSON'S DISEASE 05/13/2007  . Hyperlipidemia 02/28/2007  . ERECTILE DYSFUNCTION 02/28/2007  . Essential hypertension 02/28/2007  . OSTEOARTHRITIS 02/28/2007    RINE,KATHRYN 01/31/2015, 8:28 PM Theone Murdoch, OTR/L [phone]  [phone] Phone: [phone] 8:28 PM 01/31/2015 [facility] [address] [place], [place], [address] Phone: [phone]   Fax:  [phone]  Name: [name] MRN: [id number] Date of Birth: [birth year]

## 2015-01-31 NOTE — Therapy (Signed)
[facility] [address] [place], [place], [address] Phone: [phone]   Fax:  [phone]  Speech Language Pathology Treatment  Patient Details  Name: [name] MRN: [id number] Date of Birth: 12-26-1933 Referring Provider:  Midge Minium, MD  Encounter Date: 01/31/2015      End of Session - 01/31/15 1444    Visit Number 4   Number of Visits 17   Date for SLP Re-Evaluation 03/11/15   Authorization Type Blue medicare   Authorization Time Period Notes from [place] state no auth required for ST   SLP Start Time 1403   SLP Stop Time  1445   SLP Time Calculation (min) 42 min   Activity Tolerance Patient tolerated treatment well      Past Medical History  Diagnosis Date  . Hyperlipidemia   . Hypertension   . Parkinson disease ([place]) DX   2005    NEUROLOGIST-   DR TAT--  IDIOPATHIC PARKINSON'S /  TREMORS CONTROLLED WITH MEDS  . GERD (gastroesophageal reflux disease)   . Allergic rhinitis   . OAB (overactive bladder)   . Frequency of urination   . Urge urinary incontinence   . Nocturia   . BPH with elevated PSA   . Osteoarthritis     KNEES, SHOULDER  . DDD (degenerative disc disease), lumbar   . Hearing loss     does not wear his hearing aid  . Mild obstructive sleep apnea     PER PT  MILD OSA , NO CPAP RX,  STUDY DONE 2009  . Depression   . Bladder cancer [facility])     urologist-  dr eskridge--  low grade TA  . Dry mouth     USES BIOTIN SPRAY/ MOUTHWASH    Past Surgical History  Procedure Laterality Date  . Nasal sinus surgery  1982  . Inguinal hernia repair  03/09/2012    Procedure: HERNIA REPAIR INGUINAL ADULT;  Surgeon: Adin Hector, MD;  [facility];  Service: General;  Laterality: Right;  . Insertion of mesh  03/09/2012    Procedure: INSERTION OF MESH;  Surgeon: Adin Hector, MD;  [facility];  Service: General;  Laterality: Right;  . Removal benign cyst left forehead  1969  .  Orif right arm fx  1949    HARDWARE REMOVED   . Cataract extraction w/ intraocular lens  implant, bilateral  2014  . Cystoscopy with stent placement Left 12/19/2013    Procedure: CYSTOSCOPY BLADDER BX, LEFT URETERAL STENT PLACEMENT , LEFT RETROGRADE AND PYLOGRAM;  Surgeon: Festus Aloe, MD;  Location: [facility];  Service: Urology;  Laterality: Left;  . Transurethral resection of bladder tumor N/A 12/19/2013    Procedure:  TRANSURETHRAL RESECTION OF BLADDER TUMOR WITH GYRUS (TURBT-GYRUS);  Surgeon: Festus Aloe, MD;  Location: [facility];  Service: Urology;  Laterality: N/A;  . Cystoscopy w/ retrogrades Bilateral 12/14/2014    Procedure: CYSTOSCOPY BLADDER BIOPSY FULGERATION MITOMYCIN C BILATERAL RETROGRADE PYELOGRAM;  Surgeon: Festus Aloe, MD;  Location: [facility];  Service: Urology;  Laterality: Bilateral;    There were no vitals filed for this visit.  Visit Diagnosis: Hypokinetic Parkinsonian dysphonia      Subjective Assessment - 01/31/15 1410    Subjective "I do my /a/ in the shower"   Currently in Pain? No/denies               ADULT SLP TREATMENT - 01/31/15 1412  General Information   Behavior/Cognition Alert;Cooperative;Pleasant mood   Treatment Provided   Treatment provided Cognitive-Linquistic   Pain Assessment   Pain Assessment No/denies pain   Cognitive-Linquistic Treatment   Treatment focused on Dysarthria   Skilled Treatment Loud /a/ average 85dB with rare min cues. Mildly complex conversation with givien topic  with average of 70dB - over 15 minutes. Pt  judged his voice effort to be inconsistent.     Assessment / Recommendations / Plan   Plan Continue with current plan of care   Progression Toward Goals   Progression toward goals Progressing toward goals          SLP Education - 01/31/15 1442    Education provided Yes   Education Details Speech WNL volume   Person(s) Educated Patient    Methods Explanation   Comprehension Verbalized understanding;Returned demonstration          SLP Short Term Goals - 01/31/15 1443    SLP SHORT TERM GOAL #1   Title Pt will demonsrate loud /a/ average  of 90dB over 3 sessions with rare min A   Time 3   Period Weeks   Status On-going   SLP SHORT TERM GOAL #2   Title Pt will average 70dB for structured speech tasks with occasional min A   Status Achieved          SLP Long Term Goals - 01/31/15 1443    SLP LONG TERM GOAL #1   Title Pt will maintain average of 70dB during conversation over 10 minutes with occasional min A over two sessions   Time 7   Period Weeks   Status Revised   SLP LONG TERM GOAL #2   Title Pt will be audible at conversation level over 12 minuts in noisy environment with occasional min A over two sessions   Time 7   Period Weeks   Status Revised   SLP LONG TERM GOAL #3   Title Tolerate regular diet/thin liquids with general swallow precautions with rare min A per pt report over 2 sessions.    Time 7   Period Weeks   Status On-going          Plan - 01/31/15 1443    Clinical Impression Statement Pt was able to maintain 70dB in approx 20 minutes simple -mod complex conversation.  Suspect therapy course will be shorter than 4-6 weeks. Skilled ST needed to cont to ensure louder WNL speech is carried over to conversation outside [place] room.   Speech Therapy Frequency 2x / week   Treatment/Interventions SLP instruction and feedback;Compensatory strategies;Patient/family education;Functional tasks;Internal/external aids   Potential to Achieve Goals Good   Potential Considerations Ability to learn/carryover information        Problem List Patient Active Problem List   Diagnosis Date Noted  . Bladder cancer ([place]) 04/26/2014  . Right inguinal hernia 09/10/2011  . Direct inguinal hernia 08/07/2011  . Urinary frequency 03/18/2011  . Skin lesion 03/18/2011  . General medical examination 03/18/2011  .  Cerumen impaction 12/04/2010  . Nail fungus 10/14/2010  . Fatigue 10/14/2010  . RHINITIS 02/12/2010  . GERD 12/03/2009  . Depression 08/28/2009  . WEIGHT LOSS 08/28/2009  . PSA, INCREASED 06/11/2009  . BACK PAIN 01/09/2009  . NEOPLASM OF UNCERTAIN BEHAVIOR OF SKIN 07/05/2008  . PARKINSON'S DISEASE 05/13/2007  . Hyperlipidemia 02/28/2007  . ERECTILE DYSFUNCTION 02/28/2007  . Essential hypertension 02/28/2007  . OSTEOARTHRITIS 02/28/2007    Elicia Lui, Annye Rusk MS, CCC-SLP 01/31/2015, 2:45 PM  [facility] [address] [place], [place], [address] Phone: [phone]   Fax:  [phone]

## 2015-01-31 NOTE — Patient Instructions (Signed)
  Continue loud /a/, conversation with focused loudness every day

## 2015-01-31 NOTE — Therapy (Signed)
[facility] [address] [place], [place], [address] Phone: [phone]   Fax:  [phone]  Occupational Therapy Treatment  Patient Details  Name: [name] MRN: [id number] Date of Birth: [birth year] Referring Provider:  Midge Minium, MD  Encounter Date: 01/30/2015      OT End of Session - 01/31/15 1504    Visit Number 4   Number of Visits 17   Date for OT Re-Evaluation 03/15/15   Authorization Type Blue Medicare, no visit limit, no auth required, g-code   Authorization - Visit Number 4   Authorization - Number of Visits 10   OT Start Time 1540   OT Stop Time 1615   OT Time Calculation (min) 35 min   Activity Tolerance Patient tolerated treatment well   Behavior During Therapy [facility] for tasks assessed/performed      Past Medical History  Diagnosis Date  . Hyperlipidemia   . Hypertension   . Parkinson disease ([place]) DX   2005    NEUROLOGIST-   DR TAT--  IDIOPATHIC PARKINSON'S /  TREMORS CONTROLLED WITH MEDS  . GERD (gastroesophageal reflux disease)   . Allergic rhinitis   . OAB (overactive bladder)   . Frequency of urination   . Urge urinary incontinence   . Nocturia   . BPH with elevated PSA   . Osteoarthritis     KNEES, SHOULDER  . DDD (degenerative disc disease), lumbar   . Hearing loss     does not wear his hearing aid  . Mild obstructive sleep apnea     PER PT  MILD OSA , NO CPAP RX,  STUDY DONE 2009  . Depression   . Bladder cancer [facility])     urologist-  dr eskridge--  low grade TA  . Dry mouth     USES BIOTIN SPRAY/ MOUTHWASH    Past Surgical History  Procedure Laterality Date  . Nasal sinus surgery  1982  . Inguinal hernia repair  03/09/2012    Procedure: HERNIA REPAIR INGUINAL ADULT;  Surgeon: Adin Hector, MD;  Location: [facility];  Service: General;  Laterality: Right;  . Insertion of mesh  03/09/2012    Procedure: INSERTION OF MESH;  Surgeon: Adin Hector, MD;  Location:  [facility];  Service: General;  Laterality: Right;  . Removal benign cyst left forehead  1969  . Orif right arm fx  1949    HARDWARE REMOVED   . Cataract extraction w/ intraocular lens  implant, bilateral  2014  . Cystoscopy with stent placement Left 12/19/2013    Procedure: CYSTOSCOPY BLADDER BX, LEFT URETERAL STENT PLACEMENT , LEFT RETROGRADE AND PYLOGRAM;  Surgeon: Festus Aloe, MD;  Location: [facility];  Service: Urology;  Laterality: Left;  . Transurethral resection of bladder tumor N/A 12/19/2013    Procedure:  TRANSURETHRAL RESECTION OF BLADDER TUMOR WITH GYRUS (TURBT-GYRUS);  Surgeon: Festus Aloe, MD;  Location: [facility];  Service: Urology;  Laterality: N/A;  . Cystoscopy w/ retrogrades Bilateral 12/14/2014    Procedure: CYSTOSCOPY BLADDER BIOPSY FULGERATION MITOMYCIN C BILATERAL RETROGRADE PYELOGRAM;  Surgeon: Festus Aloe, MD;  Location: [facility];  Service: Urology;  Laterality: Bilateral;    There were no vitals filed for this visit.  Visit Diagnosis:  Bradykinesia  Posture abnormality  Cognitive deficits  Decreased coordination      Subjective Assessment - 01/30/15 1559    Subjective  Pt reports stretching in his back and neck  Pertinent History Parkinson's disease    Patient Stated Goals unsure   Currently in Pain? No/denies      Treatment: PWR! Exercises in supine 10-20 reps each, mod v.c./ demonstration. Increased time required. Therapist provided instruction regarding how exercises pertain to bed mobility and importance of finger extension. Pt demonstrates significant internal distraction.                          OT Short Term Goals - 01/14/15 1419    OT SHORT TERM GOAL #1   Title Pt will be independent with initial PD-specific HEP.--check STGs 02/13/15   Time 4   Period Weeks   Status New   OT SHORT TERM GOAL #2   Title Pt will improve coordination for ADLs as shown by  improving time on 9-hole peg test by at least 5 sec with dominant LUE.   Baseline 44.63sec   Time 4   Period Weeks   Status New   OT SHORT TERM GOAL #3   Title Pt will report incr ease with eating using adaptive strategies prn.   Time 4   Period Weeks   Status New   OT SHORT TERM GOAL #4   Title Pt will verbalize understanding of ways to prevent future complications and PD-related community resources.   Time 4   Period Weeks   Status New   OT SHORT TERM GOAL #5   Title Pt will verbalize understanding of cognitive compensation strategies/ways to promote cognitive skills prn.   Time 8   Period Weeks   Status New           OT Long Term Goals - 01/22/15 1651    OT LONG TERM GOAL #1   Title Pt will verbalize understanding of AE/strategies to increase ease/safety with ADLs/IADLs prn.   Time 8   Period Weeks   Status New   OT LONG TERM GOAL #2   Title Pt will improve coordination for ADLs as shown by improving time on 9-hole peg test by at least 10 sec with dominant LUE.   Baseline L-44.63sec   Time 8   Period Weeks   Status New   OT LONG TERM GOAL #3   Title Pt will improve functional reaching/coordination as shown by improving time on box and blocks test by at least 5 bilaterally.   Baseline 44 bilaterally 01/22/15   Time 8   Period Weeks   Status New   OT LONG TERM GOAL #4   Title Pt will report incr ease with buttons, zippers, tying, belt.   Time 4   Period Days   Status New   OT LONG TERM GOAL #5   Title Pt will improve dressing ease as shown by improving time on PPT#4 by at least 5sec.   Baseline 24.77   Time 8   Period Weeks   Status New   OT LONG TERM GOAL #6   Title Pt will improve balance/functional reaching for ADLs as shown by improving standing functional reach test to at least 11inches with each UE.   Baseline R-9", L-10"   Time 8   Period Weeks   Status New               Plan - 01/31/15 1459    Clinical Impression Statement Pt requires  increased time and verbal cueing for PWR! moves exercises due to cognitive deficits    Pt will benefit from skilled therapeutic intervention in order to  improve on the following deficits (Retired) Decreased cognition;Decreased mobility;Impaired perceived functional ability;Impaired vision/preception;Impaired UE functional use;Decreased knowledge of use of DME;Decreased balance;Decreased activity tolerance;Decreased coordination   Rehab Potential Good   Clinical Impairments Affecting Rehab Potential decr awareness of PD deficits, affect, cognitive deficits   OT Frequency 2x / week   OT Treatment/Interventions Self-care/ADL training;Cryotherapy;Moist Heat;Visual/perceptual remediation/compensation;Cognitive remediation/compensation;DME and/or AE instruction;Therapeutic activities;Therapeutic exercises;Energy conservation;Neuromuscular education;Manual Therapy;Splinting;Functional Mobility Training;Patient/family education;Therapeutic exercise   Plan review supine PWR! moves   OT Home Exercise Plan Education provided:  01/21/15 Karn Cassis!; 01/22/15 Supine PWR!   Consulted and Agree with Plan of Care Patient        Problem List Patient Active Problem List   Diagnosis Date Noted  . Bladder cancer ([place]) 04/26/2014  . Right inguinal hernia 09/10/2011  . Direct inguinal hernia 08/07/2011  . Urinary frequency 03/18/2011  . Skin lesion 03/18/2011  . General medical examination 03/18/2011  . Cerumen impaction 12/04/2010  . Nail fungus 10/14/2010  . Fatigue 10/14/2010  . RHINITIS 02/12/2010  . GERD 12/03/2009  . Depression 08/28/2009  . WEIGHT LOSS 08/28/2009  . PSA, INCREASED 06/11/2009  . BACK PAIN 01/09/2009  . NEOPLASM OF UNCERTAIN BEHAVIOR OF SKIN 07/05/2008  . PARKINSON'S DISEASE 05/13/2007  . Hyperlipidemia 02/28/2007  . ERECTILE DYSFUNCTION 02/28/2007  . Essential hypertension 02/28/2007  . OSTEOARTHRITIS 02/28/2007    Daesean Lazarz 01/31/2015, 3:35 PM Theone Murdoch,  OTR/L [phone] Phone: [phone] 3:53 PM 01/31/2015 [facility] [address] [place], [place], [address] Phone: [phone]   Fax:  [phone]

## 2015-02-01 NOTE — Therapy (Signed)
[facility] [address] [place], [place], [address] Phone: [phone]   Fax:  [phone]  Physical Therapy Treatment  Patient Details  Name: [name] MRN: [id number] Date of Birth: [birth year] No Data Recorded  Encounter Date: 01/30/2015      PT End of Session - 02/01/15 1408    Visit Number 3   Number of Visits 17   Date for PT Re-Evaluation 03/17/15   Authorization Type Blue Medicare-G-code every 10th visit   PT Start Time 1403   PT Stop Time 1445   PT Time Calculation (min) 42 min   Activity Tolerance Patient tolerated treatment well   Behavior During Therapy [facility] for tasks assessed/performed      Past Medical History  Diagnosis Date  . Hyperlipidemia   . Hypertension   . Parkinson disease ([place]) DX   2005    NEUROLOGIST-   DR TAT--  IDIOPATHIC PARKINSON'S /  TREMORS CONTROLLED WITH MEDS  . GERD (gastroesophageal reflux disease)   . Allergic rhinitis   . OAB (overactive bladder)   . Frequency of urination   . Urge urinary incontinence   . Nocturia   . BPH with elevated PSA   . Osteoarthritis     KNEES, SHOULDER  . DDD (degenerative disc disease), lumbar   . Hearing loss     does not wear his hearing aid  . Mild obstructive sleep apnea     PER PT  MILD OSA , NO CPAP RX,  STUDY DONE 2009  . Depression   . Bladder cancer [facility])     urologist-  dr eskridge--  low grade TA  . Dry mouth     USES BIOTIN SPRAY/ MOUTHWASH    Past Surgical History  Procedure Laterality Date  . Nasal sinus surgery  1982  . Inguinal hernia repair  03/09/2012    Procedure: HERNIA REPAIR INGUINAL ADULT;  Surgeon: Adin Hector, MD;  [facility];  Service: General;  Laterality: Right;  . Insertion of mesh  03/09/2012    Procedure: INSERTION OF MESH;  Surgeon: Adin Hector, MD;  [facility];  Service: General;  Laterality: Right;  . Removal benign cyst left forehead  1969  . Orif right arm fx  1949   HARDWARE REMOVED   . Cataract extraction w/ intraocular lens  implant, bilateral  2014  . Cystoscopy with stent placement Left 12/19/2013    Procedure: CYSTOSCOPY BLADDER BX, LEFT URETERAL STENT PLACEMENT , LEFT RETROGRADE AND PYLOGRAM;  Surgeon: Festus Aloe, MD;  Location: [facility];  Service: Urology;  Laterality: Left;  . Transurethral resection of bladder tumor N/A 12/19/2013    Procedure:  TRANSURETHRAL RESECTION OF BLADDER TUMOR WITH GYRUS (TURBT-GYRUS);  Surgeon: Festus Aloe, MD;  Location: [facility];  Service: Urology;  Laterality: N/A;  . Cystoscopy w/ retrogrades Bilateral 12/14/2014    Procedure: CYSTOSCOPY BLADDER BIOPSY FULGERATION MITOMYCIN C BILATERAL RETROGRADE PYELOGRAM;  Surgeon: Festus Aloe, MD;  Location: [facility];  Service: Urology;  Laterality: Bilateral;    There were no vitals filed for this visit.  Visit Diagnosis:  Bradykinesia  Decreased coordination  Posture abnormality      Subjective Assessment - 02/01/15 1403    Subjective Trying the exercises-I'm having a little more soreness   Currently in Pain? Yes   Pain Score 4    Pain Location Back   Pain Orientation Lower   Pain Descriptors / Indicators Aching   Pain  Type Chronic pain   Pain Frequency Intermittent   Aggravating Factors  stretching and exercise aggravate pain   Pain Relieving Factors taking it easy/not overdoing it   Effect of Pain on Daily Activities PT will monitor, but will not address as a goal at this time.                     Neuro Re-ed:  PWR! Moves in [place] [facility]) - 02/01/15 1404    PWR! Up 20   PWR! Rock 20   PWR! Twist 20   PWR Step 20   Comments Pt requires max verbal and tactile cues for PWR! Up and PWR! Rock in standing.  He requires multiple trials of redirecting to the repetition of the exercise.  Pt has difficulty sequencing and being consecutive with repetition sequencing in the  PWR! Moves exercises in standing.             PT Education - 02/01/15 1406    Education provided Yes   Education Details Reviewed PWR! MOves in standing; reiterated importance of PWR! Moves to target PD-specific deficits   Person(s) Educated Patient   Methods Explanation;Demonstration   Comprehension Verbalized understanding;Returned demonstration;Tactile cues required;Need further instruction;Verbal cues required  max cues to continue with each exercise; pt stops multiple times during exercise to ask questions, despite that he is performing exercises fairly correctly          PT Short Term Goals - 01/16/15 1123    PT SHORT TERM GOAL #1   Title Pt will perform HEP for improved functional mobility, gait, balance and transfers.  TARGET 02/14/15   Time 4   Period Weeks   Status New   PT SHORT TERM GOAL #2   Title Pt will improve 5x sit<>stand to less than or equal to 18 seconds for improved transfer efficiency and safety.   Time 4   Period Weeks   Status New   PT SHORT TERM GOAL #3   Title Pt will improve TUG score to less than or equal to 13.5 seconds for decreased fall risk.   Time 4   Period Weeks   Status New   PT SHORT TERM GOAL #4   Title Pt will improve gait velocity to at least 2.8 ft/sec for improved gait efficiency and safety.   Time 4   Period Weeks   Status New           PT Long Term Goals - 01/16/15 1125    PT LONG TERM GOAL #1   Title Pt will verbalize understanding of fall prevention in the home environment.   TARGET 03/17/15   Time 8   Period Weeks   Status New   PT LONG TERM GOAL #2   Title Pt will improve 5x sit<>stand to less than or equal to 15 seconds for improved efficiency and safety with transfers.   Time 8   Period Weeks   Status New   PT LONG TERM GOAL #3   Title Pt will improve TUG cognitive score to less than or equal to 15 seconds for decreased fall risk and improved ability for dual tasking.   Time 8   Period Weeks   Status New    PT LONG TERM GOAL #4   Title Pt will improve Functional Gait Assessment score to at least 20/30 for improved functional mobility and decreased fall risk.   Time 8   Period Weeks  Status New   PT LONG TERM GOAL #5   Title Pt will verbalize plans for continue community fitness upon D/C from PT.   Time 8   Period Weeks   Status New               Plan - 02/01/15 1409    Clinical Impression Statement Pt requires max cues for performance of exercises today.  He appears to be physically performing the exercises well; however, he stops nearly every repetition to ask questions regarding technique.  He has significant difficulty with attending to the task of consecutively performing each exercise 10-20 reps.  Most of today's session was used to reinforce correct performance of HEP.   Pt will benefit from skilled therapeutic intervention in order to improve on the following deficits Abnormal gait;Decreased balance;Decreased mobility;Decreased strength;Difficulty walking;Postural dysfunction   Rehab Potential Good   PT Frequency 2x / week   PT Duration 8 weeks   PT Treatment/Interventions ADLs/Self Care Home Management;Therapeutic exercise;Therapeutic activities;Functional mobility training;Gait training;Balance training;Neuromuscular re-education;Patient/family education   PT Next Visit Plan Review standing PWR!Marland Kitchen   Transfer training. FUNCTIONAL MOVEMENTS; reinforce community exercise/walking program (potentially use PWR! Moves video on You-tube)   Consulted and Agree with Plan of Care Patient        Problem List Patient Active Problem List   Diagnosis Date Noted  . Bladder cancer ([place]) 04/26/2014  . Right inguinal hernia 09/10/2011  . Direct inguinal hernia 08/07/2011  . Urinary frequency 03/18/2011  . Skin lesion 03/18/2011  . General medical examination 03/18/2011  . Cerumen impaction 12/04/2010  . Nail fungus 10/14/2010  . Fatigue 10/14/2010  . RHINITIS 02/12/2010  . GERD  12/03/2009  . Depression 08/28/2009  . WEIGHT LOSS 08/28/2009  . PSA, INCREASED 06/11/2009  . BACK PAIN 01/09/2009  . NEOPLASM OF UNCERTAIN BEHAVIOR OF SKIN 07/05/2008  . PARKINSON'S DISEASE 05/13/2007  . Hyperlipidemia 02/28/2007  . ERECTILE DYSFUNCTION 02/28/2007  . Essential hypertension 02/28/2007  . OSTEOARTHRITIS 02/28/2007    Shakara Tweedy W. 02/01/2015, 2:12 PM  [facility]razier Butt., PT [facility] [address] [place], [place], [address] Phone: [phone]   Fax:  [phone]  Name: [name] MRN: [id number] Date of Birth: Nov 13, 1933

## 2015-02-05 ENCOUNTER — Ambulatory Visit: Payer: Medicare Other | Admitting: Speech Pathology

## 2015-02-05 ENCOUNTER — Encounter: Payer: Self-pay | Admitting: Occupational Therapy

## 2015-02-05 ENCOUNTER — Ambulatory Visit: Payer: Medicare Other | Admitting: Occupational Therapy

## 2015-02-05 ENCOUNTER — Ambulatory Visit: Payer: Medicare Other | Admitting: Physical Therapy

## 2015-02-05 VITALS — HR 87

## 2015-02-05 DIAGNOSIS — R279 Unspecified lack of coordination: Secondary | ICD-10-CM

## 2015-02-05 DIAGNOSIS — R258 Other abnormal involuntary movements: Secondary | ICD-10-CM

## 2015-02-05 DIAGNOSIS — R278 Other lack of coordination: Secondary | ICD-10-CM

## 2015-02-05 DIAGNOSIS — R293 Abnormal posture: Secondary | ICD-10-CM

## 2015-02-05 DIAGNOSIS — R269 Unspecified abnormalities of gait and mobility: Secondary | ICD-10-CM | POA: Diagnosis not present

## 2015-02-05 DIAGNOSIS — R490 Dysphonia: Secondary | ICD-10-CM

## 2015-02-05 DIAGNOSIS — G20 Parkinson's disease: Secondary | ICD-10-CM

## 2015-02-05 NOTE — Therapy (Signed)
[facility] [address] [place], [place], [address] Phone: [phone]   Fax:  [phone]  Physical Therapy Treatment  Patient Details  Name: [name] MRN: [id number] Date of Birth: 07-27-1933 No Data Recorded  Encounter Date: 02/05/2015      PT End of Session - 02/05/15 1456    Visit Number 4   Number of Visits 17   Date for PT Re-Evaluation 03/17/15   Authorization Type Blue Medicare-G-code every 10th visit   PT Start Time 1405   PT Stop Time 1445   PT Time Calculation (min) 40 min   Activity Tolerance Patient tolerated treatment well      Past Medical History  Diagnosis Date  . Hyperlipidemia   . Hypertension   . Parkinson disease ([place]) DX   2005    NEUROLOGIST-   DR TAT--  IDIOPATHIC PARKINSON'S /  TREMORS CONTROLLED WITH MEDS  . GERD (gastroesophageal reflux disease)   . Allergic rhinitis   . OAB (overactive bladder)   . Frequency of urination   . Urge urinary incontinence   . Nocturia   . BPH with elevated PSA   . Osteoarthritis     KNEES, SHOULDER  . DDD (degenerative disc disease), lumbar   . Hearing loss     does not wear his hearing aid  . Mild obstructive sleep apnea     PER PT  MILD OSA , NO CPAP RX,  STUDY DONE 2009  . Depression   . Bladder cancer [facility])     urologist-  dr eskridge--  low grade TA  . Dry mouth     USES BIOTIN SPRAY/ MOUTHWASH    Past Surgical History  Procedure Laterality Date  . Nasal sinus surgery  1982  . Inguinal hernia repair  03/09/2012    Procedure: HERNIA REPAIR INGUINAL ADULT;  Surgeon: Adin Hector, MD;  [facility];  Service: General;  Laterality: Right;  . Insertion of mesh  03/09/2012    Procedure: INSERTION OF MESH;  Surgeon: Adin Hector, MD;  [facility];  Service: General;  Laterality: Right;  . Removal benign cyst left forehead  1969  . Orif right arm fx  1949    HARDWARE REMOVED   . Cataract extraction w/ intraocular lens   implant, bilateral  2014  . Cystoscopy with stent placement Left 12/19/2013    Procedure: CYSTOSCOPY BLADDER BX, LEFT URETERAL STENT PLACEMENT , LEFT RETROGRADE AND PYLOGRAM;  Surgeon: Festus Aloe, MD;  Location[facility]  Service: Urology;  Laterality: Left;  . Transurethral resection of bladder tumor N/A 12/19/2013    Procedure:  TRANSURETHRAL RESECTION OF BLADDER TUMOR WITH GYRUS (TURBT-GYRUS);  Surgeon: Festus Aloe, MD;  Location: [facility];  Service: Urology;  Laterality: N/A;  . Cystoscopy w/ retrogrades Bilateral 12/14/2014    Procedure: CYSTOSCOPY BLADDER BIOPSY FULGERATION MITOMYCIN C BILATERAL RETROGRADE PYELOGRAM;  Surgeon: Festus Aloe, MD;  Location: [facility];  Service: Urology;  Laterality: Bilateral;    Filed Vitals:   02/05/15 1407 02/05/15 1452  Pulse: 70 87  SpO2: 99% 98%    Visit Diagnosis:  Bradykinesia      Subjective Assessment - 02/05/15 1407    Subjective Denies falls or changes.  Has been trying exercises "with limits".  Has been going to the [facility] a couple times a week and walking several times a week.   Patient Stated Goals [name] goal for therapy is to improve balance and  mobility.   Currently in Pain? Yes   Pain Score 4    Pain Location Back   Pain Orientation Lower   Pain Descriptors / Indicators Aching   Pain Type Chronic pain   Pain Frequency Intermittent   Aggravating Factors  exercises   Pain Relieving Factors rest       Viewed PWR! Moves supine and standing with patient on You Tube and reinforced functional components of each activity.  Discussed [employer] and optimal intensity level with aerobic activity.  Pt concerned that every machine measure output in different values (rpm vs watts, etc...) and continued to reinforce need to look at perceived intensity and maintaining level 7-8/10.        [facility]/Exercise - 02/05/15 1453    Knee/Hip Exercises:  Aerobic   Stationary Bike Scifit level 3.0 all 4 extremities x 8 minutes working on maintaining rpm>60 with 30 second bouts of >75 so to reinforce recommendation to work out at perceived exertion 7-8/10 of ability            PT Education - 02/05/15 1440    Education provided Yes   Education Details PWR! moves on you tube to reinforce proper technique, optimal community fitness options, PWR! supine   Person(s) Educated Patient   Methods Explanation;Demonstration;Handout   Comprehension Verbalized understanding;Other (comment)  continues to need reinforcement on importance of exercise and optimal community fitness          PT Short Term Goals - 01/16/15 1123    PT SHORT TERM GOAL #1   Title Pt will perform HEP for improved functional mobility, gait, balance and transfers.  TARGET 02/14/15   Time 4   Period Weeks   Status New   PT SHORT TERM GOAL #2   Title Pt will improve 5x sit<>stand to less than or equal to 18 seconds for improved transfer efficiency and safety.   Time 4   Period Weeks   Status New   PT SHORT TERM GOAL #3   Title Pt will improve TUG score to less than or equal to 13.5 seconds for decreased fall risk.   Time 4   Period Weeks   Status New   PT SHORT TERM GOAL #4   Title Pt will improve gait velocity to at least 2.8 ft/sec for improved gait efficiency and safety.   Time 4   Period Weeks   Status New           PT Long Term Goals - 01/16/15 1125    PT LONG TERM GOAL #1   Title Pt will verbalize understanding of fall prevention in the home environment.   TARGET 03/17/15   Time 8   Period Weeks   Status New   PT LONG TERM GOAL #2   Title Pt will improve 5x sit<>stand to less than or equal to 15 seconds for improved efficiency and safety with transfers.   Time 8   Period Weeks   Status New   PT LONG TERM GOAL #3   Title Pt will improve TUG cognitive score to less than or equal to 15 seconds for decreased fall risk and improved ability for dual  tasking.   Time 8   Period Weeks   Status New   PT LONG TERM GOAL #4   Title Pt will improve Functional Gait Assessment score to at least 20/30 for improved functional mobility and decreased fall risk.   Time 8   Period Weeks   Status  New   PT LONG TERM GOAL #5   Title Pt will verbalize plans for continue community fitness upon D/C from PT.   Time 8   Period Weeks   Status New               Plan - 02/05/15 1457    Clinical Impression Statement Pt continues to be resistant to any exercises or activity that he can not relate to functional moves.  Reviewed optimal community fitness and encouraging working out at intensity of 7-8/10 along with walking and exercises.  Pt states "i'm not going to do 10 or 20 of each.  I just do a few.  I don't see the need to do any more than that.".  Continue PT per POC.   Pt will benefit from skilled therapeutic intervention in order to improve on the following deficits Abnormal gait;Decreased balance;Decreased mobility;Decreased strength;Difficulty walking;Postural dysfunction   Rehab Potential Good   PT Frequency 2x / week   PT Duration 8 weeks   PT Treatment/Interventions ADLs/Self Care Home Management;Therapeutic exercise;Therapeutic activities;Functional mobility training;Gait training;Balance training;Neuromuscular re-education;Patient/family education   PT Next Visit Plan Review standing PWR!Marland Kitchen   Transfer training. FUNCTIONAL MOVEMENTS; revisit patients goals for PT   Consulted and Agree with Plan of Care Patient        Problem List Patient Active Problem List   Diagnosis Date Noted  . Bladder cancer ([place]) 04/26/2014  . Right inguinal hernia 09/10/2011  . Direct inguinal hernia 08/07/2011  . Urinary frequency 03/18/2011  . Skin lesion 03/18/2011  . General medical examination 03/18/2011  . Cerumen impaction 12/04/2010  . Nail fungus 10/14/2010  . Fatigue 10/14/2010  . RHINITIS 02/12/2010  . GERD 12/03/2009  . Depression  08/28/2009  . WEIGHT LOSS 08/28/2009  . PSA, INCREASED 06/11/2009  . BACK PAIN 01/09/2009  . NEOPLASM OF UNCERTAIN BEHAVIOR OF SKIN 07/05/2008  . PARKINSON'S DISEASE 05/13/2007  . Hyperlipidemia 02/28/2007  . ERECTILE DYSFUNCTION 02/28/2007  . Essential hypertension 02/28/2007  . OSTEOARTHRITIS 02/28/2007    Narda Bonds 02/05/2015, 3:00 PM  [facility] [address] [place], [place], [address] Phone: [phone]   Fax:  [phone]  Name: Jahquez Steffler MRN: [id number] Date of Birth: 20-Dec-1933    Narda Bonds, [place] 02/05/2015 3:00 PM Phone: [phone] Fax: [phone]

## 2015-02-05 NOTE — Patient Instructions (Signed)

## 2015-02-05 NOTE — Therapy (Signed)
[facility] [address] [place], [place], [address] Phone: [phone]   Fax:  [phone]  Occupational Therapy Treatment  Patient Details  Name: [name] MRN: [id number] Date of Birth: 11/20/1933 No Data Recorded  Encounter Date: 02/05/2015      OT End of Session - 02/05/15 1953    Visit Number 6   Number of Visits 17   Date for OT Re-Evaluation 03/15/15   Authorization Type Blue Medicare, no visit limit, no auth required, g-code   Authorization - Visit Number 6   Authorization - Number of Visits 10   OT Start Time 6269   OT Stop Time 1539   OT Time Calculation (min) 47 min   Activity Tolerance Patient tolerated treatment well      Past Medical History  Diagnosis Date  . Hyperlipidemia   . Hypertension   . Parkinson disease ([place]) DX   2005    NEUROLOGIST-   DR TAT--  IDIOPATHIC PARKINSON'S /  TREMORS CONTROLLED WITH MEDS  . GERD (gastroesophageal reflux disease)   . Allergic rhinitis   . OAB (overactive bladder)   . Frequency of urination   . Urge urinary incontinence   . Nocturia   . BPH with elevated PSA   . Osteoarthritis     KNEES, SHOULDER  . DDD (degenerative disc disease), lumbar   . Hearing loss     does not wear his hearing aid  . Mild obstructive sleep apnea     PER PT  MILD OSA , NO CPAP RX,  STUDY DONE 2009  . Depression   . Bladder cancer [facility])     urologist-  dr eskridge--  low grade TA  . Dry mouth     USES BIOTIN SPRAY/ MOUTHWASH    Past Surgical History  Procedure Laterality Date  . Nasal sinus surgery  1982  . Inguinal hernia repair  03/09/2012    Procedure: HERNIA REPAIR INGUINAL ADULT;  Surgeon: Adin Hector, MD;  [facility];  Service: General;  Laterality: Right;  . Insertion of mesh  03/09/2012    Procedure: INSERTION OF MESH;  Surgeon: Adin Hector, MD;  [facility];  Service: General;  Laterality: Right;  . Removal benign cyst left forehead   1969  . Orif right arm fx  1949    HARDWARE REMOVED   . Cataract extraction w/ intraocular lens  implant, bilateral  2014  . Cystoscopy with stent placement Left 12/19/2013    Procedure: CYSTOSCOPY BLADDER BX, LEFT URETERAL STENT PLACEMENT , LEFT RETROGRADE AND PYLOGRAM;  Surgeon: Festus Aloe, MD;  Location: [facility];  Service: Urology;  Laterality: Left;  . Transurethral resection of bladder tumor N/A 12/19/2013    Procedure:  TRANSURETHRAL RESECTION OF BLADDER TUMOR WITH GYRUS (TURBT-GYRUS);  Surgeon: Festus Aloe, MD;  Location: [facility];  Service: Urology;  Laterality: N/A;  . Cystoscopy w/ retrogrades Bilateral 12/14/2014    Procedure: CYSTOSCOPY BLADDER BIOPSY FULGERATION MITOMYCIN C BILATERAL RETROGRADE PYELOGRAM;  Surgeon: Festus Aloe, MD;  [facility];  Service: Urology;  Laterality: Bilateral;    There were no vitals filed for this visit.  Visit Diagnosis:  Bradykinesia  Posture abnormality  Decreased coordination      Subjective Assessment - 02/05/15 1940    Subjective  Pt reports that his back was sore last week and so he didn't do exercises   Pertinent History Parkinson's disease    Patient Stated Goals  unsure   Currently in Pain? No/denies                      OT Treatments/Exercises (OP) - 02/05/15 1945    ADLs   Overall ADLs Pt instructed in importance of exercise and large amplitude movements/trunk movement with reaching to prevent future complications, how PD can contribute to shoulder injuries and back pain, general PD etiology.  Pt verbalized understanding.   Functional Mobility Pt instructed in proper technique for supine>sit and translation of PWR! moves into bed mobility. Pt needed min-mod cues and min facilitation for supine>sit using proper technique to prevent back pain.  Pt verbalized understanding.   ADL Comments Pt instructed in [employer] Parkinson's community group.  Pt  verbalized understanding.           PWR [facility]) - 02/05/15 1943    PWR! exercises Moves in supine;Moves in [place]! Up x10   PWR! Rock x10   PWR! Twist x5 to each side   PWR! Step x10 to each side   Comments in quadraped:  min-mod cues for big movements, proper technique   PWR! Up x10    PWR! Rock x10 to each side   PWR! Twist x10 to each side   PWR! Step x6 to each side   Comments in supine:  min-mod cues for big movements and proper technique             OT Education - 02/05/15 1941    Education Details Reviewed PWR! moves in supine and quadraped to reinforce proper technique and big amplitude movements.  Recommended pt perform PWR! moves (basic 4) in at least 2 positions (supine, quadraped, standing) per day with at least 5 reps each.   Person(s) Educated Patient   Methods Explanation;Demonstration;Verbal cues   Comprehension Verbalized understanding;Returned demonstration;Verbal cues required  min-mod cueing           OT Short Term Goals - 01/14/15 1419    OT SHORT TERM GOAL #1   Title Pt will be independent with initial PD-specific HEP.--check STGs 02/13/15   Time 4   Period Weeks   Status New   OT SHORT TERM GOAL #2   Title Pt will improve coordination for ADLs as shown by improving time on 9-hole peg test by at least 5 sec with dominant LUE.   Baseline 44.63sec   Time 4   Period Weeks   Status New   OT SHORT TERM GOAL #3   Title Pt will report incr ease with eating using adaptive strategies prn.   Time 4   Period Weeks   Status New   OT SHORT TERM GOAL #4   Title Pt will verbalize understanding of ways to prevent future complications and PD-related community resources.   Time 4   Period Weeks   Status New   OT SHORT TERM GOAL #5   Title Pt will verbalize understanding of cognitive compensation strategies/ways to promote cognitive skills prn.   Time 8   Period Weeks   Status New           OT Long Term Goals - 01/22/15 1651    OT  LONG TERM GOAL #1   Title Pt will verbalize understanding of AE/strategies to increase ease/safety with ADLs/IADLs prn.   Time 8   Period Weeks   Status New   OT LONG TERM GOAL #2   Title Pt will improve coordination for ADLs as shown by improving time on  9-hole peg test by at least 10 sec with dominant LUE.   Baseline L-44.63sec   Time 8   Period Weeks   Status New   OT LONG TERM GOAL #3   Title Pt will improve functional reaching/coordination as shown by improving time on box and blocks test by at least 5 bilaterally.   Baseline 44 bilaterally 01/22/15   Time 8   Period Weeks   Status New   OT LONG TERM GOAL #4   Title Pt will report incr ease with buttons, zippers, tying, belt.   Time 4   Period Days   Status New   OT LONG TERM GOAL #5   Title Pt will improve dressing ease as shown by improving time on PPT#4 by at least 5sec.   Baseline 24.77   Time 8   Period Weeks   Status New   OT LONG TERM GOAL #6   Title Pt will improve balance/functional reaching for ADLs as shown by improving standing functional reach test to at least 11inches with each UE.   Baseline R-9", L-10"   Time 8   Period Weeks   Status New               Plan - 02/05/15 1953    Clinical Impression Statement Pt continues to need reinforcement with PWR! Moves due to cognitive deficits.   Plan big amplitude movements with ADLs   OT Home Exercise Plan Education provided:  01/21/15 [place]!; 01/22/15 Supine PWR!; 02/05/15 ways to prevent future complication, POP community group   Consulted and Agree with Plan of Care Patient        Problem List Patient Active Problem List   Diagnosis Date Noted  . Bladder cancer ([place]) 04/26/2014  . Right inguinal hernia 09/10/2011  . Direct inguinal hernia 08/07/2011  . Urinary frequency 03/18/2011  . Skin lesion 03/18/2011  . General medical examination 03/18/2011  . Cerumen impaction 12/04/2010  . Nail fungus 10/14/2010  . Fatigue 10/14/2010  .  RHINITIS 02/12/2010  . GERD 12/03/2009  . Depression 08/28/2009  . WEIGHT LOSS 08/28/2009  . PSA, INCREASED 06/11/2009  . BACK PAIN 01/09/2009  . NEOPLASM OF UNCERTAIN BEHAVIOR OF SKIN 07/05/2008  . PARKINSON'S DISEASE 05/13/2007  . Hyperlipidemia 02/28/2007  . ERECTILE DYSFUNCTION 02/28/2007  . Essential hypertension 02/28/2007  . OSTEOARTHRITIS 02/28/2007    [facility] 02/05/2015, 7:55 PM  [facility] [address] [place], [place], [address] Phone: [phone]   Fax:  [phone]  Name: [name] MRN: [id number] Date of Birth: 1934-01-03  Vianne Bulls, OTR/L 02/05/2015 7:55 PM

## 2015-02-05 NOTE — Therapy (Signed)
[facility] [address] [place], [place], [address] Phone: [phone]   Fax:  [phone]  Speech Language Pathology Treatment  Patient Details  Name: [name] MRN: [id number] Date of Birth: 11-28-1933 No Data Recorded  Encounter Date: 02/05/2015      End of Session - 02/05/15 1402    Visit Number 5   Number of Visits 17   Date for SLP Re-Evaluation 03/11/15   Authorization Type Blue medicare   Authorization Time Period Notes from [place] state no auth required for ST   SLP Start Time 1318   SLP Stop Time  1400   SLP Time Calculation (min) 42 min   Activity Tolerance Patient tolerated treatment well      Past Medical History  Diagnosis Date  . Hyperlipidemia   . Hypertension   . Parkinson disease ([place]) DX   2005    NEUROLOGIST-   DR TAT--  IDIOPATHIC PARKINSON'S /  TREMORS CONTROLLED WITH MEDS  . GERD (gastroesophageal reflux disease)   . Allergic rhinitis   . OAB (overactive bladder)   . Frequency of urination   . Urge urinary incontinence   . Nocturia   . BPH with elevated PSA   . Osteoarthritis     KNEES, SHOULDER  . DDD (degenerative disc disease), lumbar   . Hearing loss     does not wear his hearing aid  . Mild obstructive sleep apnea     PER PT  MILD OSA , NO CPAP RX,  STUDY DONE 2009  . Depression   . Bladder cancer [facility])     urologist-  dr eskridge--  low grade TA  . Dry mouth     USES BIOTIN SPRAY/ MOUTHWASH    Past Surgical History  Procedure Laterality Date  . Nasal sinus surgery  1982  . Inguinal hernia repair  03/09/2012    Procedure: HERNIA REPAIR INGUINAL ADULT;  Surgeon: Adin Hector, MD;  [facility];  Service: General;  Laterality: Right;  . Insertion of mesh  03/09/2012    Procedure: INSERTION OF MESH;  Surgeon: Adin Hector, MD;  [facility];  Service: General;  Laterality: Right;  . Removal benign cyst left forehead  1969  . Orif right arm fx  1949   HARDWARE REMOVED   . Cataract extraction w/ intraocular lens  implant, bilateral  2014  . Cystoscopy with stent placement Left 12/19/2013    Procedure: CYSTOSCOPY BLADDER BX, LEFT URETERAL STENT PLACEMENT , LEFT RETROGRADE AND PYLOGRAM;  Surgeon: Festus Aloe, MD;  [facility];  Service: Urology;  Laterality: Left;  . Transurethral resection of bladder tumor N/A 12/19/2013    Procedure:  TRANSURETHRAL RESECTION OF BLADDER TUMOR WITH GYRUS (TURBT-GYRUS);  Surgeon: Festus Aloe, MD;  Location: [facility];  Service: Urology;  Laterality: N/A;  . Cystoscopy w/ retrogrades Bilateral 12/14/2014    Procedure: CYSTOSCOPY BLADDER BIOPSY FULGERATION MITOMYCIN C BILATERAL RETROGRADE PYELOGRAM;  Surgeon: Festus Aloe, MD;  Location: [facility];  Service: Urology;  Laterality: Bilateral;    There were no vitals filed for this visit.  Visit Diagnosis: Hypokinetic Parkinsonian dysphonia      Subjective Assessment - 02/05/15 1336    Subjective "My wife is on board with my being loud"   Currently in Pain? Yes   Pain Score 4    Pain Location Back   Pain Type Chronic pain   Pain Frequency Intermittent   Effect of Pain  on Daily Activities Took 2 ibuprofin               ADULT SLP TREATMENT - 02/05/15 1340    General Information   Behavior/Cognition Alert;Cooperative;Pleasant mood   Treatment Provided   Treatment provided Cognitive-Linquistic   Cognitive-Linquistic Treatment   Treatment focused on Dysarthria   Skilled Treatment Loud /a/ average 89dB with rare min cues. Mildly complex conversation in mildly noisy environement  average of 70dB.   Assessment / Recommendations / Plan   Plan Continue with current plan of care   Progression Toward Goals   Progression toward goals Progressing toward goals          SLP Education - 02/05/15 1351    Education provided Yes   Education Details Reviewed goals and progress, continue  loud /a/, awareness of facial expression with spouse   Person(s) Educated Patient   Methods Explanation;Demonstration   Comprehension Verbalized understanding;Returned demonstration;Verbal cues required          SLP Short Term Goals - 02/05/15 1401    SLP SHORT TERM GOAL #1   Title Pt will demonsrate loud /a/ average  of 90dB over 3 sessions with rare min A   Time 2   Period Weeks   Status On-going   SLP SHORT TERM GOAL #2   Title Pt will average 70dB for structured speech tasks with occasional min A   Status Achieved          SLP Long Term Goals - 02/05/15 1401    SLP LONG TERM GOAL #1   Title Pt will maintain average of 70dB during conversation over 10 minutes with occasional min A over two sessions   Time 6   Period Weeks   Status Achieved   SLP LONG TERM GOAL #2   Title Pt will be audible at conversation level over 12 minuts in noisy environment with occasional min A over two sessions   Time 6   Period Weeks   Status On-going   SLP LONG TERM GOAL #3   Title Tolerate regular diet/thin liquids with general swallow precautions with rare min A per pt report over 2 sessions.    Time 6   Period Weeks   Status On-going          Plan - 02/05/15 1353    Clinical Impression Statement Pt maintaining 70dB in mildly noisy environment in conversation. Continue  skilled ST 1-2 more sessions to maximize carryover of adequate volume. I suggested [name] bring his wife to therapy before he is discharged.    Speech Therapy Frequency 2x / week   Treatment/Interventions SLP instruction and feedback;Compensatory strategies;Patient/family education;Functional tasks;Internal/external aids   Potential to Achieve Goals Good   Potential Considerations Ability to learn/carryover information   Consulted and Agree with Plan of Care Patient        Problem List Patient Active Problem List   Diagnosis Date Noted  . Bladder cancer ([place]) 04/26/2014  . Right inguinal hernia 09/10/2011  .  Direct inguinal hernia 08/07/2011  . Urinary frequency 03/18/2011  . Skin lesion 03/18/2011  . General medical examination 03/18/2011  . Cerumen impaction 12/04/2010  . Nail fungus 10/14/2010  . Fatigue 10/14/2010  . RHINITIS 02/12/2010  . GERD 12/03/2009  . Depression 08/28/2009  . WEIGHT LOSS 08/28/2009  . PSA, INCREASED 06/11/2009  . BACK PAIN 01/09/2009  . NEOPLASM OF UNCERTAIN BEHAVIOR OF SKIN 07/05/2008  . PARKINSON'S DISEASE 05/13/2007  . Hyperlipidemia 02/28/2007  . ERECTILE DYSFUNCTION 02/28/2007  .  Essential hypertension 02/28/2007  . OSTEOARTHRITIS 02/28/2007    Taz Vanness, Annye Rusk MS, CCC-SLP 02/05/2015, 2:03 PM  [facility] [address] [place], [place], [address] Phone: [phone]   Fax:  [phone]   Name: [name] MRN: [id number] Date of Birth: December 04, 1933

## 2015-02-06 ENCOUNTER — Ambulatory Visit: Payer: Medicare Other | Admitting: Physical Therapy

## 2015-02-06 ENCOUNTER — Ambulatory Visit: Payer: Medicare Other

## 2015-02-06 DIAGNOSIS — G20 Parkinson's disease: Secondary | ICD-10-CM

## 2015-02-06 DIAGNOSIS — R293 Abnormal posture: Secondary | ICD-10-CM

## 2015-02-06 DIAGNOSIS — R490 Dysphonia: Secondary | ICD-10-CM

## 2015-02-06 DIAGNOSIS — R269 Unspecified abnormalities of gait and mobility: Secondary | ICD-10-CM

## 2015-02-06 DIAGNOSIS — R258 Other abnormal involuntary movements: Secondary | ICD-10-CM

## 2015-02-06 NOTE — Patient Instructions (Signed)

## 2015-02-06 NOTE — Therapy (Signed)
[facility] [address] [place], [place], [address] Phone: [phone]   Fax:  [phone]  Speech Language Pathology Treatment  Patient Details  Name: [name] MRN: [id number] Date of Birth: 1933/11/22 No Data Recorded  Encounter Date: 02/06/2015      End of Session - 02/06/15 1237    Visit Number 6   Number of Visits 17   Date for SLP Re-Evaluation 03/11/15   SLP Start Time 1148   SLP Stop Time  1231   SLP Time Calculation (min) 43 min   Activity Tolerance Patient tolerated treatment well      Past Medical History  Diagnosis Date  . Hyperlipidemia   . Hypertension   . Parkinson disease ([place]) DX   2005    NEUROLOGIST-   DR TAT--  IDIOPATHIC PARKINSON'S /  TREMORS CONTROLLED WITH MEDS  . GERD (gastroesophageal reflux disease)   . Allergic rhinitis   . OAB (overactive bladder)   . Frequency of urination   . Urge urinary incontinence   . Nocturia   . BPH with elevated PSA   . Osteoarthritis     KNEES, SHOULDER  . DDD (degenerative disc disease), lumbar   . Hearing loss     does not wear his hearing aid  . Mild obstructive sleep apnea     PER PT  MILD OSA , NO CPAP RX,  STUDY DONE 2009  . Depression   . Bladder cancer [facility])     urologist-  dr eskridge--  low grade TA  . Dry mouth     USES BIOTIN SPRAY/ MOUTHWASH    Past Surgical History  Procedure Laterality Date  . Nasal sinus surgery  1982  . Inguinal hernia repair  03/09/2012    Procedure: HERNIA REPAIR INGUINAL ADULT;  Surgeon: Adin Hector, MD;  Location: [facility];  Service: General;  Laterality: Right;  . Insertion of mesh  03/09/2012    Procedure: INSERTION OF MESH;  Surgeon: Adin Hector, MD;  Location: [facility];  Service: General;  Laterality: Right;  . Removal benign cyst left forehead  1969  . Orif right arm fx  1949    HARDWARE REMOVED   . Cataract extraction w/ intraocular lens  implant, bilateral  2014  . Cystoscopy with  stent placement Left 12/19/2013    Procedure: CYSTOSCOPY BLADDER BX, LEFT URETERAL STENT PLACEMENT , LEFT RETROGRADE AND PYLOGRAM;  Surgeon: Festus Aloe, MD;  Location: [facility];  Service: Urology;  Laterality: Left;  . Transurethral resection of bladder tumor N/A 12/19/2013    Procedure:  TRANSURETHRAL RESECTION OF BLADDER TUMOR WITH GYRUS (TURBT-GYRUS);  Surgeon: Festus Aloe, MD;  Location: [facility];  Service: Urology;  Laterality: N/A;  . Cystoscopy w/ retrogrades Bilateral 12/14/2014    Procedure: CYSTOSCOPY BLADDER BIOPSY FULGERATION MITOMYCIN C BILATERAL RETROGRADE PYELOGRAM;  Surgeon: Festus Aloe, MD;  Location[facility];  Service: Urology;  Laterality: Bilateral;    There were no vitals filed for this visit.  Visit Diagnosis: Hypokinetic Parkinsonian dysphonia      Subjective Assessment - 02/05/15 1336    Subjective "My wife is on board with my being loud"   Currently in Pain? Yes   Pain Score 4    Pain Location Back   Pain Type Chronic pain   Pain Frequency Intermittent   Effect of Pain on Daily Activities Took 2 ibuprofin  ADULT SLP TREATMENT - 02/06/15 1154    General Information   Behavior/Cognition Alert;Cooperative;Pleasant mood   Treatment Provided   Treatment provided Cognitive-Linquistic   Pain Assessment   Pain Assessment No/denies pain   Cognitive-Linquistic Treatment   Treatment focused on Dysarthria   Skilled Treatment Pt told SLP he has had no coughing/choking with meals. Pt reported his wife may be able to attend a ST visit maybe closer to the lunch hour. SLP again suggested this to pt, to [place] pt's wife with therapy tasks. Rationale of loud /a/ was reiterated to patient and he then completed loud /a/ average 90dB. Discussed with pt the need to complete x5 reps of /a/ twice a day at home in order to maintain calibration of an objective "loud" in order to grade/scale  loudness in speech. SLP educated pt further about loud /a/ ending with the need to take another breath, rather than decreasing loudness gradually. Multisentence responses (sequencing events) with 70dB average. In conversation around clinic (simple to mod complex conversation) pt understood 100% of the time with adequate volume 100%. Pt c/o xerostomia and SLP inquired re: pt's H2O intake which pt reported at approx 20 oz/day. SLP encouraged pt to incr to closer to 60 oz/day and gave some strategies to help pt achieve this.       Assessment / Recommendations / Plan   Plan Continue with current plan of care   Progression Toward Goals   Progression toward goals Progressing toward goals          SLP Education - 02/06/15 1237    Education Details rationale for loud /a/, increasing water intake to combat xerostomia   Person(s) Educated Patient   Methods Explanation   Comprehension Verbalized understanding          SLP Short Term Goals - 02/05/15 1401    SLP SHORT TERM GOAL #1   Title Pt will demonsrate loud /a/ average  of 90dB over 3 sessions with rare min A   Time 2   Period Weeks   Status On-going   SLP SHORT TERM GOAL #2   Title Pt will average 70dB for structured speech tasks with occasional min A   Status Achieved          SLP Long Term Goals - 02/05/15 1401    SLP LONG TERM GOAL #1   Title Pt will maintain average of 70dB during conversation over 10 minutes with occasional min A over two sessions   Time 6   Period Weeks   Status Achieved   SLP LONG TERM GOAL #2   Title Pt will be audible at conversation level over 12 minuts in noisy environment with occasional min A over two sessions   Time 6   Period Weeks   Status On-going   SLP LONG TERM GOAL #3   Title Tolerate regular diet/thin liquids with general swallow precautions with rare min A per pt report over 2 sessions.    Time 6   Period Weeks   Status On-going          Plan - 02/06/15 1238    Clinical  Impression Statement Pt with good to excellent success in conversation in min noisy environment. Likely d/c next session due to carryover of incr'd volume to multiple speaking situations.   Speech Therapy Frequency 2x / week   Duration --  6 weeks   Treatment/Interventions SLP instruction and feedback;Compensatory strategies;Patient/family education;Functional tasks;Internal/external aids   Potential to Achieve Goals Good  Problem List Patient Active Problem List   Diagnosis Date Noted  . Bladder cancer ([place]) 04/26/2014  . Right inguinal hernia 09/10/2011  . Direct inguinal hernia 08/07/2011  . Urinary frequency 03/18/2011  . Skin lesion 03/18/2011  . General medical examination 03/18/2011  . Cerumen impaction 12/04/2010  . Nail fungus 10/14/2010  . Fatigue 10/14/2010  . RHINITIS 02/12/2010  . GERD 12/03/2009  . Depression 08/28/2009  . WEIGHT LOSS 08/28/2009  . PSA, INCREASED 06/11/2009  . BACK PAIN 01/09/2009  . NEOPLASM OF UNCERTAIN BEHAVIOR OF SKIN 07/05/2008  . PARKINSON'S DISEASE 05/13/2007  . Hyperlipidemia 02/28/2007  . ERECTILE DYSFUNCTION 02/28/2007  . Essential hypertension 02/28/2007  . OSTEOARTHRITIS 02/28/2007    Mattie Nordell , MS, CCC-SLP  02/06/2015, 12:40 PM  [facility] [address] [place], [place], [address] Phone: [phone]   Fax:  [phone]   Name: [name] MRN: [id number] Date of Birth: [birth year]

## 2015-02-07 NOTE — Therapy (Signed)
[facility] [address] [place], [place], [address] Phone: [phone]   Fax:  [phone]  Physical Therapy Treatment  Patient Details  Name: [name] MRN: [id number] Date of Birth: 11-Feb-1934 No Data Recorded  Encounter Date: 02/06/2015      PT End of Session - 02/07/15 1406    Visit Number 5   Number of Visits 17   Date for PT Re-Evaluation 03/17/15   Authorization Type Blue Medicare-G-code every 10th visit   PT Start Time 1111   PT Stop Time 1150   PT Time Calculation (min) 39 min   Activity Tolerance Patient tolerated treatment well   Behavior During Therapy [facility] for tasks assessed/performed      Past Medical History  Diagnosis Date  . Hyperlipidemia   . Hypertension   . Parkinson disease ([place]) DX   2005    NEUROLOGIST-   DR TAT--  IDIOPATHIC PARKINSON'S /  TREMORS CONTROLLED WITH MEDS  . GERD (gastroesophageal reflux disease)   . Allergic rhinitis   . OAB (overactive bladder)   . Frequency of urination   . Urge urinary incontinence   . Nocturia   . BPH with elevated PSA   . Osteoarthritis     KNEES, SHOULDER  . DDD (degenerative disc disease), lumbar   . Hearing loss     does not wear his hearing aid  . Mild obstructive sleep apnea     PER PT  MILD OSA , NO CPAP RX,  STUDY DONE 2009  . Depression   . Bladder cancer [facility])     urologist-  dr eskridge--  low grade TA  . Dry mouth     USES BIOTIN SPRAY/ MOUTHWASH    Past Surgical History  Procedure Laterality Date  . Nasal sinus surgery  1982  . Inguinal hernia repair  03/09/2012    Procedure: HERNIA REPAIR INGUINAL ADULT;  Surgeon: Adin Hector, MD;  [facility];  Service: General;  Laterality: Right;  . Insertion of mesh  03/09/2012    Procedure: INSERTION OF MESH;  Surgeon: Adin Hector, MD;  [facility];  Service: General;  Laterality: Right;  . Removal benign cyst left forehead  1969  . Orif right arm fx  1949   HARDWARE REMOVED   . Cataract extraction w/ intraocular lens  implant, bilateral  2014  . Cystoscopy with stent placement Left 12/19/2013    Procedure: CYSTOSCOPY BLADDER BX, LEFT URETERAL STENT PLACEMENT , LEFT RETROGRADE AND PYLOGRAM;  Surgeon: Festus Aloe, MD;  Location[facility];  Service: Urology;  Laterality: Left;  . Transurethral resection of bladder tumor N/A 12/19/2013    Procedure:  TRANSURETHRAL RESECTION OF BLADDER TUMOR WITH GYRUS (TURBT-GYRUS);  Surgeon: Festus Aloe, MD;  Location: [facility];  Service: Urology;  Laterality: N/A;  . Cystoscopy w/ retrogrades Bilateral 12/14/2014    Procedure: CYSTOSCOPY BLADDER BIOPSY FULGERATION MITOMYCIN C BILATERAL RETROGRADE PYELOGRAM;  Surgeon: Festus Aloe, MD;  Location: [facility];  Service: Urology;  Laterality: Bilateral;    There were no vitals filed for this visit.  Visit Diagnosis:  Posture abnormality  Abnormality of gait  Bradykinesia      Subjective Assessment - 02/07/15 1309    Subjective Has not yet tried PWR! Moves on You Tube as assist for performing HEP.   Currently in Pain? No/denies  [place] Adult PT Treatment/Exercise - 02/06/15 1135    Transfers   Transfers Sit to Stand;Stand to Sit   Sit to Stand 5: Supervision   Sit to Stand Details Tactile cues for weight shifting  intensity, momentum, forward weightshift   Stand to Sit 5: Supervision   Number of Reps 10 reps;Other sets (comment)  3 sets; from 24", 20", 18" surfaces   Ambulation/Gait   Ambulation/Gait Yes   Ambulation/Gait Assistance 6: Modified independent (Device/Increase time)   Ambulation Distance (Feet) 400 Feet  indoors/ 800 ft walking poles outdoors   Assistive device None  bilateral walking poles-outdoors   Gait Pattern Step-through pattern  Gait pattern improved with walking poles   Ambulation Surface Level;Indoor;Unlevel;Outdoor   Gait  Comments Intermittent cues provided for upright posture and increased intensity of gait.  Discussed rating exertion/work effort level, as a means to improve intensity of walking for exercise.         Self Care:  Reviewed optimal fitness program provided last visit; reiterated information regarding intensity of exercise program and walking.      PT Education - 02/06/15 1122    Education provided Yes   Education Details Reviewed optimal fitness program with patient          PT Short Term Goals - 01/16/15 1123    PT SHORT TERM GOAL #1   Title Pt will perform HEP for improved functional mobility, gait, balance and transfers.  TARGET 02/14/15   Time 4   Period Weeks   Status New   PT SHORT TERM GOAL #2   Title Pt will improve 5x sit<>stand to less than or equal to 18 seconds for improved transfer efficiency and safety.   Time 4   Period Weeks   Status New   PT SHORT TERM GOAL #3   Title Pt will improve TUG score to less than or equal to 13.5 seconds for decreased fall risk.   Time 4   Period Weeks   Status New   PT SHORT TERM GOAL #4   Title Pt will improve gait velocity to at least 2.8 ft/sec for improved gait efficiency and safety.   Time 4   Period Weeks   Status New           PT Long Term Goals - 01/16/15 1125    PT LONG TERM GOAL #1   Title Pt will verbalize understanding of fall prevention in the home environment.   TARGET 03/17/15   Time 8   Period Weeks   Status New   PT LONG TERM GOAL #2   Title Pt will improve 5x sit<>stand to less than or equal to 15 seconds for improved efficiency and safety with transfers.   Time 8   Period Weeks   Status New   PT LONG TERM GOAL #3   Title Pt will improve TUG cognitive score to less than or equal to 15 seconds for decreased fall risk and improved ability for dual tasking.   Time 8   Period Weeks   Status New   PT LONG TERM GOAL #4   Title Pt will improve Functional Gait Assessment score to at least 20/30 for  improved functional mobility and decreased fall risk.   Time 8   Period Weeks   Status New   PT LONG TERM GOAL #5   Title Pt will verbalize plans for continue community fitness upon D/C from PT.   Time 8   Period Weeks   Status New  Plan - 02/07/15 1406    Clinical Impression Statement Pt has not followed up with PWR! Moves on You tube and is doing walking activities with wife several times a week.  Encouraged patient to use bilateral walking poles, as he already has them at home, just has not been using them.  [name] step length, posture and arm swing improve with use of walking poles.  Pt will continue to benefit from further skilled PT to further address balance and gait activities.   Pt will benefit from skilled therapeutic intervention in order to improve on the following deficits Abnormal gait;Decreased balance;Decreased mobility;Decreased strength;Difficulty walking;Postural dysfunction   Rehab Potential Good   PT Frequency 2x / week   PT Duration 8 weeks   PT Treatment/Interventions ADLs/Self Care Home Management;Therapeutic exercise;Therapeutic activities;Functional mobility training;Gait training;Balance training;Neuromuscular re-education;Patient/family education   PT Next Visit Plan transfer training, Review standing PWR!, balance strategy activities/complian surfaces, FUNCTIONAL MOVEMENT PATTERNS   Consulted and Agree with Plan of Care Patient        Problem List Patient Active Problem List   Diagnosis Date Noted  . Bladder cancer ([place]) 04/26/2014  . Right inguinal hernia 09/10/2011  . Direct inguinal hernia 08/07/2011  . Urinary frequency 03/18/2011  . Skin lesion 03/18/2011  . General medical examination 03/18/2011  . Cerumen impaction 12/04/2010  . Nail fungus 10/14/2010  . Fatigue 10/14/2010  . RHINITIS 02/12/2010  . GERD 12/03/2009  . Depression 08/28/2009  . WEIGHT LOSS 08/28/2009  . PSA, INCREASED 06/11/2009  . BACK PAIN 01/09/2009  .  NEOPLASM OF UNCERTAIN BEHAVIOR OF SKIN 07/05/2008  . PARKINSON'S DISEASE 05/13/2007  . Hyperlipidemia 02/28/2007  . ERECTILE DYSFUNCTION 02/28/2007  . Essential hypertension 02/28/2007  . OSTEOARTHRITIS 02/28/2007    Joe Chauvin W. 02/07/2015, 2:11 PM Frazier Butt., PT [facility] [address] [place], [place], [address] Phone: [phone]   Fax:  [phone]  Name: [name] MRN: [id number] Date of Birth: August 04, 1933

## 2015-02-12 ENCOUNTER — Ambulatory Visit: Payer: Medicare Other | Admitting: Physical Therapy

## 2015-02-12 ENCOUNTER — Ambulatory Visit: Payer: Medicare Other | Admitting: Occupational Therapy

## 2015-02-12 ENCOUNTER — Ambulatory Visit: Payer: Medicare Other | Admitting: Speech Pathology

## 2015-02-12 DIAGNOSIS — R278 Other lack of coordination: Secondary | ICD-10-CM

## 2015-02-12 DIAGNOSIS — G20 Parkinson's disease: Secondary | ICD-10-CM

## 2015-02-12 DIAGNOSIS — R258 Other abnormal involuntary movements: Secondary | ICD-10-CM

## 2015-02-12 DIAGNOSIS — R490 Dysphonia: Secondary | ICD-10-CM

## 2015-02-12 DIAGNOSIS — R293 Abnormal posture: Secondary | ICD-10-CM

## 2015-02-12 DIAGNOSIS — R279 Unspecified lack of coordination: Secondary | ICD-10-CM

## 2015-02-12 DIAGNOSIS — R269 Unspecified abnormalities of gait and mobility: Secondary | ICD-10-CM | POA: Diagnosis not present

## 2015-02-12 NOTE — Therapy (Signed)
[facility] [address] [place], [place], [address] Phone: [phone]   Fax:  [phone]  Occupational Therapy Treatment  Patient Details  Name: [name] MRN: [id number] Date of Birth: 02/28/1934 No Data Recorded  Encounter Date: 02/12/2015      OT End of Session - 02/12/15 1155    Visit Number 7   Number of Visits 17   Date for OT Re-Evaluation 03/15/15   Authorization Type Blue Medicare, no visit limit, no auth required, g-code   Authorization - Visit Number 7   Authorization - Number of Visits 10   OT Start Time 5093   OT Stop Time 1230   OT Time Calculation (min) 42 min   Activity Tolerance Patient tolerated treatment well   Behavior During Therapy [facility] for tasks assessed/performed      Past Medical History  Diagnosis Date  . Hyperlipidemia   . Hypertension   . Parkinson disease ([place]) DX   2005    NEUROLOGIST-   DR TAT--  IDIOPATHIC PARKINSON'S /  TREMORS CONTROLLED WITH MEDS  . GERD (gastroesophageal reflux disease)   . Allergic rhinitis   . OAB (overactive bladder)   . Frequency of urination   . Urge urinary incontinence   . Nocturia   . BPH with elevated PSA   . Osteoarthritis     KNEES, SHOULDER  . DDD (degenerative disc disease), lumbar   . Hearing loss     does not wear his hearing aid  . Mild obstructive sleep apnea     PER PT  MILD OSA , NO CPAP RX,  STUDY DONE 2009  . Depression   . Bladder cancer [facility])     urologist-  dr eskridge--  low grade TA  . Dry mouth     USES BIOTIN SPRAY/ MOUTHWASH    Past Surgical History  Procedure Laterality Date  . Nasal sinus surgery  1982  . Inguinal hernia repair  03/09/2012    Procedure: HERNIA REPAIR INGUINAL ADULT;  Surgeon: Adin Hector, MD;  [facility];  Service: General;  Laterality: Right;  . Insertion of mesh  03/09/2012    Procedure: INSERTION OF MESH;  Surgeon: Adin Hector, MD;  [facility];  Service: General;   Laterality: Right;  . Removal benign cyst left forehead  1969  . Orif right arm fx  1949    HARDWARE REMOVED   . Cataract extraction w/ intraocular lens  implant, bilateral  2014  . Cystoscopy with stent placement Left 12/19/2013    Procedure: CYSTOSCOPY BLADDER BX, LEFT URETERAL STENT PLACEMENT , LEFT RETROGRADE AND PYLOGRAM;  Surgeon: Festus Aloe, MD;  Location[facility];  Service: Urology;  Laterality: Left;  . Transurethral resection of bladder tumor N/A 12/19/2013    Procedure:  TRANSURETHRAL RESECTION OF BLADDER TUMOR WITH GYRUS (TURBT-GYRUS);  Surgeon: Festus Aloe, MD;  Location: [facility];  Service: Urology;  Laterality: N/A;  . Cystoscopy w/ retrogrades Bilateral 12/14/2014    Procedure: CYSTOSCOPY BLADDER BIOPSY FULGERATION MITOMYCIN C BILATERAL RETROGRADE PYELOGRAM;  Surgeon: Festus Aloe, MD;  Location: [facility];  Service: Urology;  Laterality: Bilateral;    There were no vitals filed for this visit.  Visit Diagnosis:  Bradykinesia  Posture abnormality  Decreased coordination      Subjective Assessment - 02/12/15 1150    Subjective  "I'm feeling my way through the exercises"   Pertinent History Parkinson's disease    Patient Stated  Goals unsure   Currently in Pain? No/denies                      OT Treatments/Exercises (OP) - 02/12/15 0001    ADLs   Eating Pt instructed in modifying hold on utensils to incr ease with eating, big movement strategies for cutting food, big movement strategies for grasp of cup and opening bottles.  Pt verbalized understanding and returned demo strategy for grasping cup.    UB Dressing Practiced big movement strategies for donning/doffing jacket.  Pt returned demo with mod cueing initially, then improved performance with repetition/cues.   Functional Mobility Pt instructed in big amplitude movement strategy for sit>stand with good success, but needed mod cueing  initially.    ADL Comments Pt instructed in rationale/use of big movement strategies for ADLs and relation to PWR! HEP   ADL Education Given Yes                OT Education - 02/12/15 1614    Education Details performing ADLs with big movement strategies for incr ease and to prevent future complications; rationale behind big amplitude movements; verbally reviewed PWR! moves (answered pt questions)   Person(s) Educated Patient   Methods Explanation;Demonstration;Verbal cues;Handout;Tactile cues   Comprehension Verbalized understanding;Returned demonstration;Verbal cues required          OT Short Term Goals - 01/14/15 1419    OT SHORT TERM GOAL #1   Title Pt will be independent with initial PD-specific HEP.--check STGs 02/13/15   Time 4   Period Weeks   Status New   OT SHORT TERM GOAL #2   Title Pt will improve coordination for ADLs as shown by improving time on 9-hole peg test by at least 5 sec with dominant LUE.   Baseline 44.63sec   Time 4   Period Weeks   Status New   OT SHORT TERM GOAL #3   Title Pt will report incr ease with eating using adaptive strategies prn.   Time 4   Period Weeks   Status New   OT SHORT TERM GOAL #4   Title Pt will verbalize understanding of ways to prevent future complications and PD-related community resources.   Time 4   Period Weeks   Status New   OT SHORT TERM GOAL #5   Title Pt will verbalize understanding of cognitive compensation strategies/ways to promote cognitive skills prn.   Time 8   Period Weeks   Status New           OT Long Term Goals - 01/22/15 1651    OT LONG TERM GOAL #1   Title Pt will verbalize understanding of AE/strategies to increase ease/safety with ADLs/IADLs prn.   Time 8   Period Weeks   Status New   OT LONG TERM GOAL #2   Title Pt will improve coordination for ADLs as shown by improving time on 9-hole peg test by at least 10 sec with dominant LUE.   Baseline L-44.63sec   Time 8   Period Weeks    Status New   OT LONG TERM GOAL #3   Title Pt will improve functional reaching/coordination as shown by improving time on box and blocks test by at least 5 bilaterally.   Baseline 44 bilaterally 01/22/15   Time 8   Period Weeks   Status New   OT LONG TERM GOAL #4   Title Pt will report incr ease with buttons, zippers, tying, belt.   Time 4  Period Days   Status New   OT LONG TERM GOAL #5   Title Pt will improve dressing ease as shown by improving time on PPT#4 by at least 5sec.   Baseline 24.77   Time 8   Period Weeks   Status New   OT LONG TERM GOAL #6   Title Pt will improve balance/functional reaching for ADLs as shown by improving standing functional reach test to at least 11inches with each UE.   Baseline R-9", L-10"   Time 8   Period Weeks   Status New               Plan - 02/12/15 1247    Clinical Impression Statement Pt demo incr ease of movement/ADLs using big amplitude movement strategies.  However, pt needs cues/repetition to do so.  Pt did report going to the gym last weekend.   Plan continue with big amplitude movements, check STGs next visit   Consulted and Agree with Plan of Care Patient        Problem List Patient Active Problem List   Diagnosis Date Noted  . Bladder cancer ([place]) 04/26/2014  . Right inguinal hernia 09/10/2011  . Direct inguinal hernia 08/07/2011  . Urinary frequency 03/18/2011  . Skin lesion 03/18/2011  . General medical examination 03/18/2011  . Cerumen impaction 12/04/2010  . Nail fungus 10/14/2010  . Fatigue 10/14/2010  . RHINITIS 02/12/2010  . GERD 12/03/2009  . Depression 08/28/2009  . WEIGHT LOSS 08/28/2009  . PSA, INCREASED 06/11/2009  . BACK PAIN 01/09/2009  . NEOPLASM OF UNCERTAIN BEHAVIOR OF SKIN 07/05/2008  . PARKINSON'S DISEASE 05/13/2007  . Hyperlipidemia 02/28/2007  . ERECTILE DYSFUNCTION 02/28/2007  . Essential hypertension 02/28/2007  . OSTEOARTHRITIS 02/28/2007    [facility] 02/12/2015, 4:33  PM  [facility] [address] [place], [place], [address] Phone: [phone]   Fax:  [phone]  Name: Kwabena Strutz MRN: [id number] Date of Birth: Sep 15, 1933  .Vianne Bulls, OTR/L 02/12/2015 4:33 PM

## 2015-02-12 NOTE — Patient Instructions (Signed)

## 2015-02-12 NOTE — Therapy (Signed)
[place] [facility] [address] [place], [place], [address] Phone: [phone]   Fax:  [phone]  Speech Language Pathology Treatment  Patient Details  Name: [name] MRN: [id number] Date of Birth: [birth year] No Data Recorded  Encounter Date: 02/12/2015      End of Session - 02/12/15 1314    SLP Start Time 1233   SLP Stop Time  1314   SLP Time Calculation (min) 41 min      Past Medical History  Diagnosis Date  . Hyperlipidemia   . Hypertension   . Parkinson disease ([place]) DX   2005    NEUROLOGIST-   DR TAT--  IDIOPATHIC PARKINSON'S /  TREMORS CONTROLLED WITH MEDS  . GERD (gastroesophageal reflux disease)   . Allergic rhinitis   . OAB (overactive bladder)   . Frequency of urination   . Urge urinary incontinence   . Nocturia   . BPH with elevated PSA   . Osteoarthritis     KNEES, SHOULDER  . DDD (degenerative disc disease), lumbar   . Hearing loss     does not wear his hearing aid  . Mild obstructive sleep apnea     PER PT  MILD OSA , NO CPAP RX,  STUDY DONE 2009  . Depression   . Bladder cancer [facility])     urologist-  dr eskridge--  low grade TA  . Dry mouth     USES BIOTIN SPRAY/ MOUTHWASH    Past Surgical History  Procedure Laterality Date  . Nasal sinus surgery  1982  . Inguinal hernia repair  03/09/2012    Procedure: HERNIA REPAIR INGUINAL ADULT;  Surgeon: Adin Hector, MD;  Location: [facility];  Service: General;  Laterality: Right;  . Insertion of mesh  03/09/2012    Procedure: INSERTION OF MESH;  Surgeon: Adin Hector, MD;  Location: [facility];  Service: General;  Laterality: Right;  . Removal benign cyst left forehead  1969  . Orif right arm fx  1949    HARDWARE REMOVED   . Cataract extraction w/ intraocular lens  implant, bilateral  2014  . Cystoscopy with stent placement Left 12/19/2013    Procedure: CYSTOSCOPY BLADDER BX, LEFT URETERAL STENT PLACEMENT , LEFT RETROGRADE AND PYLOGRAM;   Surgeon: Festus Aloe, MD;  Location: [facility];  Service: Urology;  Laterality: Left;  . Transurethral resection of bladder tumor N/A 12/19/2013    Procedure:  TRANSURETHRAL RESECTION OF BLADDER TUMOR WITH GYRUS (TURBT-GYRUS);  Surgeon: Festus Aloe, MD;  Location: [facility];  Service: Urology;  Laterality: N/A;  . Cystoscopy w/ retrogrades Bilateral 12/14/2014    Procedure: CYSTOSCOPY BLADDER BIOPSY FULGERATION MITOMYCIN C BILATERAL RETROGRADE PYELOGRAM;  Surgeon: Festus Aloe, MD;  Location: [facility];  Service: Urology;  Laterality: Bilateral;    There were no vitals filed for this visit.  Visit Diagnosis: Hypokinetic Parkinsonian dysphonia      Subjective Assessment - 02/12/15 1238    Subjective "My wife never said she didn't hear me over the weekend"   Currently in Pain? No/denies               ADULT SLP TREATMENT - 02/12/15 1240    General Information   Behavior/Cognition Alert;Cooperative;Pleasant mood   Treatment Provided   Treatment provided Cognitive-Linquistic   Pain Assessment   Pain Assessment No/denies pain   Cognitive-Linquistic Treatment   Treatment focused on Dysarthria   Skilled Treatment Pt. audible in noisy environment over  20 minutes, pt reports his wife has been hearing him and that he is "catching himself" remembering to "be loud."  Liquids sips WFL.,   Assessment / Recommendations / Plan   Plan Continue with current plan of care          SLP Education - 03-12-2015 1306    Education provided Yes   Education Details continue loud /a/ daily upon d/c, continue loud volume practice upon d/c   Person(s) Educated Patient   Methods Explanation   Comprehension Verbalized understanding;Returned demonstration     SPEECH THERAPY DISCHARGE SUMMARY  Visits from Start of Care: 7 Current functional level related to goals / functional outcomes: See goals below   Remaining deficits: Pt to  continue loud /a/ and loud volume practice to maintain carryover of volume   Education / Equipment: Compensations for hypokinetic dysarthira Plan: Patient agrees to discharge.  Patient goals were met. Patient is being discharged due to meeting the stated rehab goals.  ?????           SLP Short Term Goals - 03/12/15 1312    SLP SHORT TERM GOAL #1   Title Pt will demonsrate loud /a/ average  of 90dB over 3 sessions with rare min A   Time 2   Period Weeks   Status On-going   SLP SHORT TERM GOAL #2   Title Pt will average 70dB for structured speech tasks with occasional min A   Status Achieved          SLP Long Term Goals - 03-12-15 1313    SLP LONG TERM GOAL #1   Title Pt will maintain average of 70dB during conversation over 10 minutes with occasional min A over two sessions   Time 6   Period Weeks   Status Achieved   SLP LONG TERM GOAL #2   Title Pt will be audible at conversation level over 12 minuts in noisy environment with occasional min A over two sessions   Time 6   Period Weeks   Status Achieved   SLP LONG TERM GOAL #3   Title Tolerate regular diet/thin liquids with general swallow precautions with rare min A per pt report over 2 sessions.    Time 6   Period Weeks   Status Achieved          Plan - 2015-03-12 1307    Clinical Impression Statement Pt with adequate loudness in noisy environment. He reports carryover and success outside of therapy. D/C skilled ST at this time. F/u for PD screen 3-6 months upon d/c fron PT/OT.   Speech Therapy Frequency 2x / week   Treatment/Interventions SLP instruction and feedback;Compensatory strategies;Patient/family education;Functional tasks;Internal/external aids   Potential Considerations Ability to learn/carryover information   Consulted and Agree with Plan of Care Patient          G-Codes - March 12, 2015 1313    Functional Assessment Tool Used noms   Functional Limitations Motor speech   Motor Speech Goal Status  [id number]) At least 1 percent but less than 20 percent impaired, limited or restricted   Motor Speech Goal Status ([id number]) At least 1 percent but less than 20 percent impaired, limited or restricted      Problem List Patient Active Problem List   Diagnosis Date Noted  . Bladder cancer ([place]) 04/26/2014  . Right inguinal hernia 09/10/2011  . Direct inguinal hernia 08/07/2011  . Urinary frequency 03/18/2011  . Skin lesion 03/18/2011  . General medical examination 03/18/2011  . Cerumen impaction  12/04/2010  . Nail fungus 10/14/2010  . Fatigue 10/14/2010  . RHINITIS 02/12/2010  . GERD 12/03/2009  . Depression 08/28/2009  . WEIGHT LOSS 08/28/2009  . PSA, INCREASED 06/11/2009  . BACK PAIN 01/09/2009  . NEOPLASM OF UNCERTAIN BEHAVIOR OF SKIN 07/05/2008  . PARKINSON'S DISEASE 05/13/2007  . Hyperlipidemia 02/28/2007  . ERECTILE DYSFUNCTION 02/28/2007  . Essential hypertension 02/28/2007  . OSTEOARTHRITIS 02/28/2007    Aayush Gelpi, Annye Rusk MS, CCC-SLP 02/12/2015, 1:15 PM  [facility] [address] [place], [place], [address] Phone: [phone]   Fax:  [phone]   Name: [name] MRN: [id number] Date of Birth: 10/17/1933

## 2015-02-14 ENCOUNTER — Encounter: Payer: Self-pay | Admitting: Speech Pathology

## 2015-02-14 ENCOUNTER — Ambulatory Visit: Payer: Medicare Other | Admitting: Occupational Therapy

## 2015-02-14 ENCOUNTER — Ambulatory Visit: Payer: Medicare Other | Admitting: Physical Therapy

## 2015-02-14 ENCOUNTER — Encounter: Payer: Self-pay | Admitting: Occupational Therapy

## 2015-02-14 DIAGNOSIS — R258 Other abnormal involuntary movements: Secondary | ICD-10-CM

## 2015-02-14 DIAGNOSIS — R293 Abnormal posture: Secondary | ICD-10-CM

## 2015-02-14 DIAGNOSIS — R279 Unspecified lack of coordination: Secondary | ICD-10-CM

## 2015-02-14 DIAGNOSIS — R278 Other lack of coordination: Secondary | ICD-10-CM

## 2015-02-14 DIAGNOSIS — R269 Unspecified abnormalities of gait and mobility: Secondary | ICD-10-CM | POA: Diagnosis not present

## 2015-02-14 DIAGNOSIS — R4189 Other symptoms and signs involving cognitive functions and awareness: Secondary | ICD-10-CM

## 2015-02-14 DIAGNOSIS — R2681 Unsteadiness on feet: Secondary | ICD-10-CM

## 2015-02-14 NOTE — Patient Instructions (Signed)
Coordination Exercises  Perform the following exercises for 20 minutes 1 times per day. Perform with both hand(s). Perform using big movements.   Flipping Cards: Place deck of cards on the table. Flip cards over by opening your hand big to grasp and then turn your palm up big.  Deal cards: Hold 1/2 or whole deck in your hand. Use thumb to push card off top of deck with one big push.  Pick up coins and place in coin bank or container: Pick up with big, intentional movements. Do not drag coin to the edge.  Perform hand stretches (PWR! Hands): Close hands then flick out your fingers with focus on opening hands, pulling wrists back, and extending elbows like you are pushing someone away.

## 2015-02-14 NOTE — Therapy (Signed)
[place] [address] [place], [place], [address] Phone: [phone]   Fax:  [phone]  Occupational Therapy Treatment  Patient Details  Name: [name] MRN: [id number] Date of Birth: 03-Feb-1934 No Data Recorded  Encounter Date: 02/14/2015      OT End of Session - 02/14/15 1505    Visit Number 8   Number of Visits 17   Date for OT Re-Evaluation 03/15/15   Authorization Type [employer], no visit limit, no auth required, g-code   Authorization - Visit Number 8   Authorization - Number of Visits 10   OT Start Time 1408   OT Stop Time 1455   OT Time Calculation (min) 47 min   Activity Tolerance Patient tolerated treatment well   Behavior During Therapy [facility] for tasks assessed/performed      Past Medical History  Diagnosis Date  . Hyperlipidemia   . Hypertension   . Parkinson disease ([place]) DX   2005    NEUROLOGIST-   DR TAT--  IDIOPATHIC PARKINSON'S /  TREMORS CONTROLLED WITH MEDS  . GERD (gastroesophageal reflux disease)   . Allergic rhinitis   . OAB (overactive bladder)   . Frequency of urination   . Urge urinary incontinence   . Nocturia   . BPH with elevated PSA   . Osteoarthritis     KNEES, SHOULDER  . DDD (degenerative disc disease), lumbar   . Hearing loss     does not wear his hearing aid  . Mild obstructive sleep apnea     PER PT  MILD OSA , NO CPAP RX,  STUDY DONE 2009  . Depression   . Bladder cancer [facility])     urologist-  dr eskridge--  low grade TA  . Dry mouth     USES BIOTIN SPRAY/ MOUTHWASH    Past Surgical History  Procedure Laterality Date  . Nasal sinus surgery  1982  . Inguinal hernia repair  03/09/2012    Procedure: HERNIA REPAIR INGUINAL ADULT;  Surgeon: Adin Hector, MD;  [facility]: [facility];  Service: General;  Laterality: Right;  . Insertion of mesh  03/09/2012    Procedure: INSERTION OF MESH;  Surgeon: Adin Hector, MD;  Location: [facility];  Service: General;   Laterality: Right;  . Removal benign cyst left forehead  1969  . Orif right arm fx  1949    HARDWARE REMOVED   . Cataract extraction w/ intraocular lens  implant, bilateral  2014  . Cystoscopy with stent placement Left 12/19/2013    Procedure: CYSTOSCOPY BLADDER BX, LEFT URETERAL STENT PLACEMENT , LEFT RETROGRADE AND PYLOGRAM;  Surgeon: Festus Aloe, MD;  Location: [facility];  Service: Urology;  Laterality: Left;  . Transurethral resection of bladder tumor N/A 12/19/2013    Procedure:  TRANSURETHRAL RESECTION OF BLADDER TUMOR WITH GYRUS (TURBT-GYRUS);  Surgeon: Festus Aloe, MD;  Location: [facility];  Service: Urology;  Laterality: N/A;  . Cystoscopy w/ retrogrades Bilateral 12/14/2014    Procedure: CYSTOSCOPY BLADDER BIOPSY FULGERATION MITOMYCIN C BILATERAL RETROGRADE PYELOGRAM;  Surgeon: Festus Aloe, MD;  Location: [facility];  Service: Urology;  Laterality: Bilateral;    There were no vitals filed for this visit.  Visit Diagnosis:  Bradykinesia  Posture abnormality  Decreased coordination  Unsteadiness  Cognitive deficits      Subjective Assessment - 02/14/15 1412    Subjective  "I was able to get 2 jars open the way you showed me.  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  "I can see a difference"   Pertinent History Parkinson's disease    Patient Stated Goals unsure   Currently in Pain? No/denies                      OT Treatments/Exercises (OP) - 02/14/15 0001    ADLs   Overall ADLs Reviewed use of large amplitude movment strategies for ADLs and sucess with using at home.  Reinforced importance of large amplitude movements to prevent future complications and incr ease with ADL performance.  Emphasized importance of exercise with PD and reviewed rationale of each exercise in relation to functional movements.  Began checking STGs (see goal section)--will finish next session.   Pt verbalized understanding of education provided.    ADL Comments Pt instructed in how to use PWR! hands in prep for/during ADLs to incr ease for buttons, writing, picking up small objects, etc.  Pt verbalized understanding.                OT Education - 02/14/15 1506    Education Details PWR! hands, coordination HEP--see pt instructions   Person(s) Educated Patient   Methods Explanation;Demonstration;Verbal cues;Tactile cues;Handout   Comprehension Verbalized understanding;Returned demonstration;Verbal cues required          OT Short Term Goals - 02/14/15 1501    OT SHORT TERM GOAL #1   Title Pt will be independent with initial PD-specific HEP.--check STGs 02/13/15   Time 4   Period Weeks   Status On-going  02/14/15:  min cues   OT SHORT TERM GOAL #2   Title Pt will improve coordination for ADLs as shown by improving time on 9-hole peg test by at least 5 sec with dominant LUE.   Baseline 44.63sec   Time 4   Period Weeks   Status New   OT SHORT TERM GOAL #3   Title Pt will report incr ease with eating using adaptive strategies prn.   Time 4   Period Weeks   Status New   OT SHORT TERM GOAL #4   Title Pt will verbalize understanding of ways to prevent future complications and PD-related community resources.   Time 4   Period Weeks   Status Achieved  02/14/15   OT SHORT TERM GOAL #5   Title Pt will verbalize understanding of cognitive compensation strategies/ways to promote cognitive skills prn.   Time 8   Period Weeks   Status New           OT Long Term Goals - 02/14/15 1500    OT LONG TERM GOAL #1   Title Pt will verbalize understanding of AE/strategies to increase ease/safety with ADLs/IADLs prn.   Time 8   Period Weeks   Status Achieved  02/14/15 Verbalizes understanding, but needs cues/reinforcement for consistency   OT LONG TERM GOAL #2   Title Pt will improve coordination for ADLs as shown by improving time on 9-hole peg test by at least 10 sec  with dominant LUE.   Baseline L-44.63sec   Time 8   Period Weeks   Status New   OT LONG TERM GOAL #3   Title Pt will improve functional reaching/coordination as shown by improving time on box and blocks test by at least 5 bilaterally.   Baseline 44 bilaterally 01/22/15   Time 8   Period Weeks   Status New   OT LONG TERM GOAL #4   Title  Pt will report incr ease with buttons, zippers, tying, belt.   Time 4   Period Days   Status New   OT LONG TERM GOAL #5   Title Pt will improve dressing ease as shown by improving time on PPT#4 by at least 5sec.   Baseline 24.77   Time 8   Period Weeks   Status New   OT LONG TERM GOAL #6   Title Pt will improve balance/functional reaching for ADLs as shown by improving standing functional reach test to at least 11inches with each UE.   Baseline R-9", L-10"   Time 8   Period Weeks   Status New               Plan - 02/14/15 1502    Clinical Impression Statement Pt demo incr ease of movements/ADLs using big amplitude movement strategies and reports using them some at home.  However, pt continues to need cueing to do so consistently.   Plan continue with big amplitude movements, check remaining STGs next visit and discuss progress (?schedule more appts vs d/c wk of 11/7).   OT Home Exercise Plan Education provided:  01/21/15 Karn Cassis!; 01/22/15 Supine PWR!; 02/05/15 ways to prevent future complication, POP community group; big movements with ADLs/strategies for ADLs, 02/14/15 PWR! hands, coordination HEP   Consulted and Agree with Plan of Care Patient        Problem List Patient Active Problem List   Diagnosis Date Noted  . Bladder cancer ([place]) 04/26/2014  . Right inguinal hernia 09/10/2011  . Direct inguinal hernia 08/07/2011  . Urinary frequency 03/18/2011  . Skin lesion 03/18/2011  . General medical examination 03/18/2011  . Cerumen impaction 12/04/2010  . Nail fungus 10/14/2010  . Fatigue 10/14/2010  . RHINITIS 02/12/2010   . GERD 12/03/2009  . Depression 08/28/2009  . WEIGHT LOSS 08/28/2009  . PSA, INCREASED 06/11/2009  . BACK PAIN 01/09/2009  . NEOPLASM OF UNCERTAIN BEHAVIOR OF SKIN 07/05/2008  . PARKINSON'S DISEASE 05/13/2007  . Hyperlipidemia 02/28/2007  . ERECTILE DYSFUNCTION 02/28/2007  . Essential hypertension 02/28/2007  . OSTEOARTHRITIS 02/28/2007    [facility] 02/14/2015, 3:14 PM  [place] [address] [place], [place], [address] Phone: [phone]   Fax:  [phone]  Name: [name] MRN: [id number] Date of Birth: 16-Jun-1933   Vianne Bulls, OTR/L 02/14/2015 3:14 PM

## 2015-02-15 NOTE — Therapy (Signed)
[facility] [address] [place], [place], [address] Phone: [phone]   Fax:  [phone]  Physical Therapy Treatment  Patient Details  Name: [name] MRN: [id number] Date of Birth: Jul 08, 1933 No Data Recorded  Encounter Date: 02/14/2015      PT End of Session - 02/15/15 1042    Visit Number 6   Number of Visits 17   Date for PT Re-Evaluation 03/17/15   Authorization Type Blue Medicare-G-code every 10th visit   PT Start Time 1316   PT Stop Time 1400   PT Time Calculation (min) 44 min   Activity Tolerance Patient tolerated treatment well   Behavior During Therapy [facility] for tasks assessed/performed      Past Medical History  Diagnosis Date  . Hyperlipidemia   . Hypertension   . Parkinson disease ([place]) DX   2005    NEUROLOGIST-   DR TAT--  IDIOPATHIC PARKINSON'S /  TREMORS CONTROLLED WITH MEDS  . GERD (gastroesophageal reflux disease)   . Allergic rhinitis   . OAB (overactive bladder)   . Frequency of urination   . Urge urinary incontinence   . Nocturia   . BPH with elevated PSA   . Osteoarthritis     KNEES, SHOULDER  . DDD (degenerative disc disease), lumbar   . Hearing loss     does not wear his hearing aid  . Mild obstructive sleep apnea     PER PT  MILD OSA , NO CPAP RX,  STUDY DONE 2009  . Depression   . Bladder cancer [facility])     urologist-  dr eskridge--  low grade TA  . Dry mouth     USES BIOTIN SPRAY/ MOUTHWASH    Past Surgical History  Procedure Laterality Date  . Nasal sinus surgery  1982  . Inguinal hernia repair  03/09/2012    Procedure: HERNIA REPAIR INGUINAL ADULT;  Surgeon: Adin Hector, MD;  Location: [facility];  Service: General;  Laterality: Right;  . Insertion of mesh  03/09/2012    Procedure: INSERTION OF MESH;  Surgeon: Adin Hector, MD;  Location: [facility];  Service: General;  Laterality: Right;  . Removal benign cyst left forehead  1969  . Orif right arm fx  1949   HARDWARE REMOVED   . Cataract extraction w/ intraocular lens  implant, bilateral  2014  . Cystoscopy with stent placement Left 12/19/2013    Procedure: CYSTOSCOPY BLADDER BX, LEFT URETERAL STENT PLACEMENT , LEFT RETROGRADE AND PYLOGRAM;  Surgeon: Festus Aloe, MD;  Location: [facility];  Service: Urology;  Laterality: Left;  . Transurethral resection of bladder tumor N/A 12/19/2013    Procedure:  TRANSURETHRAL RESECTION OF BLADDER TUMOR WITH GYRUS (TURBT-GYRUS);  Surgeon: Festus Aloe, MD;  Location: [facility];  Service: Urology;  Laterality: N/A;  . Cystoscopy w/ retrogrades Bilateral 12/14/2014    Procedure: CYSTOSCOPY BLADDER BIOPSY FULGERATION MITOMYCIN C BILATERAL RETROGRADE PYELOGRAM;  Surgeon: Festus Aloe, MD;  Location: [facility];  Service: Urology;  Laterality: Bilateral;    There were no vitals filed for this visit.  Visit Diagnosis:  Bradykinesia  Abnormality of gait      Subjective Assessment - 02/14/15 1321    Subjective Feeling a little slow today-like I'm just behind everything today.   Currently in Pain? No/denies                         [facility] Adult  PT Treatment/Exercise - 02/14/15 1340    Transfers   Transfers Sit to Stand;Stand to Sit   Sit to Stand 5: Supervision   Five time sit to stand comments  25.06  improved foot placement from eval   Stand to Sit 5: Supervision   Comments Pt continues to perform 5x sit<>stand at a slowed pace   Ambulation/Gait   Ambulation/Gait Yes   Ambulation/Gait Assistance 6: Modified independent (Device/Increase time)   Ambulation Distance (Feet) 200 Feet  indoors, then 800 ft outdoors   Assistive device None   Gait Pattern Step-through pattern;Poor foot clearance - left;Poor foot clearance - right  veering to the left several times during conversation tasks   Ambulation Surface Level;Indoor;Unlevel;Outdoor   Gait velocity 11.30 sec = 2.9 ft/sec   Gait  Comments Gait with targeted conversational tasks for dual tasking activities with gait.  During focused conversation, pt veers to L 3 times with pt recovering balance independently.   Posture/Postural Control   Postural Limitations Rounded Shoulders;Forward head;Weight shift left   Standardized Balance Assessment   Standardized Balance Assessment Timed Up and Go Test   Timed Up and Go Test   TUG Normal TUG   Normal TUG (seconds) 13.69  1st trial, 12.69 2nd trial, 12.91 sec 3rd trial:  avg- 13.01           PWR ([facility]) - 02/15/15 1040    PWR! exercises Moves in standing   PWR! Up 10   PWR! Rock 10   PWR! Twist 10  Modified to perform in corner, reaching to targets on wall.   PWR Step 10   Comments Pt requires initial verbal cues for technique and reinforcement; performs better than previous performance of standing PWR! Moves.   Pt continues to take extra time for exercises               PT Short Term Goals - 02/14/15 1323    PT SHORT TERM GOAL #1   Title Pt will perform HEP for improved functional mobility, gait, balance and transfers.  TARGET 02/14/15   Time 4   Period Weeks   Status Achieved   PT SHORT TERM GOAL #2   Title Pt will improve 5x sit<>stand to less than or equal to 18 seconds for improved transfer efficiency and safety.   Time 4   Period Weeks   Status Not Met   PT SHORT TERM GOAL #3   Title Pt will improve TUG score to less than or equal to 13.5 seconds for decreased fall risk.   Time 4   Period Weeks   Status Achieved   PT SHORT TERM GOAL #4   Title Pt will improve gait velocity to at least 2.8 ft/sec for improved gait efficiency and safety.   Time 4   Period Weeks   Status Achieved           PT Long Term Goals - 01/16/15 1125    PT LONG TERM GOAL #1   Title Pt will verbalize understanding of fall prevention in the home environment.   TARGET 03/17/15   Time 8   Period Weeks   Status New   PT LONG TERM GOAL #2   Title Pt will improve 5x  sit<>stand to less than or equal to 15 seconds for improved efficiency and safety with transfers.   Time 8   Period Weeks   Status New   PT LONG TERM GOAL #3   Title Pt will improve TUG cognitive score to  less than or equal to 15 seconds for decreased fall risk and improved ability for dual tasking.   Time 8   Period Weeks   Status New   PT LONG TERM GOAL #4   Title Pt will improve Functional Gait Assessment score to at least 20/30 for improved functional mobility and decreased fall risk.   Time 8   Period Weeks   Status New   PT LONG TERM GOAL #5   Title Pt will verbalize plans for continue community fitness upon D/C from PT.   Time 8   Period Weeks   Status New               Plan - 02/15/15 1044    Clinical Impression Statement Pt appears to have better understanding of PWR! Moves in standing this session compared to previous; still unsure of pt's carryover of exercise performance at home.  Pt has met STG #1, 3, and 4 with improved TUG score and improved gait velocity.  STG #2 not met for 5x sit <>stand, with increased time compared to eval; however, pt has improved foot placement with transfers.Pt will continue to benefit from further skilled PT to address gait, balance, transfers, posture for improved functional mobility and decreased fall risk.   Pt will benefit from skilled therapeutic intervention in order to improve on the following deficits Abnormal gait;Decreased balance;Decreased mobility;Decreased strength;Difficulty walking;Postural dysfunction   Rehab Potential Good   PT Frequency 2x / week   PT Duration 8 weeks   PT Treatment/Interventions ADLs/Self Care Home Management;Therapeutic exercise;Therapeutic activities;Functional mobility training;Gait training;Balance training;Neuromuscular re-education;Patient/family education   PT Next Visit Plan balance strategy activities/compliant surfaces, transfer training varied surfaces, Functional movement patterns   Consulted  and Agree with Plan of Care Patient        Problem List Patient Active Problem List   Diagnosis Date Noted  . Bladder cancer ([place]) 04/26/2014  . Right inguinal hernia 09/10/2011  . Direct inguinal hernia 08/07/2011  . Urinary frequency 03/18/2011  . Skin lesion 03/18/2011  . General medical examination 03/18/2011  . Cerumen impaction 12/04/2010  . Nail fungus 10/14/2010  . Fatigue 10/14/2010  . RHINITIS 02/12/2010  . GERD 12/03/2009  . Depression 08/28/2009  . WEIGHT LOSS 08/28/2009  . PSA, INCREASED 06/11/2009  . BACK PAIN 01/09/2009  . NEOPLASM OF UNCERTAIN BEHAVIOR OF SKIN 07/05/2008  . PARKINSON'S DISEASE 05/13/2007  . Hyperlipidemia 02/28/2007  . ERECTILE DYSFUNCTION 02/28/2007  . Essential hypertension 02/28/2007  . OSTEOARTHRITIS 02/28/2007    Kodie Kishi W. 02/15/2015, 10:48 AM Frazier Butt., PT [facility] [address] [place], [place], [address] Phone: [phone]   Fax:  [phone]  Name: [name] MRN: [id number] Date of Birth: [birth year]

## 2015-02-19 ENCOUNTER — Ambulatory Visit: Payer: Medicare Other | Admitting: Physical Therapy

## 2015-02-19 ENCOUNTER — Ambulatory Visit: Payer: Medicare Other | Attending: Neurology | Admitting: Occupational Therapy

## 2015-02-19 ENCOUNTER — Encounter: Payer: Self-pay | Admitting: Speech Pathology

## 2015-02-19 DIAGNOSIS — R258 Other abnormal involuntary movements: Secondary | ICD-10-CM

## 2015-02-19 DIAGNOSIS — R2681 Unsteadiness on feet: Secondary | ICD-10-CM | POA: Diagnosis present

## 2015-02-19 DIAGNOSIS — R279 Unspecified lack of coordination: Secondary | ICD-10-CM | POA: Diagnosis present

## 2015-02-19 DIAGNOSIS — R293 Abnormal posture: Secondary | ICD-10-CM | POA: Diagnosis present

## 2015-02-19 DIAGNOSIS — R269 Unspecified abnormalities of gait and mobility: Secondary | ICD-10-CM | POA: Insufficient documentation

## 2015-02-19 DIAGNOSIS — R4189 Other symptoms and signs involving cognitive functions and awareness: Secondary | ICD-10-CM | POA: Insufficient documentation

## 2015-02-19 DIAGNOSIS — R278 Other lack of coordination: Secondary | ICD-10-CM

## 2015-02-19 NOTE — Therapy (Signed)
[facility] [address] [place], [place], [address] Phone: [phone]   Fax:  [phone]  Occupational Therapy Treatment  Patient Details  Name: [name] MRN: [id number] Date of Birth: 03-Mar-1934 No Data Recorded  Encounter Date: 02/19/2015      OT End of Session - 02/19/15 1551    Visit Number 9   Number of Visits 17   Date for OT Re-Evaluation 03/15/15   Authorization Type Blue Medicare, no visit limit, no auth required, g-code   Authorization - Visit Number 9   Authorization - Number of Visits 10   OT Start Time 5188   OT Stop Time 1537   OT Time Calculation (min) 50 min   Activity Tolerance Patient tolerated treatment well   Behavior During Therapy [facility] for tasks assessed/performed      Past Medical History  Diagnosis Date  . Hyperlipidemia   . Hypertension   . Parkinson disease ([place]) DX   2005    NEUROLOGIST-   DR TAT--  IDIOPATHIC PARKINSON'S /  TREMORS CONTROLLED WITH MEDS  . GERD (gastroesophageal reflux disease)   . Allergic rhinitis   . OAB (overactive bladder)   . Frequency of urination   . Urge urinary incontinence   . Nocturia   . BPH with elevated PSA   . Osteoarthritis     KNEES, SHOULDER  . DDD (degenerative disc disease), lumbar   . Hearing loss     does not wear his hearing aid  . Mild obstructive sleep apnea     PER PT  MILD OSA , NO CPAP RX,  STUDY DONE 2009  . Depression   . Bladder cancer [facility])     urologist-  dr eskridge--  low grade TA  . Dry mouth     USES BIOTIN SPRAY/ MOUTHWASH    Past Surgical History  Procedure Laterality Date  . Nasal sinus surgery  1982  . Inguinal hernia repair  03/09/2012    Procedure: HERNIA REPAIR INGUINAL ADULT;  Surgeon: Adin Hector, MD;  [facility];  Service: General;  Laterality: Right;  . Insertion of mesh  03/09/2012    Procedure: INSERTION OF MESH;  Surgeon: Adin Hector, MD;  [facility];  Service: General;   Laterality: Right;  . Removal benign cyst left forehead  1969  . Orif right arm fx  1949    HARDWARE REMOVED   . Cataract extraction w/ intraocular lens  implant, bilateral  2014  . Cystoscopy with stent placement Left 12/19/2013    Procedure: CYSTOSCOPY BLADDER BX, LEFT URETERAL STENT PLACEMENT , LEFT RETROGRADE AND PYLOGRAM;  Surgeon: Festus Aloe, MD;  Location: [facility];  Service: Urology;  Laterality: Left;  . Transurethral resection of bladder tumor N/A 12/19/2013    Procedure:  TRANSURETHRAL RESECTION OF BLADDER TUMOR WITH GYRUS (TURBT-GYRUS);  Surgeon: Festus Aloe, MD;  Location: [facility];  Service: Urology;  Laterality: N/A;  . Cystoscopy w/ retrogrades Bilateral 12/14/2014    Procedure: CYSTOSCOPY BLADDER BIOPSY FULGERATION MITOMYCIN C BILATERAL RETROGRADE PYELOGRAM;  Surgeon: Festus Aloe, MD;  Location: [facility];  Service: Urology;  Laterality: Bilateral;    There were no vitals filed for this visit.  Visit Diagnosis:  Cognitive deficits  Bradykinesia  Decreased coordination  Unsteadiness      Subjective Assessment - 02/19/15 1453    Subjective  Pt reports that he hasn't went to the [facility] since last Friday and he has not  been doing his exercises.  "I don't want this to take over my life"  but "I think that this (therapy) has helped me to be more aware of what I need to work on"   Pertinent History Parkinson's disease    Patient Stated Goals unsure   Currently in Pain? No/denies                      OT Treatments/Exercises (OP) - 02/19/15 1556    ADLs   Overall ADLs checked STGs and discussed progress--see goals section   Eating reviewed eating strategies.  Pt verbalized understanding (use of big movements)   Home Maintenance Recommended taking breaks to stretch/exercise/PWR! up instead of prolong periods of repetitive activities and that it may make activities take less time and incr ease    Medication Management Recommended pt discuss med schedule with pharmacist due to pt questions and difficulty figuring out schedule.  Also recommended use of alarm for medications and emphasized importance of taking medication on time every time.  Pt verbalized understanding.                OT Education - 02/19/15 1554    Education Details Importance of exercise, Keeping Thinking Skills Sharp/Memory Compensation strategies, Using daily schedule to incorporate exercises into daily routine   Person(s) Educated Patient   Methods Explanation;Handout;Verbal cues   Comprehension Verbalized understanding          OT Short Term Goals - 02/19/15 1458    OT SHORT TERM GOAL #1   Title Pt will be independent with initial PD-specific HEP.--check STGs 02/13/15   Time 4   Period Weeks   Status On-going  02/14/15:  min cues   OT SHORT TERM GOAL #2   Title Pt will improve coordination for ADLs as shown by improving time on 9-hole peg test by at least 5 sec with dominant LUE.   Baseline 44.63sec   Time 4   Period Weeks   Status Achieved  02/19/15  36.22sec, 26.81sec   OT SHORT TERM GOAL #3   Title Pt will report incr ease with eating using adaptive strategies prn.   Time 4   Period Weeks   Status On-going  partially met 02/19/15   OT SHORT TERM GOAL #4   Title Pt will verbalize understanding of ways to prevent future complications and PD-related community resources.   Time 4   Period Weeks   Status Achieved  02/14/15   OT SHORT TERM GOAL #5   Title Pt will verbalize understanding of cognitive compensation strategies/ways to promote cognitive skills prn.   Time 8   Period Weeks   Status Achieved  02/19/15           OT Long Term Goals - 02/19/15 1608    OT LONG TERM GOAL #1   Title Pt will verbalize understanding of AE/strategies to increase ease/safety with ADLs/IADLs prn.   Time 8   Period Weeks   Status Achieved  02/14/15 Verbalizes understanding, but needs  cues/reinforcement for consistency   OT LONG TERM GOAL #2   Title Pt will improve coordination for ADLs as shown by improving time on 9-hole peg test by at least 10 sec with dominant LUE.   Baseline L-44.63sec   Time 8   Period Weeks   Status Achieved  02/19/15:  36.22, 26.81sec   OT LONG TERM GOAL #3   Title Pt will improve functional reaching/coordination as shown by improving time on box and blocks  test by at least 5 bilaterally.   Baseline 44 bilaterally 01/22/15   Time 8   Period Weeks   Status New   OT LONG TERM GOAL #4   Title Pt will report incr ease with buttons, zippers, tying, belt.   Time 4   Period Days   Status New   OT LONG TERM GOAL #5   Title Pt will improve dressing ease as shown by improving time on PPT#4 by at least 5sec.   Baseline 24.77   Time 8   Period Weeks   Status New   OT LONG TERM GOAL #6   Title Pt will improve balance/functional reaching for ADLs as shown by improving standing functional reach test to at least 11inches with each UE.   Baseline R-9", L-10"   Time 8   Period Weeks   Status New               Plan - 02/19/15 1605    Clinical Impression Statement Pt demo improved awareness of deficits and performing some functional strategies, but not performing HEP consistently at home.     Plan G-code next session, continue with big amplitude movements (buttons, writing), anticipate d/c next week   OT Home Exercise Plan Education provided:  01/21/15 Karn Cassis!; 01/22/15 Supine PWR!; 02/05/15 ways to prevent future complication, POP community group; big movements with ADLs/strategies for ADLs, 02/14/15 PWR! hands, coordination HEP   Consulted and Agree with Plan of Care Patient        Problem List Patient Active Problem List   Diagnosis Date Noted  . Bladder cancer ([place]) 04/26/2014  . Right inguinal hernia 09/10/2011  . Direct inguinal hernia 08/07/2011  . Urinary frequency 03/18/2011  . Skin lesion 03/18/2011  . General medical  examination 03/18/2011  . Cerumen impaction 12/04/2010  . Nail fungus 10/14/2010  . Fatigue 10/14/2010  . RHINITIS 02/12/2010  . GERD 12/03/2009  . Depression 08/28/2009  . WEIGHT LOSS 08/28/2009  . PSA, INCREASED 06/11/2009  . BACK PAIN 01/09/2009  . NEOPLASM OF UNCERTAIN BEHAVIOR OF SKIN 07/05/2008  . PARKINSON'S DISEASE 05/13/2007  . Hyperlipidemia 02/28/2007  . ERECTILE DYSFUNCTION 02/28/2007  . Essential hypertension 02/28/2007  . OSTEOARTHRITIS 02/28/2007    [facility] 02/19/2015, 9:18 PM  [facility] [address] [place], [place], [address] Phone: [phone]   Fax:  [phone]  Name: [name] MRN: [id number] Date of Birth: May 22, 1933  Vianne Bulls, OTR/L 02/19/2015 9:18 PM

## 2015-02-19 NOTE — Patient Instructions (Addendum)
Keeping Thinking Skills Sharp: 1. Jigsaw puzzles 2. Card/board games 3. Talking on the phone/social events 4. Lum[employer].com 5. Online games 6. Word serches/crossword puzzles 7.  Logic puzzles 8. Aerobic exercise (stationary bike) 9. Eating balanced diet (fruits & veggies) 10. Drink water 11. Try something new--new recipe, hobby 12. Crafts 13. Do a variety of activities that are challenging 14. Add cognitive activities to walking/exercising (think of animal/food/city with each letter of the alphabet, counting backwards, thinking of as many vegetables as you can, etc.).--Only do this  If safe (no freezing/falls). Memory Compensation Strategies  1. Use "WARM" strategy.  W= write it down  A= associate it  R= repeat it  M= make a mental note  2.   You can keep a Memory Notebook.  Use a 3-ring notebook with sections for the following: calendar, important names and phone numbers,  medications, doctors' names/phone numbers, lists/reminders, and a section to journal what you did  each day.   3.    Use a calendar to write appointments down.  4.    Write yourself a schedule for the day.  This can be placed on the calendar or in a separate section of the Memory Notebook.  Keeping a  regular schedule can help memory.  5.    Use medication organizer with sections for each day or morning/evening pills.  You may need help loading it  6.    Keep a basket, or pegboard by the door.  Place items that you need to take out with you in the basket or on the pegboard.  You may also want to  include a message board for reminders.  7.    Use sticky notes.  Place sticky notes with reminders in a place where the task is performed.  For example: " turn off the  stove" placed by the stove, "lock the door" placed on the door at eye level, " take your medications" on  the bathroom mirror or by the place where you normally take your medications.  8.    Use alarms/timers.  Use while cooking to remind yourself  to check on food or as a reminder to take your medicine, or as a  reminder to make a call, or as a reminder to perform another task, etc.  

## 2015-02-19 NOTE — Therapy (Signed)
[facility] [address] [place], [place], [address] Phone: [phone]   Fax:  [phone]  Physical Therapy Treatment  Patient Details  Name: [name] MRN: [id number] Date of Birth: Oct 12, 1933 No Data Recorded  Encounter Date: 02/19/2015      PT End of Session - 02/19/15 1408    Visit Number 7   Number of Visits 17   Date for PT Re-Evaluation 03/17/15   Authorization Type Blue Medicare-G-code every 10th visit   PT Start Time 1318   PT Stop Time 1400   PT Time Calculation (min) 42 min   Equipment Utilized During Treatment Gait belt   Activity Tolerance Patient tolerated treatment well   Behavior During Therapy [facility] for tasks assessed/performed      Past Medical History  Diagnosis Date  . Hyperlipidemia   . Hypertension   . Parkinson disease ([place]) DX   2005    NEUROLOGIST-   DR TAT--  IDIOPATHIC PARKINSON'S /  TREMORS CONTROLLED WITH MEDS  . GERD (gastroesophageal reflux disease)   . Allergic rhinitis   . OAB (overactive bladder)   . Frequency of urination   . Urge urinary incontinence   . Nocturia   . BPH with elevated PSA   . Osteoarthritis     KNEES, SHOULDER  . DDD (degenerative disc disease), lumbar   . Hearing loss     does not wear his hearing aid  . Mild obstructive sleep apnea     PER PT  MILD OSA , NO CPAP RX,  STUDY DONE 2009  . Depression   . Bladder cancer [facility])     urologist-  dr eskridge--  low grade TA  . Dry mouth     USES BIOTIN SPRAY/ MOUTHWASH    Past Surgical History  Procedure Laterality Date  . Nasal sinus surgery  1982  . Inguinal hernia repair  03/09/2012    Procedure: HERNIA REPAIR INGUINAL ADULT;  Surgeon: Adin Hector, MD;  Location: [facility];  Service: General;  Laterality: Right;  . Insertion of mesh  03/09/2012    Procedure: INSERTION OF MESH;  Surgeon: Adin Hector, MD;  Location: [facility];  Service: General;  Laterality: Right;  . Removal benign cyst left  forehead  1969  . Orif right arm fx  1949    HARDWARE REMOVED   . Cataract extraction w/ intraocular lens  implant, bilateral  2014  . Cystoscopy with stent placement Left 12/19/2013    Procedure: CYSTOSCOPY BLADDER BX, LEFT URETERAL STENT PLACEMENT , LEFT RETROGRADE AND PYLOGRAM;  Surgeon: Festus Aloe, MD;  Location: [facility];  Service: Urology;  Laterality: Left;  . Transurethral resection of bladder tumor N/A 12/19/2013    Procedure:  TRANSURETHRAL RESECTION OF BLADDER TUMOR WITH GYRUS (TURBT-GYRUS);  Surgeon: Festus Aloe, MD;  Location[facility];  Service: Urology;  Laterality: N/A;  . Cystoscopy w/ retrogrades Bilateral 12/14/2014    Procedure: CYSTOSCOPY BLADDER BIOPSY FULGERATION MITOMYCIN C BILATERAL RETROGRADE PYELOGRAM;  Surgeon: Festus Aloe, MD;  Location: [facility];  Service: Urology;  Laterality: Bilateral;    There were no vitals filed for this visit.  Visit Diagnosis:  Abnormality of gait  Bradykinesia  Posture abnormality      Subjective Assessment - 02/19/15 1322    Subjective Feeling a little tired today-no changes, no falls since last visit.   Currently in Pain? No/denies  [facility] Adult PT Treatment/Exercise - 02/19/15 1322    Posture/Postural Control   Postural Limitations Rounded Shoulders;Forward head   High Level Balance   High Level Balance Activities Side stepping;Backward walking;Marching forwards;Marching backwards  & forward walk on compliant surface x 3 reps each   High Level Balance Comments With min guard assistance; pt has one posterior loss of balance with sidestepping on compliant surface; on foam balance beam:  marching in place, forward kicks, forward step taps, back step taps, head turns/nods with eyes open, then eyes closed x 10 seconds (pt has 1-2 posterior loss of balance).  Balance work on incline/decline with marching in place, then head  turns/nods x 5 reps each, then eyes closed x 10 seconds with min guard assistance.  Ascend/descend ramp x 3 reps with cues for safe ramp negotiation.    Knee/Hip Exercises: Aerobic   Stationary Bike Scifit level 2 all 4 extremities x 8 minutes working on maintaining rpm>60 with 30 second bouts of >75 so to reinforce recommendation to work out at perceived exertion 7-8/10 of ability                PT Education - 02/19/15 1407    Education provided Yes   Education Details Discussed losses of balance in posterior direction today as something new-pt reports normally having increased forward flexed posture.  Discussed widening BOS   Person(s) Educated Patient   Methods Explanation;Demonstration;Verbal cues   Comprehension Verbalized understanding;Returned demonstration;Need further instruction          PT Short Term Goals - 02/14/15 1323    PT SHORT TERM GOAL #1   Title Pt will perform HEP for improved functional mobility, gait, balance and transfers.  TARGET 02/14/15   Time 4   Period Weeks   Status Achieved   PT SHORT TERM GOAL #2   Title Pt will improve 5x sit<>stand to less than or equal to 18 seconds for improved transfer efficiency and safety.   Time 4   Period Weeks   Status Not Met   PT SHORT TERM GOAL #3   Title Pt will improve TUG score to less than or equal to 13.5 seconds for decreased fall risk.   Time 4   Period Weeks   Status Achieved   PT SHORT TERM GOAL #4   Title Pt will improve gait velocity to at least 2.8 ft/sec for improved gait efficiency and safety.   Time 4   Period Weeks   Status Achieved           PT Long Term Goals - 01/16/15 1125    PT LONG TERM GOAL #1   Title Pt will verbalize understanding of fall prevention in the home environment.   TARGET 03/17/15   Time 8   Period Weeks   Status New   PT LONG TERM GOAL #2   Title Pt will improve 5x sit<>stand to less than or equal to 15 seconds for improved efficiency and safety with transfers.    Time 8   Period Weeks   Status New   PT LONG TERM GOAL #3   Title Pt will improve TUG cognitive score to less than or equal to 15 seconds for decreased fall risk and improved ability for dual tasking.   Time 8   Period Weeks   Status New   PT LONG TERM GOAL #4   Title Pt will improve Functional Gait Assessment score to at least 20/30 for improved functional mobility and decreased fall risk.  Time 8   Period Weeks   Status New   PT LONG TERM GOAL #5   Title Pt will verbalize plans for continue community fitness upon D/C from PT.   Time 8   Period Weeks   Status New               Plan - 02/19/15 1410    Clinical Impression Statement Focused session today on balance activities on compliant surfaces.  Pt does have several episodes of loss of balance during activities today.  Pt will continue to benefit from further skilled PT to address balance, gait and transfers.   Pt will benefit from skilled therapeutic intervention in order to improve on the following deficits Abnormal gait;Decreased balance;Decreased mobility;Decreased strength;Difficulty walking;Postural dysfunction   Rehab Potential Good   PT Frequency 2x / week   PT Duration 8 weeks   PT Treatment/Interventions ADLs/Self Care Home Management;Therapeutic exercise;Therapeutic activities;Functional mobility training;Gait training;Balance training;Neuromuscular re-education;Patient/family education   PT Next Visit Plan balance strategy activities/compliant surfaces, transfer training varied surfaces, Functional movement patterns-discuss discharge plans (PD ex classes, ?return PT eval)   Consulted and Agree with Plan of Care Patient        Problem List Patient Active Problem List   Diagnosis Date Noted  . Bladder cancer ([place]) 04/26/2014  . Right inguinal hernia 09/10/2011  . Direct inguinal hernia 08/07/2011  . Urinary frequency 03/18/2011  . Skin lesion 03/18/2011  . General medical examination 03/18/2011  .  Cerumen impaction 12/04/2010  . Nail fungus 10/14/2010  . Fatigue 10/14/2010  . RHINITIS 02/12/2010  . GERD 12/03/2009  . Depression 08/28/2009  . WEIGHT LOSS 08/28/2009  . PSA, INCREASED 06/11/2009  . BACK PAIN 01/09/2009  . NEOPLASM OF UNCERTAIN BEHAVIOR OF SKIN 07/05/2008  . PARKINSON'S DISEASE 05/13/2007  . Hyperlipidemia 02/28/2007  . ERECTILE DYSFUNCTION 02/28/2007  . Essential hypertension 02/28/2007  . OSTEOARTHRITIS 02/28/2007    MARRIOTT,AMY W. 02/19/2015, 2:19 PM  Frazier Butt., PT [facility] [address] [place], [place], [address] Phone: [phone]   Fax:  [phone]  Name: [name] MRN: [id number] Date of Birth: 1934/02/25

## 2015-02-21 ENCOUNTER — Encounter: Payer: Self-pay | Admitting: Speech Pathology

## 2015-02-21 ENCOUNTER — Ambulatory Visit: Payer: Medicare Other | Admitting: Physical Therapy

## 2015-02-21 ENCOUNTER — Ambulatory Visit: Payer: Medicare Other | Admitting: Occupational Therapy

## 2015-02-21 DIAGNOSIS — R269 Unspecified abnormalities of gait and mobility: Secondary | ICD-10-CM

## 2015-02-21 DIAGNOSIS — R258 Other abnormal involuntary movements: Secondary | ICD-10-CM

## 2015-02-21 DIAGNOSIS — R279 Unspecified lack of coordination: Secondary | ICD-10-CM

## 2015-02-21 DIAGNOSIS — R293 Abnormal posture: Secondary | ICD-10-CM

## 2015-02-21 DIAGNOSIS — R2681 Unsteadiness on feet: Secondary | ICD-10-CM

## 2015-02-21 DIAGNOSIS — R4189 Other symptoms and signs involving cognitive functions and awareness: Secondary | ICD-10-CM

## 2015-02-21 DIAGNOSIS — R278 Other lack of coordination: Secondary | ICD-10-CM

## 2015-02-21 NOTE — Therapy (Signed)
[facility] [address] [place], [place], [address] Phone: [phone]   Fax:  [phone]  Occupational Therapy Treatment  Patient Details  Name: [name] MRN: [id number] Date of Birth: Dec 09, 1933 No Data Recorded  Encounter Date: 02/21/2015      OT End of Session - 02/21/15 1410    Visit Number 10   Number of Visits 17   Date for OT Re-Evaluation 03/15/15   Authorization Type Blue Medicare, no visit limit, no auth required, g-code   Authorization - Visit Number 10   Authorization - Number of Visits 10   OT Start Time 1406   OT Stop Time 1445   OT Time Calculation (min) 39 min   Activity Tolerance Patient tolerated treatment well   Behavior During Therapy [facility] for tasks assessed/performed      Past Medical History  Diagnosis Date  . Hyperlipidemia   . Hypertension   . Parkinson disease ([place]) DX   2005    NEUROLOGIST-   DR TAT--  IDIOPATHIC PARKINSON'S /  TREMORS CONTROLLED WITH MEDS  . GERD (gastroesophageal reflux disease)   . Allergic rhinitis   . OAB (overactive bladder)   . Frequency of urination   . Urge urinary incontinence   . Nocturia   . BPH with elevated PSA   . Osteoarthritis     KNEES, SHOULDER  . DDD (degenerative disc disease), lumbar   . Hearing loss     does not wear his hearing aid  . Mild obstructive sleep apnea     PER PT  MILD OSA , NO CPAP RX,  STUDY DONE 2009  . Depression   . Bladder cancer [facility])     urologist-  dr eskridge--  low grade TA  . Dry mouth     USES BIOTIN SPRAY/ MOUTHWASH    Past Surgical History  Procedure Laterality Date  . Nasal sinus surgery  1982  . Inguinal hernia repair  03/09/2012    Procedure: HERNIA REPAIR INGUINAL ADULT;  Surgeon: Adin Hector, MD;  Location: [facility];  Service: General;  Laterality: Right;  . Insertion of mesh  03/09/2012    Procedure: INSERTION OF MESH;  Surgeon: Adin Hector, MD;  Location: [facility];  Service: General;   Laterality: Right;  . Removal benign cyst left forehead  1969  . Orif right arm fx  1949    HARDWARE REMOVED   . Cataract extraction w/ intraocular lens  implant, bilateral  2014  . Cystoscopy with stent placement Left 12/19/2013    Procedure: CYSTOSCOPY BLADDER BX, LEFT URETERAL STENT PLACEMENT , LEFT RETROGRADE AND PYLOGRAM;  Surgeon: Festus Aloe, MD;  [facility];  Service: Urology;  Laterality: Left;  . Transurethral resection of bladder tumor N/A 12/19/2013    Procedure:  TRANSURETHRAL RESECTION OF BLADDER TUMOR WITH GYRUS (TURBT-GYRUS);  Surgeon: Festus Aloe, MD;  Location: [facility];  Service: Urology;  Laterality: N/A;  . Cystoscopy w/ retrogrades Bilateral 12/14/2014    Procedure: CYSTOSCOPY BLADDER BIOPSY FULGERATION MITOMYCIN C BILATERAL RETROGRADE PYELOGRAM;  Surgeon: Festus Aloe, MD;  Location: [facility];  Service: Urology;  Laterality: Bilateral;    There were no vitals filed for this visit.  Visit Diagnosis:  Bradykinesia  Posture abnormality  Decreased coordination  Cognitive deficits      Subjective Assessment - 02/21/15 1713    Subjective  "Does this typically make this much of a difference?" (improvement in buttoning using strategy)  "  This will make it less frustrating when I dress"   Pertinent History Parkinson's disease    Patient Stated Goals unsure   Currently in Pain? No/denies                      OT Treatments/Exercises (OP) - 02/21/15 1437    ADLs   UB Dressing Practiced buttoning/unbuttoning shirt on tabletop.  Initially performed in 44mn 15.78sec, then pt instructed to and given min-mod cues to perform PWR! hands stretch between each button.  Pt completed same task in 158m 45.65sec using PWR! hands.  Discussed significance/importance of incorporating PWR! moves in ADLs.   Writing Wrote 5 sentences with good legibility and no significant decr in size with use of PWR!  hands stretch after each sentence after instruction.  Pt instructed in writing strategies for PD (verbally) and verbalized understanding.    Functional mobility:  Sit>stand and supine>sit during session with min cues to use big movement strategies       [facility] 02/21[facility] 1427    PWR! exercises Moves in supine   PWR! Up x10   PWR! Rock x10 to each side   PWR! Twist x10 to each   PWR! Step x10 to each side    Comments in supine with min cues for proper technique and big movements             OT Education - 02/21/15 1715    Education Details Review use of big movement strategies including PWR! hands with writing, buttoning, picking up objects.   Person(s) Educated Patient   Methods Explanation;Demonstration;Verbal cues   Comprehension Verbalized understanding;Returned demonstration          OT Short Term Goals - 02/19/15 1458    OT SHORT TERM GOAL #1   Title Pt will be independent with initial PD-specific HEP.--check STGs 02/13/15   Time 4   Period Weeks   Status On-going  02/14/15:  min cues   OT SHORT TERM GOAL #2   Title Pt will improve coordination for ADLs as shown by improving time on 9-hole peg test by at least 5 sec with dominant LUE.   Baseline 44.63sec   Time 4   Period Weeks   Status Achieved  02/19/15  36.22sec, 26.81sec   OT SHORT TERM GOAL #3   Title Pt will report incr ease with eating using adaptive strategies prn.   Time 4   Period Weeks   Status On-going  partially met 02/19/15   OT SHORT TERM GOAL #4   Title Pt will verbalize understanding of ways to prevent future complications and PD-related community resources.   Time 4   Period Weeks   Status Achieved  02/14/15   OT SHORT TERM GOAL #5   Title Pt will verbalize understanding of cognitive compensation strategies/ways to promote cognitive skills prn.   Time 8   Period Weeks   Status Achieved  02/19/15           OT Long Term Goals - 02/19/15 1608    OT LONG TERM GOAL #1   Title  Pt will verbalize understanding of AE/strategies to increase ease/safety with ADLs/IADLs prn.   Time 8   Period Weeks   Status Achieved  02/14/15 Verbalizes understanding, but needs cues/reinforcement for consistency   OT LONG TERM GOAL #2   Title Pt will improve coordination for ADLs as shown by improving time on 9-hole peg test by at least 10 sec with dominant LUE.  Baseline L-44.63sec   Time 8   Period Weeks   Status Achieved  02/19/15:  36.22, 26.81sec   OT LONG TERM GOAL #3   Title Pt will improve functional reaching/coordination as shown by improving time on box and blocks test by at least 5 bilaterally.   Baseline 44 bilaterally 01/22/15   Time 8   Period Weeks   Status New   OT LONG TERM GOAL #4   Title Pt will report incr ease with buttons, zippers, tying, belt.   Time 4   Period Days   Status New   OT LONG TERM GOAL #5   Title Pt will improve dressing ease as shown by improving time on PPT#4 by at least 5sec.   Baseline 24.77   Time 8   Period Weeks   Status New   OT LONG TERM GOAL #6   Title Pt will improve balance/functional reaching for ADLs as shown by improving standing functional reach test to at least 11inches with each UE.   Baseline R-9", L-10"   Time 8   Period Weeks   Status New               Plan - 06-Mar-2015 1410    Clinical Impression Statement Pt demo improved ability to perform ADLs/functional movements with strategies/techniques taught; however, cogntion and decr carryover with strategies and HEP have affected progress.  Pt would benefit from further occupational therapy to reinforce strategies for incr ease, safety, effeciency with ADLs to prevent future complications and improve quality of life.   Pt will benefit from skilled therapeutic intervention in order to improve on the following deficits (Retired) Decreased cognition;Decreased mobility;Impaired perceived functional ability;Impaired vision/preception;Impaired UE functional use;Decreased  knowledge of use of DME;Decreased balance;Decreased activity tolerance;Decreased coordination   Rehab Potential Good   Clinical Impairments Affecting Rehab Potential decr awareness of PD deficits, affect, cognitive deficits   OT Frequency 2x / week   OT Duration 8 weeks   OT Treatment/Interventions Self-care/ADL training   Plan check goals, plan to d/c next week   OT Home Exercise Plan Education provided:  01/21/15 Quadraped PWR!; 01/22/15 Supine PWR!; 02/05/15 ways to prevent future complication, POP community group; big movements with ADLs/strategies for ADLs, 02/14/15 PWR! hands, coordination HEP   Consulted and Agree with Plan of Care Patient          G-Codes - 03-06-15 1718    Functional Assessment Tool Used L 9-hole peg test:  32.22, 26.81sec, improving ability to complete fasteners and eating with incr ease/effeciency with strategies, instructed in PD-specific HEP but not performing regularly   Functional Limitation Self care   Self Care Current Status (H8887) At least 1 percent but less than 20 percent impaired, limited or restricted   Self Care Goal Status (N7972) At least 1 percent but less than 20 percent impaired, limited or restricted      Occupational Therapy Progress Note  Dates of Reporting Period: 01/10/15 to Mar 06, 2015  Objective Reports of Subjective Statement: see above  Objective Measurements: see above  Goal Update: see above  Plan: see above  Reason Skilled Services are Required: see above   Problem List Patient Active Problem List   Diagnosis Date Noted  . Bladder cancer ([place]) 04/26/2014  . Right inguinal hernia 09/10/2011  . Direct inguinal hernia 08/07/2011  . Urinary frequency 03/18/2011  . Skin lesion 03/18/2011  . General medical examination 03/18/2011  . Cerumen impaction 12/04/2010  . Nail fungus 10/14/2010  . Fatigue 10/14/2010  . RHINITIS  02/12/2010  . GERD 12/03/2009  . Depression 08/28/2009  . WEIGHT LOSS 08/28/2009  . PSA, INCREASED  06/11/2009  . BACK PAIN 01/09/2009  . NEOPLASM OF UNCERTAIN BEHAVIOR OF SKIN 07/05/2008  . PARKINSON'S DISEASE 05/13/2007  . Hyperlipidemia 02/28/2007  . ERECTILE DYSFUNCTION 02/28/2007  . Essential hypertension 02/28/2007  . OSTEOARTHRITIS 02/28/2007    [facility] 02/21/2015, 5:27 PM  [facility] [address] [place], [place], [address] Phone: [phone]   Fax:  [phone]  Name: [name] MRN: [id number] Date of Birth: March 24, 1934  Vianne Bulls, OTR/L 02/21/2015 5:27 PM

## 2015-02-21 NOTE — Therapy (Signed)
[facility] [address] [place], [place], [address] Phone: [phone]   Fax:  [phone]  Physical Therapy Treatment  Patient Details  Name: [name] MRN: [id number] Date of Birth: 1933-12-24 No Data Recorded  Encounter Date: 02/21/2015      PT End of Session - 02/22/15 1231    Visit Number 8   Number of Visits 17   Date for PT Re-Evaluation 03/17/15   Authorization Type Blue Medicare-G-code every 10th visit   PT Start Time 1319   PT Stop Time 1401   PT Time Calculation (min) 42 min   Activity Tolerance Patient tolerated treatment well   Behavior During Therapy [facility] for tasks assessed/performed      Past Medical History  Diagnosis Date  . Hyperlipidemia   . Hypertension   . Parkinson disease ([place]) DX   2005    NEUROLOGIST-   DR TAT--  IDIOPATHIC PARKINSON'S /  TREMORS CONTROLLED WITH MEDS  . GERD (gastroesophageal reflux disease)   . Allergic rhinitis   . OAB (overactive bladder)   . Frequency of urination   . Urge urinary incontinence   . Nocturia   . BPH with elevated PSA   . Osteoarthritis     KNEES, SHOULDER  . DDD (degenerative disc disease), lumbar   . Hearing loss     does not wear his hearing aid  . Mild obstructive sleep apnea     PER PT  MILD OSA , NO CPAP RX,  STUDY DONE 2009  . Depression   . Bladder cancer [facility])     urologist-  dr eskridge--  low grade TA  . Dry mouth     USES BIOTIN SPRAY/ MOUTHWASH    Past Surgical History  Procedure Laterality Date  . Nasal sinus surgery  1982  . Inguinal hernia repair  03/09/2012    Procedure: HERNIA REPAIR INGUINAL ADULT;  Surgeon: Adin Hector, MD;  Location: [facility];  Service: General;  Laterality: Right;  . Insertion of mesh  03/09/2012    Procedure: INSERTION OF MESH;  Surgeon: Adin Hector, MD;  Location: [facility];  Service: General;  Laterality: Right;  . Removal benign cyst left forehead  1969  . Orif right arm fx  1949   HARDWARE REMOVED   . Cataract extraction w/ intraocular lens  implant, bilateral  2014  . Cystoscopy with stent placement Left 12/19/2013    Procedure: CYSTOSCOPY BLADDER BX, LEFT URETERAL STENT PLACEMENT , LEFT RETROGRADE AND PYLOGRAM;  Surgeon: Festus Aloe, MD;  Location: [facility];  Service: Urology;  Laterality: Left;  . Transurethral resection of bladder tumor N/A 12/19/2013    Procedure:  TRANSURETHRAL RESECTION OF BLADDER TUMOR WITH GYRUS (TURBT-GYRUS);  Surgeon: Festus Aloe, MD;  Location: [facility];  Service: Urology;  Laterality: N/A;  . Cystoscopy w/ retrogrades Bilateral 12/14/2014    Procedure: CYSTOSCOPY BLADDER BIOPSY FULGERATION MITOMYCIN C BILATERAL RETROGRADE PYELOGRAM;  Surgeon: Festus Aloe, MD;  Location: [facility];  Service: Urology;  Laterality: Bilateral;    There were no vitals filed for this visit.  Visit Diagnosis:  Bradykinesia  Posture abnormality  Abnormality of gait  Unsteadiness      Subjective Assessment - 02/21/15 1320    Subjective Just feel so tired-sleep doesn't rejuvenate me like it used to.  Trying to incorporate the exercises into lifestyle, but it's hard and stressful.   Currently in Pain? Yes   Pain Score 3  Pain Location Back   Pain Orientation Lower   Pain Descriptors / Indicators Aching   Pain Onset In the past 7 days   Aggravating Factors  unsure what aggravates   Pain Relieving Factors pain medications                         [facility] Adult PT Treatment/Exercise - 02/21/15 1325    Transfers   Transfers Sit to Stand;Stand to Sit   Sit to Stand 5: Supervision   Sit to Stand Details (indicate cue type and reason) Verbal cues and tactil cues provided for increased momentum and increased forward lean to assist with transfer technique   Number of Reps 10 reps;Other sets (comment)  from 22", 20", and 18" surfaces       Neuro Re-education:  Compliant surface  balance activities-forward/back walking, forward/back marching, sidestepping, marching in place, walking forward with head turns and with head nods.  Pt slows with head turns and nods and requires min assist for balance and safety on compliant surface.  Discussed possibility of using walking poles or cane on outdoor surfaces for improved safety.    Self Care:  Discussed pt's feelings of fatigue-recommended that if fatigue is worsening or limiting daily activities, he should speak to Dr. Carles Collet.  Discussed pt's reported feelings of anxiety regarding trying to fit exercises and therapy HEP into his current routine.  Recommended Dr. Valentina Shaggy, neuropsychologist, if he feels he needs that service.  He reports he would like to wait and talk to Dr. Carles Collet at his next appointment.  Discussed upcoming start of [employer]oves exercise class, with pt declining to get the information due to "just not being an exercise group person and just not having time to fit that into my routine."      PT Education - 02/22/15 1234    Education provided Yes   Education Details Provided informaiton on PWR! MOves exercise class, importance of talking to physician about any symptom changes, including increased fatigue or anxiety.   Person(s) Educated Patient   Methods Explanation;Handout   Comprehension Verbalized understanding          PT Short Term Goals - 02/14/15 1323    PT SHORT TERM GOAL #1   Title Pt will perform HEP for improved functional mobility, gait, balance and transfers.  TARGET 02/14/15   Time 4   Period Weeks   Status Achieved   PT SHORT TERM GOAL #2   Title Pt will improve 5x sit<>stand to less than or equal to 18 seconds for improved transfer efficiency and safety.   Time 4   Period Weeks   Status Not Met   PT SHORT TERM GOAL #3   Title Pt will improve TUG score to less than or equal to 13.5 seconds for decreased fall risk.   Time 4   Period Weeks   Status Achieved   PT SHORT TERM GOAL #4   Title Pt  will improve gait velocity to at least 2.8 ft/sec for improved gait efficiency and safety.   Time 4   Period Weeks   Status Achieved           PT Long Term Goals - 01/16/15 1125    PT LONG TERM GOAL #1   Title Pt will verbalize understanding of fall prevention in the home environment.   TARGET 03/17/15   Time 8   Period Weeks   Status New   PT LONG TERM GOAL #2  Title Pt will improve 5x sit<>stand to less than or equal to 15 seconds for improved efficiency and safety with transfers.   Time 8   Period Weeks   Status New   PT LONG TERM GOAL #3   Title Pt will improve TUG cognitive score to less than or equal to 15 seconds for decreased fall risk and improved ability for dual tasking.   Time 8   Period Weeks   Status New   PT LONG TERM GOAL #4   Title Pt will improve Functional Gait Assessment score to at least 20/30 for improved functional mobility and decreased fall risk.   Time 8   Period Weeks   Status New   PT LONG TERM GOAL #5   Title Pt will verbalize plans for continue community fitness upon D/C from PT.   Time 8   Period Weeks   Status New               Plan - 02/22/15 1232    Clinical Impression Statement Pt has slowed transfers today and more difficulty with forward lean during transfers.  Pt overall does not appear to be doing his exercises at home and notes general fatigue/anxiety at trying to fit exercises into routine.  Will likely review HEP and options for community exercise, should pt choose to pursue them.  Will likely plan to discharge next week.   Pt will benefit from skilled therapeutic intervention in order to improve on the following deficits Abnormal gait;Decreased balance;Decreased mobility;Decreased strength;Difficulty walking;Postural dysfunction   Rehab Potential Good   PT Frequency 2x / week   PT Duration 8 weeks   PT Treatment/Interventions ADLs/Self Care Home Management;Therapeutic exercise;Therapeutic activities;Functional mobility  training;Gait training;Balance training;Neuromuscular re-education;Patient/family education   PT Next Visit Plan Review HEP/check goals and plan for discharge next week   Consulted and Agree with Plan of Care Patient     Plan:  Try walking poles or cane outdoors.   Problem List Patient Active Problem List   Diagnosis Date Noted  . Bladder cancer ([place]) 04/26/2014  . Right inguinal hernia 09/10/2011  . Direct inguinal hernia 08/07/2011  . Urinary frequency 03/18/2011  . Skin lesion 03/18/2011  . General medical examination 03/18/2011  . Cerumen impaction 12/04/2010  . Nail fungus 10/14/2010  . Fatigue 10/14/2010  . RHINITIS 02/12/2010  . GERD 12/03/2009  . Depression 08/28/2009  . WEIGHT LOSS 08/28/2009  . PSA, INCREASED 06/11/2009  . BACK PAIN 01/09/2009  . NEOPLASM OF UNCERTAIN BEHAVIOR OF SKIN 07/05/2008  . PARKINSON'S DISEASE 05/13/2007  . Hyperlipidemia 02/28/2007  . ERECTILE DYSFUNCTION 02/28/2007  . Essential hypertension 02/28/2007  . OSTEOARTHRITIS 02/28/2007    Onofrio Klemp W. 02/22/2015, 12:38 PM Frazier Butt., PT [place] [address] [place], [place], [address] Phone: [phone]   Fax:  [phone]  Name: Axcel Horsch MRN: [id number] Date of Birth: [birth year]

## 2015-02-26 ENCOUNTER — Encounter: Payer: Self-pay | Admitting: Occupational Therapy

## 2015-02-26 ENCOUNTER — Ambulatory Visit: Payer: Medicare Other | Admitting: Occupational Therapy

## 2015-02-26 ENCOUNTER — Encounter: Payer: Self-pay | Admitting: Speech Pathology

## 2015-02-26 ENCOUNTER — Ambulatory Visit: Payer: Medicare Other | Admitting: Physical Therapy

## 2015-02-26 DIAGNOSIS — R258 Other abnormal involuntary movements: Secondary | ICD-10-CM

## 2015-02-26 DIAGNOSIS — R4189 Other symptoms and signs involving cognitive functions and awareness: Secondary | ICD-10-CM | POA: Diagnosis not present

## 2015-02-26 DIAGNOSIS — R278 Other lack of coordination: Secondary | ICD-10-CM

## 2015-02-26 DIAGNOSIS — R279 Unspecified lack of coordination: Secondary | ICD-10-CM

## 2015-02-26 DIAGNOSIS — R2681 Unsteadiness on feet: Secondary | ICD-10-CM

## 2015-02-26 DIAGNOSIS — R293 Abnormal posture: Secondary | ICD-10-CM

## 2015-02-26 DIAGNOSIS — R269 Unspecified abnormalities of gait and mobility: Secondary | ICD-10-CM

## 2015-02-26 NOTE — Therapy (Signed)
[facility] [address] [place], [place], [address] Phone: [phone]   Fax:  [phone]  Occupational Therapy Treatment  Patient Details  Name: [name] MRN: [id number] Date of Birth: [birth year] No Data Recorded  Encounter Date: 02/26/2015      OT End of Session - 02/26/15 1327    Visit Number 11   Number of Visits 17   Date for OT Re-Evaluation 03/15/15   Authorization Type Blue Medicare, no visit limit, no auth required, g-code   Authorization - Visit Number 11   Authorization - Number of Visits 10   OT Start Time 1320   OT Stop Time 1400   OT Time Calculation (min) 40 min   Activity Tolerance Patient tolerated treatment well   Behavior During Therapy [facility] for tasks assessed/performed      Past Medical History  Diagnosis Date  . Hyperlipidemia   . Hypertension   . Parkinson disease ([place]) DX   2005    NEUROLOGIST-   DR TAT--  IDIOPATHIC PARKINSON'S /  TREMORS CONTROLLED WITH MEDS  . GERD (gastroesophageal reflux disease)   . Allergic rhinitis   . OAB (overactive bladder)   . Frequency of urination   . Urge urinary incontinence   . Nocturia   . BPH with elevated PSA   . Osteoarthritis     KNEES, SHOULDER  . DDD (degenerative disc disease), lumbar   . Hearing loss     does not wear his hearing aid  . Mild obstructive sleep apnea     PER PT  MILD OSA , NO CPAP RX,  STUDY DONE 2009  . Depression   . Bladder cancer [facility])     urologist-  dr eskridge--  low grade TA  . Dry mouth     USES BIOTIN SPRAY/ MOUTHWASH    Past Surgical History  Procedure Laterality Date  . Nasal sinus surgery  1982  . Inguinal hernia repair  03/09/2012    Procedure: HERNIA REPAIR INGUINAL ADULT;  Surgeon: Adin Hector, MD;  [facility];  Service: General;  Laterality: Right;  . Insertion of mesh  03/09/2012    Procedure: INSERTION OF MESH;  Surgeon: Adin Hector, MD;  [facility];  Service: General;   Laterality: Right;  . Removal benign cyst left forehead  1969  . Orif right arm fx  1949    HARDWARE REMOVED   . Cataract extraction w/ intraocular lens  implant, bilateral  2014  . Cystoscopy with stent placement Left 12/19/2013    Procedure: CYSTOSCOPY BLADDER BX, LEFT URETERAL STENT PLACEMENT , LEFT RETROGRADE AND PYLOGRAM;  Surgeon: Festus Aloe, MD;  Location: [facility];  Service: Urology;  Laterality: Left;  . Transurethral resection of bladder tumor N/A 12/19/2013    Procedure:  TRANSURETHRAL RESECTION OF BLADDER TUMOR WITH GYRUS (TURBT-GYRUS);  Surgeon: Festus Aloe, MD;  Location: [facility];  Service: Urology;  Laterality: N/A;  . Cystoscopy w/ retrogrades Bilateral 12/14/2014    Procedure: CYSTOSCOPY BLADDER BIOPSY FULGERATION MITOMYCIN C BILATERAL RETROGRADE PYELOGRAM;  Surgeon: Festus Aloe, MD;  Location: [facility];  Service: Urology;  Laterality: Bilateral;    There were no vitals filed for this visit.  Visit Diagnosis:  Bradykinesia  Posture abnormality  Unsteadiness  Decreased coordination  Cognitive deficits      Subjective Assessment - 02/26/15 1322    Subjective  Pt reports that it takes him a while to do his exercises (by  looking at handout).  pt reports improved buttoning with use of PWR! hands strategy   Pertinent History Parkinson's disease    Patient Stated Goals unsure   Currently in Pain? No/denies            [facility] OT Assessment - 02/26/15 0001    Coordination   Box and Blocks R-44, L-44 blocks                  OT Treatments/Exercises (OP) - 02/26/15 0001    ADLs   Overall ADLs Began checking remaining goals and discussing progress.--see goals section   UB Dressing Reviewed/practiced big movement strategies for donning/doffing jacket.  Pt needed min-mod cueing to utilize, but improved with repetition.  Pt reports using PWR! hands to help with buttoning with success.      Functional Mobility Functional step and reach with ea UE/LE with mod-max cueing with L side initially, but improved with repetition.  Reviewed use of large base of support and wt. shift for better balance during IADLs and rationale behind strategies.  Pt verbalized understanding.   Neurological Re-education Exercises   Reciprocal Movements Arm bike x11mn level 1 for reciprocal movement maintaining movement amplitude with focus on >50 rpm.  Pt able to do so with short dips under 50rpms, but able to incr speed with only 1-2 v.c.                  OT Short Term Goals - 02/26/15 1323    OT SHORT TERM GOAL #1   Title Pt will be independent with initial PD-specific HEP.--check STGs 02/13/15   Time 4   Period Weeks   Status Achieved  02/14/15:  min cues   OT SHORT TERM GOAL #2   Title Pt will improve coordination for ADLs as shown by improving time on 9-hole peg test by at least 5 sec with dominant LUE.   Baseline 44.63sec   Time 4   Period Weeks   Status Achieved  02/19/15  36.22sec, 26.81sec   OT SHORT TERM GOAL #3   Title Pt will report incr ease with eating using adaptive strategies prn.   Time 4   Period Weeks   Status On-going  partially met 02/19/15   OT SHORT TERM GOAL #4   Title Pt will verbalize understanding of ways to prevent future complications and PD-related community resources.   Time 4   Period Weeks   Status Achieved  02/14/15   OT SHORT TERM GOAL #5   Title Pt will verbalize understanding of cognitive compensation strategies/ways to promote cognitive skills prn.   Time 8   Period Weeks   Status Achieved  02/19/15           OT Long Term Goals - 02/26/15 1324    OT LONG TERM GOAL #1   Title Pt will verbalize understanding of AE/strategies to increase ease/safety with ADLs/IADLs prn.   Time 8   Period Weeks   Status Achieved  02/14/15 Verbalizes understanding, but needs cues/reinforcement for consistency   OT LONG TERM GOAL #2   Title Pt will  improve coordination for ADLs as shown by improving time on 9-hole peg test by at least 10 sec with dominant LUE.   Baseline L-44.63sec   Time 8   Period Weeks   Status Achieved  02/19/15:  36.22, 26.81sec   OT LONG TERM GOAL #3   Title Pt will improve functional reaching/coordination as shown by improving time on box and blocks test by  at least 5 bilaterally.   Baseline 44 bilaterally 01/22/15   Time 8   Period Weeks   Status Not Met  02/26/15:  44 blocks bilaterally   OT LONG TERM GOAL #4   Title Pt will report incr ease with buttons, zippers, tying, belt.   Time 4   Period Days   Status Achieved  02/26/15   OT LONG TERM GOAL #5   Title Pt will improve dressing ease as shown by improving time on PPT#4 by at least 5sec.   Baseline 24.77   Time 8   Period Weeks   Status New   OT LONG TERM GOAL #6   Title Pt will improve balance/functional reaching for ADLs as shown by improving standing functional reach test to at least 11inches with each UE.   Baseline R-9", L-10"   Time 8   Period Weeks   Status New               Plan - 02/26/15 1327    Clinical Impression Statement Pt demo improved ease with ADLs with use of strategies, but due to cognition/decr awareness, pt is not perfoming consistently.    Plan check remaining goals and d/c next session   OT Home Exercise Plan Education provided:  01/21/15 [employer]!; 01/22/15 Supine PWR!; 02/05/15 ways to prevent future complication, POP community group; big movements with ADLs/strategies for ADLs, 02/14/15 PWR! hands, coordination HEP   Consulted and Agree with Plan of Care Patient        Problem List Patient Active Problem List   Diagnosis Date Noted  . Bladder cancer ([place]) 04/26/2014  . Right inguinal hernia 09/10/2011  . Direct inguinal hernia 08/07/2011  . Urinary frequency 03/18/2011  . Skin lesion 03/18/2011  . General medical examination 03/18/2011  . Cerumen impaction 12/04/2010  . Nail fungus 10/14/2010  .  Fatigue 10/14/2010  . RHINITIS 02/12/2010  . GERD 12/03/2009  . Depression 08/28/2009  . WEIGHT LOSS 08/28/2009  . PSA, INCREASED 06/11/2009  . BACK PAIN 01/09/2009  . NEOPLASM OF UNCERTAIN BEHAVIOR OF SKIN 07/05/2008  . PARKINSON'S DISEASE 05/13/2007  . Hyperlipidemia 02/28/2007  . ERECTILE DYSFUNCTION 02/28/2007  . Essential hypertension 02/28/2007  . OSTEOARTHRITIS 02/28/2007    [facility] 02/26/2015, 5:18 PM  [facility] [address] [place], [place], [address] Phone: [phone]   Fax:  [phone]  Name: [name] MRN: [id number] Date of Birth: 10-12-1933  Vianne Bulls, OTR/L 02/26/2015 5:19 PM

## 2015-02-26 NOTE — Therapy (Signed)
[name] [address] [place] [name], [name], [address] Phone: [phone]   Fax:  [phone]  Physical Therapy Treatment  Patient Details  Name: [name] MRN: [id number] Date of Birth: October 22, 1933 No Data Recorded  Encounter Date: 02/26/2015      PT End of Session - 02/26/15 1618    Visit Number 9   Number of Visits 17   Date for PT Re-Evaluation 03/17/15   Authorization Type Blue Medicare-G-code every 10th visit   PT Start Time 1404   PT Stop Time 1446   PT Time Calculation (min) 42 min   Activity Tolerance Patient tolerated treatment well   Behavior During Therapy [facility] for tasks assessed/performed      Past Medical History  Diagnosis Date  . Hyperlipidemia   . Hypertension   . Parkinson disease ([place]) DX   2005    NEUROLOGIST-   DR TAT--  IDIOPATHIC PARKINSON'S /  TREMORS CONTROLLED WITH MEDS  . GERD (gastroesophageal reflux disease)   . Allergic rhinitis   . OAB (overactive bladder)   . Frequency of urination   . Urge urinary incontinence   . Nocturia   . BPH with elevated PSA   . Osteoarthritis     KNEES, SHOULDER  . DDD (degenerative disc disease), lumbar   . Hearing loss     does not wear his hearing aid  . Mild obstructive sleep apnea     PER PT  MILD OSA , NO CPAP RX,  STUDY DONE 2009  . Depression   . Bladder cancer [facility])     urologist-  dr eskridge--  low grade TA  . Dry mouth     USES BIOTIN SPRAY/ MOUTHWASH    Past Surgical History  Procedure Laterality Date  . Nasal sinus surgery  1982  . Inguinal hernia repair  03/09/2012    Procedure: HERNIA REPAIR INGUINAL ADULT;  Surgeon: Adin Hector, MD;  Location: [facility];  Service: General;  Laterality: Right;  . Insertion of mesh  03/09/2012    Procedure: INSERTION OF MESH;  Surgeon: Adin Hector, MD;  Location: [facility];  Service: General;  Laterality: Right;  . Removal benign cyst left forehead  1969  . Orif right arm fx  1949   HARDWARE REMOVED   . Cataract extraction w/ intraocular lens  implant, bilateral  2014  . Cystoscopy with stent placement Left 12/19/2013    Procedure: CYSTOSCOPY BLADDER BX, LEFT URETERAL STENT PLACEMENT , LEFT RETROGRADE AND PYLOGRAM;  Surgeon: Festus Aloe, MD;  Location: [facility];  Service: Urology;  Laterality: Left;  . Transurethral resection of bladder tumor N/A 12/19/2013    Procedure:  TRANSURETHRAL RESECTION OF BLADDER TUMOR WITH GYRUS (TURBT-GYRUS);  Surgeon: Festus Aloe, MD;  Location: [facility];  Service: Urology;  Laterality: N/A;  . Cystoscopy w/ retrogrades Bilateral 12/14/2014    Procedure: CYSTOSCOPY BLADDER BIOPSY FULGERATION MITOMYCIN C BILATERAL RETROGRADE PYELOGRAM;  Surgeon: Festus Aloe, MD;  Location: [facility];  Service: Urology;  Laterality: Bilateral;    There were no vitals filed for this visit.  Visit Diagnosis:  Abnormality of gait      Subjective Assessment - 02/26/15 1606    Subjective "i'm not sure why I cant clap like I used to.  I think its because I dont have any fat pad in my hands anymore."   Patient Stated Goals [name] goal for therapy is to improve balance and mobility.   Currently  in Pain? No/denies           PWR [facility]) - 02/26/15 1615    PWR! exercises Moves in supine;Moves in standing   PWR! Up 10   PWR! Rock 10   PWR! Twist 10   PWR! Step 10   Comments in supine-with min cues for technique   PWR! Up 10   PWR! Rock 10   PWR! Twist 10   PWR Step 10   Comments Standing with UE support of counter and min cues for technique             PT Education - 02/26/15 1617    Education provided Yes   Education Details Fall prevention   Person(s) Educated Patient   Methods Explanation;Handout   Comprehension Verbalized understanding          PT Short Term Goals - 02/14/15 1323    PT SHORT TERM GOAL #1   Title Pt will perform HEP for improved functional mobility, gait,  balance and transfers.  TARGET 02/14/15   Time 4   Period Weeks   Status Achieved   PT SHORT TERM GOAL #2   Title Pt will improve 5x sit<>stand to less than or equal to 18 seconds for improved transfer efficiency and safety.   Time 4   Period Weeks   Status Not Met   PT SHORT TERM GOAL #3   Title Pt will improve TUG score to less than or equal to 13.5 seconds for decreased fall risk.   Time 4   Period Weeks   Status Achieved   PT SHORT TERM GOAL #4   Title Pt will improve gait velocity to at least 2.8 ft/sec for improved gait efficiency and safety.   Time 4   Period Weeks   Status Achieved           PT Long Term Goals - 02/26/15 1617    PT LONG TERM GOAL #1   Title Pt will verbalize understanding of fall prevention in the home environment.   TARGET 03/17/15   Time 8   Period Weeks   Status Achieved   PT LONG TERM GOAL #2   Title Pt will improve 5x sit<>stand to less than or equal to 15 seconds for improved efficiency and safety with transfers.   Time 8   Period Weeks   Status New   PT LONG TERM GOAL #3   Title Pt will improve TUG cognitive score to less than or equal to 15 seconds for decreased fall risk and improved ability for dual tasking.   Time 8   Period Weeks   Status New   PT LONG TERM GOAL #4   Title Pt will improve Functional Gait Assessment score to at least 20/30 for improved functional mobility and decreased fall risk.   Time 8   Period Weeks   Status New   PT LONG TERM GOAL #5   Title Pt will verbalize plans for continue community fitness upon D/C from PT.   Time 8   Period Weeks   Status New               Plan - 02/26/15 1618    Clinical Impression Statement Pt met LTG #1.  Question how frequently pt is performing PWR! exercises at home as he continues to need cues on technique and unable to recall exercises.  Continue PT per POC.   Pt will benefit from skilled therapeutic intervention in order to improve on the following deficits  Abnormal  gait;Decreased balance;Decreased mobility;Decreased strength;Difficulty walking;Postural dysfunction   Rehab Potential Good   PT Frequency 2x / week   PT Duration 8 weeks   PT Treatment/Interventions ADLs/Self Care Home Management;Therapeutic exercise;Therapeutic activities;Functional mobility training;Gait training;Balance training;Neuromuscular re-education;Patient/family education   PT Next Visit Plan Finish checking goals and discharge per Mady Haagensen, PT.  G-code   Consulted and Agree with Plan of Care Patient        Problem List Patient Active Problem List   Diagnosis Date Noted  . Bladder cancer ([place]) 04/26/2014  . Right inguinal hernia 09/10/2011  . Direct inguinal hernia 08/07/2011  . Urinary frequency 03/18/2011  . Skin lesion 03/18/2011  . General medical examination 03/18/2011  . Cerumen impaction 12/04/2010  . Nail fungus 10/14/2010  . Fatigue 10/14/2010  . RHINITIS 02/12/2010  . GERD 12/03/2009  . Depression 08/28/2009  . WEIGHT LOSS 08/28/2009  . PSA, INCREASED 06/11/2009  . BACK PAIN 01/09/2009  . NEOPLASM OF UNCERTAIN BEHAVIOR OF SKIN 07/05/2008  . PARKINSON'S DISEASE 05/13/2007  . Hyperlipidemia 02/28/2007  . ERECTILE DYSFUNCTION 02/28/2007  . Essential hypertension 02/28/2007  . OSTEOARTHRITIS 02/28/2007    [name] 02/26/2015, 4:20 PM  [facility] [address] [place], [name], [address] Phone: [phone]   Fax:  [phone]  Name: [name] MRN: [id number] Date of Birth: 30-Jul-1933    [name], [place] 02/26/2015 4:21 PM Phone: [phone] Fax: [phone]

## 2015-02-26 NOTE — Patient Instructions (Signed)
It is important to avoid accidents which may result in broken bones.  Here are a few ideas on how to make your home safer so you will be less likely to trip or fall.   Use nonskid mats or non slip strips in your shower or tub, on your bathroom floor and around sinks.  If you know that you have spilled water, wipe it up!  In the bathroom, it is important to have properly installed grab bars on the walls or on the edge of the tub.  Towel racks are NOT strong enough for you to hold onto or to pull on for support.  Stairs and hallways should have enough light.  Add lamps or night lights if you need ore light.  It is good to have handrails on both sides of the stairs if possible.  Always fix broken handrails right away.  It is important to see the edges of steps.  Paint the edges of outdoor steps white so you can see them better.  Put colored tape on the edge of inside steps.  Throw-rugs are dangerous because they can slide.  Removing the rugs is the best idea, but if they must stay, add adhesive carpet tape to prevent slipping.  Do not keep things on stairs or in the halls.  Remove small furniture that blocks the halls as it may cause you to trip.  Keep telephone and electrical cords out of the way where you walk.  Always were sturdy, rubber-soled shoes for good support.  Never wear just socks, especially on the stairs.  Socks may cause you to slip or fall.  Do not wear full-length housecoats as you can easily trip on the bottom.   Place the things you use the most on the shelves that are the easiest to reach.  If you use a stepstool, make sure it is in good condition.  If you feel unsteady, DO NOT climb, ask for help.  If a health professional advises you to use a cane or walker, do not be ashamed.  These items can keep you from falling and breaking your bones.    Fall Prevention in the Home  Falls can cause injuries and can affect people from all age groups. There are many simple things that  you can do to make your home safe and to help prevent falls. WHAT CAN I DO ON THE OUTSIDE OF MY HOME?  Regularly repair the edges of walkways and driveways and fix any cracks.  Remove high doorway thresholds.  Trim any shrubbery on the main path into your home.  Use bright outdoor lighting.  Clear walkways of debris and clutter, including tools and rocks.  Regularly check that handrails are securely fastened and in good repair. Both sides of any steps should have handrails.  Install guardrails along the edges of any raised decks or porches.  Have leaves, snow, and ice cleared regularly.  Use sand or salt on walkways during winter months.  In the garage, clean up any spills right away, including grease or oil spills. WHAT CAN I DO IN THE BATHROOM?  Use night lights.  Install grab bars by the toilet and in the tub and shower. Do not use towel bars as grab bars.  Use non-skid mats or decals on the floor of the tub or shower.  If you need to sit down while you are in the shower, use a plastic, non-slip stool.Marland Kitchen  Keep the floor dry. Immediately clean up any water that spills  on the floor.  Remove soap buildup in the tub or shower on a regular basis.  Attach bath mats securely with double-sided non-slip rug tape.  Remove throw rugs and other tripping hazards from the floor. WHAT CAN I DO IN THE BEDROOM?  Use night lights.  Make sure that a bedside light is easy to reach.  Do not use oversized bedding that drapes onto the floor.  Have a firm chair that has side arms to use for getting dressed.  Remove throw rugs and other tripping hazards from the floor. WHAT CAN I DO IN THE KITCHEN?   Clean up any spills right away.  Avoid walking on wet floors.  Place frequently used items in easy-to-reach places.  If you need to reach for something above you, use a sturdy step stool that has a grab bar.  Keep electrical cables out of the way.  Do not use floor polish or wax  that makes floors slippery. If you have to use wax, make sure that it is non-skid floor wax.  Remove throw rugs and other tripping hazards from the floor. WHAT CAN I DO IN THE STAIRWAYS?  Do not leave any items on the stairs.  Make sure that there are handrails on both sides of the stairs. Fix handrails that are broken or loose. Make sure that handrails are as long as the stairways.  Check any carpeting to make sure that it is firmly attached to the stairs. Fix any carpet that is loose or worn.  Avoid having throw rugs at the top or bottom of stairways, or secure the rugs with carpet tape to prevent them from moving.  Make sure that you have a light switch at the top of the stairs and the bottom of the stairs. If you do not have them, have them installed. WHAT ARE SOME OTHER FALL PREVENTION TIPS?  Wear closed-toe shoes that fit well and support your feet. Wear shoes that have rubber soles or low heels.  When you use a stepladder, make sure that it is completely opened and that the sides are firmly locked. Have someone hold the ladder while you are using it. Do not climb a closed stepladder.  Add color or contrast paint or tape to grab bars and handrails in your home. Place contrasting color strips on the first and last steps.  Use mobility aids as needed, such as canes, walkers, scooters, and crutches.  Turn on lights if it is dark. Replace any light bulbs that burn out.  Set up furniture so that there are clear paths. Keep the furniture in the same spot.  Fix any uneven floor surfaces.  Choose a carpet design that does not hide the edge of steps of a stairway.  Be aware of any and all pets.  Review your medicines with your healthcare provider. Some medicines can cause dizziness or changes in blood pressure, which increase your risk of falling. Talk with your health care provider about other ways that you can decrease your risk of falls. This may include working with a physical  therapist or trainer to improve your strength, balance, and endurance.   This information is not intended to replace advice given to you by your health care provider. Make sure you discuss any questions you have with your health care provider.   Document Released: 03/27/2002 Document Revised: 08/21/2014 Document Reviewed: 05/11/2014 Elsevier Interactive Patient Education [employer].

## 2015-02-28 ENCOUNTER — Ambulatory Visit: Payer: Medicare Other

## 2015-02-28 ENCOUNTER — Ambulatory Visit: Payer: Medicare Other | Admitting: Occupational Therapy

## 2015-02-28 DIAGNOSIS — R278 Other lack of coordination: Secondary | ICD-10-CM

## 2015-02-28 DIAGNOSIS — R4189 Other symptoms and signs involving cognitive functions and awareness: Secondary | ICD-10-CM

## 2015-02-28 DIAGNOSIS — R2681 Unsteadiness on feet: Secondary | ICD-10-CM

## 2015-02-28 DIAGNOSIS — R293 Abnormal posture: Secondary | ICD-10-CM

## 2015-02-28 DIAGNOSIS — R269 Unspecified abnormalities of gait and mobility: Secondary | ICD-10-CM

## 2015-02-28 DIAGNOSIS — R258 Other abnormal involuntary movements: Secondary | ICD-10-CM

## 2015-02-28 DIAGNOSIS — R279 Unspecified lack of coordination: Secondary | ICD-10-CM

## 2015-02-28 NOTE — Therapy (Signed)
[facility] [address] [place], [place], [address] Phone: [phone]   Fax:  [phone]  Occupational Therapy Treatment  Patient Details  Name: [name] MRN: [id number] Date of Birth: 1933/08/10 No Data Recorded  Encounter Date: 02/28/2015      OT End of Session - 02/28/15 1458    Visit Number 12   Number of Visits 17   Date for OT Re-Evaluation 03/15/15   Authorization Type Blue Medicare, no visit limit, no auth required, g-code   Authorization - Visit Number 12   Authorization - Number of Visits 10   OT Start Time 1450   OT Stop Time 1530   OT Time Calculation (min) 40 min   Activity Tolerance Patient tolerated treatment well   Behavior During Therapy [facility] for tasks assessed/performed      Past Medical History  Diagnosis Date  . Hyperlipidemia   . Hypertension   . Parkinson disease ([place]) DX   2005    NEUROLOGIST-   DR TAT--  IDIOPATHIC PARKINSON'S /  TREMORS CONTROLLED WITH MEDS  . GERD (gastroesophageal reflux disease)   . Allergic rhinitis   . OAB (overactive bladder)   . Frequency of urination   . Urge urinary incontinence   . Nocturia   . BPH with elevated PSA   . Osteoarthritis     KNEES, SHOULDER  . DDD (degenerative disc disease), lumbar   . Hearing loss     does not wear his hearing aid  . Mild obstructive sleep apnea     PER PT  MILD OSA , NO CPAP RX,  STUDY DONE 2009  . Depression   . Bladder cancer [facility])     urologist-  dr eskridge--  low grade TA  . Dry mouth     USES BIOTIN SPRAY/ MOUTHWASH    Past Surgical History  Procedure Laterality Date  . Nasal sinus surgery  1982  . Inguinal hernia repair  03/09/2012    Procedure: HERNIA REPAIR INGUINAL ADULT;  Surgeon: Adin Hector, MD;  [facility]: [facility];  Service: General;  Laterality: Right;  . Insertion of mesh  03/09/2012    Procedure: INSERTION OF MESH;  Surgeon: Adin Hector, MD;  Location: [facility];  Service: General;   Laterality: Right;  . Removal benign cyst left forehead  1969  . Orif right arm fx  1949    HARDWARE REMOVED   . Cataract extraction w/ intraocular lens  implant, bilateral  2014  . Cystoscopy with stent placement Left 12/19/2013    Procedure: CYSTOSCOPY BLADDER BX, LEFT URETERAL STENT PLACEMENT , LEFT RETROGRADE AND PYLOGRAM;  Surgeon: Festus Aloe, MD;  Location: [facility];  Service: Urology;  Laterality: Left;  . Transurethral resection of bladder tumor N/A 12/19/2013    Procedure:  TRANSURETHRAL RESECTION OF BLADDER TUMOR WITH GYRUS (TURBT-GYRUS);  Surgeon: Festus Aloe, MD;  [facility];  Service: Urology;  Laterality: N/A;  . Cystoscopy w/ retrogrades Bilateral 12/14/2014    Procedure: CYSTOSCOPY BLADDER BIOPSY FULGERATION MITOMYCIN C BILATERAL RETROGRADE PYELOGRAM;  Surgeon: Festus Aloe, MD;  Location: [facility];  Service: Urology;  Laterality: Bilateral;    There were no vitals filed for this visit.  Visit Diagnosis:  Bradykinesia  Posture abnormality  Unsteadiness  Decreased coordination  Cognitive deficits      Subjective Assessment - 02/28/15 1458    Subjective  Pt reports that eating strategies have helped   Pertinent History Parkinson's disease  Patient Stated Goals unsure   Currently in Pain? No/denies                      OT Treatments/Exercises (OP) - 02/28/15 1611    ADLs   Overall ADLs Checked remaining goals and discussed progress--see goals section.  Long discussion regarding importance of exercise, preventing future complications, cycle of therapy and community exercise, and recommendation for re-evaluations in approx 6-7 months.  Emphasized importance of early intervention to prevent complications and progression to look for.  Pt verbalized understanding and agrees to re-evals.     Eating Reviewed eating strategies and pt reports utilizing with incr ease/success   UB  Dressing Review big movement strategies for donning/doffing jacket.  Pt able to return demo and demo improvement with repetition.  Instructed pt in importance of proper posture/head position related to ability to complete top button.   ADL Comments Pt instructed in PD-related community resources (including appropriate community exercise, webinars, breath web sessions, and POP community group).  Pt verbalized understanding.                  OT Short Term Goals - 02/28/15 1453    OT SHORT TERM GOAL #1   Title Pt will be independent with initial PD-specific HEP.--check STGs 02/13/15   Time 4   Period Weeks   Status Achieved  02/14/15:  min cues   OT SHORT TERM GOAL #2   Title Pt will improve coordination for ADLs as shown by improving time on 9-hole peg test by at least 5 sec with dominant LUE.   Baseline 44.63sec   Time 4   Period Weeks   Status Achieved  02/19/15  36.22sec, 26.81sec   OT SHORT TERM GOAL #3   Title Pt will report incr ease with eating using adaptive strategies prn.   Time 4   Period Weeks   Status Achieved  partially met 02/19/15; 02/28/15 met   OT SHORT TERM GOAL #4   Title Pt will verbalize understanding of ways to prevent future complications and PD-related community resources.   Time 4   Period Weeks   Status Achieved  02/14/15   OT SHORT TERM GOAL #5   Title Pt will verbalize understanding of cognitive compensation strategies/ways to promote cognitive skills prn.   Time 8   Period Weeks   Status Achieved  02/19/15           OT Long Term Goals - 02/28/15 1453    OT LONG TERM GOAL #1   Title Pt will verbalize understanding of AE/strategies to increase ease/safety with ADLs/IADLs prn.   Time 8   Period Weeks   Status Achieved  02/14/15 Verbalizes understanding, but needs cues/reinforcement for consistency   OT LONG TERM GOAL #2   Title Pt will improve coordination for ADLs as shown by improving time on 9-hole peg test by at least 10 sec with  dominant LUE.   Baseline L-44.63sec   Time 8   Period Weeks   Status Achieved  02/19/15:  36.22, 26.81sec   OT LONG TERM GOAL #3   Title Pt will improve functional reaching/coordination as shown by improving time on box and blocks test by at least 5 bilaterally.   Baseline 44 bilaterally 01/22/15   Time 8   Period Weeks   Status Not Met  02/26/15:  44 blocks bilaterally   OT LONG TERM GOAL #4   Title Pt will report incr ease with buttons, zippers, tying, belt.  Time 4   Period Days   Status Achieved  02/26/15   OT LONG TERM GOAL #5   Title Pt will improve dressing ease as shown by improving time on PPT#4 by at least 5sec.   Baseline 24.77   Time 8   Period Weeks   Status Not Met  2015-03-03  22.60sec (but incr ease)   OT LONG TERM GOAL #6   Title Pt will improve balance/functional reaching for ADLs as shown by improving standing functional reach test to at least 11inches with each UE.   Baseline R-9", L-10"   Time 8   Period Weeks   Status Partially Met  R-12", L-10"         OCCUPATIONAL THERAPY DISCHARGE SUMMARY  Visits from Start of Care: 12  Current functional level related to goals / functional outcomes: See above   Remaining deficits: Bradykinesia, rigidity, abnormal posture, decr coordination, decr balance for ADLs, cognitive deficits   Education / Equipment: Pt instructed in ways to prevent future complications, strategies for ADLs, PD education, PD-related community resources, PD-specific HEP.  Pt verbalized understanding of all education provided.    Plan: Patient agrees to discharge.  Patient goals were partially met. Patient is being discharged due to being pleased with the current functional level.  Pt would benefit from re-evaluation  in approx 6-7 months to assess for need for further therapy/functional changes due to progressive nature of diagnosis. ?????            Plan - Mar 03, 2015 1459    Clinical Impression Statement Pt has made progress  with ADLs using strategies.  Pt feels comfortable with OT d/c at this time.   Plan d/c OT, re-eval in approx 6-7 months.  Pt agrees   OT Home Exercise Plan Education provided:  01/21/15 Karn Cassis!; 01/22/15 Supine PWR!; 02/05/15 ways to prevent future complication, POP community group; big movements with ADLs/strategies for ADLs, 02/14/15 PWR! hands, coordination HEP   Consulted and Agree with Plan of Care Patient          G-Codes - 2015-03-03 1756    Functional Assessment Tool Used L 9-hole peg test:  32.22, 26.81sec, improved ability to complete fasteners and eat with incr ease/effeciency with strategies, educated in PD-specific, PPT#4 22.60sec with strategy   Functional Limitation Self care   Self Care Goal Status (W4097) At least 1 percent but less than 20 percent impaired, limited or restricted   Self Care Discharge Status [phone]) At least 1 percent but less than 20 percent impaired, limited or restricted      Problem List Patient Active Problem List   Diagnosis Date Noted  . Bladder cancer ([place]) 04/26/2014  . Right inguinal hernia 09/10/2011  . Direct inguinal hernia 08/07/2011  . Urinary frequency 03/18/2011  . Skin lesion 03/18/2011  . General medical examination 03/18/2011  . Cerumen impaction 12/04/2010  . Nail fungus 10/14/2010  . Fatigue 10/14/2010  . RHINITIS 02/12/2010  . GERD 12/03/2009  . Depression 08/28/2009  . WEIGHT LOSS 08/28/2009  . PSA, INCREASED 06/11/2009  . BACK PAIN 01/09/2009  . NEOPLASM OF UNCERTAIN BEHAVIOR OF SKIN 07/05/2008  . PARKINSON'S DISEASE 05/13/2007  . Hyperlipidemia 03-03-07  . ERECTILE DYSFUNCTION 03-03-07  . Essential hypertension 03-03-2007  . OSTEOARTHRITIS 03-Mar-2007    [facility] Mar 03, 2015, 6:03 PM  [facility] [address] [place], [place], [address] Phone: [phone]   Fax:  [phone]  Name: [name] MRN: [id number] Date of Birth:  [birth year] Levada Dy  Domingo Cocking, OTR/L 02/28/2015 6:03 PM

## 2015-02-28 NOTE — Therapy (Signed)
[place] [address] [place], [place], [address] Phone: [phone]   Fax:  [phone]  Physical Therapy Treatment  Patient Details  Name: [name] MRN: [id number] Date of Birth: 1933-05-07 No Data Recorded  Encounter Date: 02/28/2015      PT End of Session - 02/28/15 1350    Visit Number 10   Number of Visits 17   Date for PT Re-Evaluation 03/17/15   Authorization Type [employer]-G-code every 10th visit   PT Start Time 1516   PT Stop Time 1543   PT Time Calculation (min) 27 min   Equipment Utilized During Treatment Gait belt   Activity Tolerance Patient tolerated treatment well   Behavior During Therapy [facility] for tasks assessed/performed      Past Medical History  Diagnosis Date  . Hyperlipidemia   . Hypertension   . Parkinson disease ([place]) DX   2005    NEUROLOGIST-   DR TAT--  IDIOPATHIC PARKINSON'S /  TREMORS CONTROLLED WITH MEDS  . GERD (gastroesophageal reflux disease)   . Allergic rhinitis   . OAB (overactive bladder)   . Frequency of urination   . Urge urinary incontinence   . Nocturia   . BPH with elevated PSA   . Osteoarthritis     KNEES, SHOULDER  . DDD (degenerative disc disease), lumbar   . Hearing loss     does not wear his hearing aid  . Mild obstructive sleep apnea     PER PT  MILD OSA , NO CPAP RX,  STUDY DONE 2009  . Depression   . Bladder cancer [facility])     urologist-  dr eskridge--  low grade TA  . Dry mouth     USES BIOTIN SPRAY/ MOUTHWASH    Past Surgical History  Procedure Laterality Date  . Nasal sinus surgery  1982  . Inguinal hernia repair  03/09/2012    Procedure: HERNIA REPAIR INGUINAL ADULT;  Surgeon: Adin Hector, MD;  Location: [facility];  Service: General;  Laterality: Right;  . Insertion of mesh  03/09/2012    Procedure: INSERTION OF MESH;  Surgeon: Adin Hector, MD;  Location: [facility];  Service: General;  Laterality: Right;  . Removal benign cyst  left forehead  1969  . Orif right arm fx  1949    HARDWARE REMOVED   . Cataract extraction w/ intraocular lens  implant, bilateral  2014  . Cystoscopy with stent placement Left 12/19/2013    Procedure: CYSTOSCOPY BLADDER BX, LEFT URETERAL STENT PLACEMENT , LEFT RETROGRADE AND PYLOGRAM;  Surgeon: Festus Aloe, MD;  Location: [facility];  Service: Urology;  Laterality: Left;  . Transurethral resection of bladder tumor N/A 12/19/2013    Procedure:  TRANSURETHRAL RESECTION OF BLADDER TUMOR WITH GYRUS (TURBT-GYRUS);  Surgeon: Festus Aloe, MD;  Location: [facility];  Service: Urology;  Laterality: N/A;  . Cystoscopy w/ retrogrades Bilateral 12/14/2014    Procedure: CYSTOSCOPY BLADDER BIOPSY FULGERATION MITOMYCIN C BILATERAL RETROGRADE PYELOGRAM;  Surgeon: Festus Aloe, MD;  Location: [facility];  Service: Urology;  Laterality: Bilateral;    There were no vitals filed for this visit.  Visit Diagnosis:  Abnormality of gait  Posture abnormality  Unsteadiness  Bradykinesia      Subjective Assessment - 02/28/15 1318    Subjective Pt denied falls or changes since last visit.    Patient Stated Goals [name] goal for therapy is to improve balance and mobility.   Currently  in Pain? Yes   Pain Score 2    Pain Location Back   Pain Orientation Lower   Pain Descriptors / Indicators Aching   Pain Type Chronic pain   Pain Onset More than a month ago   Pain Frequency Constant   Aggravating Factors  not sure   Pain Relieving Factors medications            [facility] PT Assessment - 02/28/15 1331    Functional Gait  Assessment   Gait assessed  Yes   Gait Level Surface Walks 20 ft in less than 5.5 sec, no assistive devices, good speed, no evidence for imbalance, normal gait pattern, deviates no more than 6 in outside of the 12 in walkway width.  4.7 sec., no deviation from path   Change in Gait Speed Able to smoothly change walking speed without  loss of balance or gait deviation. Deviate no more than 6 in outside of the 12 in walkway width.   Gait with Horizontal Head Turns Performs head turns smoothly with slight change in gait velocity (eg, minor disruption to smooth gait path), deviates 6-10 in outside 12 in walkway width, or uses an assistive device.   Gait with Vertical Head Turns Performs task with slight change in gait velocity (eg, minor disruption to smooth gait path), deviates 6 - 10 in outside 12 in walkway width or uses assistive device   Gait and Pivot Turn Pivot turns safely in greater than 3 sec and stops with no loss of balance, or pivot turns safely within 3 sec and stops with mild imbalance, requires small steps to catch balance.   Step Over Obstacle Is able to step over one shoe box (4.5 in total height) without changing gait speed. No evidence of imbalance.   Gait with Narrow Base of Support Ambulates 7-9 steps.   Gait with Eyes Closed Walks 20 ft, slow speed, abnormal gait pattern, evidence for imbalance, deviates 10-15 in outside 12 in walkway width. Requires more than 9 sec to ambulate 20 ft.   Ambulating Backwards Walks 20 ft, uses assistive device, slower speed, mild gait deviations, deviates 6-10 in outside 12 in walkway width.   Steps Alternating feet, must use rail.   Total Score 21                     [facility] Adult PT Treatment/Exercise - 02/28/15 1319    Transfers   Five time sit to stand comments  22.3   Stand to Sit 5: Supervision   Ambulation/Gait   Ambulation/Gait Yes   Ambulation/Gait Assistance 6: Modified independent (Device/Increase time)  increased time   Ambulation Distance (Feet) 250 Feet   Assistive device None   Gait Pattern Step-through pattern;Poor foot clearance - left;Poor foot clearance - right   Ambulation Surface Level;Indoor   Gait velocity 2.24f/sec. in 12.2 seconds no AD   Standardized Balance Assessment   Standardized Balance Assessment Timed Up and Go Test   Timed  Up and Go Test   TUG Cognitive TUG   Normal TUG (seconds) 17.2  No AD and pt unable to count backwards while amb.           Self Care:     PT Education - 02/28/15 1349    Education provided Yes   Education Details PT discussed the importance of performing HEP and contacting Amy (primary PT) when he would like to start Parkinson's exercise class, as pt reported he can't until after the holidays are  over. PT discussed outcome measure results and progress towards goals. Pt reported he belongs to the [facility] and he walks his dog with his wife every day, Pt said he will consider joining Parkinson's class.   Person(s) Educated Patient   Methods Explanation   Comprehension Verbalized understanding          PT Short Term Goals - 02/14/15 1323    PT SHORT TERM GOAL #1   Title Pt will perform HEP for improved functional mobility, gait, balance and transfers.  TARGET 02/14/15   Time 4   Period Weeks   Status Achieved   PT SHORT TERM GOAL #2   Title Pt will improve 5x sit<>stand to less than or equal to 18 seconds for improved transfer efficiency and safety.   Time 4   Period Weeks   Status Not Met   PT SHORT TERM GOAL #3   Title Pt will improve TUG score to less than or equal to 13.5 seconds for decreased fall risk.   Time 4   Period Weeks   Status Achieved   PT SHORT TERM GOAL #4   Title Pt will improve gait velocity to at least 2.8 ft/sec for improved gait efficiency and safety.   Time 4   Period Weeks   Status Achieved           PT Long Term Goals - 2015-03-02 1352    PT LONG TERM GOAL #1   Title Pt will verbalize understanding of fall prevention in the home environment.   TARGET 03/17/15   Time 8   Period Weeks   Status Achieved   PT LONG TERM GOAL #2   Title Pt will improve 5x sit<>stand to less than or equal to 15 seconds for improved efficiency and safety with transfers.   Time 8   Period Weeks   Status Not Met   PT LONG TERM GOAL #3   Title Pt will improve TUG  cognitive score to less than or equal to 15 seconds for decreased fall risk and improved ability for dual tasking.   Time 8   Period Weeks   Status Not Met   PT LONG TERM GOAL #4   Title Pt will improve Functional Gait Assessment score to at least 20/30 for improved functional mobility and decreased fall risk.   Time 8   Period Weeks   Status Achieved   PT LONG TERM GOAL #5   Title Pt will verbalize plans for continue community fitness upon D/C from PT.   Time 8   Period Weeks   Status Partially Met               Plan - 03/02/2015 1351    Clinical Impression Statement Pt met LTG 4. Pt did not meet LTGs 2 or 3 and partially met LTG 5. Pt discharging per discussion with primary PT. Please see d/c summary for details.          G-Codes - March 02, 2015 1354    Functional Assessment Tool Used FGA 21/30; 5x sit<>stand 22.3, TUG cog 17.2 seconds, gait velocity 2.69 ft/sec   Functional Limitation Mobility: Walking and moving around   Mobility: Walking and Moving Around Goal Status [phone]) At least 1 percent but less than 20 percent impaired, limited or restricted   Mobility: Walking and Moving Around Discharge Status [phone]) At least 1 percent but less than 20 percent impaired, limited or restricted      Problem List Patient Active Problem List   Diagnosis  Date Noted  . Bladder cancer ([place]) 04/26/2014  . Right inguinal hernia 09/10/2011  . Direct inguinal hernia 08/07/2011  . Urinary frequency 03/18/2011  . Skin lesion 03/18/2011  . General medical examination 03/18/2011  . Cerumen impaction 12/04/2010  . Nail fungus 10/14/2010  . Fatigue 10/14/2010  . RHINITIS 02/12/2010  . GERD 12/03/2009  . Depression 08/28/2009  . WEIGHT LOSS 08/28/2009  . PSA, INCREASED 06/11/2009  . BACK PAIN 01/09/2009  . NEOPLASM OF UNCERTAIN BEHAVIOR OF SKIN 07/05/2008  . PARKINSON'S DISEASE 05/13/2007  . Hyperlipidemia 02/28/2007  . ERECTILE DYSFUNCTION 02/28/2007  . Essential hypertension  02/28/2007  . OSTEOARTHRITIS 02/28/2007    Miller,Jennifer L 02/28/2015, 1:55 PM  [place] [address] [place], [place], [address] Phone: [phone]   Fax:  [phone]  Name: [name] MRN: [id number] Date of Birth: 1933-08-23    Geoffry Paradise, PT,DPT 02/28/2015 1:55 PM Phone: [phone] Fax: [phone]

## 2015-03-06 ENCOUNTER — Encounter: Payer: Self-pay | Admitting: Occupational Therapy

## 2015-03-11 ENCOUNTER — Ambulatory Visit: Payer: Self-pay | Admitting: Physical Therapy

## 2015-03-26 ENCOUNTER — Encounter: Payer: Self-pay | Admitting: Physical Therapy

## 2015-03-26 NOTE — Therapy (Signed)
[facility] [address] [place], [place], [address] Phone: [phone]   Fax:  [phone]  Patient Details  Name: [name] MRN: [id number] Date of Birth: 10-04-1933 Referring Provider:  No ref. provider found  Encounter Date: 03/26/2015   PHYSICAL THERAPY DISCHARGE SUMMARY  Visits from Start of Care: 10  Current functional level related to goals / functional outcomes:     PT Long Term Goals - 02/28/15 1352    PT LONG TERM GOAL #1   Title Pt will verbalize understanding of fall prevention in the home environment.   TARGET 03/17/15   Time 8   Period Weeks   Status Achieved   PT LONG TERM GOAL #2   Title Pt will improve 5x sit<>stand to less than or equal to 15 seconds for improved efficiency and safety with transfers.   Time 8   Period Weeks   Status Not Met   PT LONG TERM GOAL #3   Title Pt will improve TUG cognitive score to less than or equal to 15 seconds for decreased fall risk and improved ability for dual tasking.   Time 8   Period Weeks   Status Not Met   PT LONG TERM GOAL #4   Title Pt will improve Functional Gait Assessment score to at least 20/30 for improved functional mobility and decreased fall risk.   Time 8   Period Weeks   Status Achieved   PT LONG TERM GOAL #5   Title Pt will verbalize plans for continue community fitness upon D/C from PT.   Time 8   Period Weeks   Status Partially Met         PT Short Term Goals - 02/14/15 1323    PT SHORT TERM GOAL #1   Title Pt will perform HEP for improved functional mobility, gait, balance and transfers.  TARGET 02/14/15   Time 4   Period Weeks   Status Achieved   PT SHORT TERM GOAL #2   Title Pt will improve 5x sit<>stand to less than or equal to 18 seconds for improved transfer efficiency and safety.   Time 4   Period Weeks   Status Not Met   PT SHORT TERM GOAL #3   Title Pt will improve TUG score to less than or equal to 13.5 seconds  for decreased fall risk.   Time 4   Period Weeks   Status Achieved   PT SHORT TERM GOAL #4   Title Pt will improve gait velocity to at least 2.8 ft/sec for improved gait efficiency and safety.   Time 4   Period Weeks   Status Achieved      Pt has met 2 of 5 long term goals.  Pt has met 3 of 4 short term goals.  [name] compliance with exercise program at home has been questionable, and PT has encouraged pt in multiple ways to increase physical activity and exercise in multiple ways at home.   Remaining deficits: Bradykinesia, difficulty with dual tasking with gait   Education / Equipment: Pt has been educated in HEP, fall prevention and community fitness upon D/C from PT.  Plan: Patient agrees to discharge.  Patient goals were partially met. Patient is being discharged due to being pleased with the current functional level.  ?????      Mady Haagensen, PT 03/26/2015 8:23 AM Phone: [phone] Fax: [phone]

## 2015-03-31 ENCOUNTER — Other Ambulatory Visit: Payer: Self-pay | Admitting: Neurology

## 2015-04-01 NOTE — Telephone Encounter (Signed)
Carbidopa Levodopa 25/100 refill requested. Per last office note- patient to remain on medication. Refill approved and sent to patient's pharmacy.   

## 2015-04-02 ENCOUNTER — Encounter: Payer: Self-pay | Admitting: Neurology

## 2015-04-02 ENCOUNTER — Ambulatory Visit (INDEPENDENT_AMBULATORY_CARE_PROVIDER_SITE_OTHER): Payer: Medicare Other | Admitting: Neurology

## 2015-04-02 VITALS — BP 142/70 | HR 84 | Ht 70.0 in | Wt 177.0 lb

## 2015-04-02 DIAGNOSIS — G20 Parkinson's disease: Secondary | ICD-10-CM

## 2015-04-02 DIAGNOSIS — F331 Major depressive disorder, recurrent, moderate: Secondary | ICD-10-CM

## 2015-04-02 DIAGNOSIS — R682 Dry mouth, unspecified: Secondary | ICD-10-CM | POA: Diagnosis not present

## 2015-04-02 MED ORDER — ESCITALOPRAM OXALATE 20 MG PO TABS: 20.0000 mg | ORAL_TABLET | Freq: Every day | ORAL | Status: DC

## 2015-04-02 NOTE — Patient Instructions (Addendum)
1.  Increase lexapro - 20 mg - 1 tablet at night 2.  The driving company is called the evaluator and the phone number is [phone] or [phone] and his name is Lawanna Kobus.  His email is [email or web address]  one: [id number] Cell: [phone] Fax: [phone] e-

## 2015-04-02 NOTE — Progress Notes (Signed)
[name] was seen today in the movement disorders clinic for neurologic consultation at the request of Annye Asa, MD.  The consultation is for the evaluation of PD.  The pt is accompanied by his wife who supplements the hx.  The patient was previously seen by [name] and I did have the opportunity to review those records.  Patient is wishing to transfer care because of the length of travel to [facility].  The first symptom(s) the patient noticed was unilateral hand tremor of the L hand and this was approximately 2005.  Pt is L hand dominant.   Pt went to his PCP and he was referred to [facility] and was dx with PD.  I do not have those records. Pt believes that he was started on requip but doesn't think that it helped.  He thinks that he was on it for about a year and his wife believes that it caused sleep attacks.  He is not sure what the next medication was but the physician at [facility] left and he was then referred to [name] and he has been seeing him and his NP since.  The patient is currently on carbidopa/levodopa 50/200 CR 1-1/2 tablets at 8 AM/ and then one at noon/4pm/8pm along with a carbidopa/levodopa 25/100 in the afternoon and in the evening.  He admits that he often misses dosages.  He is on amantadine 100 mg 4 times a day and selegiline 5 mg twice a day.  01/02/14 update:  The patient returns today for followup.  Last visit, I discontinued the patient's extended release levodopa as he was on a strange combination of immediate release and extended release carbidopa/levodopa.  He is currently on carbidopa/levodopa 25/100 IR and takes 2 tablets at 8 AM/noon/4 PM/8 PM and then carbidopa/levodopa 50/200 at night.  Pt states that the first week after we changed, he felt very sleepy but he no longer feels that way.  I asked him to move his selegiline to 8 AM and noon because of reported insomnia.  The patient did have a modified barium swallow test done since last  visit.  That was normal.  Since our last visit, the patient did have a bladder neoplasm removed on 12/19/2013 and a stent was placed.  The patient has recovered well.  He still has quite a bit of hematuria though.  He does need the stent removed.  No falls.  Limited exercise.  Was referred to neurorehab since last visit but states that he chose not to go.  Going to see mental health counselor at [facility] and that is helping.  Remains on amantadine.  04/30/14 update:   He is currently on carbidopa/levodopa 25/100 IR and takes 2 tablets at 8 AM/noon/4 PM/8 PM and then carbidopa/levodopa 50/200 at night. He is still on selegeline 5 mg bid and amantadine 100 mg.  States that often times, he is taking the selegeline too late and then has trouble sleeping.  He on Lexapro for depression.  He has not had any falls since last visit.  Not formally exercising; "I know that it needs to be done but I cannot seem to get it in the routine."  No hallucinations.  Finds that he is sleeping a bit more during the day.  Did start vesicare and that may have affected balance.  It may have helped the bladder, however.     07/30/14 update:  He is currently on carbidopa/levodopa 25/100 IR and takes 2 tablets  at 8 AM/noon/4 PM/8 PM and then carbidopa/levodopa 50/200 at night.  He is also on selegiline twice per day.  He takes amantadine 100 mg tid.  I reviewed records made available to me since last visit.  The patient went to the emergency room on 06/14/2014.  He accidentally hit his head against the car door (it was a windy day and the car door shut on him) and sustained a laceration above the left forehead.  There was no loss of consciousness or alteration of consciousness.  Dermabond was placed and the patient was sent home from the emergency room.  He c/o dry mouth and thinks that it is due to medication.  He signed up at the [facility] for exercise.  He just signed up Saturday afternoon.  No lightheadness/near syncope.   No hallucinations.  Feels a little "dopey" in the AM.  Once he takes his medications, he feels less cognitively dull.  However, he also thinks his medications may contribute to cognitive dulling.  He also complains of decreased feeling in the first to third fingers (thumb, pointer and middle) bilaterally.  He has difficulty with grasp and dropping objects.  He notices it most when he is trying to text and has to use a stylus for that.  11/29/14 update:  Pt is seen today in f/u.  He remains on carbidopa/levodopa 25/100, 2 tablets at 8 AM/12 PM/4 PM/8 p.m. in addition to carbidopa/levodopa 50/200 at night.  He is on selegiline at 8 AM and noon and amantadine, 100 mg 3 times a day.  I reviewed notes from his primary care provider.  He saw her on 11/04/2014.  He was reporting increasing depression.  He was given numbers to contact for counseling.  He hasn't done that yet.  His wife got a promotion at work and he misses having her home. He remains on Lexapro. He has no SI/HI.  He reports that he has good and bad days but is overall the same as previous.  He reports that a few weeks ago he started to have the need for an afternoon nap.  He is only asleep for 15-20 minutes (he is unsure if the unscheduled nap happens before or after the second dose of selegeline).  He is having some difficulty with his voice.  He also c/o dry mouth despite biotene. He did have a fall since last visit in his garage; states that he lost balance and went to the left and got some superficial abrasions.  Admits that while he joined [employer], he is going very little.  He doesn't like going without his wife.  Notes more difficulty tying shoes on the right.  04/02/15 update:  The patient is seen in follow-up today, accompanied by his wife who supplements the history.  I have reviewed records since last visit.  He remains on carbidopa/levodopa 25/100, 2 tablets at 8 AM/12 PM/4 PM/8 PM in addition to carbidopa/levodopa 50/200 at night.  He is on  selegiline, 5 mg twice a day and amantadine, 100 mg 3 times per day.  He was complaining about dry mouth last time and I told him he could try and decrease the amantadine to see if that helped, and he did that.  He continues to c/o dry mouth.  He states that dry mouth is even worse.  He has other medication reasons for dry mouth as well, including the Vesicare.  He thinks that dropping the amantadine caused him to be more sleepy.  He did, however,  move his Lexapro to taking that to earlier in the day to see if it helped his anxiety.  He admits to increased anxiety and fears about Parkinson's disease symptoms getting worse.  He states that he has been losing weight for many months (weight has been stable here).  I asked him about depression and his wife definitely endorsed this although he initially denied.  He did undergo a cystoscopy on 12/14/2014.  Those records were reviewed.  A low grade recurrence of his bladder cancer was noted. The patient denies any falls since last visit but has had some near falls.  No visual hallucinations but has some auditory hallucinations (hears people speaking).  No lightheadedness or near syncope.  His swallowing has been good as long as he is careful.  He did attend therapy at the neurorehab center since our last visit.  He hasn't been exercising much since leaving therapy.  States that he has absolutely no time to exercise and becomes very defensive when asked about exercise.  He states that he refuses to do this.  He and his wife brought up concerns about driving and slowed reflexes.   PREVIOUS MEDICATIONS: Sinemet, Sinemet CR, Requip and Eldpryl Requip (sleep attacks)  ALLERGIES:  No Known Allergies  CURRENT MEDICATIONS:  Current Outpatient Prescriptions on File Prior to Visit  Medication Sig Dispense Refill  . amantadine (SYMMETREL) 100 MG capsule Take 1 capsule (100 mg total) by mouth 3 (three) times daily. (Patient taking differently: Take 100 mg by mouth 2 (two)  times daily. ) 90 capsule 5  . aspirin 81 MG tablet Take 2 tablets (162 mg total) by mouth daily. 30 tablet   . atorvastatin (LIPITOR) 40 MG tablet Take 1 tablet (40 mg total) by mouth daily. (Patient taking differently: Take 40 mg by mouth every evening. ) 90 tablet 1  . Ca Carbonate-Mag Hydroxide (ROLAIDS PO) Take by mouth as needed.    . carbidopa-levodopa (SINEMET CR) 50-200 MG per tablet TAKE ONE TABLET BY MOUTH NIGHTLY AT BEDTIME 30 tablet 5  . carbidopa-levodopa (SINEMET IR) 25-100 MG tablet TAKE TWO TABLETS BY MOUTH FOUR TIMES DAILY 240 tablet 5  . celecoxib (CELEBREX) 200 MG capsule TAKE ONE CAPSULE EVERY DAY 30 capsule 3  . diclofenac sodium (VOLTAREN) 1 % GEL Apply topically as needed.    Marland Kitchen escitalopram (LEXAPRO) 10 MG tablet TAKE ONE TABLET BY MOUTH EVERY EVENING 30 tablet 5  . fexofenadine (ALLEGRA) 180 MG tablet Take 180 mg by mouth daily.     . fluorouracil (EFUDEX) 5 % cream Apply topically 2 (two) times daily.    . fluticasone (FLONASE) 50 MCG/ACT nasal spray USE TWO SPRAYS IN EACH NOSTRIL DAILY  16 g 3  . ibuprofen (ADVIL,MOTRIN) 200 MG tablet Take 200 mg by mouth every 6 (six) hours as needed.    Marland Kitchen losartan (COZAAR) 100 MG tablet Take 1 tablet (100 mg total) by mouth daily. (Patient taking differently: Take 100 mg by mouth every evening. ) 30 tablet 6  . oxyCODONE-acetaminophen (PERCOCET) 5-325 MG per tablet Take 1 tablet by mouth every 4 (four) hours as needed. For back pain    . phenazopyridine (PYRIDIUM) 100 MG tablet Take 1 tablet (100 mg total) by mouth 3 (three) times daily as needed for pain. 10 tablet 0  . polyethylene glycol (MIRALAX / GLYCOLAX) packet Take 17 g by mouth daily as needed. For laxative    . Probiotic Product (PROBIOTIC DAILY PO) Take 1 capsule by mouth daily.    Marland Kitchen  selegiline (ELDEPRYL) 5 MG tablet TAKE ONE TABLET BY MOUTH TWO TIMES DAILY WITH A MEAL. LAST DOSE NO LATER THAN 2 PM. 60 tablet 5  . tamsulosin (FLOMAX) 0.4 MG CAPS capsule Take 0.4 mg by mouth  daily after supper.     . VESICARE 10 MG tablet Take 10 mg by mouth every evening.   10   No current facility-administered medications on file prior to visit.    PAST MEDICAL HISTORY:   Past Medical History  Diagnosis Date  . Hyperlipidemia   . Hypertension   . Parkinson disease ([place]) DX   2005    NEUROLOGIST-   DR TAT--  IDIOPATHIC PARKINSON'S /  TREMORS CONTROLLED WITH MEDS  . GERD (gastroesophageal reflux disease)   . Allergic rhinitis   . OAB (overactive bladder)   . Frequency of urination   . Urge urinary incontinence   . Nocturia   . BPH with elevated PSA   . Osteoarthritis     KNEES, SHOULDER  . DDD (degenerative disc disease), lumbar   . Hearing loss     does not wear his hearing aid  . Mild obstructive sleep apnea     PER PT  MILD OSA , NO CPAP RX,  STUDY DONE 2009  . Depression   . Bladder cancer [facility])     urologist-  dr eskridge--  low grade TA  . Dry mouth     USES BIOTIN SPRAY/ MOUTHWASH    PAST SURGICAL HISTORY:   Past Surgical History  Procedure Laterality Date  . Nasal sinus surgery  1982  . Inguinal hernia repair  03/09/2012    Procedure: HERNIA REPAIR INGUINAL ADULT;  Surgeon: Adin Hector, MD;  Location: [facility];  Service: General;  Laterality: Right;  . Insertion of mesh  03/09/2012    Procedure: INSERTION OF MESH;  Surgeon: Adin Hector, MD;  Location: [facility];  Service: General;  Laterality: Right;  . Removal benign cyst left forehead  1969  . Orif right arm fx  1949    HARDWARE REMOVED   . Cataract extraction w/ intraocular lens  implant, bilateral  2014  . Cystoscopy with stent placement Left 12/19/2013    Procedure: CYSTOSCOPY BLADDER BX, LEFT URETERAL STENT PLACEMENT , LEFT RETROGRADE AND PYLOGRAM;  Surgeon: Festus Aloe, MD;  Location: [facility];  Service: Urology;  Laterality: Left;  . Transurethral resection of bladder tumor N/A 12/19/2013    Procedure:  TRANSURETHRAL RESECTION OF BLADDER TUMOR WITH GYRUS  (TURBT-GYRUS);  Surgeon: Festus Aloe, MD;  Location: [facility];  Service: Urology;  Laterality: N/A;  . Cystoscopy w/ retrogrades Bilateral 12/14/2014    Procedure: CYSTOSCOPY BLADDER BIOPSY FULGERATION MITOMYCIN C BILATERAL RETROGRADE PYELOGRAM;  Surgeon: Festus Aloe, MD;  Location: [facility];  Service: Urology;  Laterality: Bilateral;    SOCIAL HISTORY:   Social History   Social History  . Marital Status: Married    Spouse Name: N/A  . Number of Children: N/A  . Years of Education: N/A   Occupational History  . Not on file.   Social History Main Topics  . Smoking status: Former Smoker -- 1.00 packs/day for 15 years    Types: Cigarettes    Quit date: 04/20/1968  . Smokeless tobacco: Never Used  . Alcohol Use: 4.2 oz/week    7 Glasses of wine per week     Comment: ONE WINE PER DAY  . Drug Use: No  . Sexual Activity: Not on  file   Other Topics Concern  . Not on file   Social History Narrative    FAMILY HISTORY:   Family Status  Relation Status Death Age  . Mother Deceased     alzeihmer's disease  . Father Deceased     liver cancer, colon cancer  . Brother Alive   . Brother Deceased     killed   . Daughter Alive     ROS:  A complete 10 system review of systems was obtained and was unremarkable apart from what is mentioned above.  PHYSICAL EXAMINATION:    VITALS:   Filed Vitals:   04/02/15 1124  BP: 142/70  Pulse: 84  Height: 5\' 10"  (1.778 m)  Weight: 177 lb (80.287 kg)   Wt Readings from Last 3 Encounters:  04/02/15 177 lb (80.287 kg)  12/14/14 178 lb (80.74 kg)  11/29/14 178 lb (80.74 kg)    GEN:  The patient appears stated age and is in NAD. HEENT:  Normocephalic, atraumatic.  The mucous membranes are moist. The superficial temporal arteries are without ropiness or tenderness. CV:  RRR Lungs:  CTAB Neck/HEME:  There are no carotid bruits bilaterally.  Neurological examination:  Orientation: The patient  is alert and oriented x3. Cranial nerves: There is good facial symmetry.  There is mild facial hypomimia.   Extraocular muscles are intact. The visual fields are full to confrontational testing. The speech is fluent and clear.  The patient has some difficulty with the guttural sounds.  He is mildly hypophonic.  Soft palate rises symmetrically and there is no tongue deviation. Hearing is intact to conversational tone. Sensation: Sensation is intact to light touch throughout. Motor: Strength is 5/5 in the bilateral upper and lower extremities.   Shoulder shrug is equal and symmetric.  There is no pronator drift.   Movement examination: Tone: There is normal tone in the bilateral upper extremities.  The tone in the lower extremities is normal.  Abnormal movements: There is minor dyskinesia in the left arm and left foot. Coordination:  There is good rapid alternating movements today. Gait and Station: The patient has no significant difficulty arising out of a deep-seated chair without the use of the hands. The patient's stride length is slightly decreased.  There is slight dyskinesia in the left hand with ambulation.  There is mild camptocormia to the L.  There is decreased arm swing on the right  ASSESSMENT/PLAN:  1.  Idiopathic Parkinson's disease  -He will continue taking carbidopa/levodopa 25/100 IR 2 tablets at 8 AM/noon/4 PM/8 PM and then take the 50/200 CR at bedtime.  -For now, the patient will remain on selegiline at 8 AM and noon.  He will remain on amantadine, 100 mg twice per day  -I. encouraged again safe, cardiovascular exercise.  He just got a membership at the [facility] but isn't really going and encouraged him to do so.    -I will send him to the neuro rehabilitation center for PT/OT/ST 2.  Dysphagia.  -MBE on 09/29/13 was normal 3.  Depression.  -He is on Lexapro.  I am going to try to increase this to 20 mg daily.  We talked about the interaction between Lexapro and selegiline.  -I  encouraged him to attend counseling as did Dr. Birdie Riddle  -Think this is a much bigger issue then he is letting on.  Think is very angry that his wife is working outside of the home most of the day. 4.  mild cognitive impairment.  -This  is likely related to the Parkinson's, but I also think that depression contributes to memory change.  I do not see dementia at this point.  Talked about possible contribution of selegeline, amantadine, vesicare.  Not sure that any of these is contributing but it is possible.   -Talked to him about a driving evaluation.  Talked to him about local driving programs through [place] and through [place].  In the end, they would rather have an on road evaluation and they gave him the phone number to set this up. 5.  Urinary incontinence  -on vesicare.  Thinks may have affected the balance but no falls and doing okay.  Thinks that may have made dry mouth worse. 6.  Dry mouth  -c/o of this again today.  Has repetitively complained about this.  Dropping down the amantadine did not help.  Vesicare may be part of the issue.     7.  I will plan on seeing him back in the next few months, sooner should new neurologic issues arise.  Much greater than 50% of this visit was spent in counseling with the patient and the family.  Total face to face time:  60 min

## 2015-04-20 ENCOUNTER — Other Ambulatory Visit: Payer: Self-pay | Admitting: Family Medicine

## 2015-04-23 NOTE — Telephone Encounter (Signed)
Medication filled to pharmacy as requested.   

## 2015-04-28 ENCOUNTER — Other Ambulatory Visit: Payer: Self-pay | Admitting: Neurology

## 2015-04-28 ENCOUNTER — Other Ambulatory Visit: Payer: Self-pay | Admitting: Family Medicine

## 2015-04-29 ENCOUNTER — Encounter: Payer: Medicare Other | Admitting: Family Medicine

## 2015-04-30 NOTE — Telephone Encounter (Signed)
Rx sent to the pharmacy by e-script.//AB/CMA 

## 2015-05-01 ENCOUNTER — Ambulatory Visit (INDEPENDENT_AMBULATORY_CARE_PROVIDER_SITE_OTHER): Payer: Medicare Other | Admitting: Family Medicine

## 2015-05-01 ENCOUNTER — Encounter: Payer: Self-pay | Admitting: Family Medicine

## 2015-05-01 VITALS — BP 126/60 | HR 83 | Temp 97.5°F | Ht 70.0 in | Wt 176.0 lb

## 2015-05-01 DIAGNOSIS — I10 Essential (primary) hypertension: Secondary | ICD-10-CM | POA: Diagnosis not present

## 2015-05-01 DIAGNOSIS — Z0000 Encounter for general adult medical examination without abnormal findings: Secondary | ICD-10-CM

## 2015-05-01 DIAGNOSIS — C670 Malignant neoplasm of trigone of bladder: Secondary | ICD-10-CM | POA: Diagnosis not present

## 2015-05-01 DIAGNOSIS — E785 Hyperlipidemia, unspecified: Secondary | ICD-10-CM

## 2015-05-01 DIAGNOSIS — G20 Parkinson's disease: Secondary | ICD-10-CM

## 2015-05-01 LAB — BASIC METABOLIC PANEL
BUN: 27 mg/dL — ABNORMAL HIGH (ref 6–23)
CALCIUM: 9.3 mg/dL (ref 8.4–10.5)
CO2: 30 meq/L (ref 19–32)
CREATININE: 1.13 mg/dL (ref 0.40–1.50)
Chloride: 104 mEq/L (ref 96–112)
GFR: 66.52 mL/min (ref >60.00)
Glucose, Bld: 82 mg/dL (ref 70–99)
Potassium: 3.8 mEq/L (ref 3.5–5.1)
Sodium: 140 mEq/L (ref 135–145)

## 2015-05-01 LAB — LIPID PANEL
CHOL/HDL RATIO: 3
Cholesterol: 118 mg/dL (ref 0–200)
HDL: 46.4 mg/dL (ref >39.00)
LDL CALC: 59 mg/dL (ref 0–99)
NONHDL: 71.73
Triglycerides: 65 mg/dL (ref 0.0–149.0)
VLDL: 13 mg/dL (ref 0.0–40.0)

## 2015-05-01 LAB — CBC WITH DIFFERENTIAL/PLATELET
BASOS ABS: 0.1 10*3/uL (ref 0.0–0.1)
BASOS PCT: 1.2 % (ref 0.0–3.0)
EOS ABS: 0.5 10*3/uL (ref 0.0–0.7)
Eosinophils Relative: 7 % — ABNORMAL HIGH (ref 0.0–5.0)
HEMATOCRIT: 42.9 % (ref 39.0–52.0)
HEMOGLOBIN: 14 g/dL (ref 13.0–17.0)
LYMPHS PCT: 20.3 % (ref 12.0–46.0)
Lymphs Abs: 1.4 10*3/uL (ref 0.7–4.0)
MCHC: 32.6 g/dL (ref 30.0–36.0)
MCV: 92.8 fl (ref 78.0–100.0)
Monocytes Absolute: 0.7 10*3/uL (ref 0.1–1.0)
Monocytes Relative: 9.9 % (ref 3.0–12.0)
Neutro Abs: 4.2 10*3/uL (ref 1.4–7.7)
Neutrophils Relative %: 61.6 % (ref 43.0–77.0)
Platelets: 216 10*3/uL (ref 150.0–400.0)
RBC: 4.63 Mil/uL (ref 4.22–5.81)
RDW: 14.3 % (ref 11.5–15.5)
WBC: 6.9 10*3/uL (ref 4.0–10.5)

## 2015-05-01 LAB — TSH: TSH: 0.58 u[IU]/mL (ref 0.35–4.50)

## 2015-05-01 LAB — HEPATIC FUNCTION PANEL
ALT: 3 U/L (ref 0–53)
AST: 9 U/L (ref 0–37)
Albumin: 4.2 g/dL (ref 3.5–5.2)
Alkaline Phosphatase: 102 U/L (ref 39–117)
BILIRUBIN TOTAL: 2.7 mg/dL — AB (ref 0.2–1.2)
Bilirubin, Direct: 0.4 mg/dL — ABNORMAL HIGH (ref 0.0–0.3)
Total Protein: 6.3 g/dL (ref 6.0–8.3)

## 2015-05-01 NOTE — Patient Instructions (Signed)
Follow up in 3-4 months to recheck mood, weight We'll notify you of your lab results and make any changes if needed Make sure you are eating regularly!  If you are not hungry for meals, please make sure you have protein shakes Please call and schedule your colonoscopy w/ Dr Carlean Purl [phone] Please call and schedule some counseling by calling [phone] Focus on getting regular activity- walking, go to the Y, etc- this is good for both your physical and emotional well being Call with any questions or concerns If you want to join [place] at the new [place] office, any scheduled appointments will automatically transfer and we will see you at [address] [place] [address] [place], [place], [place] [address] ([place]) [place] in there!!!

## 2015-05-01 NOTE — Progress Notes (Signed)
Pre visit review using our clinic review tool, if applicable. No additional management support is needed unless otherwise documented below in the visit note. 

## 2015-05-01 NOTE — Progress Notes (Signed)
   Subjective:    Patient ID: [name], male    DOB: 03-24-1934, 82 y.o.   MRN: [id number]  HPI Here today for CPE.  Risk Factors: Hyperlipidemia- chronic problem, on Lipitor daily HTN- chronic problem, on Losartan daily Bladder Cancer- currently in tx w/ Dr Junious Silk Parkinson's- Following w/ Dr Tat Physical Activity: exercising regularly Fall Risk: elevated due to Parkinson's Depression: chronic problem, on Lexapro daily Hearing: decreased, has hearing aides but doesn't always wear them ADL's: independent Cognitive: memory and attention are intact, tangential thought process Home Safety: safe at home, lives w/ wife Height, Weight, BMI, Visual Acuity: see vitals, vision corrected to 20/20 w/ glasses Counseling: UTD on urology Junious Silk).  Due for colonoscopy Carlean Purl)- waiting until urology situation improves, 'i can only deal w/ so much at 1 time'.  Due for flu Care team reviewed and updated w/ pt Labs Ordered: See A&P Care Plan: See A&P    Review of Systems Patient reports no vision/hearing changes, fever ,adenopathy, persistant/recurrent hoarseness, chest pain, palpitations, edema, persistant/recurrent cough, hemoptysis, dyspnea (rest,exertional, paroxysmal nocturnal), gastrointestinal  bleeding (melena, rectal bleeding), abdominal pain, excessive heart burn, GU symptoms (dysuria, hematuria, voiding/incontinence issues) syncope, focal weakness, memory loss, numbness & tingling, skin/hair/nail changes, abnormal bruising/bleeding, musculoskeletal symptoms/signs.   + dysphagia w/ food, has had swallow eval done w/ Neuro    Objective:   Physical Exam General Appearance:    Alert, cooperative, no distress, appears stated age  Head:    Normocephalic, without obvious abnormality, atraumatic  Eyes:    PERRL, conjunctiva/corneas clear, EOM's intact, fundi    benign, both eyes       Ears:    Normal TM's and external ear canals, both ears  Nose:   Nares normal, septum midline, mucosa  normal, no drainage   or sinus tenderness  Throat:   Lips, mucosa, and tongue normal; teeth and gums normal  Neck:   Supple, symmetrical, trachea midline, no adenopathy;       thyroid:  No enlargement/tenderness/nodules  Back:     Symmetric, no curvature, ROM normal, no CVA tenderness  Lungs:     Clear to auscultation bilaterally, respirations unlabored  Chest wall:    No tenderness or deformity  Heart:    Regular rate and rhythm, S1 and S2 normal, no murmur, rub   or gallop  Abdomen:     Soft, non-tender, bowel sounds active all four quadrants,    no masses, no organomegaly  Genitalia:    Deferred to urology  Rectal:    Extremities:   Extremities normal, atraumatic, no cyanosis or edema  Pulses:   2+ and symmetric all extremities  Skin:   Skin color, texture, turgor normal, no rashes or lesions  Lymph nodes:   Cervical, supraclavicular, and axillary nodes normal  Neurologic:   CNII-XII intact. + parkinson's tremor          Assessment & Plan:

## 2015-05-19 NOTE — Assessment & Plan Note (Signed)
Chronic problem.  Adequate control.  Asymptomatic.  Check labs.  No anticipated med changes 

## 2015-05-19 NOTE — Assessment & Plan Note (Signed)
Chronic problem.  Following w/ Dr Carles Collet.  Pt remains depressed and withdrawn.  Encouraged him to resume counseling.  Schedule regular exercise to improve both his physical and emotional well being.  Pt is resistant to all ideas at this time.

## 2015-05-19 NOTE — Assessment & Plan Note (Signed)
Chronic problem.  Tolerating statin w/o difficulty.  Check labs.  Adjust meds prn  

## 2015-05-19 NOTE — Assessment & Plan Note (Signed)
Ongoing issue.  Following w/ Dr Junious Silk.  Will follow along.

## 2015-05-19 NOTE — Assessment & Plan Note (Signed)
[name] PE unchanged from previous.  Due for colonoscopy- pt prefers to wait until his urology situation is more clear before proceeding w/ colonoscopy.  Due for flu shot.  Check labs.  Anticipatory guidance provided.

## 2015-05-29 ENCOUNTER — Other Ambulatory Visit: Payer: Self-pay | Admitting: Neurology

## 2015-05-29 MED ORDER — SELEGILINE HCL 5 MG PO TABS: 5.0000 mg | ORAL_TABLET | Freq: Two times a day (BID) | ORAL | Status: DC

## 2015-05-29 NOTE — Telephone Encounter (Signed)
Selegiline refill requested. Per last office note- patient to remain on medication. Refill approved and sent to patient's pharmacy.   

## 2015-05-30 ENCOUNTER — Other Ambulatory Visit: Payer: Self-pay | Admitting: Family Medicine

## 2015-05-30 ENCOUNTER — Other Ambulatory Visit: Payer: Self-pay

## 2015-05-30 MED ORDER — CELECOXIB 200 MG PO CAPS: 200.0000 mg | ORAL_CAPSULE | Freq: Every day | ORAL | Status: DC

## 2015-05-30 NOTE — Telephone Encounter (Signed)
Medication filled to pharmacy as requested.   

## 2015-06-12 ENCOUNTER — Other Ambulatory Visit: Payer: Self-pay | Admitting: Family Medicine

## 2015-06-12 NOTE — Telephone Encounter (Signed)
Medication filled to pharmacy as requested.   

## 2015-07-04 ENCOUNTER — Ambulatory Visit (INDEPENDENT_AMBULATORY_CARE_PROVIDER_SITE_OTHER): Payer: Medicare Other | Admitting: Neurology

## 2015-07-04 ENCOUNTER — Encounter: Payer: Self-pay | Admitting: Neurology

## 2015-07-04 VITALS — BP 142/78 | HR 80 | Ht 70.5 in | Wt 170.0 lb

## 2015-07-04 DIAGNOSIS — F331 Major depressive disorder, recurrent, moderate: Secondary | ICD-10-CM | POA: Diagnosis not present

## 2015-07-04 DIAGNOSIS — G20 Parkinson's disease: Secondary | ICD-10-CM

## 2015-07-04 NOTE — Patient Instructions (Signed)
1.  Drop amantadine to once daily for 3-4 weeks and if doing okay, stop the amantadine 2.  Selegeline - take 2 tablets once a day instead of 1 tablet twice a day 3.  We will call you with an appt for Dr. Richrd Sox for memory testing

## 2015-07-04 NOTE — Progress Notes (Signed)
[name] was seen today in the movement disorders clinic for neurologic consultation at the request of Annye Asa, MD.  The consultation is for the evaluation of PD.  The pt is accompanied by his wife who supplements the hx.  The patient was previously seen by [name] and I did have the opportunity to review those records.  Patient is wishing to transfer care because of the length of travel to [facility].  The first symptom(s) the patient noticed was unilateral hand tremor of the L hand and this was approximately 2005.  Pt is L hand dominant.   Pt went to his PCP and he was referred to [facility] and was dx with PD.  I do not have those records. Pt believes that he was started on requip but doesn't think that it helped.  He thinks that he was on it for about a year and his wife believes that it caused sleep attacks.  He is not sure what the next medication was but the physician at [facility] left and he was then referred to [name] and he has been seeing him and his NP since.  The patient is currently on carbidopa/levodopa 50/200 CR 1-1/2 tablets at 8 AM/ and then one at noon/4pm/8pm along with a carbidopa/levodopa 25/100 in the afternoon and in the evening.  He admits that he often misses dosages.  He is on amantadine 100 mg 4 times a day and selegiline 5 mg twice a day.  01/02/14 update:  The patient returns today for followup.  Last visit, I discontinued the patient's extended release levodopa as he was on a strange combination of immediate release and extended release carbidopa/levodopa.  He is currently on carbidopa/levodopa 25/100 IR and takes 2 tablets at 8 AM/noon/4 PM/8 PM and then carbidopa/levodopa 50/200 at night.  Pt states that the first week after we changed, he felt very sleepy but he no longer feels that way.  I asked him to move his selegiline to 8 AM and noon because of reported insomnia.  The patient did have a modified barium swallow test done since last  visit.  That was normal.  Since our last visit, the patient did have a bladder neoplasm removed on 12/19/2013 and a stent was placed.  The patient has recovered well.  He still has quite a bit of hematuria though.  He does need the stent removed.  No falls.  Limited exercise.  Was referred to neurorehab since last visit but states that he chose not to go.  Going to see mental health counselor at [facility] and that is helping.  Remains on amantadine.  04/30/14 update:   He is currently on carbidopa/levodopa 25/100 IR and takes 2 tablets at 8 AM/noon/4 PM/8 PM and then carbidopa/levodopa 50/200 at night. He is still on selegeline 5 mg bid and amantadine 100 mg.  States that often times, he is taking the selegeline too late and then has trouble sleeping.  He on Lexapro for depression.  He has not had any falls since last visit.  Not formally exercising; "I know that it needs to be done but I cannot seem to get it in the routine."  No hallucinations.  Finds that he is sleeping a bit more during the day.  Did start vesicare and that may have affected balance.  It may have helped the bladder, however.     07/30/14 update:  He is currently on carbidopa/levodopa 25/100 IR and takes 2 tablets  at 8 AM/noon/4 PM/8 PM and then carbidopa/levodopa 50/200 at night.  He is also on selegiline twice per day.  He takes amantadine 100 mg tid.  I reviewed records made available to me since last visit.  The patient went to the emergency room on 06/14/2014.  He accidentally hit his head against the car door (it was a windy day and the car door shut on him) and sustained a laceration above the left forehead.  There was no loss of consciousness or alteration of consciousness.  Dermabond was placed and the patient was sent home from the emergency room.  He c/o dry mouth and thinks that it is due to medication.  He [facility] up [facility] for [facility].  He just signed up Saturday afternoon.  No lightheadness/near syncope.   No hallucinations.  Feels a little "dopey" in the AM.  Once he takes his medications, he feels less cognitively dull.  However, he also thinks his medications may contribute to cognitive dulling.  He also complains of decreased feeling in the first to third fingers (thumb, pointer and middle) bilaterally.  He has difficulty with grasp and dropping objects.  He notices it most when he is trying to text and has to use a stylus for that.  11/29/14 update:  Pt is seen today in f/u.  He remains on carbidopa/levodopa 25/100, 2 tablets at 8 AM/12 PM/4 PM/8 p.m. in addition to carbidopa/levodopa 50/200 at night.  He is on selegiline at 8 AM and noon and amantadine, 100 mg 3 times a day.  I reviewed notes from his primary care provider.  He saw her on 11/04/2014.  He was reporting increasing depression.  He was given numbers to contact for counseling.  He hasn't done that yet.  His wife got a promotion at work and he misses having her home. He remains on Lexapro. He has no SI/HI.  He reports that he has good and bad days but is overall the same as previous.  He reports that a few weeks ago he started to have the need for an afternoon nap.  He is only asleep for 15-20 minutes (he is unsure if the unscheduled nap happens before or after the second dose of selegeline).  He is having some difficulty with his voice.  He also c/o dry mouth despite biotene. He did have a fall since last visit in his garage; states that he lost balance and went to the left and got some superficial abrasions.  Admits that while he joined [employer], he is going very little.  He doesn't like going without his wife.  Notes more difficulty tying shoes on the right.  04/02/15 update:  The patient is seen in follow-up today, accompanied by his wife who supplements the history.  I have reviewed records since last visit.  He remains on carbidopa/levodopa 25/100, 2 tablets at 8 AM/12 PM/4 PM/8 PM in addition to carbidopa/levodopa 50/200 at night.  He is on  selegiline, 5 mg twice a day and amantadine, 100 mg 3 times per day.  He was complaining about dry mouth last time and I told him he could try and decrease the amantadine to see if that helped, and he did that.  He continues to c/o dry mouth.  He states that dry mouth is even worse.  He has other medication reasons for dry mouth as well, including the Vesicare.  He thinks that dropping the amantadine caused him to be more sleepy.  He did, however,  move his Lexapro to taking that to earlier in the day to see if it helped his anxiety.  He admits to increased anxiety and fears about Parkinson's disease symptoms getting worse.  He states that he has been losing weight for many months (weight has been stable here).  I asked him about depression and his wife definitely endorsed this although he initially denied.  He did undergo a cystoscopy on 12/14/2014.  Those records were reviewed.  A low grade recurrence of his bladder cancer was noted. The patient denies any falls since last visit but has had some near falls.  No visual hallucinations but has some auditory hallucinations (hears people speaking).  No lightheadedness or near syncope.  His swallowing has been good as long as he is careful.  He did attend therapy at the neurorehab center since our last visit.  He hasn't been exercising much since leaving therapy.  States that he has absolutely no time to exercise and becomes very defensive when asked about exercise.  He states that he refuses to do this.  He and his wife brought up concerns about driving and slowed reflexes.  07/04/15 update:  The patient is seen in follow-up today.  I have reviewed records since last visit.  He remains on carbidopa/levodopa 25/100, 2 tablets at 8 AM/12 PM/4 PM/8 PM in addition to carbidopa/levodopa 50/200 at night.  He is on selegiline, 5 mg twice a day and amantadine, 100 mg 3 times per day.  He was complaining about dry mouth last time and I told him he could try and decrease the  amantadine to see if that helped, but he really did not do that.  He has other medication reasons for dry mouth as well, including the Vesicare.  Last visit, I did increase his Lexapro to 20 mg daily and strongly encouraged him to go to counseling.  His primary care physician has encouraged this multiple times as well.  He has not done this.  He also has refused to participate with cardiovascular exercise. He is c/o EDS today.  Wife states sleeping more too.   I gave him a phone number to set up a driving evaluation last visit, but he did not follow through with that.  His wife states that they are still considering this but she hasn't noted any problems.    He has had 2 falls.  He had one on front porch - was painting the railing and fell back from first step.  With the other fall, he fell over some tools that were laying out and he fell on his back and hit his head.  No LOC.  No MS change since.      PREVIOUS MEDICATIONS: Sinemet, Sinemet CR, Requip and Eldpryl Requip (sleep attacks)  ALLERGIES:  No Known Allergies  CURRENT MEDICATIONS:  Current Outpatient Prescriptions on File Prior to Visit  Medication Sig Dispense Refill  . amantadine (SYMMETREL) 100 MG capsule Take 1 capsule (100 mg total) by mouth 3 (three) times daily. (Patient taking differently: Take 100 mg by mouth 2 (two) times daily. ) 90 capsule 5  . aspirin 81 MG tablet Take 2 tablets (162 mg total) by mouth daily. 30 tablet   . atorvastatin (LIPITOR) 40 MG tablet TAKE 1 TABLET (40 MG TOTAL) BY MOUTH DAILY. 90 tablet 1  . Ca Carbonate-Mag Hydroxide (ROLAIDS PO) Take by mouth as needed.    . carbidopa-levodopa (SINEMET CR) 50-200 MG tablet TAKE ONE TABLET BY MOUTH NIGHTLY AT BEDTIME 30 tablet  5  . carbidopa-levodopa (SINEMET IR) 25-100 MG tablet TAKE TWO TABLETS BY MOUTH FOUR TIMES DAILY 240 tablet 5  . celecoxib (CELEBREX) 200 MG capsule TAKE ONE CAPSULE EVERY DAY 30 capsule 6  . diclofenac sodium (VOLTAREN) 1 % GEL Apply topically as  needed.    Marland Kitchen escitalopram (LEXAPRO) 20 MG tablet Take 1 tablet (20 mg total) by mouth daily. 90 tablet 3  . fexofenadine (ALLEGRA) 180 MG tablet Take 180 mg by mouth daily.     . fluorouracil (EFUDEX) 5 % cream Apply topically 2 (two) times daily.    . fluticasone (FLONASE) 50 MCG/ACT nasal spray USE TWO SPRAYS IN EACH NOSTRIL DAILY 16 g 2  . ibuprofen (ADVIL,MOTRIN) 200 MG tablet Take 200 mg by mouth every 6 (six) hours as needed.    Marland Kitchen losartan (COZAAR) 100 MG tablet Take 1 tablet (100 mg total) by mouth daily. (Patient taking differently: Take 100 mg by mouth every evening. ) 30 tablet 6  . oxyCODONE-acetaminophen (PERCOCET) 5-325 MG per tablet Take 1 tablet by mouth every 4 (four) hours as needed. For back pain    . polyethylene glycol (MIRALAX / GLYCOLAX) packet Take 17 g by mouth daily as needed. For laxative    . Probiotic Product (PROBIOTIC DAILY PO) Take 1 capsule by mouth daily.    . selegiline (ELDEPRYL) 5 MG tablet Take 1 tablet (5 mg total) by mouth 2 (two) times daily with a meal. 60 tablet 5  . tamsulosin (FLOMAX) 0.4 MG CAPS capsule Take 0.4 mg by mouth daily after supper.     . VESICARE 10 MG tablet Take 10 mg by mouth every evening.   10   No current facility-administered medications on file prior to visit.    PAST MEDICAL HISTORY:   Past Medical History  Diagnosis Date  . Hyperlipidemia   . Hypertension   . Parkinson disease ([place]) DX   2005    NEUROLOGIST-   DR Cederic Mozley--  IDIOPATHIC PARKINSON'S /  TREMORS CONTROLLED WITH MEDS  . GERD (gastroesophageal reflux disease)   . Allergic rhinitis   . OAB (overactive bladder)   . Frequency of urination   . Urge urinary incontinence   . Nocturia   . BPH with elevated PSA   . Osteoarthritis     KNEES, SHOULDER  . DDD (degenerative disc disease), lumbar   . Hearing loss     does not wear his hearing aid  . Mild obstructive sleep apnea     PER PT  MILD OSA , NO CPAP RX,  STUDY DONE 2009  . Depression   . Bladder cancer [facility])       urologist-  dr eskridge--  low grade TA  . Dry mouth     USES BIOTIN SPRAY/ MOUTHWASH    PAST SURGICAL HISTORY:   Past Surgical History  Procedure Laterality Date  . Nasal sinus surgery  1982  . Inguinal hernia repair  03/09/2012    Procedure: HERNIA REPAIR INGUINAL ADULT;  Surgeon: Adin Hector, MD;  Location: [facility];  Service: General;  Laterality: Right;  . Insertion of mesh  03/09/2012    Procedure: INSERTION OF MESH;  Surgeon: Adin Hector, MD;  Location: [facility];  Service: General;  Laterality: Right;  . Removal benign cyst left forehead  1969  . Orif right arm fx  1949    HARDWARE REMOVED   . Cataract extraction w/ intraocular lens  implant, bilateral  2014  . Cystoscopy with stent placement  Left 12/19/2013    Procedure: CYSTOSCOPY BLADDER BX, LEFT URETERAL STENT PLACEMENT , LEFT RETROGRADE AND PYLOGRAM;  Surgeon: Festus Aloe, MD;  Location: [facility];  Service: Urology;  Laterality: Left;  . Transurethral resection of bladder tumor N/A 12/19/2013    Procedure:  TRANSURETHRAL RESECTION OF BLADDER TUMOR WITH GYRUS (TURBT-GYRUS);  Surgeon: Festus Aloe, MD;  Location: [facility];  Service: Urology;  Laterality: N/A;  . Cystoscopy w/ retrogrades Bilateral 12/14/2014    Procedure: CYSTOSCOPY BLADDER BIOPSY FULGERATION MITOMYCIN C BILATERAL RETROGRADE PYELOGRAM;  Surgeon: Festus Aloe, MD;  Location: [facility];  Service: Urology;  Laterality: Bilateral;    SOCIAL HISTORY:   Social History   Social History  . Marital Status: Married    Spouse Name: N/A  . Number of Children: N/A  . Years of Education: N/A   Occupational History  . Not on file.   Social History Main Topics  . Smoking status: Former Smoker -- 1.00 packs/day for 15 years    Types: Cigarettes    Quit date: 04/20/1968  . Smokeless tobacco: Never Used  . Alcohol Use: 4.2 oz/week    7 Glasses of wine per week     Comment: ONE WINE PER DAY  .  Drug Use: No  . Sexual Activity: Not on file   Other Topics Concern  . Not on file   Social History Narrative    FAMILY HISTORY:   Family Status  Relation Status Death Age  . Mother Deceased     alzeihmer's disease  . Father Deceased     liver cancer, colon cancer  . Brother Alive   . Brother Deceased     killed   . Daughter Alive     ROS:  A complete 10 system review of systems was obtained and was unremarkable apart from what is mentioned above.  PHYSICAL EXAMINATION:    VITALS:   Filed Vitals:   07/04/15 1110  BP: 142/78  Pulse: 80  Height: 5' 10.5" (1.791 m)  Weight: 170 lb (77.111 kg)   Wt Readings from Last 3 Encounters:  07/04/15 170 lb (77.111 kg)  05/01/15 176 lb (79.833 kg)  04/02/15 177 lb (80.287 kg)    GEN:  The patient appears stated age and is in NAD. HEENT:  Normocephalic, atraumatic.  The mucous membranes are moist. The superficial temporal arteries are without ropiness or tenderness. CV:  RRR Lungs:  CTAB Neck/HEME:  There are no carotid bruits bilaterally.  Neurological examination:  Orientation: The patient is alert and oriented x3. Cranial nerves: There is good facial symmetry.  There is mild facial hypomimia.   Extraocular muscles are intact. The visual fields are full to confrontational testing. The speech is fluent and clear.  The patient has some difficulty with the guttural sounds.  He is mildly hypophonic.  Soft palate rises symmetrically and there is no tongue deviation. Hearing is intact to conversational tone. Sensation: Sensation is intact to light touch throughout. Motor: Strength is 5/5 in the bilateral upper and lower extremities.   Shoulder shrug is equal and symmetric.  There is no pronator drift.   Movement examination: Tone: There is normal tone in the bilateral upper extremities.  The tone in the lower extremities is normal.  Abnormal movements: There is no dyskinesia today Coordination:  There is good rapid alternating  movements today. Gait and Station: The patient has no significant difficulty arising out of a deep-seated chair without the use of the hands.  The patient's stride length is slightly decreased.  There is no dyskinesia today  There is mild camptocormia to the L.  There is decreased arm swing on the right  ASSESSMENT/PLAN:  1.  Idiopathic Parkinson's disease  -He will continue taking carbidopa/levodopa 25/100 IR 2 tablets at 8 AM/noon/4 PM/8 PM and then take the 50/200 CR at bedtime.  -For now, the patient will remain on selegiline at 8 AM and noon.  He is getting up later so having trouble getting in both so can take both tablets together if he would like.     -decrease amantadine to once per day because of memory change for 1 month and if doing well, will stop the amantadine.  Told him that tremor and dyskinesia may increase but it would be okay trade off for improvement in memory  -I. encouraged again safe, cardiovascular exercise.  He just got a membership at the [facility] but isn't really going and encouraged him to do so.  Talked about this last visit as well. 2.  Dysphagia.  -MBE on 09/29/13 was normal and no problems currently 3.  Depression.  -He is on Lexapro 20 mg daily.  We talked about the interaction between Lexapro and selegiline.  -I encouraged him to attend counseling as did Dr. Birdie Riddle.  He refuses.  -Think this is a much bigger issue then he is letting on.  Think is very angry that his wife is working outside of the home most of the day.  -Think that some of the EDS is related to depression but when I brought this up again today, he states "are we going back to this."  He is very resistant to this.   4.  mild cognitive impairment.  -This is likely related to the Parkinson's, but I also think that depression contributes to memory change.  I do think that dementia could be emerging  -they have number to set up driving eval  -will set up neuropsych eval 5.  Urinary incontinence  -on  vesicare.  Thinks may have affected the balance but no falls and doing okay.  Thinks that may have made dry mouth worse. 6.  Dry mouth  -  Has repetitively complained about this.  Dropping down the amantadine did not help and will be stopping.  Vesicare may be part of the issue.     7.  I will plan on seeing him back in the next few months, sooner should new neurologic issues arise.  Much greater than 50% of this visit was spent in counseling with the patient and the family.  Total face to face time:  30 min

## 2015-07-10 ENCOUNTER — Other Ambulatory Visit: Payer: Self-pay | Admitting: Family Medicine

## 2015-07-11 NOTE — Telephone Encounter (Signed)
Medication filled to pharmacy as requested.   

## 2015-07-28 ENCOUNTER — Encounter: Payer: Self-pay | Admitting: Neurology

## 2015-07-31 ENCOUNTER — Ambulatory Visit: Payer: Self-pay | Admitting: Family Medicine

## 2015-08-05 ENCOUNTER — Other Ambulatory Visit: Payer: Self-pay | Admitting: Neurology

## 2015-08-05 NOTE — Telephone Encounter (Signed)
Amantadine refill requested. Per last office note- patient to remain on medication with a decrease to one tablet daily for one month and then potentially stop medication. Refill approved for once a day for one month and sent to patient's pharmacy.

## 2015-08-06 ENCOUNTER — Ambulatory Visit (INDEPENDENT_AMBULATORY_CARE_PROVIDER_SITE_OTHER): Payer: Medicare Other | Admitting: Psychology

## 2015-08-06 DIAGNOSIS — R413 Other amnesia: Secondary | ICD-10-CM

## 2015-08-06 DIAGNOSIS — G20 Parkinson's disease: Secondary | ICD-10-CM | POA: Diagnosis not present

## 2015-08-06 DIAGNOSIS — F330 Major depressive disorder, recurrent, mild: Secondary | ICD-10-CM
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
## 2015-08-07 ENCOUNTER — Ambulatory Visit: Payer: Self-pay | Admitting: Family Medicine

## 2015-08-13 DIAGNOSIS — D485 Neoplasm of uncertain behavior of skin: Secondary | ICD-10-CM | POA: Diagnosis not present

## 2015-08-13 DIAGNOSIS — L570 Actinic keratosis: Secondary | ICD-10-CM | POA: Diagnosis not present

## 2015-08-13 DIAGNOSIS — L821 Other seborrheic keratosis: Secondary | ICD-10-CM | POA: Diagnosis not present

## 2015-08-13 DIAGNOSIS — L720 Epidermal cyst: Secondary | ICD-10-CM | POA: Diagnosis not present

## 2015-08-17 ENCOUNTER — Encounter: Payer: Self-pay | Admitting: Family Medicine

## 2015-08-20 ENCOUNTER — Ambulatory Visit (INDEPENDENT_AMBULATORY_CARE_PROVIDER_SITE_OTHER): Payer: Medicare Other | Admitting: Psychology

## 2015-08-20 DIAGNOSIS — F411 Generalized anxiety disorder: Secondary | ICD-10-CM | POA: Diagnosis not present

## 2015-08-20 DIAGNOSIS — F329 Major depressive disorder, single episode, unspecified: Secondary | ICD-10-CM

## 2015-08-20 DIAGNOSIS — F32A Depression, unspecified: Secondary | ICD-10-CM

## 2015-08-20 DIAGNOSIS — F0280 Dementia in other diseases classified elsewhere without behavioral disturbance: Secondary | ICD-10-CM | POA: Diagnosis not present

## 2015-08-20 DIAGNOSIS — G20 Parkinson's disease: Secondary | ICD-10-CM

## 2015-08-20 DIAGNOSIS — R972 Elevated prostate specific antigen [PSA]: Secondary | ICD-10-CM | POA: Diagnosis not present

## 2015-08-20 NOTE — Progress Notes (Signed)
   Neuropsychology Feedback Appointment  [name] and his wife returned for a feedback appointment today to review the results of his recent neuropsychological evaluation with this provider. 60 minutes face-to-face time was spent reviewing his test results, my impressions and my recommendations as detailed in his report. Education was provided about cognitive deficits due to PD. The patient and his wife were given the opportunity to ask questions, and I did my best to answer these to their satisfaction. Written materials on cognitive and mood changes in PD were provided.   Total time spent on this patient's case: 90791x1 unit; 96118x6 units including medical record review, interpretation, report writing, and feedback.
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
## 2015-08-23 ENCOUNTER — Telehealth: Payer: Self-pay | Admitting: Family Medicine

## 2015-08-23 MED ORDER — MELOXICAM 7.5 MG PO TABS: 7.5000 mg | ORAL_TABLET | Freq: Every day | ORAL | Status: DC

## 2015-08-23 NOTE — Telephone Encounter (Signed)
Med filled, chart updated to reflect and pt wife advised

## 2015-08-23 NOTE — Telephone Encounter (Signed)
Ok for Meloxicam 7.5mg  daily, #30, 3 refills

## 2015-08-23 NOTE — Telephone Encounter (Signed)
Wife called in and states that celebrex is no longer covered with ins and asking if Dr Birdie Riddle could call in meloxicam, [facility] on [address]

## 2015-08-29 ENCOUNTER — Telehealth: Payer: Self-pay | Admitting: Neurology

## 2015-08-29 ENCOUNTER — Ambulatory Visit: Payer: Medicare Other | Admitting: Family Medicine

## 2015-08-29 DIAGNOSIS — G20 Parkinson's disease: Secondary | ICD-10-CM

## 2015-08-29 DIAGNOSIS — R972 Elevated prostate specific antigen [PSA]: Secondary | ICD-10-CM | POA: Diagnosis not present

## 2015-08-29 DIAGNOSIS — Z0000 Encounter for general adult medical examination without abnormal findings: Secondary | ICD-10-CM | POA: Diagnosis not present

## 2015-08-29 DIAGNOSIS — C670 Malignant neoplasm of trigone of bladder: Secondary | ICD-10-CM | POA: Diagnosis not present

## 2015-08-29 NOTE — Telephone Encounter (Signed)
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  Pt was in agreement with this plan.  If you are in agreement, please send updated order for OT and PT via epic.  Thank you, Mady Haagensen, PT

## 2015-08-29 NOTE — Telephone Encounter (Signed)
Order entered

## 2015-09-04 ENCOUNTER — Ambulatory Visit: Payer: Medicare Other | Admitting: Family Medicine

## 2015-09-05 ENCOUNTER — Ambulatory Visit: Payer: Medicare Other

## 2015-09-05 ENCOUNTER — Telehealth: Payer: Self-pay | Admitting: Neurology

## 2015-09-05 ENCOUNTER — Ambulatory Visit: Payer: Medicare Other | Admitting: Occupational Therapy

## 2015-09-05 ENCOUNTER — Ambulatory Visit: Payer: Medicare Other | Attending: Neurology | Admitting: Physical Therapy

## 2015-09-05 DIAGNOSIS — R2689 Other abnormalities of gait and mobility: Secondary | ICD-10-CM | POA: Insufficient documentation

## 2015-09-05 NOTE — Therapy (Signed)
[facility] [address] [place], [place], [address] Phone: [phone]   Fax:  [phone]  Physical Therapy Treatment  Patient Details  Name: [name] MRN: [id number] Date of Birth: 10/09/1933 No Data Recorded  Encounter Date: 09/05/2015    Past Medical History  Diagnosis Date  . Hyperlipidemia   . Hypertension   . Parkinson disease ([place]) DX   2005    NEUROLOGIST-   DR TAT--  IDIOPATHIC PARKINSON'S /  TREMORS CONTROLLED WITH MEDS  . GERD (gastroesophageal reflux disease)   . Allergic rhinitis   . OAB (overactive bladder)   . Frequency of urination   . Urge urinary incontinence   . Nocturia   . BPH with elevated PSA   . Osteoarthritis     KNEES, SHOULDER  . DDD (degenerative disc disease), lumbar   . Hearing loss     does not wear his hearing aid  . Mild obstructive sleep apnea     PER PT  MILD OSA , NO CPAP RX,  STUDY DONE 2009  . Depression   . Bladder cancer [facility])     urologist-  dr eskridge--  low grade TA  . Dry mouth     USES BIOTIN SPRAY/ MOUTHWASH    Past Surgical History  Procedure Laterality Date  . Nasal sinus surgery  1982  . Inguinal hernia repair  03/09/2012    Procedure: HERNIA REPAIR INGUINAL ADULT;  Surgeon: Adin Hector, MD;  Location: [facility];  Service: General;  Laterality: Right;  . Insertion of mesh  03/09/2012    Procedure: INSERTION OF MESH;  Surgeon: Adin Hector, MD;  Location: [facility];  Service: General;  Laterality: Right;  . Removal benign cyst left forehead  1969  . Orif right arm fx  1949    HARDWARE REMOVED   . Cataract extraction w/ intraocular lens  implant, bilateral  2014  . Cystoscopy with stent placement Left 12/19/2013    Procedure: CYSTOSCOPY BLADDER BX, LEFT URETERAL STENT PLACEMENT , LEFT RETROGRADE AND PYLOGRAM;  Surgeon: Festus Aloe, MD;  Location[facility];  Service: Urology;  Laterality: Left;  . Transurethral resection of  bladder tumor N/A 12/19/2013    Procedure:  TRANSURETHRAL RESECTION OF BLADDER TUMOR WITH GYRUS (TURBT-GYRUS);  Surgeon: Festus Aloe, MD;  Location: [facility];  Service: Urology;  Laterality: N/A;  . Cystoscopy w/ retrogrades Bilateral 12/14/2014    Procedure: CYSTOSCOPY BLADDER BIOPSY FULGERATION MITOMYCIN C BILATERAL RETROGRADE PYELOGRAM;  Surgeon: Festus Aloe, MD;  Location: [facility];  Service: Urology;  Laterality: Bilateral;    There were no vitals filed for this visit.      Subjective Assessment - 09/05/15 1323    Subjective Pt is a 82 year old male who presents to OPPT for return PT/PD eval.  Feel tired a lot, fatigue varies from day to day.  Pt does not report falls, but per MD note, pt has had 2 falls in the recent months.  Pt reports he feels off-balance "a lot of times".  He does not use assistive device.   Patient Stated Goals I don't think physical therapy would make a difference-especially going forward beyond today.       Physical therapy evaluation initiated today.  Upon subjective discussion during eval, pt reports that he feels physical therapy would not make a difference.  This PT discussed pt's recent documented falls (from MD note) and his mention that balance is off.  He feels that physical therapy would only give "baby steps" in this area, where he needs to take "giant steps".  Again, this therapist discussed benefits of physical therapy in assessing fall risk and addressing areas needed to improve balance.  Pt declines further therapy beyond today, and physical therapy evaluation not completed.  In addition, pt declines scheduled OT and speech therapy evaluations today.  PT advised patient that if significant balance and mobility changes occur, contact physician for new orders for therapy.                                    Patient will benefit from skilled therapeutic intervention in order to  improve the following deficits and impairments:     Visit Diagnosis: Other abnormalities of gait and mobility     Problem List Patient Active Problem List   Diagnosis Date Noted  . Bladder cancer ([place]) 04/26/2014  . Right inguinal hernia 09/10/2011  . Direct inguinal hernia 08/07/2011  . Urinary frequency 03/18/2011  . Skin lesion 03/18/2011  . General medical examination 03/18/2011  . Cerumen impaction 12/04/2010  . Nail fungus 10/14/2010  . Fatigue 10/14/2010  . RHINITIS 02/12/2010  . GERD 12/03/2009  . Depression 08/28/2009  . WEIGHT LOSS 08/28/2009  . PSA, INCREASED 06/11/2009  . BACK PAIN 01/09/2009  . NEOPLASM OF UNCERTAIN BEHAVIOR OF SKIN 07/05/2008  . PARKINSON'S DISEASE 05/13/2007  . Hyperlipidemia 02/28/2007  . ERECTILE DYSFUNCTION 02/28/2007  . Essential hypertension 02/28/2007  . OSTEOARTHRITIS 02/28/2007    Belisa Eichholz W. 09/05/2015, 2:16 PM Frazier Butt., PT [facility] [address] [place], [place], [address] Phone: [phone]   Fax:  [phone]  Name: [name] MRN: [id number] Date of Birth: April 16, 1934

## 2015-09-05 NOTE — Telephone Encounter (Signed)
Let pt/spouse know that I was disappointed to see that he declined physical therapy.  Unfortunately, there are only so many things that meds help with and balance isn't one of them.  Only PT can help this.  Would urge him to reconsider

## 2015-09-06 NOTE — Telephone Encounter (Signed)
Patient made aware. He states he went to three sessions - told them he didn't feel a difference. Encouraged him to continue PT.

## 2015-09-12 ENCOUNTER — Encounter: Payer: Self-pay | Admitting: Family Medicine

## 2015-09-12 ENCOUNTER — Ambulatory Visit (INDEPENDENT_AMBULATORY_CARE_PROVIDER_SITE_OTHER): Payer: Medicare Other | Admitting: Family Medicine

## 2015-09-12 VITALS — BP 116/80 | HR 70 | Temp 98.0°F | Resp 17 | Ht 71.0 in | Wt 172.2 lb

## 2015-09-12 DIAGNOSIS — F329 Major depressive disorder, single episode, unspecified: Secondary | ICD-10-CM | POA: Diagnosis not present

## 2015-09-12 DIAGNOSIS — G20 Parkinson's disease: Secondary | ICD-10-CM

## 2015-09-12 DIAGNOSIS — F32A Depression, unspecified: Secondary | ICD-10-CM

## 2015-09-12 NOTE — Progress Notes (Signed)
Pre visit review using our clinic review tool, if applicable. No additional management support is needed unless otherwise documented below in the visit note. 

## 2015-09-12 NOTE — Patient Instructions (Signed)
Please schedule an appt w/ a new provider.  Since you are interested in a male physician, I would recommend investigating either our [place] or [place] offices I will notify Dr Tat that I am no longer your physician Please schedule an on-road driving assessment I wish you the best of luck

## 2015-09-12 NOTE — Progress Notes (Signed)
   Subjective:    Patient ID: [name], male    DOB: 18-Oct-1933, 82 y.o.   MRN: [id number]  HPI Weight loss- pt's weight is up 2 lbs from last visit w/ Dr Carles Collet in March.  Pt reports eating habits are unchanged.  Depression- chronic problem for pt.  'every day is different'.  Currently on Lexapro 20mg  daily.  Pt reports 'i feel a little bit alone'.  + body aches.  Wife works, 'i'm alone most of the time'.  Pt finds himself napping in the afternoon if watching TV or reading.  'i don't have an immediate grasp on the day, the date, the day of the week'.  Neuro has strongly encouraged him to seek counseling, as have I, and he has declined.    Driving ability- Neuropsych recommended an on-road driving assessment due to visual-spatial deficits and cognitive deficits as they have concerns about his driving ability.  When I mentioned this, pt became very agitated.  Told me that I was 'difficult to talk to'.  Stated that 'we're not on the same planet'.  He argued that he has not had an accident and didn't understand why anyone would have concerns about his driving.  Stated that 'you're not in the car with me- how would you know?'  I told him that was the purpose of an on road test.  He said I was ageist, sexist, and he needs a 'good doctor'.  At that point, I ended our visit.  Review of Systems For ROS see HPI     Objective:   Physical Exam  Constitutional: He is oriented to person, place, and time. He appears well-developed and well-nourished. No distress.  HENT:  Head: Normocephalic and atraumatic.  Neurological: He is alert and oriented to person, place, and time.  + parkinsonian tremor  Skin: Skin is warm and dry.  Psychiatric:  Agitated, angry Tangential thought process  Vitals reviewed.         Assessment & Plan:

## 2015-09-18 ENCOUNTER — Telehealth: Payer: Self-pay | Admitting: Neurology

## 2015-09-18 NOTE — Telephone Encounter (Signed)
I did not call patient.

## 2015-09-18 NOTE — Telephone Encounter (Signed)
PT wife [name] called and said she was returning your call/Dawn CB# [phone]

## 2015-10-04 NOTE — Assessment & Plan Note (Signed)
Re-iterated the findings of neuropsych that indicated he is not able to drive unless he passes an on road evaluation.  At this, pt became angry, defensive, insulting and resorted to name calling.  I told him this is what the report found and I am only discussing this in the interest of his safety and the safety of those around him.  He refuses to accept this and told me that we no longer have a doctor-patient relationship.  I told him I was in agreement and ended the appt after 30+ minutes of discussion.

## 2015-10-12 ENCOUNTER — Other Ambulatory Visit: Payer: Self-pay | Admitting: Neurology

## 2015-10-14 NOTE — Telephone Encounter (Signed)
Carbidopa Levodopa refill requested. Per last office note- patient to remain on medication. Refill approved and sent to patient's pharmacy.   

## 2015-10-18 ENCOUNTER — Telehealth: Payer: Self-pay | Admitting: Family Medicine

## 2015-10-18 NOTE — Telephone Encounter (Signed)
Patient request to transfer to Dr. Larose Kells from Dr Birdie Riddle.

## 2015-10-18 NOTE — Telephone Encounter (Signed)
Per PCP that is ok with her.

## 2015-10-18 NOTE — Telephone Encounter (Signed)
Okay with me, schedule a visit to establish care

## 2015-10-23 NOTE — Telephone Encounter (Signed)
Pt has been scheduled.  °

## 2015-10-23 NOTE — Telephone Encounter (Signed)
Left message at [phone] for patient to call the office and schedule appointment to establish care with Dr. Larose Kells

## 2015-11-07 ENCOUNTER — Ambulatory Visit (INDEPENDENT_AMBULATORY_CARE_PROVIDER_SITE_OTHER): Payer: Medicare Other | Admitting: Neurology

## 2015-11-07 ENCOUNTER — Encounter: Payer: Self-pay | Admitting: Neurology

## 2015-11-07 VITALS — BP 124/60 | HR 86 | Ht 70.5 in | Wt 171.0 lb

## 2015-11-07 DIAGNOSIS — G20 Parkinson's disease: Secondary | ICD-10-CM | POA: Diagnosis not present

## 2015-11-07 DIAGNOSIS — F0280 Dementia in other diseases classified elsewhere without behavioral disturbance: Secondary | ICD-10-CM | POA: Diagnosis not present

## 2015-11-07 DIAGNOSIS — F331 Major depressive disorder, recurrent, moderate: Secondary | ICD-10-CM | POA: Diagnosis not present

## 2015-11-07 DIAGNOSIS — G20A1 Parkinson's disease without dyskinesia, without mention of fluctuations: Secondary | ICD-10-CM | POA: Insufficient documentation

## 2015-11-07 NOTE — Progress Notes (Signed)
[name] was seen today in the movement disorders clinic for neurologic consultation at the request of No primary care provider on file..  The consultation is for the evaluation of PD.  The pt is accompanied by his wife who supplements the hx.  The patient was previously seen by [name] and I did have the opportunity to review those records.  Patient is wishing to transfer care because of the length of travel to [facility].  The first symptom(s) the patient noticed was unilateral hand tremor of the L hand and this was approximately 2005.  Pt is L hand dominant.   Pt went to his PCP and he was referred to [facility] and was dx with PD.  I do not have those records. Pt believes that he was started on requip but doesn't think that it helped.  He thinks that he was on it for about a year and his wife believes that it caused sleep attacks.  He is not sure what the next medication was but the physician at [facility] left and he was then referred to [name] and he has been seeing him and his NP since.  The patient is currently on carbidopa/levodopa 50/200 CR 1-1/2 tablets at 8 AM/ and then one at noon/4pm/8pm along with a carbidopa/levodopa 25/100 in the afternoon and in the evening.  He admits that he often misses dosages.  He is on amantadine 100 mg 4 times a day and selegiline 5 mg twice a day.  01/02/14 update:  The patient returns today for followup.  Last visit, I discontinued the patient's extended release levodopa as he was on a strange combination of immediate release and extended release carbidopa/levodopa.  He is currently on carbidopa/levodopa 25/100 IR and takes 2 tablets at 8 AM/noon/4 PM/8 PM and then carbidopa/levodopa 50/200 at night.  Pt states that the first week after we changed, he felt very sleepy but he no longer feels that way.  I asked him to move his selegiline to 8 AM and noon because of reported insomnia.  The patient did have a modified barium swallow test  done since last visit.  That was normal.  Since our last visit, the patient did have a bladder neoplasm removed on 12/19/2013 and a stent was placed.  The patient has recovered well.  He still has quite a bit of hematuria though.  He does need the stent removed.  No falls.  Limited exercise.  Was referred to neurorehab since last visit but states that he chose not to go.  Going to see mental health counselor at [facility] and that is helping.  Remains on amantadine.  04/30/14 update:   He is currently on carbidopa/levodopa 25/100 IR and takes 2 tablets at 8 AM/noon/4 PM/8 PM and then carbidopa/levodopa 50/200 at night. He is still on selegeline 5 mg bid and amantadine 100 mg.  States that often times, he is taking the selegeline too late and then has trouble sleeping.  He on Lexapro for depression.  He has not had any falls since last visit.  Not formally exercising; "I know that it needs to be done but I cannot seem to get it in the routine."  No hallucinations.  Finds that he is sleeping a bit more during the day.  Did start vesicare and that may have affected balance.  It may have helped the bladder, however.     07/30/14 update:  He is currently on carbidopa/levodopa 25/100 IR  and takes 2 tablets at 8 AM/noon/4 PM/8 PM and then carbidopa/levodopa 50/200 at night.  He is also on selegiline twice per day.  He takes amantadine 100 mg tid.  I reviewed records made available to me since last visit.  The patient went to the emergency room on 06/14/2014.  He accidentally hit his head against the car door (it was a windy day and the car door shut on him) and sustained a laceration above the left forehead.  There was no loss of consciousness or alteration of consciousness.  Dermabond was placed and the patient was sent home from the emergency room.  He c/o dry mouth and thinks that it is due to medication.  He signed up at the [facility] for exercise.  He just signed up Saturday afternoon.  No  lightheadness/near syncope.  No hallucinations.  Feels a little "dopey" in the AM.  Once he takes his medications, he feels less cognitively dull.  However, he also thinks his medications may contribute to cognitive dulling.  He also complains of decreased feeling in the first to third fingers (thumb, pointer and middle) bilaterally.  He has difficulty with grasp and dropping objects.  He notices it most when he is trying to text and has to use a stylus for that.  11/29/14 update:  Pt is seen today in f/u.  He remains on carbidopa/levodopa 25/100, 2 tablets at 8 AM/12 PM/4 PM/8 p.m. in addition to carbidopa/levodopa 50/200 at night.  He is on selegiline at 8 AM and noon and amantadine, 100 mg 3 times a day.  I reviewed notes from his primary care provider.  He saw her on 11/04/2014.  He was reporting increasing depression.  He was given numbers to contact for counseling.  He hasn't done that yet.  His wife got a promotion at work and he misses having her home. He remains on Lexapro. He has no SI/HI.  He reports that he has good and bad days but is overall the same as previous.  He reports that a few weeks ago he started to have the need for an afternoon nap.  He is only asleep for 15-20 minutes (he is unsure if the unscheduled nap happens before or after the second dose of selegeline).  He is having some difficulty with his voice.  He also c/o dry mouth despite biotene. He did have a fall since last visit in his garage; states that he lost balance and went to the left and got some superficial abrasions.  Admits that while he joined [employer], he is going very little.  He doesn't like going without his wife.  Notes more difficulty tying shoes on the right.  04/02/15 update:  The patient is seen in follow-up today, accompanied by his wife who supplements the history.  I have reviewed records since last visit.  He remains on carbidopa/levodopa 25/100, 2 tablets at 8 AM/12 PM/4 PM/8 PM in addition to  carbidopa/levodopa 50/200 at night.  He is on selegiline, 5 mg twice a day and amantadine, 100 mg 3 times per day.  He was complaining about dry mouth last time and I told him he could try and decrease the amantadine to see if that helped, and he did that.  He continues to c/o dry mouth.  He states that dry mouth is even worse.  He has other medication reasons for dry mouth as well, including the Vesicare.  He thinks that dropping the amantadine caused him to be more sleepy.  He did, however, move his Lexapro to taking that to earlier in the day to see if it helped his anxiety.  He admits to increased anxiety and fears about Parkinson's disease symptoms getting worse.  He states that he has been losing weight for many months (weight has been stable here).  I asked him about depression and his wife definitely endorsed this although he initially denied.  He did undergo a cystoscopy on 12/14/2014.  Those records were reviewed.  A low grade recurrence of his bladder cancer was noted. The patient denies any falls since last visit but has had some near falls.  No visual hallucinations but has some auditory hallucinations (hears people speaking).  No lightheadedness or near syncope.  His swallowing has been good as long as he is careful.  He did attend therapy at the neurorehab center since our last visit.  He hasn't been exercising much since leaving therapy.  States that he has absolutely no time to exercise and becomes very defensive when asked about exercise.  He states that he refuses to do this.  He and his wife brought up concerns about driving and slowed reflexes.  07/04/15 update:  The patient is seen in follow-up today.  I have reviewed records since last visit.  He remains on carbidopa/levodopa 25/100, 2 tablets at 8 AM/12 PM/4 PM/8 PM in addition to carbidopa/levodopa 50/200 at night.  He is on selegiline, 5 mg twice a day and amantadine, 100 mg 3 times per day.  He was complaining about dry mouth last time  and I told him he could try and decrease the amantadine to see if that helped, but he really did not do that.  He has other medication reasons for dry mouth as well, including the Vesicare.  Last visit, I did increase his Lexapro to 20 mg daily and strongly encouraged him to go to counseling.  His primary care physician has encouraged this multiple times as well.  He has not done this.  He also has refused to participate with cardiovascular exercise. He is c/o EDS today.  Wife states sleeping more too.   I gave him a phone number to set up a driving evaluation last visit, but he did not follow through with that.  His wife states that they are still considering this but she hasn't noted any problems.    He has had 2 falls.  He had one on front porch - was painting the railing and fell back from first step.  With the other fall, he fell over some tools that were laying out and he fell on his back and hit his head.  No LOC.  No MS change since.     11/07/15 update:  The patient is seen in follow-up today, accompanied by wife and daughter who supplement the history.  I have reviewed records since last visit.  He remains on carbidopa/levodopa 25/100, 2 tablets at 8 AM/12 PM/4 PM/8 PM in addition to carbidopa/levodopa 50/200 at night.  He is on selegiline, 5 mg twice a day and amantadine, 100 mg 3 times per day.  I asked him to go ahead and wean off of the amantadine last visit because of cognitive decline and he did that.  He underwent neuropsych testing on 08/06/2015.  This demonstrated mild dementia, major depressive disorder, generalized anxiety disorder.  He has adamantly refused counseling.  He also has refused to participate with cardiovascular exercise in the past but his wife states that they did purchase a total  gym weight machine and he is using that now.  In the past, he has been given the number for the on road driving test several times and refuses to set up that evaluation, but also refuses to follow our  recommendations that if he does not want to do the on road test, then he should not be driving.  The neuropsych test suggested significant concerns in the area of his driving.  He comes today with a form from the [facility] and his license was suspended until it was completed.  They brought a SCAT application today as well.  He canceled his physical therapy since our last visit.  There was a fall going up the garage stairs but he didn't get hurt.  This was the only fall.  No lightheadedness or near syncope.  He has 1 hour nap per day.     PREVIOUS MEDICATIONS: Sinemet, Sinemet CR, Requip and Eldpryl Requip (sleep attacks)  ALLERGIES:  No Known Allergies  CURRENT MEDICATIONS:  Current Outpatient Prescriptions on File Prior to Visit  Medication Sig Dispense Refill  . aspirin 81 MG tablet Take 2 tablets (162 mg total) by mouth daily. 30 tablet   . atorvastatin (LIPITOR) 40 MG tablet TAKE 1 TABLET (40 MG TOTAL) BY MOUTH DAILY. 90 tablet 1  . Ca Carbonate-Mag Hydroxide (ROLAIDS PO) Take by mouth as needed.    . carbidopa-levodopa (SINEMET CR) 50-200 MG tablet TAKE ONE TABLET BY MOUTH NIGHTLY AT BEDTIME 30 tablet 5  . carbidopa-levodopa (SINEMET IR) 25-100 MG tablet TAKE TWO TABLETS BY MOUTH FOUR TIMES DAILY 240 tablet 0  . diclofenac sodium (VOLTAREN) 1 % GEL Apply topically as needed.    Marland Kitchen escitalopram (LEXAPRO) 20 MG tablet Take 1 tablet (20 mg total) by mouth daily. 90 tablet 3  . fexofenadine (ALLEGRA) 180 MG tablet Take 180 mg by mouth daily.     . fluorouracil (EFUDEX) 5 % cream Apply topically 2 (two) times daily.    . fluticasone (FLONASE) 50 MCG/ACT nasal spray USE TWO SPRAYS IN EACH NOSTRIL DAILY 16 g 2  . ibuprofen (ADVIL,MOTRIN) 200 MG tablet Take 200 mg by mouth every 6 (six) hours as needed.    Marland Kitchen losartan (COZAAR) 100 MG tablet TAKE 1 TABLET (100 MG TOTAL) BY MOUTH DAILY. 30 tablet 6  . oxyCODONE-acetaminophen (PERCOCET) 5-325 MG per tablet Take 1 tablet by mouth every 4 (four) hours as  needed. For back pain    . polyethylene glycol (MIRALAX / GLYCOLAX) packet Take 17 g by mouth daily as needed. For laxative    . Probiotic Product (PROBIOTIC DAILY PO) Take 1 capsule by mouth daily.    . selegiline (ELDEPRYL) 5 MG tablet Take 1 tablet (5 mg total) by mouth 2 (two) times daily with a meal. 60 tablet 5  . tamsulosin (FLOMAX) 0.4 MG CAPS capsule Take 0.4 mg by mouth daily after supper.     . VESICARE 10 MG tablet Take 10 mg by mouth every evening.   10   No current facility-administered medications on file prior to visit.    PAST MEDICAL HISTORY:   Past Medical History  Diagnosis Date  . Hyperlipidemia   . Hypertension   . Parkinson disease ([place]) DX   2005    NEUROLOGIST-   DR Cherly Erno--  IDIOPATHIC PARKINSON'S /  TREMORS CONTROLLED WITH MEDS  . GERD (gastroesophageal reflux disease)   . Allergic rhinitis   . OAB (overactive bladder)   . Frequency of urination   . Urge  urinary incontinence   . Nocturia   . BPH with elevated PSA   . Osteoarthritis     KNEES, SHOULDER  . DDD (degenerative disc disease), lumbar   . Hearing loss     does not wear his hearing aid  . Mild obstructive sleep apnea     PER PT  MILD OSA , NO CPAP RX,  STUDY DONE 2009  . Depression   . Bladder cancer [facility])     urologist-  dr eskridge--  low grade TA  . Dry mouth     USES BIOTIN SPRAY/ MOUTHWASH    PAST SURGICAL HISTORY:   Past Surgical History  Procedure Laterality Date  . Nasal sinus surgery  1982  . Inguinal hernia repair  03/09/2012    Procedure: HERNIA REPAIR INGUINAL ADULT;  Surgeon: Adin Hector, MD;  Location: [facility];  Service: General;  Laterality: Right;  . Insertion of mesh  03/09/2012    Procedure: INSERTION OF MESH;  Surgeon: Adin Hector, MD;  Location: [facility];  Service: General;  Laterality: Right;  . Removal benign cyst left forehead  1969  . Orif right arm fx  1949    HARDWARE REMOVED   . Cataract extraction w/ intraocular lens  implant, bilateral  2014  .  Cystoscopy with stent placement Left 12/19/2013    Procedure: CYSTOSCOPY BLADDER BX, LEFT URETERAL STENT PLACEMENT , LEFT RETROGRADE AND PYLOGRAM;  Surgeon: Festus Aloe, MD;  Location: [facility];  Service: Urology;  Laterality: Left;  . Transurethral resection of bladder tumor N/A 12/19/2013    Procedure:  TRANSURETHRAL RESECTION OF BLADDER TUMOR WITH GYRUS (TURBT-GYRUS);  Surgeon: Festus Aloe, MD;  Location[facility];  Service: Urology;  Laterality: N/A;  . Cystoscopy w/ retrogrades Bilateral 12/14/2014    Procedure: CYSTOSCOPY BLADDER BIOPSY FULGERATION MITOMYCIN C BILATERAL RETROGRADE PYELOGRAM;  Surgeon: Festus Aloe, MD;  Location: [facility];  Service: Urology;  Laterality: Bilateral;    SOCIAL HISTORY:   Social History   Social History  . Marital Status: Married    Spouse Name: N/A  . Number of Children: N/A  . Years of Education: N/A   Occupational History  . Not on file.   Social History Main Topics  . Smoking status: Former Smoker -- 1.00 packs/day for 15 years    Types: Cigarettes    Quit date: 04/20/1968  . Smokeless tobacco: Never Used  . Alcohol Use: 4.2 oz/week    7 Glasses of wine per week     Comment: ONE WINE PER DAY  . Drug Use: No  . Sexual Activity: Not on file   Other Topics Concern  . Not on file   Social History Narrative    FAMILY HISTORY:   Family Status  Relation Status Death Age  . Mother Deceased     alzeihmer's disease  . Father Deceased     liver cancer, colon cancer  . Brother Alive   . Brother Deceased     killed   . Daughter Alive     ROS:  A complete 10 system review of systems was obtained and was unremarkable apart from what is mentioned above.  PHYSICAL EXAMINATION:    VITALS:   Filed Vitals:   11/07/15 1054  BP: 124/60  Pulse: 86  Height: 5' 10.5" (1.791 m)  Weight: 171 lb (77.565 kg)   Wt Readings from Last 3 Encounters:  11/07/15 171 lb (77.565 kg)    09/12/15 172 lb  4 oz (78.132 kg)  07/04/15 170 lb (77.111 kg)    GEN:  The patient appears stated age and is in NAD. HEENT:  Normocephalic, atraumatic.  The mucous membranes are moist. The superficial temporal arteries are without ropiness or tenderness. CV:  RRR Lungs:  CTAB Neck/HEME:  There are no carotid bruits bilaterally.  Neurological examination:  Orientation: The patient is alert and oriented x3. Cranial nerves: There is good facial symmetry.  There is mild facial hypomimia.   Extraocular muscles are intact. The visual fields are full to confrontational testing. The speech is fluent and clear.  The patient has some difficulty with the guttural sounds.  He is mildly hypophonic.  Soft palate rises symmetrically and there is no tongue deviation. Hearing is intact to conversational tone. Sensation: Sensation is intact to light touch throughout. Motor: Strength is 5/5 in the bilateral upper and lower extremities.   Shoulder shrug is equal and symmetric.  There is no pronator drift.   Movement examination: Tone: There is normal tone in the bilateral upper extremities.  The tone in the lower extremities is normal.  Abnormal movements: There is no dyskinesia today Coordination:  There is good rapid alternating movements today. Gait and Station: The patient has mild significant difficulty arising out of a deep-seated chair without the use of the hands. The patient's stride length is good today.  There is no dyskinesia today  There is mild camptocormia to the L.  There is decreased arm swing on the right  ASSESSMENT/PLAN:  1.  Idiopathic Parkinson's disease  -He will continue taking carbidopa/levodopa 25/100 IR 2 tablets at 8 AM/noon/4 PM/8 PM and then take the 50/200 CR at bedtime.  -For now, the patient will remain on selegiline at 8 AM and noon.  He is getting up later so having trouble getting in both so can take both tablets together if he would like.    2.  Dysphagia.  -MBE on  09/29/13 was normal and no problems currently 3.  Depression, confirmed by neuropsych testing  -He is on Lexapro 20 mg daily.  We talked about the interaction between Lexapro and selegiline.  -would benefit from CBT but doesn't wish to go 4.  PDD  -He underwent neuropsych testing on 08/06/2015.  This confirmed mild dementia.  He was reported to [facility] by someone and I will fill out forms that they gave me today as well as SCAT forms.   5.  Urinary incontinence  -talked to him again about the vesicare regarding balance and cognition.  Talked to him about myrbetriq but he thinks that he tried it in the past and had SE.  Advised to discuss with urologist. 6.  Dry mouth  -  Has repetitively complained about this.  Dropping down the amantadine did not help and will be stopping.  Vesicare may be part of the issue.     7.  I will plan on seeing him back in the next few months, sooner should new neurologic issues arise.  Much greater than 50% of this visit was spent in counseling with the patient and the family.   This was first visit his daughter was present. Total face to face time:  30 min

## 2015-11-08 ENCOUNTER — Other Ambulatory Visit: Payer: Self-pay | Admitting: Neurology

## 2015-11-08 MED ORDER — CARBIDOPA-LEVODOPA ER 50-200 MG PO TBCR: 1.0000 | EXTENDED_RELEASE_TABLET | Freq: Every day | ORAL | Status: DC

## 2015-11-08 NOTE — Telephone Encounter (Signed)
Carbidopa Levodopa 50/200 refill requested. Per last office note- patient to remain on medication. Refill approved and sent to patient's pharmacy.

## 2015-11-11 ENCOUNTER — Telehealth: Payer: Self-pay | Admitting: Neurology

## 2015-11-11 NOTE — Telephone Encounter (Signed)
SCAT paperwork mailed to patient.   DMV paperwork completed and faxed to [employer] at [phone].

## 2015-11-13 DIAGNOSIS — H26493 Other secondary cataract, bilateral: Secondary | ICD-10-CM | POA: Diagnosis not present

## 2015-11-13 DIAGNOSIS — H01001 Unspecified blepharitis right upper eyelid: Secondary | ICD-10-CM | POA: Diagnosis not present

## 2015-11-13 DIAGNOSIS — H04123 Dry eye syndrome of bilateral lacrimal glands: Secondary | ICD-10-CM | POA: Diagnosis not present

## 2015-11-13 DIAGNOSIS — H43813 Vitreous degeneration, bilateral: Secondary | ICD-10-CM | POA: Diagnosis not present

## 2015-11-18 ENCOUNTER — Telehealth: Payer: Self-pay | Admitting: Behavioral Health

## 2015-11-18 NOTE — Telephone Encounter (Signed)
Patient declined providing Pre-Visit Info at this time.

## 2015-11-19 ENCOUNTER — Ambulatory Visit (INDEPENDENT_AMBULATORY_CARE_PROVIDER_SITE_OTHER): Payer: Medicare Other | Admitting: Internal Medicine

## 2015-11-19 ENCOUNTER — Encounter: Payer: Self-pay | Admitting: Internal Medicine

## 2015-11-19 VITALS — BP 122/66 | HR 82 | Temp 98.2°F | Resp 12 | Ht 71.0 in | Wt 168.5 lb

## 2015-11-19 DIAGNOSIS — M150 Primary generalized (osteo)arthritis: Secondary | ICD-10-CM | POA: Diagnosis not present

## 2015-11-19 DIAGNOSIS — F329 Major depressive disorder, single episode, unspecified: Secondary | ICD-10-CM

## 2015-11-19 DIAGNOSIS — I10 Essential (primary) hypertension: Secondary | ICD-10-CM | POA: Diagnosis not present

## 2015-11-19 DIAGNOSIS — G20 Parkinson's disease: Secondary | ICD-10-CM

## 2015-11-19 DIAGNOSIS — L603 Nail dystrophy: Secondary | ICD-10-CM

## 2015-11-19 DIAGNOSIS — Z09 Encounter for follow-up examination after completed treatment for conditions other than malignant neoplasm: Secondary | ICD-10-CM | POA: Insufficient documentation

## 2015-11-19 DIAGNOSIS — F32A Depression, unspecified: Secondary | ICD-10-CM

## 2015-11-19 DIAGNOSIS — F0280 Dementia in other diseases classified elsewhere without behavioral disturbance: Secondary | ICD-10-CM

## 2015-11-19 DIAGNOSIS — E785 Hyperlipidemia, unspecified: Secondary | ICD-10-CM

## 2015-11-19 DIAGNOSIS — M159 Polyosteoarthritis, unspecified: Secondary | ICD-10-CM

## 2015-11-19 NOTE — Patient Instructions (Signed)
GO TO THE LAB : Get the blood work     GO TO THE FRONT DESK Schedule your next appointment for a  physical exam by January 2018   OK to  take Advil OTC 1 tablet twice a day as needed for pain . Always take it with food because may cause gastritis and ulcers.  If you notice nausea, stomach pain, change in the color of stools --->  Stop the medicine and let [name] know

## 2015-11-19 NOTE — Assessment & Plan Note (Signed)
HTN: Continue losartan, check a BMP Hyperlipidemia: On no medications, last FLP satisfactory Depression anxiety: Sx relatively well controlled on Lexapro Parkinson disease: closely follow-up by neurology Dementia: Mild, today oriented 3, only failed to recognize the day of the week. DJD: On Advil and oxycodone, plans to see Dr. Nelva Bush, nsaids--->  GI precautions discussed. Dystrophic nails: Previously seen by podiatry, will refer back to them, needs some trimming. Could be a fungal infection, consider treatment in the future RTC 5 months

## 2015-11-19 NOTE — Progress Notes (Signed)
Subjective:    Patient ID: [name], male    DOB: [birth year], 82 y.o.   MRN: [id number]  DOS:  11/19/2015 Type of visit - description : New patient, transferring from Dr. Birdie Riddle  Interval history: New pt to me---> reviewed the chart and summarized below, see a/p. History of Parkinson's: Good days and bad days as far as sx is concerned. Follow-up by neurology DJD: Used to take meloxicam, make him sleepy and now is taking Advil OTC. Pain in the back has somewhat increased and plans to see Dr. Nelva Bush Depression anxiety: On Lexapro, symptoms relatively well controlled HTN: On losartan, BP today is very good.   Review of Systems Denies chest pain or difficulty breathing No nausea, vomiting or diarrhea. Occasional constipation. Concern about his nails, used to see podiatry.  Past Medical History:  Diagnosis Date  . Allergic rhinitis   . Bladder cancer [facility])    urologist-  dr eskridge--  low grade TA  . BPH with elevated PSA   . DDD (degenerative disc disease), lumbar   . Depression   . Dry mouth    USES BIOTIN SPRAY/ MOUTHWASH  . Frequency of urination   . GERD (gastroesophageal reflux disease)   . Hearing loss    does not wear his hearing aid  . Hyperlipidemia   . Hypertension   . Mild obstructive sleep apnea    PER PT  MILD OSA , NO CPAP RX,  STUDY DONE 2009  . Nocturia   . OAB (overactive bladder)   . Osteoarthritis    KNEES, SHOULDER  . Parkinson disease ([place]) DX   2005   NEUROLOGIST-   DR TAT--  IDIOPATHIC PARKINSON'S /  TREMORS CONTROLLED WITH MEDS  . Urge urinary incontinence     Past Surgical History:  Procedure Laterality Date  . CATARACT EXTRACTION W/ INTRAOCULAR LENS  IMPLANT, BILATERAL  2014  . CYSTOSCOPY W/ RETROGRADES Bilateral 12/14/2014   Procedure: CYSTOSCOPY BLADDER BIOPSY FULGERATION MITOMYCIN C BILATERAL RETROGRADE PYELOGRAM;  Surgeon: Festus Aloe, MD;  Location: [facility];  Service: Urology;  Laterality: Bilateral;  .  CYSTOSCOPY WITH STENT PLACEMENT Left 12/19/2013   Procedure: CYSTOSCOPY BLADDER BX, LEFT URETERAL STENT PLACEMENT , LEFT RETROGRADE AND PYLOGRAM;  Surgeon: Festus Aloe, MD;  Location: [facility];  Service: Urology;  Laterality: Left;  . INGUINAL HERNIA REPAIR  03/09/2012   Procedure: HERNIA REPAIR INGUINAL ADULT;  Surgeon: Adin Hector, MD;  Location: [facility];  Service: General;  Laterality: Right;  . INSERTION OF MESH  03/09/2012   Procedure: INSERTION OF MESH;  Surgeon: Adin Hector, MD;  Location: [facility];  Service: General;  Laterality: Right;  . NASAL SINUS SURGERY  1982  . ORIF RIGHT ARM FX  1949   HARDWARE REMOVED   . REMOVAL BENIGN CYST LEFT FOREHEAD  1969  . TRANSURETHRAL RESECTION OF BLADDER TUMOR N/A 12/19/2013   Procedure:  TRANSURETHRAL RESECTION OF BLADDER TUMOR WITH GYRUS (TURBT-GYRUS);  Surgeon: Festus Aloe, MD;  Location: [facility];  Service: Urology;  Laterality: N/A;    Social History   Social History  . Marital status: Married    Spouse name: [name]  . Number of children: 1  . Years of education: N/A   Occupational History  . retired 1995, worked in [place] , finances     Social History Main Topics  . Smoking status: Former Smoker    Packs/day: 1.00    Years: 15.00    Types:  Cigarettes    Quit date: 04/20/1968  . Smokeless tobacco: Never Used  . Alcohol use 4.2 oz/week    7 Glasses of wine per week     Comment: ONE WINE PER DAY  . Drug use: No  . Sexual activity: Not on file   Other Topics Concern  . Not on file   Social History Narrative   Lives w/ wife   Has a Daughter 35 y/o        Medication List       Accurate as of 11/19/15  6:00 PM. Always use your most recent med list.          ADVIL 200 MG tablet Generic drug:  ibuprofen Take 400 mg by mouth 2 (two) times daily as needed.   aspirin 81 MG tablet Take 2 tablets (162 mg total) by mouth daily.   atorvastatin 40 MG tablet Commonly known  as:  LIPITOR TAKE 1 TABLET (40 MG TOTAL) BY MOUTH DAILY.   carbidopa-levodopa 25-100 MG tablet Commonly known as:  SINEMET IR TAKE TWO TABLETS BY MOUTH FOUR TIMES DAILY   carbidopa-levodopa 50-200 MG tablet Commonly known as:  SINEMET CR Take 1 tablet by mouth at bedtime.   diclofenac sodium 1 % Gel Commonly known as:  VOLTAREN Apply topically as needed.   escitalopram 20 MG tablet Commonly known as:  LEXAPRO Take 1 tablet (20 mg total) by mouth daily.   fexofenadine 180 MG tablet Commonly known as:  ALLEGRA Take 180 mg by mouth daily.   fluorouracil 5 % cream Commonly known as:  EFUDEX Apply topically 2 (two) times daily.   fluticasone 50 MCG/ACT nasal spray Commonly known as:  FLONASE USE TWO SPRAYS IN EACH NOSTRIL DAILY   losartan 100 MG tablet Commonly known as:  COZAAR TAKE 1 TABLET (100 MG TOTAL) BY MOUTH DAILY.   oxyCODONE-acetaminophen 5-325 MG tablet Commonly known as:  PERCOCET/ROXICET Take 1 tablet by mouth every 4 (four) hours as needed. For back pain   polyethylene glycol packet Commonly known as:  MIRALAX / GLYCOLAX Take 17 g by mouth daily as needed. For laxative   PROBIOTIC DAILY PO Take 1 capsule by mouth daily.   ROLAIDS PO Take by mouth as needed.   selegiline 5 MG tablet Commonly known as:  ELDEPRYL Take 1 tablet (5 mg total) by mouth 2 (two) times daily with a meal.   tamsulosin 0.4 MG Caps capsule Commonly known as:  FLOMAX Take 0.4 mg by mouth daily after supper.   VESICARE 10 MG tablet Generic drug:  solifenacin Take 10 mg by mouth every evening.          Objective:   Physical Exam BP 122/66 (BP Location: Left Arm, Patient Position: Sitting, Cuff Size: Normal)   Pulse 82   Temp 98.2 F (36.8 C) (Oral)   Resp 12   Ht 5\' 11"  (1.803 m)   Wt 168 lb 8 oz (76.4 kg)   SpO2 97%   BMI 23.50 kg/m  General:   Well developed, well nourished . NAD.  HEENT:  Normocephalic . Face symmetric, atraumatic Lungs:  CTA B Normal  respiratory effort, no intercostal retractions, no accessory muscle use. Heart: RRR,  no murmur.  no pretibial edema bilaterally  Abdomen:  Not distended, soft, non-tender. No rebound or rigidity.  Skin: Not pale. Not jaundice. Nails dystrophic, thick Neurologic:  alert & oriented times, self, place (only failed to say the day of the week).  Speech normal Psych--  Cognition  and judgment appear intact.  Cooperative with normal attention span and concentration.  Behavior appropriate. No anxious or depressed appearing.    Assessment & Plan:    Assessment HTN Hyperlipidemia Depression - anxiety OSA, mild, no CPAP Parkinson disease Dr. Carles Collet Dementia, mild GU:  --Dr. Junious Silk --BPH, elevated PSA --Bladder cancer-low-grade --OAB -- E.D. Dysphagia, MBE 09-2013 normal Dry mouth  DJD- back pain, sees Dr Nelva Bush, s/p local injections, on prn oxycodone HOH   Plan: HTN: Continue losartan, check a BMP Hyperlipidemia: On no medications, last FLP satisfactory Depression anxiety: Sx relatively well controlled on Lexapro Parkinson disease: closely follow-up by neurology Dementia: Mild, today oriented 3, only failed to recognize the day of the week. DJD: On Advil and oxycodone, plans to see Dr. Nelva Bush, nsaids--->  GI precautions discussed. Dystrophic nails: Previously seen by podiatry, will refer back to them, needs some trimming. Could be a fungal infection, consider treatment in the future RTC 5 months CPX  Today, I spent more than  31  min with the patient: >50% of the time counseling regards his chronic medical problems, he was particularly concerned about his nails. Also reviewing the chart and labs ordered by other providers

## 2015-11-19 NOTE — Progress Notes (Signed)
Pre visit review using our clinic review tool, if applicable. No additional management support is needed unless otherwise documented below in the visit note. 

## 2015-11-20 LAB — BASIC METABOLIC PANEL
BUN: 27 mg/dL — ABNORMAL HIGH (ref 6–23)
CALCIUM: 9.5 mg/dL (ref 8.4–10.5)
CO2: 25 mEq/L (ref 19–32)
CREATININE: 0.99 mg/dL (ref 0.40–1.50)
Chloride: 107 mEq/L (ref 96–112)
GFR: 77.38 mL/min (ref >60.00)
Glucose, Bld: 95 mg/dL (ref 70–99)
Potassium: 4.4 mEq/L (ref 3.5–5.1)
Sodium: 142 mEq/L (ref 135–145)

## 2015-12-02 DIAGNOSIS — L820 Inflamed seborrheic keratosis: Secondary | ICD-10-CM | POA: Diagnosis not present

## 2015-12-02 DIAGNOSIS — L720 Epidermal cyst: Secondary | ICD-10-CM | POA: Diagnosis not present

## 2015-12-02 DIAGNOSIS — D485 Neoplasm of uncertain behavior of skin: Secondary | ICD-10-CM | POA: Diagnosis not present

## 2015-12-11 ENCOUNTER — Ambulatory Visit (INDEPENDENT_AMBULATORY_CARE_PROVIDER_SITE_OTHER): Payer: Medicare Other | Admitting: Podiatry

## 2015-12-11 ENCOUNTER — Encounter: Payer: Self-pay | Admitting: Podiatry

## 2015-12-11 DIAGNOSIS — M79675 Pain in left toe(s): Secondary | ICD-10-CM

## 2015-12-11 DIAGNOSIS — B351 Tinea unguium: Secondary | ICD-10-CM

## 2015-12-11 DIAGNOSIS — M79674 Pain in right toe(s): Secondary | ICD-10-CM | POA: Diagnosis not present

## 2015-12-11 NOTE — Progress Notes (Signed)
   Subjective:    Patient ID: [name], male    DOB: [birth year], 82 y.o.   MRN: [id number]  HPI  82 year old male presents the office today for concerns of toenail thickening and discoloration. He is referred today by Dr. Larose Kells. He was using some over-the-counter treatment for nails which was Nonyx. He is in use and is intermittent leave the last 2 years without any resolution. He'll at the descending within the toenails. He states this is biopsied a couple years ago and did not have fungus. He states the nails to get painful/irritated with shoes and socks. No redness or drainage. No other complaints at this time. He states he cannot trim the nails himself   Review of Systems  All other systems reviewed and are negative.      Objective:   Physical Exam General: AAO x3, NAD  Dermatological: Nails are hypertrophic, dystrophic, brittle, discolored, elongated 10. No surrounding redness or drainage. Tenderness nails 1-5 bilaterally. No open lesions or pre-ulcerative lesions are identified today.  Vascular: Dorsalis Pedis artery and Posterior Tibial artery pedal pulses are 2/4 bilateral with immedate capillary fill time.  There is no pain with calf compression, swelling, warmth, erythema.   Neruologic: Grossly intact via light touch bilateral. Vibratory intact via tuning fork bilateral. Protective threshold with Semmes Wienstein monofilament intact to all pedal sites bilateral.   Musculoskeletal: No gross boney pedal deformities bilateral. No pain, crepitus, or limitation noted with foot and ankle range of motion bilateral. Muscular strength 5/5 in all groups tested bilateral.  Gait: Unassisted, Nonantalgic.      Assessment & Plan:  82 year old male with symptomatic onychomycosis/onychodystrophy -Treatment options discussed including all alternatives, risks, and complications -Etiology of symptoms were discussed -Nails debrided 10 without complications or bleeding. -Ordered urea  cream -Daily foot inspection -Follow-up in 3 months or sooner if any problems arise. In the meantime, encouraged to call the office with any questions, concerns, change in symptoms.   Celesta Gentile, DPM

## 2015-12-13 MED ORDER — NONFORMULARY OR COMPOUNDED ITEM: 2 refills | Status: DC

## 2015-12-13 NOTE — Addendum Note (Signed)
Addended by: Harriett Sine D on: 12/13/2015 09:09 AM   Modules accepted: Orders

## 2015-12-17 ENCOUNTER — Other Ambulatory Visit: Payer: Self-pay | Admitting: Family Medicine

## 2016-01-04 ENCOUNTER — Other Ambulatory Visit: Payer: Self-pay | Admitting: Family Medicine

## 2016-01-06 ENCOUNTER — Encounter: Payer: Self-pay | Admitting: Neurology

## 2016-01-06 MED ORDER — MIRTAZAPINE 15 MG PO TABS: 15.0000 mg | ORAL_TABLET | Freq: Every day | ORAL | 2 refills | Status: DC

## 2016-01-06 NOTE — Telephone Encounter (Signed)
Jade, let them know we can try remeron, 15 mg q hs.  When he starts this decrease lexapro to 10 mg daily for a month and then d/c.  Tell them if no help, may have to seek help of psychiatry or PCP.

## 2016-01-19 ENCOUNTER — Other Ambulatory Visit: Payer: Self-pay | Admitting: Neurology

## 2016-02-07 ENCOUNTER — Other Ambulatory Visit: Payer: Self-pay | Admitting: Family Medicine

## 2016-02-07 ENCOUNTER — Other Ambulatory Visit: Payer: Self-pay | Admitting: Neurology

## 2016-02-13 DIAGNOSIS — S80211A Abrasion, right knee, initial encounter: Secondary | ICD-10-CM | POA: Diagnosis not present

## 2016-02-13 DIAGNOSIS — M25552 Pain in left hip: Secondary | ICD-10-CM | POA: Diagnosis not present

## 2016-02-13 DIAGNOSIS — J018 Other acute sinusitis: Secondary | ICD-10-CM | POA: Diagnosis not present

## 2016-03-09 NOTE — Progress Notes (Signed)
[name] was seen today in the movement disorders clinic for neurologic consultation at the request of [name], MD.  The consultation is for the evaluation of PD.  The pt is accompanied by his wife who supplements the hx.  The patient was previously seen by [name] and I did have the opportunity to review those records.  Patient is wishing to transfer care because of the length of travel to [facility].  The first symptom(s) the patient noticed was unilateral hand tremor of the L hand and this was approximately 2005.  Pt is L hand dominant.   Pt went to his PCP and he was referred to [facility] and was dx with PD.  I do not have those records. Pt believes that he was started on requip but doesn't think that it helped.  He thinks that he was on it for about a year and his wife believes that it caused sleep attacks.  He is not sure what the next medication was but the physician at [facility] left and he was then referred to [name] and he has been seeing him and his NP since.  The patient is currently on carbidopa/levodopa 50/200 CR 1-1/2 tablets at 8 AM/ and then one at noon/4pm/8pm along with a carbidopa/levodopa 25/100 in the afternoon and in the evening.  He admits that he often misses dosages.  He is on amantadine 100 mg 4 times a day and selegiline 5 mg twice a day.  01/02/14 update:  The patient returns today for followup.  Last visit, I discontinued the patient's extended release levodopa as he was on a strange combination of immediate release and extended release carbidopa/levodopa.  He is currently on carbidopa/levodopa 25/100 IR and takes 2 tablets at 8 AM/noon/4 PM/8 PM and then carbidopa/levodopa 50/200 at night.  Pt states that the first week after we changed, he felt very sleepy but he no longer feels that way.  I asked him to move his selegiline to 8 AM and noon because of reported insomnia.  The patient did have a modified barium swallow test done since last visit.   That was normal.  Since our last visit, the patient did have a bladder neoplasm removed on 12/19/2013 and a stent was placed.  The patient has recovered well.  He still has quite a bit of hematuria though.  He does need the stent removed.  No falls.  Limited exercise.  Was referred to neurorehab since last visit but states that he chose not to go.  Going to see mental health counselor at [facility] and that is helping.  Remains on amantadine.  04/30/14 update:   He is currently on carbidopa/levodopa 25/100 IR and takes 2 tablets at 8 AM/noon/4 PM/8 PM and then carbidopa/levodopa 50/200 at night. He is still on selegeline 5 mg bid and amantadine 100 mg.  States that often times, he is taking the selegeline too late and then has trouble sleeping.  He on Lexapro for depression.  He has not had any falls since last visit.  Not formally exercising; "I know that it needs to be done but I cannot seem to get it in the routine."  No hallucinations.  Finds that he is sleeping a bit more during the day.  Did start vesicare and that may have affected balance.  It may have helped the bladder, however.     07/30/14 update:  He is currently on carbidopa/levodopa 25/100 IR and takes 2  tablets at 8 AM/noon/4 PM/8 PM and then carbidopa/levodopa 50/200 at night.  He is also on selegiline twice per day.  He takes amantadine 100 mg tid.  I reviewed records made available to me since last visit.  The patient went to the emergency room on 06/14/2014.  He accidentally hit his head against the car door (it was a windy day and the car door shut on him) and sustained a laceration above the left forehead.  There was no loss of consciousness or alteration of consciousness.  Dermabond was placed and the patient was sent home from the emergency room.  He c/o dry mouth and thinks that it is due to medication.  He signed up at the [facility] for exercise.  He just signed up Saturday afternoon.  No lightheadness/near syncope.  No  hallucinations.  Feels a little "dopey" in the AM.  Once he takes his medications, he feels less cognitively dull.  However, he also thinks his medications may contribute to cognitive dulling.  He also complains of decreased feeling in the first to third fingers (thumb, pointer and middle) bilaterally.  He has difficulty with grasp and dropping objects.  He notices it most when he is trying to text and has to use a stylus for that.  11/29/14 update:  Pt is seen today in f/u.  He remains on carbidopa/levodopa 25/100, 2 tablets at 8 AM/12 PM/4 PM/8 p.m. in addition to carbidopa/levodopa 50/200 at night.  He is on selegiline at 8 AM and noon and amantadine, 100 mg 3 times a day.  I reviewed notes from his primary care provider.  He saw her on 11/04/2014.  He was reporting increasing depression.  He was given numbers to contact for counseling.  He hasn't done that yet.  His wife got a promotion at work and he misses having her home. He remains on Lexapro. He has no SI/HI.  He reports that he has good and bad days but is overall the same as previous.  He reports that a few weeks ago he started to have the need for an afternoon nap.  He is only asleep for 15-20 minutes (he is unsure if the unscheduled nap happens before or after the second dose of selegeline).  He is having some difficulty with his voice.  He also c/o dry mouth despite biotene. He did have a fall since last visit in his garage; states that he lost balance and went to the left and got some superficial abrasions.  Admits that while he joined [employer], he is going very little.  He doesn't like going without his wife.  Notes more difficulty tying shoes on the right.  04/02/15 update:  The patient is seen in follow-up today, accompanied by his wife who supplements the history.  I have reviewed records since last visit.  He remains on carbidopa/levodopa 25/100, 2 tablets at 8 AM/12 PM/4 PM/8 PM in addition to carbidopa/levodopa 50/200 at night.  He is on  selegiline, 5 mg twice a day and amantadine, 100 mg 3 times per day.  He was complaining about dry mouth last time and I told him he could try and decrease the amantadine to see if that helped, and he did that.  He continues to c/o dry mouth.  He states that dry mouth is even worse.  He has other medication reasons for dry mouth as well, including the Vesicare.  He thinks that dropping the amantadine caused him to be more sleepy.  He did,  however, move his Lexapro to taking that to earlier in the day to see if it helped his anxiety.  He admits to increased anxiety and fears about Parkinson's disease symptoms getting worse.  He states that he has been losing weight for many months (weight has been stable here).  I asked him about depression and his wife definitely endorsed this although he initially denied.  He did undergo a cystoscopy on 12/14/2014.  Those records were reviewed.  A low grade recurrence of his bladder cancer was noted. The patient denies any falls since last visit but has had some near falls.  No visual hallucinations but has some auditory hallucinations (hears people speaking).  No lightheadedness or near syncope.  His swallowing has been good as long as he is careful.  He did attend therapy at the neurorehab center since our last visit.  He hasn't been exercising much since leaving therapy.  States that he has absolutely no time to exercise and becomes very defensive when asked about exercise.  He states that he refuses to do this.  He and his wife brought up concerns about driving and slowed reflexes.  07/04/15 update:  The patient is seen in follow-up today.  I have reviewed records since last visit.  He remains on carbidopa/levodopa 25/100, 2 tablets at 8 AM/12 PM/4 PM/8 PM in addition to carbidopa/levodopa 50/200 at night.  He is on selegiline, 5 mg twice a day and amantadine, 100 mg 3 times per day.  He was complaining about dry mouth last time and I told him he could try and decrease the  amantadine to see if that helped, but he really did not do that.  He has other medication reasons for dry mouth as well, including the Vesicare.  Last visit, I did increase his Lexapro to 20 mg daily and strongly encouraged him to go to counseling.  His primary care physician has encouraged this multiple times as well.  He has not done this.  He also has refused to participate with cardiovascular exercise. He is c/o EDS today.  Wife states sleeping more too.   I gave him a phone number to set up a driving evaluation last visit, but he did not follow through with that.  His wife states that they are still considering this but she hasn't noted any problems.    He has had 2 falls.  He had one on front porch - was painting the railing and fell back from first step.  With the other fall, he fell over some tools that were laying out and he fell on his back and hit his head.  No LOC.  No MS change since.     11/07/15 update:  The patient is seen in follow-up today, accompanied by wife and daughter who supplement the history.  I have reviewed records since last visit.  He remains on carbidopa/levodopa 25/100, 2 tablets at 8 AM/12 PM/4 PM/8 PM in addition to carbidopa/levodopa 50/200 at night.  He is on selegiline, 5 mg twice a day and amantadine, 100 mg 3 times per day.  I asked him to go ahead and wean off of the amantadine last visit because of cognitive decline and he did that.  He underwent neuropsych testing on 08/06/2015.  This demonstrated mild dementia, major depressive disorder, generalized anxiety disorder.  He has adamantly refused counseling.  He also has refused to participate with cardiovascular exercise in the past but his wife states that they did purchase a total gym weight  machine and he is using that now.  In the past, he has been given the number for the on road driving test several times and refuses to set up that evaluation, but also refuses to follow our recommendations that if he does not want to do  the on road test, then he should not be driving.  The neuropsych test suggested significant concerns in the area of his driving.  He comes today with a form from the [facility] and his license was suspended until it was completed.  They brought a SCAT application today as well.  He canceled his physical therapy since our last visit.  There was a fall going up the garage stairs but he didn't get hurt.  This was the only fall.  No lightheadedness or near syncope.  He has 1 hour nap per day.    03/10/16 update:   The patient follows up today, accompanied by his wife who supplements the history.  He is on carbidopa/levodopa 25/100, 2 tablets at 8 AM/noon/4 PM/8 PM in addition to carbidopa/levodopa 50/200 at night.  He remains on selegiline, 5 mg twice per day.  I got an email from the patient/his family since last visit stating that anxiety/depression and sleep issues were heightened.  We started him on Remeron.  He still has trouble sleeping.  He is active some day and not other days but wife not sure if days he is more physically active if he sleeps better.  He gets up in the middle of the night and he does paperwork.  He fell on 02/07/16 outside.  He fell down his stairs outside.   He hit his knee and scraped it and he had pain in the hip on the L.  He had what sounds like a hematoma after that.  He ended up going to UC and had an xray that was negative for fx.  He states that it still aches.  They still haven't done a driving evaluation.  He is still doing some limited driving.  States that [employer] reinstated his license without ever testing him.  He did qualify for SCAT.   Asks me again about his dry mouth.  Off of vesicare and on myrbetriq.   PREVIOUS MEDICATIONS: Sinemet, Sinemet CR, Requip and Eldpryl Requip (sleep attacks)  ALLERGIES:  No Known Allergies  CURRENT MEDICATIONS:  Current Outpatient Prescriptions on File Prior to Visit  Medication Sig Dispense Refill  . aspirin 81 MG tablet Take 2 tablets (162 mg  total) by mouth daily. 30 tablet   . atorvastatin (LIPITOR) 40 MG tablet Take 1 tablet (40 mg total) by mouth daily. 90 tablet 1  . Ca Carbonate-Mag Hydroxide (ROLAIDS PO) Take by mouth as needed.    . carbidopa-levodopa (SINEMET CR) 50-200 MG tablet Take 1 tablet by mouth at bedtime. 30 tablet 5  . carbidopa-levodopa (SINEMET IR) 25-100 MG tablet TAKE TWO TABLETS BY MOUTH FOUR TIMES DAILY 240 tablet 5  . diclofenac sodium (VOLTAREN) 1 % GEL Apply topically as needed.    Marland Kitchen escitalopram (LEXAPRO) 20 MG tablet Take 1 tablet (20 mg total) by mouth daily. 90 tablet 3  . fexofenadine (ALLEGRA) 180 MG tablet Take 180 mg by mouth daily.     . fluorouracil (EFUDEX) 5 % cream Apply topically 2 (two) times daily.    . fluticasone (FLONASE) 50 MCG/ACT nasal spray Place 2 sprays into both nostrils daily. 16 g 5  . ibuprofen (ADVIL) 200 MG tablet Take 400 mg by mouth 2 (two)  times daily as needed.    Marland Kitchen losartan (COZAAR) 100 MG tablet Take 1 tablet (100 mg total) by mouth daily. 30 tablet 5  . mirtazapine (REMERON) 15 MG tablet Take 1 tablet (15 mg total) by mouth at bedtime. 30 tablet 2  . NONFORMULARY OR COMPOUNDED ITEM [facility]:  Urea 40%, apply daily to affected areas. 120 each 2  . oxyCODONE-acetaminophen (PERCOCET) 5-325 MG per tablet Take 1 tablet by mouth every 4 (four) hours as needed. For back pain    . polyethylene glycol (MIRALAX / GLYCOLAX) packet Take 17 g by mouth daily as needed. For laxative    . Probiotic Product (PROBIOTIC DAILY PO) Take 1 capsule by mouth daily.    . selegiline (ELDEPRYL) 5 MG tablet TAKE 1 TABLET (5 MG TOTAL) BY MOUTH 2 (TWO) TIMES DAILY WITH A MEAL. 60 tablet 5  . VESICARE 10 MG tablet Take 10 mg by mouth every evening.   10   No current facility-administered medications on file prior to visit.     PAST MEDICAL HISTORY:   Past Medical History:  Diagnosis Date  . Allergic rhinitis   . Bladder cancer [facility])    urologist-  dr eskridge--  low grade TA  . BPH  with elevated PSA   . DDD (degenerative disc disease), lumbar   . Depression   . Dry mouth    USES BIOTIN SPRAY/ MOUTHWASH  . Frequency of urination   . GERD (gastroesophageal reflux disease)   . Hearing loss    does not wear his hearing aid  . Hyperlipidemia   . Hypertension   . Mild obstructive sleep apnea    PER PT  MILD OSA , NO CPAP RX,  STUDY DONE 2009  . Nocturia   . OAB (overactive bladder)   . Osteoarthritis    KNEES, SHOULDER  . Parkinson disease ([place]) DX   2005   NEUROLOGIST-   DR TAT--  IDIOPATHIC PARKINSON'S /  TREMORS CONTROLLED WITH MEDS  . Urge urinary incontinence     PAST SURGICAL HISTORY:   Past Surgical History:  Procedure Laterality Date  . CATARACT EXTRACTION W/ INTRAOCULAR LENS  IMPLANT, BILATERAL  2014  . CYSTOSCOPY W/ RETROGRADES Bilateral 12/14/2014   Procedure: CYSTOSCOPY BLADDER BIOPSY FULGERATION MITOMYCIN C BILATERAL RETROGRADE PYELOGRAM;  Surgeon: Festus Aloe, MD;  Location[facility];  Service: Urology;  Laterality: Bilateral;  . CYSTOSCOPY WITH STENT PLACEMENT Left 12/19/2013   Procedure: CYSTOSCOPY BLADDER BX, LEFT URETERAL STENT PLACEMENT , LEFT RETROGRADE AND PYLOGRAM;  Surgeon: Festus Aloe, MD;  Location: [facility];  Service: Urology;  Laterality: Left;  . INGUINAL HERNIA REPAIR  03/09/2012   Procedure: HERNIA REPAIR INGUINAL ADULT;  Surgeon: Adin Hector, MD;  Location: [facility];  Service: General;  Laterality: Right;  . INSERTION OF MESH  03/09/2012   Procedure: INSERTION OF MESH;  Surgeon: Adin Hector, MD;  Location: [facility];  Service: General;  Laterality: Right;  . NASAL SINUS SURGERY  1982  . ORIF RIGHT ARM FX  1949   HARDWARE REMOVED   . REMOVAL BENIGN CYST LEFT FOREHEAD  1969  . TRANSURETHRAL RESECTION OF BLADDER TUMOR N/A 12/19/2013   Procedure:  TRANSURETHRAL RESECTION OF BLADDER TUMOR WITH GYRUS (TURBT-GYRUS);  Surgeon: Festus Aloe, MD;  Location: [facility];   Service: Urology;  Laterality: N/A;    SOCIAL HISTORY:   Social History   Social History  . Marital status: Married    Spouse name:  Lee  . Number of children: 1  . Years of education: N/A   Occupational History  . retired 1995, worked in [place] , finances     Social History Main Topics  . Smoking status: Former Smoker    Packs/day: 1.00    Years: 15.00    Types: Cigarettes    Quit date: 04/20/1968  . Smokeless tobacco: Never Used  . Alcohol use 4.2 oz/week    7 Glasses of wine per week     Comment: ONE WINE PER DAY  . Drug use: No  . Sexual activity: Not on file   Other Topics Concern  . Not on file   Social History Narrative   Lives w/ wife   Has a Daughter 57 y/o    FAMILY HISTORY:   Family Status  Relation Status  . Mother Deceased   alzeihmer's disease  . Father Deceased   liver cancer, colon cancer  . Brother Alive  . Brother Deceased   killed   . Daughter Alive    ROS:  A complete 10 system review of systems was obtained and was unremarkable apart from what is mentioned above.  PHYSICAL EXAMINATION:    VITALS:   Vitals:   03/10/16 1128  BP: 140/70  Pulse: 88  Weight: 174 lb (78.9 kg)  Height: 5\' 11"  (1.803 m)   Wt Readings from Last 3 Encounters:  03/10/16 174 lb (78.9 kg)  11/19/15 168 lb 8 oz (76.4 kg)  11/07/15 171 lb (77.6 kg)    GEN:  The patient appears stated age and is in NAD. HEENT:  Normocephalic, atraumatic.  The mucous membranes are moist. The superficial temporal arteries are without ropiness or tenderness. CV:  RRR Lungs:  CTAB Neck/HEME:  There are no carotid bruits bilaterally.  Neurological examination:  Orientation: The patient is alert and oriented x3. Cranial nerves: There is good facial symmetry.  There is mild facial hypomimia.   Extraocular muscles are intact. The visual fields are full to confrontational testing. The speech is fluent and clear.  The patient has some difficulty with the guttural sounds.  He is  mildly hypophonic.  Soft palate rises symmetrically and there is no tongue deviation. Hearing is intact to conversational tone. Sensation: Sensation is intact to light touch throughout. Motor: Strength is 5/5 in the bilateral upper and lower extremities.   Shoulder shrug is equal and symmetric.  There is no pronator drift.   Movement examination: Tone: There is normal tone in the bilateral upper extremities.  The tone in the lower extremities is normal.  Abnormal movements: There is no dyskinesia today Coordination:  There is good rapid alternating movements today. Gait and Station: The patient has no significant difficulty arising out of a deep-seated chair without the use of the hands. The patient's stride length is good today.  There is no dyskinesia today  There is mild camptocormia to the L.  There is decreased arm swing on the right.  There is mild LUE tremor with ambulation.  ASSESSMENT/PLAN:  1.  Idiopathic Parkinson's disease  -He will continue taking carbidopa/levodopa 25/100 IR 2 tablets at 8 AM/noon/4 PM/8 PM and then take the 50/200 CR at bedtime.  -d/c selegeline.  Not sure it is helpful and potential interaction with antidepressants.  Will monitor to see if EDS increases off of med. 2.  Dysphagia.  -MBE on 09/29/13 was normal and no problems currently 3.  Depression, confirmed by neuropsych testing and insomnia  -continue remeron  -add melatonin,  3mg   -would benefit from CBT but doesn't wish to go 4.  PDD  -He underwent neuropsych testing on 08/06/2015.  This confirmed mild dementia.    -shouldn't be driving without driving eval, despite fact that the [facility] reinstated his license (without any testing at all). 5.  Urinary incontinence  -seeing urology and on myrbetriq now 6.  Dry mouth  -  Has repetitively complained about this.  Discussed again that this is not a PD problem.  Asks me about a drug call evoxac.  Told him he needed to ask PCP about this as I'm unfamiliar with  this. 7.  I will plan on seeing him back in the next few months, sooner should new neurologic issues arise.  Much greater than 50% of this visit was spent in counseling with the patient.  This was first visit his daughter was present. Total face to face time:  30 min

## 2016-03-10 ENCOUNTER — Ambulatory Visit (INDEPENDENT_AMBULATORY_CARE_PROVIDER_SITE_OTHER): Payer: Medicare Other | Admitting: Podiatry

## 2016-03-10 ENCOUNTER — Ambulatory Visit (INDEPENDENT_AMBULATORY_CARE_PROVIDER_SITE_OTHER): Payer: Medicare Other | Admitting: Neurology

## 2016-03-10 ENCOUNTER — Encounter: Payer: Self-pay | Admitting: Podiatry

## 2016-03-10 ENCOUNTER — Encounter: Payer: Self-pay | Admitting: Neurology

## 2016-03-10 VITALS — BP 140/70 | HR 88 | Ht 71.0 in | Wt 174.0 lb

## 2016-03-10 DIAGNOSIS — M79675 Pain in left toe(s): Secondary | ICD-10-CM

## 2016-03-10 DIAGNOSIS — F330 Major depressive disorder, recurrent, mild: Secondary | ICD-10-CM

## 2016-03-10 DIAGNOSIS — G20 Parkinson's disease: Secondary | ICD-10-CM | POA: Diagnosis not present

## 2016-03-10 DIAGNOSIS — B351 Tinea unguium: Secondary | ICD-10-CM

## 2016-03-10 DIAGNOSIS — M79674 Pain in right toe(s): Secondary | ICD-10-CM

## 2016-03-10 DIAGNOSIS — F0280 Dementia in other diseases classified elsewhere without behavioral disturbance: Secondary | ICD-10-CM | POA: Diagnosis not present

## 2016-03-10 NOTE — Patient Instructions (Addendum)
1.  Stop selegeline.   2.  Exercise safely daily 3.  Start melatonin 3 mg daily 4.  Don't do activating activities if you wake up in the middle of the night

## 2016-03-11 DIAGNOSIS — R350 Frequency of micturition: Secondary | ICD-10-CM | POA: Diagnosis not present

## 2016-03-11 DIAGNOSIS — N401 Enlarged prostate with lower urinary tract symptoms: Secondary | ICD-10-CM | POA: Diagnosis not present

## 2016-03-11 DIAGNOSIS — R972 Elevated prostate specific antigen [PSA]: Secondary | ICD-10-CM | POA: Diagnosis not present

## 2016-03-11 DIAGNOSIS — C672 Malignant neoplasm of lateral wall of bladder: Secondary | ICD-10-CM | POA: Diagnosis not present

## 2016-03-16 NOTE — Progress Notes (Signed)
Subjective: 82 y.o. returns the office today for painful, elongated, thickened toenails which he cannot trim himself. Denies any redness or drainage around the nails. He has questions about the compound cream and how to apply the medication. Denies any acute changes since last appointment and no new complaints today. Denies any systemic complaints such as fevers, chills, nausea, vomiting.   Objective: AAO 3, NAD DP/PT pulses palpable, CRT less than 3 seconds  Nails hypertrophic, dystrophic, elongated, brittle, discolored 10. There is tenderness overlying the nails 1-5 bilaterally. There is no surrounding erythema or drainage along the nail sites. No open lesions or pre-ulcerative lesions are identified. No other areas of tenderness bilateral lower extremities. No overlying edema, erythema, increased warmth. No pain with calf compression, swelling, warmth, erythema.  Assessment: Patient presents with symptomatic onychomycosis  Plan: -Treatment options including alternatives, risks, complications were discussed -Nails sharply debrided 10 without complication/bleeding. -Discussed and how to apply the compound cream to the toenails and the purpose of this medicine. Discussed with him this is not that he has to do that if he desires to thin the toenails this may help. -Discussed daily foot inspection. If there are any changes, to call the office immediately.  -Follow-up in 3 months or sooner if any problems are to arise. In the meantime, encouraged to call the office with any questions, concerns, changes symptoms.  Celesta Gentile, DPM

## 2016-03-29 ENCOUNTER — Other Ambulatory Visit: Payer: Self-pay | Admitting: Neurology

## 2016-04-01 ENCOUNTER — Other Ambulatory Visit: Payer: Self-pay | Admitting: Neurology

## 2016-04-01 NOTE — Telephone Encounter (Signed)
Not mentioned in last note. Please advise if okay to refill.

## 2016-05-06 ENCOUNTER — Encounter: Payer: Self-pay | Admitting: Internal Medicine

## 2016-05-11 ENCOUNTER — Telehealth: Payer: Self-pay | Admitting: *Deleted

## 2016-05-11 NOTE — Telephone Encounter (Signed)
Declined AWV  °

## 2016-05-12 ENCOUNTER — Other Ambulatory Visit: Payer: Self-pay

## 2016-05-12 ENCOUNTER — Encounter: Payer: Self-pay | Admitting: Internal Medicine

## 2016-05-12 ENCOUNTER — Ambulatory Visit (INDEPENDENT_AMBULATORY_CARE_PROVIDER_SITE_OTHER): Payer: Medicare Other | Admitting: Internal Medicine

## 2016-05-12 VITALS — BP 126/72 | HR 66 | Temp 97.2°F | Resp 14 | Ht 71.0 in | Wt 175.4 lb

## 2016-05-12 DIAGNOSIS — Z23 Encounter for immunization: Secondary | ICD-10-CM

## 2016-05-12 DIAGNOSIS — I10 Essential (primary) hypertension: Secondary | ICD-10-CM

## 2016-05-12 DIAGNOSIS — E785 Hyperlipidemia, unspecified: Secondary | ICD-10-CM

## 2016-05-12 DIAGNOSIS — R609 Edema, unspecified: Secondary | ICD-10-CM | POA: Diagnosis not present

## 2016-05-12 DIAGNOSIS — F329 Major depressive disorder, single episode, unspecified: Secondary | ICD-10-CM | POA: Diagnosis not present

## 2016-05-12 DIAGNOSIS — Z0000 Encounter for general adult medical examination without abnormal findings: Secondary | ICD-10-CM | POA: Diagnosis not present

## 2016-05-12 DIAGNOSIS — F32A Depression, unspecified: Secondary | ICD-10-CM

## 2016-05-12 DIAGNOSIS — G20 Parkinson's disease: Secondary | ICD-10-CM

## 2016-05-12 DIAGNOSIS — G20A1 Parkinson's disease without dyskinesia, without mention of fluctuations: Secondary | ICD-10-CM

## 2016-05-12 MED ORDER — OXYCODONE-ACETAMINOPHEN 5-325 MG PO TABS: 1.0000 | ORAL_TABLET | Freq: Three times a day (TID) | ORAL | 0 refills | Status: DC | PRN

## 2016-05-12 MED ORDER — ZOSTER VACCINE LIVE 19400 UNT/0.65ML ~~LOC~~ SUSR: 0.6500 mL | Freq: Once | SUBCUTANEOUS | 0 refills | Status: AC

## 2016-05-12 NOTE — Assessment & Plan Note (Addendum)
Td -2013; pnm 13: 2016; pnm 23 2014, zostavax prescription printed. Flu shot  today  cscope 2010, due 2013, see GI letter , pt aware, pro-cons of proceed w/ cscope (in light of other med issues) d/w pt and wife. He will let me know if he likes to proceed with a colonoscopy, would like to switch to a different GI doctor . Prostate check-- per urology Labs: BMP, AST, ALT, FLP, CBC.

## 2016-05-12 NOTE — Progress Notes (Signed)
Subjective:    Patient ID: [name], male    DOB: 1933/08/18, 83 y.o.   MRN: [id number]  DOS:  05/12/2016 Type of visit - description : CPX  Interval history: In addition to a CPX, multiple other issues were discussed HTN: Good compliance of medication, no apparent side effects, normal ambulatory BPs Pain management: Takes Advil 200 mg 4 tablets every day, has a leftover of oxycodone was prescribed by Dr. Nelva Bush, would like a refill Anxiety depression: Medication was changed ,  doing better Had a fall 01-2016 from 3 steps, landed on the left side, has bruises throughout the left leg, went to urgent care, hip x-ray nonacute. Had another fall 04/24/2015, twisted the left knee and ankle, had moderate pain, now slightly better. Also, lower extremity edema B, worse on the left, gradually increasing for the last few weeks. Not sure if swelling is acutely worse since the most recent fall    Review of Systems  other than above, ROS is  negative   Constitutional: No fever. No chills. No unexplained wt changes. No unusual sweats  HEENT: No dental problems, no ear discharge, no facial swelling, no voice changes. No eye discharge, no eye  redness , no  intolerance to light   Respiratory: No wheezing , no  difficulty breathing. No cough , no mucus production  Cardiovascular: No CP, GI: no nausea, no vomiting, no diarrhea , no  abdominal pain.  No blood in the stools. No dysphagia, no odynophagia    Endocrine: No polyphagia, no polyuria , no polydipsia  GU: No dysuria, gross hematuria, difficulty urinating. No urinary urgency, no frequency.  Musculoskeletal: se above   Skin: No change in the color of the skin, palor , no  Rash  Allergic, immunologic: No environmental allergies , no  food allergies  Neurological: No dizziness no  syncope. No headaches. No diplopia, no slurred, no slurred speech, no motor deficits, no facial  Numbness  Hematological: No enlarged lymph nodes, no easy  bruising , no unusual bleedings  Psychiatry: No suicidal ideas, no hallucinations, no beavior problems, no confusion.  No unusual/severe anxiety, no depression   Past Medical History:  Diagnosis Date  . Allergic rhinitis   . Bladder cancer [facility])    urologist-  dr eskridge--  low grade TA  . BPH with elevated PSA   . DDD (degenerative disc disease), lumbar   . Depression   . Dry mouth    USES BIOTIN SPRAY/ MOUTHWASH  . Frequency of urination   . GERD (gastroesophageal reflux disease)   . Hearing loss    does not wear his hearing aid  . Hyperlipidemia   . Hypertension   . Mild obstructive sleep apnea    PER PT  MILD OSA , NO CPAP RX,  STUDY DONE 2009  . Nocturia   . OAB (overactive bladder)   . Osteoarthritis    KNEES, SHOULDER  . Parkinson disease ([place]) DX   2005   NEUROLOGIST-   DR TAT--  IDIOPATHIC PARKINSON'S /  TREMORS CONTROLLED WITH MEDS  . Urge urinary incontinence     Past Surgical History:  Procedure Laterality Date  . CATARACT EXTRACTION W/ INTRAOCULAR LENS  IMPLANT, BILATERAL  2014  . CYSTOSCOPY W/ RETROGRADES Bilateral 12/14/2014   Procedure: CYSTOSCOPY BLADDER BIOPSY FULGERATION MITOMYCIN C BILATERAL RETROGRADE PYELOGRAM;  Surgeon: Festus Aloe, MD;  Location: [facility];  Service: Urology;  Laterality: Bilateral;  . CYSTOSCOPY WITH STENT PLACEMENT Left 12/19/2013   Procedure:  CYSTOSCOPY BLADDER BX, LEFT URETERAL STENT PLACEMENT , LEFT RETROGRADE AND PYLOGRAM;  Surgeon: Festus Aloe, MD;  Location: [facility];  Service: Urology;  Laterality: Left;  . INGUINAL HERNIA REPAIR  03/09/2012   Procedure: HERNIA REPAIR INGUINAL ADULT;  Surgeon: Adin Hector, MD;  Location: [facility];  Service: General;  Laterality: Right;  . INSERTION OF MESH  03/09/2012   Procedure: INSERTION OF MESH;  Surgeon: Adin Hector, MD;  Location: [facility];  Service: General;  Laterality: Right;  . NASAL SINUS SURGERY  1982  . ORIF RIGHT ARM FX  1949    HARDWARE REMOVED   . REMOVAL BENIGN CYST LEFT FOREHEAD  1969  . TRANSURETHRAL RESECTION OF BLADDER TUMOR N/A 12/19/2013   Procedure:  TRANSURETHRAL RESECTION OF BLADDER TUMOR WITH GYRUS (TURBT-GYRUS);  Surgeon: Festus Aloe, MD;  Location: [facility];  Service: Urology;  Laterality: N/A;    Social History   Social History  . Marital status: Married    Spouse name: [name]  . Number of children: 1  . Years of education: N/A   Occupational History  . retired 1995, worked in [place] , finances     Social History Main Topics  . Smoking status: Former Smoker    Packs/day: 1.00    Years: 15.00    Types: Cigarettes    Quit date: 04/20/1968  . Smokeless tobacco: Never Used  . Alcohol use 4.2 oz/week    7 Glasses of wine per week     Comment: ONE WINE PER DAY  . Drug use: No  . Sexual activity: Not on file   Other Topics Concern  . Not on file   Social History Narrative   Lives w/ wife   Has a Daughter 34 y/o     Family History  Problem Relation Age of Onset  . Alzheimer's disease Mother   . Liver cancer Father   . Cancer Father     colon  . Prostate cancer Neg Hx   . CAD Neg Hx      Allergies as of 05/12/2016   No Known Allergies     Medication List       Accurate as of 05/12/16  7:22 PM. Always use your most recent med list.          aspirin 81 MG tablet Take 2 tablets (162 mg total) by mouth daily.   atorvastatin 40 MG tablet Commonly known as:  LIPITOR Take 1 tablet (40 mg total) by mouth daily.   carbidopa-levodopa 50-200 MG tablet Commonly known as:  SINEMET CR Take 1 tablet by mouth at bedtime.   carbidopa-levodopa 25-100 MG tablet Commonly known as:  SINEMET IR TAKE TWO TABLETS BY MOUTH FOUR TIMES DAILY   diclofenac sodium 1 % Gel Commonly known as:  VOLTAREN Apply topically as needed.   fexofenadine 180 MG tablet Commonly known as:  ALLEGRA Take 180 mg by mouth daily.   fluorouracil 5 % cream Commonly known as:   EFUDEX Apply topically 2 (two) times daily.   fluticasone 50 MCG/ACT nasal spray Commonly known as:  FLONASE Place 2 sprays into both nostrils daily.   losartan 100 MG tablet Commonly known as:  COZAAR Take 1 tablet (100 mg total) by mouth daily.   mirtazapine 15 MG tablet Commonly known as:  REMERON TAKE 1 TABLET (15 MG TOTAL) BY MOUTH AT BEDTIME.   MYRBETRIQ 25 MG Tb24 tablet Generic drug:  mirabegron ER   NONFORMULARY OR COMPOUNDED ITEM  [facility]:  Urea 40%, apply daily to affected areas.   oxyCODONE-acetaminophen 5-325 MG tablet Commonly known as:  PERCOCET/ROXICET Take 1 tablet by mouth every 8 (eight) hours as needed. For back pain   polyethylene glycol packet Commonly known as:  MIRALAX / GLYCOLAX Take 17 g by mouth daily as needed. For laxative   PROBIOTIC DAILY PO Take 1 capsule by mouth daily.   ROLAIDS PO Take by mouth as needed.   tamsulosin 0.4 MG Caps capsule Commonly known as:  FLOMAX Take 0.4 mg by mouth daily.   Zoster Vaccine Live (PF) 19400 UNT/0.65ML injection Commonly known as:  ZOSTAVAX Inject 19,400 Units into the skin once.          Objective:   Physical Exam BP 126/72 (BP Location: Left Arm, Patient Position: Sitting, Cuff Size: Normal)   Pulse 66   Temp 97.2 F (36.2 C) (Oral)   Resp 14   Ht 5\' 11"  (1.803 m)   Wt 175 lb 6 oz (79.5 kg)   SpO2 96%   BMI 24.46 kg/m   General:   Well developed, well nourished . NAD.  Neck: No  thyromegaly  HEENT:  Normocephalic . Face symmetric, atraumatic Lungs:  CTA B Normal respiratory effort, no intercostal retractions, no accessory muscle use. Heart: RRR,  no murmur.  Abdomen:  Not distended, soft, non-tender. No rebound or rigidity.  Lower extremities: Knees: Symmetric, range of motion normal, no red or swollen Ankles, range of motion normal, no TTP, no red or swollen. He has significant lower extremity edema, pitting, much worse on the L, calf circumference is 2 inches  larger on the left . Calves not tender to palpation. Good pedal pulses bilaterally Skin: Exposed areas without rash. Not pale. Not jaundice Neurologic:  alert & oriented X3.  Speech normal, gait appropriate for age and unassisted Strength symmetric and appropriate for age.  Psych: Cognition and judgment appear intact.  Cooperative with normal attention span and concentration.  Behavior appropriate. No anxious or depressed appearing.    Assessment & Plan:   Assessment HTN Hyperlipidemia Depression - anxiety OSA, mild, no CPAP Parkinson disease Dr. Carles Collet Dementia, mild GU: Dr. Junious Silk --BPH, elevated PSA --Bladder cancer-low-grade --OAB -- E.D. Dysphagia, MBE 09-2013 normal Dry mouth  DJD- back pain, sees Dr Nelva Bush, s/p local injections, on prn oxycodone HOH   Plan: HTN: Seems well-controlled on losartan. Checking labs. High cholesterol: On Lipitor, check labs Depression anxiety: Since the last visit Lexapro was stopped, now on Remeron. Feeling well. Parkinson's, frequent falls: Per neurology, I recommend formal PT to prevent future falls, patient declined it. DJD: History of DJD, taking ibuprofen OTC 4 tablets a day. Also oxycodone from time to time. Discussed risk of PUD with NSAIDs, recommend to switch to Tylenol 500 mg and use oxycodone sporadically. Previously prescribed by Dr. Nelva Bush, I'll prescribe a small amount today. Lower extremity edema: Must rule out DVT, check ultrasound today RTC 4 weeks for re-eval  Today, In addition to a CPX, I spent more than  25  min with the patient: >50% of the time counseling regards his chronic medical problems including frequent falls, and acute problem (edema) the patient was somewhat reluctant to proceed with my advice, I carefully discussed with him the risk of injury if you continue following, the benefits of formal PT for prevention.

## 2016-05-12 NOTE — Patient Instructions (Signed)
GO TO THE LAB : Get the blood work     GO TO THE FRONT DESK Schedule your next appointment for a  checkup in 4 weeks Please schedule a Medicare wellness with one of our nurses at your earliest convenience  We are doing a ultrasound of the legs to rule out clot ( today or tomorrow morning)  For pain: Stop Advil, it may cause ulcers and gastritis Tylenol  500 mg OTC 2 tabs a day every 8 hours as needed for pain You can also take from time to time oxycodone, watch for drowsiness

## 2016-05-12 NOTE — Progress Notes (Signed)
Pre visit review using our clinic review tool, if applicable. No additional management support is needed unless otherwise documented below in the visit note. 

## 2016-05-12 NOTE — Assessment & Plan Note (Signed)
HTN: Seems well-controlled on losartan. Checking labs. High cholesterol: On Lipitor, check labs Depression anxiety: Since the last visit Lexapro was stopped, now on Remeron. Feeling well. Parkinson's, frequent falls: Per neurology, I recommend formal PT to prevent future falls, patient declined it. DJD: History of DJD, taking ibuprofen OTC 4 tablets a day. Also oxycodone from time to time. Discussed risk of PUD with NSAIDs, recommend to switch to Tylenol 500 mg and use oxycodone sporadically. Previously prescribed by Dr. Nelva Bush, I'll prescribe a small amount today. Lower extremity edema: Must rule out DVT, check ultrasound today RTC 4 weeks for re-eval

## 2016-05-13 ENCOUNTER — Ambulatory Visit (HOSPITAL_BASED_OUTPATIENT_CLINIC_OR_DEPARTMENT_OTHER)
Admission: RE | Admit: 2016-05-13 | Discharge: 2016-05-13 | Disposition: A | Discharge status: Home / Self Care | Payer: Medicare Other | Source: Ambulatory Visit | Attending: Internal Medicine | Admitting: Internal Medicine

## 2016-05-13 ENCOUNTER — Encounter: Payer: Self-pay | Admitting: Internal Medicine

## 2016-05-13 DIAGNOSIS — R600 Localized edema: Secondary | ICD-10-CM | POA: Diagnosis not present

## 2016-05-13 DIAGNOSIS — M7121 Synovial cyst of popliteal space [Baker], right knee: Secondary | ICD-10-CM | POA: Insufficient documentation

## 2016-05-13 DIAGNOSIS — R609 Edema, unspecified: Secondary | ICD-10-CM | POA: Diagnosis not present

## 2016-05-13 LAB — BASIC METABOLIC PANEL
BUN: 22 mg/dL (ref 6–23)
CHLORIDE: 111 meq/L (ref 96–112)
CO2: 26 meq/L (ref 19–32)
Calcium: 8.9 mg/dL (ref 8.4–10.5)
Creatinine, Ser: 1 mg/dL (ref 0.40–1.50)
GFR: 76.4 mL/min (ref >60.00)
Glucose, Bld: 86 mg/dL (ref 70–99)
POTASSIUM: 4 meq/L (ref 3.5–5.1)
Sodium: 142 mEq/L (ref 135–145)

## 2016-05-13 LAB — CBC WITH DIFFERENTIAL/PLATELET
BASOS ABS: 0 10*3/uL (ref 0.0–0.1)
BASOS PCT: 0.7 % (ref 0.0–3.0)
EOS ABS: 0.5 10*3/uL (ref 0.0–0.7)
Eosinophils Relative: 7.9 % — ABNORMAL HIGH (ref 0.0–5.0)
HEMATOCRIT: 37 % — AB (ref 39.0–52.0)
Hemoglobin: 12.3 g/dL — ABNORMAL LOW (ref 13.0–17.0)
LYMPHS PCT: 23.9 % (ref 12.0–46.0)
Lymphs Abs: 1.6 10*3/uL (ref 0.7–4.0)
MCHC: 33.3 g/dL (ref 30.0–36.0)
MCV: 91.7 fl (ref 78.0–100.0)
MONO ABS: 0.5 10*3/uL (ref 0.1–1.0)
Monocytes Relative: 7.5 % (ref 3.0–12.0)
Neutro Abs: 4 10*3/uL (ref 1.4–7.7)
Neutrophils Relative %: 60 % (ref 43.0–77.0)
Platelets: 204 10*3/uL (ref 150.0–400.0)
RBC: 4.03 Mil/uL — AB (ref 4.22–5.81)
RDW: 14 % (ref 11.5–15.5)
WBC: 6.6 10*3/uL (ref 4.0–10.5)

## 2016-05-13 LAB — LIPID PANEL
Cholesterol: 100 mg/dL (ref 0–200)
HDL: 47 mg/dL (ref >39.00)
LDL Cholesterol: 46 mg/dL (ref 0–99)
NonHDL: 52.69
TRIGLYCERIDES: 35 mg/dL (ref 0.0–149.0)
Total CHOL/HDL Ratio: 2
VLDL: 7 mg/dL (ref 0.0–40.0)

## 2016-05-13 LAB — ALT: ALT: 2 U/L (ref 0–53)

## 2016-05-13 LAB — AST: AST: 8 U/L (ref 0–37)

## 2016-05-13 NOTE — Addendum Note (Signed)
Addended byDamita Dunnings D on: 05/13/2016 08:52 AM   Modules accepted: Orders

## 2016-05-15 ENCOUNTER — Other Ambulatory Visit (INDEPENDENT_AMBULATORY_CARE_PROVIDER_SITE_OTHER): Payer: Medicare Other

## 2016-05-15 DIAGNOSIS — D649 Anemia, unspecified: Secondary | ICD-10-CM

## 2016-05-15 LAB — FERRITIN: FERRITIN: 119.6 ng/mL (ref 22.0–322.0)

## 2016-05-15 LAB — IRON: IRON: 71 ug/dL (ref 42–165)

## 2016-06-09 ENCOUNTER — Encounter: Payer: Self-pay | Admitting: Podiatry

## 2016-06-09 ENCOUNTER — Ambulatory Visit (INDEPENDENT_AMBULATORY_CARE_PROVIDER_SITE_OTHER): Payer: Medicare Other | Admitting: Podiatry

## 2016-06-09 DIAGNOSIS — M79674 Pain in right toe(s): Secondary | ICD-10-CM

## 2016-06-09 DIAGNOSIS — B351 Tinea unguium: Secondary | ICD-10-CM

## 2016-06-09 DIAGNOSIS — M79675 Pain in left toe(s): Secondary | ICD-10-CM

## 2016-06-10 ENCOUNTER — Ambulatory Visit (INDEPENDENT_AMBULATORY_CARE_PROVIDER_SITE_OTHER): Payer: Medicare Other | Admitting: Internal Medicine

## 2016-06-10 ENCOUNTER — Encounter: Payer: Self-pay | Admitting: Internal Medicine

## 2016-06-10 VITALS — BP 124/70 | HR 66 | Temp 97.5°F | Resp 12 | Ht 71.0 in | Wt 175.5 lb

## 2016-06-10 DIAGNOSIS — M549 Dorsalgia, unspecified: Secondary | ICD-10-CM

## 2016-06-10 DIAGNOSIS — G8929 Other chronic pain: Secondary | ICD-10-CM | POA: Diagnosis not present

## 2016-06-10 DIAGNOSIS — G20 Parkinson's disease: Secondary | ICD-10-CM

## 2016-06-10 DIAGNOSIS — R600 Localized edema: Secondary | ICD-10-CM

## 2016-06-10 DIAGNOSIS — G20A1 Parkinson's disease without dyskinesia, without mention of fluctuations: Secondary | ICD-10-CM

## 2016-06-10 MED ORDER — FUROSEMIDE 20 MG PO TABS: 20.0000 mg | ORAL_TABLET | Freq: Every day | ORAL | 0 refills | Status: DC

## 2016-06-10 MED ORDER — POTASSIUM CHLORIDE ER 8 MEQ PO CPCR: 8.0000 meq | ORAL_CAPSULE | Freq: Every day | ORAL | 0 refills | Status: DC

## 2016-06-10 MED ORDER — ZOSTER VAC RECOMB ADJUVANTED 50 MCG/0.5ML IM SUSR: 1.0000 | Freq: Once | INTRAMUSCULAR | 0 refills | Status: AC

## 2016-06-10 MED ORDER — HYDROCODONE-ACETAMINOPHEN 5-325 MG PO TABS: 1.0000 | ORAL_TABLET | Freq: Three times a day (TID) | ORAL | 0 refills | Status: DC

## 2016-06-10 NOTE — Progress Notes (Signed)
Subjective:    Patient ID: [name], male    DOB: 05-May-1933, 83 y.o.   MRN: [id number]  DOS:  06/10/2016 Type of visit - description : Follow-up from previous visit Interval history: Edema: Is about the same DJD: He is taking Tylenol instead of NSAIDs, similar results. When he has moderate to severe back pain takes oxycodone, helped sometimes make him very sleepy. Alternatives?   Review of Systems   Past Medical History:  Diagnosis Date  . Allergic rhinitis   . Bladder cancer [facility])    urologist-  dr eskridge--  low grade TA  . BPH with elevated PSA   . DDD (degenerative disc disease), lumbar   . Depression   . Dry mouth    USES BIOTIN SPRAY/ MOUTHWASH  . Frequency of urination   . GERD (gastroesophageal reflux disease)   . Hearing loss    does not wear his hearing aid  . Hyperlipidemia   . Hypertension   . Mild obstructive sleep apnea    PER PT  MILD OSA , NO CPAP RX,  STUDY DONE 2009  . Nocturia   . OAB (overactive bladder)   . Osteoarthritis    KNEES, SHOULDER  . Parkinson disease ([place]) DX   2005   NEUROLOGIST-   DR TAT--  IDIOPATHIC PARKINSON'S /  TREMORS CONTROLLED WITH MEDS  . Urge urinary incontinence     Past Surgical History:  Procedure Laterality Date  . CATARACT EXTRACTION W/ INTRAOCULAR LENS  IMPLANT, BILATERAL  2014  . CYSTOSCOPY W/ RETROGRADES Bilateral 12/14/2014   Procedure: CYSTOSCOPY BLADDER BIOPSY FULGERATION MITOMYCIN C BILATERAL RETROGRADE PYELOGRAM;  Surgeon: Festus Aloe, MD;  Location: [facility];  Service: Urology;  Laterality: Bilateral;  . CYSTOSCOPY WITH STENT PLACEMENT Left 12/19/2013   Procedure: CYSTOSCOPY BLADDER BX, LEFT URETERAL STENT PLACEMENT , LEFT RETROGRADE AND PYLOGRAM;  Surgeon: Festus Aloe, MD;  Location: [facility];  Service: Urology;  Laterality: Left;  . INGUINAL HERNIA REPAIR  03/09/2012   Procedure: HERNIA REPAIR INGUINAL ADULT;  Surgeon: Adin Hector, MD;  Location[facility];   Service: General;  Laterality: Right;  . INSERTION OF MESH  03/09/2012   Procedure: INSERTION OF MESH;  Surgeon: Adin Hector, MD;  Location[facility];  Service: General;  Laterality: Right;  . NASAL SINUS SURGERY  1982  . ORIF RIGHT ARM FX  1949   HARDWARE REMOVED   . REMOVAL BENIGN CYST LEFT FOREHEAD  1969  . TRANSURETHRAL RESECTION OF BLADDER TUMOR N/A 12/19/2013   Procedure:  TRANSURETHRAL RESECTION OF BLADDER TUMOR WITH GYRUS (TURBT-GYRUS);  Surgeon: Festus Aloe, MD;  Location[facility];  Service: Urology;  Laterality: N/A;    Social History   Social History  . Marital status: Married    Spouse name: [name]  . Number of children: 1  . Years of education: N/A   Occupational History  . retired 1995, worked in [place] , finances     Social History Main Topics  . Smoking status: Former Smoker    Packs/day: 1.00    Years: 15.00    Types: Cigarettes    Quit date: 04/20/1968  . Smokeless tobacco: Never Used  . Alcohol use 4.2 oz/week    7 Glasses of wine per week     Comment: ONE WINE PER DAY  . Drug use: No  . Sexual activity: Not on file   Other Topics Concern  . Not on file   Social History  Narrative   Lives w/ wife   Has a Daughter 69 y/o      Allergies as of 06/10/2016   No Known Allergies     Medication List       Accurate as of 06/10/16 11:59 PM. Always use your most recent med list.          acetaminophen 500 MG tablet Commonly known as:  TYLENOL Take 1,000 mg by mouth every 8 (eight) hours as needed.   aspirin 81 MG tablet Take 2 tablets (162 mg total) by mouth daily.   atorvastatin 40 MG tablet Commonly known as:  LIPITOR Take 1 tablet (40 mg total) by mouth daily.   carbidopa-levodopa 50-200 MG tablet Commonly known as:  SINEMET CR Take 1 tablet by mouth at bedtime.   carbidopa-levodopa 25-100 MG tablet Commonly known as:  SINEMET IR TAKE TWO TABLETS BY MOUTH FOUR TIMES DAILY   diclofenac sodium 1 % Gel Commonly  known as:  VOLTAREN Apply topically as needed.   fexofenadine 180 MG tablet Commonly known as:  ALLEGRA Take 180 mg by mouth daily.   fluorouracil 5 % cream Commonly known as:  EFUDEX Apply topically 2 (two) times daily.   fluticasone 50 MCG/ACT nasal spray Commonly known as:  FLONASE Place 2 sprays into both nostrils daily.   furosemide 20 MG tablet Commonly known as:  LASIX Take 1 tablet (20 mg total) by mouth daily.   HYDROcodone-acetaminophen 5-325 MG tablet Commonly known as:  NORCO/VICODIN Take 1-2 tablets by mouth 3 (three) times daily.   losartan 100 MG tablet Commonly known as:  COZAAR Take 1 tablet (100 mg total) by mouth daily.   mirtazapine 15 MG tablet Commonly known as:  REMERON TAKE 1 TABLET (15 MG TOTAL) BY MOUTH AT BEDTIME.   MYRBETRIQ 25 MG Tb24 tablet Generic drug:  mirabegron ER   NONFORMULARY OR COMPOUNDED ITEM [facility]:  Urea 40%, apply daily to affected areas.   polyethylene glycol packet Commonly known as:  MIRALAX / GLYCOLAX Take 17 g by mouth daily as needed. For laxative   Potassium Chloride CR 8 MEQ Cpcr capsule CR Commonly known as:  MICRO-K Take 1 capsule (8 mEq total) by mouth daily.   PROBIOTIC DAILY PO Take 1 capsule by mouth daily.   ROLAIDS PO Take by mouth as needed.   tamsulosin 0.4 MG Caps capsule Commonly known as:  FLOMAX Take 0.4 mg by mouth daily.   Zoster Vac Recomb Adjuvanted 50 MCG Susr Commonly known as:  SHINGRIX Inject 1 Dose into the muscle once.          Objective:   Physical Exam BP 124/70 (BP Location: Left Arm, Patient Position: Sitting, Cuff Size: Normal)   Pulse 66   Temp 97.5 F (36.4 C) (Oral)   Resp 12   Ht 5\' 11"  (1.803 m)   Wt 175 lb 8 oz (79.6 kg)   SpO2 97%   BMI 24.48 kg/m  General:   Well developed, well nourished . NAD.  HEENT:  Normocephalic . Face symmetric, atraumatic Lungs:  CTA B Normal respiratory effort, no intercostal retractions, no accessory muscle  use. Heart: RRR,  no murmur.  Lower extremities: Edema seems to be the same, left calf is larger, no TTP or swelling at the popliteal area on either side. Skin: Not pale. Not jaundice Neurologic:  alert & oriented X3.  Speech normal Psych--  Cognition and judgment appear intact.  Cooperative with normal attention span and concentration.  Behavior appropriate.  No anxious or depressed appearing.      Assessment & Plan:    Assessment HTN Hyperlipidemia Depression - anxiety OSA, mild, no CPAP Parkinson disease Dr. Carles Collet Dementia, mild GU: Dr. Junious Silk --BPH, elevated PSA --Bladder cancer-low-grade --OAB -- E.D. Dysphagia, MBE 09-2013 normal Dry mouth  DJD- back pain, sees Dr Nelva Bush, s/p local injections, on prn oxycodone L > R LE edema: [place] (-) DVT 04-2016 HOH   Plan: LE edema: Is about the same, the [place] was (-) for DVT, 3.9 cm Bakers cyst, posterior right knee. Plan: Lasix 20, KCl 10 one tablet daily, BMP in 2 weeks. Parkinson's, frequent falls: Having problems with ADLs, getting a shower, dressing and sometimes self-feeding. Patient will call if needs a letter of support to help his  LTC insurance to cover some expenses. Back pain:  See last visit,   switched NSAIDs to Tylenol, it helps about the same thus will stay on tylenol. For the remaining pain, takes  oxycodone , it works but makes him sleepy. Plan: Recommend to see Dr. Nelva Bush, continue Tylenol, change to Vicodin (hopefuly will be less sedating). Patient aware that Vicodin contains Tylenol. See instructions Request a prescription for shingrix, rx printed, booster in  in 3-6 months  RTC: 2 weeks for a BMP, 3 months for routine checkup   Today, I spent more than  35  min with the patientAnd his wife who was here: >50% of the time counseling regards frequent falls, the need to send a letter for his LTC insurance, also discussing pain management. Multiple questions from the patient and wife answered to the best of my ability,  he again described all his symptoms and concerns meticulously

## 2016-06-10 NOTE — Progress Notes (Signed)
Subjective: 83 y.o. returns the office today for painful, elongated, thickened toenails which he cannot trim himself. Denies any redness or drainage around the nails. Denies any acute changes since last appointment and no new complaints today. Denies any systemic complaints such as fevers, chills, nausea, vomiting.   Objective: AAO 3, NAD DP/PT pulses palpable, CRT less than 3 seconds  Nails hypertrophic, dystrophic, elongated, brittle, discolored 10. There is tenderness overlying the nails 1-5 bilaterally. There is no surrounding erythema or drainage along the nail sites. No open lesions or pre-ulcerative lesions are identified. No other areas of tenderness bilateral lower extremities. No overlying edema, erythema, increased warmth. No pain with calf compression, swelling, warmth, erythema.  Assessment: Patient presents with symptomatic onychomycosis  Plan: -Treatment options including alternatives, risks, complications were discussed -Nails sharply debrided 10 without complication/bleeding. -He is going to continue with the compound cream for onychomycosis. He has a refill still and he is going to call and try to get filled -Discussed daily foot inspection. If there are any changes, to call the office immediately.  -Follow-up in 3 months or sooner if any problems are to arise. In the meantime, encouraged to call the office with any questions, concerns, changes symptoms.  Celesta Gentile, DPM

## 2016-06-10 NOTE — Patient Instructions (Addendum)
Please schedule in the following:  Lab appointment in 2 weeks to check a BMP  Routine follow-up with me in 3 months  =============== For swelling: Leg elevation Take Lasix and potassium one tablet of each every morning.  For pain: You can take pain medication 3 times a day. If the pain is mild to moderate, take Tylenol 500 mg 2 tablets If the pain is moderate to severe: Don't take Tylenol, take hydrocodone 1 or 2 tablets instead ( Vicodin has hydrocodone and  Tylenol on it)

## 2016-06-10 NOTE — Progress Notes (Signed)
Pre visit review using our clinic review tool, if applicable. No additional management support is needed unless otherwise documented below in the visit note. 

## 2016-06-11 NOTE — Assessment & Plan Note (Signed)
LE edema: Is about the same, the [place] was (-) for DVT, 3.9 cm Bakers cyst, posterior right knee. Plan: Lasix 20, KCl 10 one tablet daily, BMP in 2 weeks. Parkinson's, frequent falls: Having problems with ADLs, getting a shower, dressing and sometimes self-feeding. Patient will call if needs a letter of support to help his  LTC insurance to cover some expenses. Back pain:  See last visit,   switched NSAIDs to Tylenol, it helps about the same thus will stay on tylenol. For the remaining pain, takes  oxycodone , it works but makes him sleepy. Plan: Recommend to see Dr. Nelva Bush, continue Tylenol, change to Vicodin (hopefuly will be less sedating). Patient aware that Vicodin contains Tylenol. See instructions Request a prescription for shingrix, rx printed, booster in  in 3-6 months  RTC: 2 weeks for a BMP, 3 months for routine checkup

## 2016-06-16 ENCOUNTER — Ambulatory Visit: Payer: Medicare Other | Admitting: Podiatry

## 2016-06-24 ENCOUNTER — Encounter: Payer: Self-pay | Admitting: Internal Medicine

## 2016-06-24 ENCOUNTER — Ambulatory Visit (INDEPENDENT_AMBULATORY_CARE_PROVIDER_SITE_OTHER): Payer: Medicare Other | Admitting: Internal Medicine

## 2016-06-24 ENCOUNTER — Other Ambulatory Visit: Payer: Medicare Other

## 2016-06-24 VITALS — BP 124/74 | HR 83 | Temp 98.3°F | Resp 14 | Ht 71.0 in | Wt 175.4 lb

## 2016-06-24 DIAGNOSIS — I10 Essential (primary) hypertension: Secondary | ICD-10-CM | POA: Diagnosis not present

## 2016-06-24 DIAGNOSIS — F419 Anxiety disorder, unspecified: Secondary | ICD-10-CM

## 2016-06-24 DIAGNOSIS — R600 Localized edema: Secondary | ICD-10-CM

## 2016-06-24 NOTE — Progress Notes (Signed)
Pre visit review using our clinic review tool, if applicable. No additional management support is needed unless otherwise documented below in the visit note. 

## 2016-06-24 NOTE — Progress Notes (Signed)
Subjective:    Patient ID: [name], male    DOB: 1933/10/04, 83 y.o.   MRN: [id number]  DOS:  06/24/2016 Type of visit - description : acute, here with his wife Interval history:  Patient wake up today feeling at baseline, after breakfast he developed a "overwhelming anxiety" like a "buzzing feeling" throughout his body and was quivering inside. Symptoms are gradually decreasing and is now almost normal. He feels that could be related to Lasix that he  started it around 2 weeks ago.  Continue with edema, not much better despite starting Lasix. He is trying to elevate his legs when he can. Already avoiding salt. He checks his BPs from time to time and has never noticed low readings  Wt Readings from Last 3 Encounters:  06/24/16 175 lb 6 oz (79.5 kg)  06/10/16 175 lb 8 oz (79.6 kg)  05/12/16 175 lb 6 oz (79.5 kg)     Review of Systems Denies chest pain or difficulty breathing No depression + Stress, he is planning to get some help for the daytime he is somewhat anxious about that. No head injury No seizures, bladder or bowel incontinence. Denies any focal deficits, slurred speech or facial numbness.  Past Medical History:  Diagnosis Date  . Allergic rhinitis   . Bladder cancer [facility])    urologist-  dr eskridge--  low grade TA  . BPH with elevated PSA   . DDD (degenerative disc disease), lumbar   . Depression   . Dry mouth    USES BIOTIN SPRAY/ MOUTHWASH  . Frequency of urination   . GERD (gastroesophageal reflux disease)   . Hearing loss    does not wear his hearing aid  . Hyperlipidemia   . Hypertension   . Mild obstructive sleep apnea    PER PT  MILD OSA , NO CPAP RX,  STUDY DONE 2009  . Nocturia   . OAB (overactive bladder)   . Osteoarthritis    KNEES, SHOULDER  . Parkinson disease ([place]) DX   2005   NEUROLOGIST-   DR TAT--  IDIOPATHIC PARKINSON'S /  TREMORS CONTROLLED WITH MEDS  . Urge urinary incontinence     Past Surgical History:  Procedure Laterality  Date  . CATARACT EXTRACTION W/ INTRAOCULAR LENS  IMPLANT, BILATERAL  2014  . CYSTOSCOPY W/ RETROGRADES Bilateral 12/14/2014   Procedure: CYSTOSCOPY BLADDER BIOPSY FULGERATION MITOMYCIN C BILATERAL RETROGRADE PYELOGRAM;  Surgeon: Festus Aloe, MD;  Location: [facility];  Service: Urology;  Laterality: Bilateral;  . CYSTOSCOPY WITH STENT PLACEMENT Left 12/19/2013   Procedure: CYSTOSCOPY BLADDER BX, LEFT URETERAL STENT PLACEMENT , LEFT RETROGRADE AND PYLOGRAM;  Surgeon: Festus Aloe, MD;  Location: [facility];  Service: Urology;  Laterality: Left;  . INGUINAL HERNIA REPAIR  03/09/2012   Procedure: HERNIA REPAIR INGUINAL ADULT;  Surgeon: Adin Hector, MD;  Location: [facility];  Service: General;  Laterality: Right;  . INSERTION OF MESH  03/09/2012   Procedure: INSERTION OF MESH;  Surgeon: Adin Hector, MD;  Location: [facility];  Service: General;  Laterality: Right;  . NASAL SINUS SURGERY  1982  . ORIF RIGHT ARM FX  1949   HARDWARE REMOVED   . REMOVAL BENIGN CYST LEFT FOREHEAD  1969  . TRANSURETHRAL RESECTION OF BLADDER TUMOR N/A 12/19/2013   Procedure:  TRANSURETHRAL RESECTION OF BLADDER TUMOR WITH GYRUS (TURBT-GYRUS);  Surgeon: Festus Aloe, MD;  Location: [facility];  Service: Urology;  Laterality: N/A;  Social History   Social History  . Marital status: Married    Spouse name: [name]  . Number of children: 1  . Years of education: N/A   Occupational History  . retired 1995, worked in [place] , finances     Social History Main Topics  . Smoking status: Former Smoker    Packs/day: 1.00    Years: 15.00    Types: Cigarettes    Quit date: 04/20/1968  . Smokeless tobacco: Never Used  . Alcohol use 4.2 oz/week    7 Glasses of wine per week     Comment: ONE WINE PER DAY  . Drug use: No  . Sexual activity: Not on file   Other Topics Concern  . Not on file   Social History Narrative   Lives w/ wife   Has a Daughter 54 y/o        Allergies as of 06/24/2016   No Known Allergies     Medication List       Accurate as of 06/24/16 11:59 PM. Always use your most recent med list.          acetaminophen 500 MG tablet Commonly known as:  TYLENOL Take 1,000 mg by mouth every 8 (eight) hours as needed.   aspirin 81 MG tablet Take 2 tablets (162 mg total) by mouth daily.   atorvastatin 40 MG tablet Commonly known as:  LIPITOR Take 1 tablet (40 mg total) by mouth daily.   carbidopa-levodopa 50-200 MG tablet Commonly known as:  SINEMET CR Take 1 tablet by mouth at bedtime.   carbidopa-levodopa 25-100 MG tablet Commonly known as:  SINEMET IR TAKE TWO TABLETS BY MOUTH FOUR TIMES DAILY   diclofenac sodium 1 % Gel Commonly known as:  VOLTAREN Apply topically as needed.   fexofenadine 180 MG tablet Commonly known as:  ALLEGRA Take 180 mg by mouth daily.   fluorouracil 5 % cream Commonly known as:  EFUDEX Apply topically 2 (two) times daily.   fluticasone 50 MCG/ACT nasal spray Commonly known as:  FLONASE Place 2 sprays into both nostrils daily.   furosemide 20 MG tablet Commonly known as:  LASIX Take 1 tablet (20 mg total) by mouth daily.   HYDROcodone-acetaminophen 5-325 MG tablet Commonly known as:  NORCO/VICODIN Take 1-2 tablets by mouth 3 (three) times daily.   losartan 100 MG tablet Commonly known as:  COZAAR Take 1 tablet (100 mg total) by mouth daily.   mirtazapine 15 MG tablet Commonly known as:  REMERON TAKE 1 TABLET (15 MG TOTAL) BY MOUTH AT BEDTIME.   MYRBETRIQ 25 MG Tb24 tablet Generic drug:  mirabegron ER   NONFORMULARY OR COMPOUNDED ITEM [facility]:  Urea 40%, apply daily to affected areas.   polyethylene glycol packet Commonly known as:  MIRALAX / GLYCOLAX Take 17 g by mouth daily as needed. For laxative   Potassium Chloride CR 8 MEQ Cpcr capsule CR Commonly known as:  MICRO-K Take 1 capsule (8 mEq total) by mouth daily.   PROBIOTIC DAILY PO Take 1 capsule  by mouth daily.   ROLAIDS PO Take by mouth as needed.   tamsulosin 0.4 MG Caps capsule Commonly known as:  FLOMAX Take 0.4 mg by mouth daily.          Objective:   Physical Exam BP 124/74 (BP Location: Left Arm, Patient Position: Sitting, Cuff Size: Normal)   Pulse 83   Temp 98.3 F (36.8 C) (Oral)   Resp 14   Ht 5\' 11"  (  1.803 m)   Wt 175 lb 6 oz (79.5 kg)   SpO2 97%   BMI 24.46 kg/m  General:   Well developed, well nourished . NAD.  HEENT:  Normocephalic . Face symmetric, atraumatic Lungs:  CTA B Normal respiratory effort, no intercostal retractions, no accessory muscle use. Heart: RRR,  no murmur.  + Pitting edema, at the lower extremities, more noticeable from the mid pretibial area down   Skin: Not pale. Not jaundice Neurologic:  alert & oriented X3.  Speech normal  Psych--  Cognition and judgment appear intact.  Cooperative with normal attention span and concentration.  Behavior appropriate. No anxious or depressed appearing.      Assessment & Plan:   Assessment HTN Hyperlipidemia Depression - anxiety OSA, mild, no CPAP Parkinson disease Dr. Carles Collet Dementia, mild GU: Dr. Junious Silk --BPH, elevated PSA --Bladder cancer-low-grade --OAB -- E.D. Dysphagia, MBE 09-2013 normal Dry mouth  DJD- back pain, sees Dr Nelva Bush, s/p local injections, on prn oxycodone L > R LE edema: [place] (-) DVT 04-2016 HOH   PLAN: Anxiety: Anxiety associated with other sx see HPI, sx are  resolving.  Unclear etiology : no stroke, SZs,  low BPs or other serious conditions on clinical grounds. For now we agreed on observation  Edema:  edema is about the same , swelling is  likely dependent, best solution would be low salt, leg elevation and compression stockings or Ace wrapping which would be easier for him . After a long conversation and listening to his concerns we agreed to continue the same dose of Lasix ( increase Lasix dose might help but will also be a  potential source for other  problems) and Ace wrapping in the morning.  Today, I spent more than  26  min with the patient: >50% of the time counseling regards edema management, why high doses of Lasix are not a good idea, listening carefully to all his concerns.

## 2016-06-24 NOTE — Patient Instructions (Signed)
Go to the lab  Continue taking Lasix and potassium, call for a refill when needed  Continue leg elevation  Watch your salt intake  Ace wrap your legs up to the knee daily early in the morning  Call next week, let me know how you're doing

## 2016-06-25 LAB — BASIC METABOLIC PANEL
BUN: 23 mg/dL (ref 6–23)
CALCIUM: 9.4 mg/dL (ref 8.4–10.5)
CO2: 26 mEq/L (ref 19–32)
Chloride: 106 mEq/L (ref 96–112)
Creatinine, Ser: 1.02 mg/dL (ref 0.40–1.50)
GFR: 74.65 mL/min (ref >60.00)
GLUCOSE: 100 mg/dL — AB (ref 70–99)
POTASSIUM: 4.5 meq/L (ref 3.5–5.1)
Sodium: 140 mEq/L (ref 135–145)

## 2016-06-25 NOTE — Assessment & Plan Note (Signed)
Anxiety: Anxiety associated with other sx see HPI, sx are  resolving.  Unclear etiology : no stroke, SZs,  low BPs or other serious conditions on clinical grounds. For now we agreed on observation  Edema:  edema is about the same , swelling is  likely dependent, best solution would be low salt, leg elevation and compression stockings or Ace wrapping which would be easier for him . After a long conversation and listening to his concerns we agreed to continue the same dose of Lasix ( increase Lasix dose might help but will also be a  potential source for other problems) and Ace wrapping in the morning.

## 2016-06-28 ENCOUNTER — Encounter: Payer: Self-pay | Admitting: Internal Medicine

## 2016-06-30 ENCOUNTER — Other Ambulatory Visit: Payer: Self-pay | Admitting: Internal Medicine

## 2016-07-03 ENCOUNTER — Other Ambulatory Visit: Payer: Self-pay | Admitting: Internal Medicine

## 2016-07-06 ENCOUNTER — Other Ambulatory Visit: Payer: Self-pay | Admitting: Neurology

## 2016-07-11 ENCOUNTER — Other Ambulatory Visit: Payer: Self-pay | Admitting: Neurology

## 2016-07-29 ENCOUNTER — Encounter: Payer: Self-pay | Admitting: Neurology

## 2016-08-03 DIAGNOSIS — Z79891 Long term (current) use of opiate analgesic: Secondary | ICD-10-CM | POA: Diagnosis not present

## 2016-08-03 DIAGNOSIS — G894 Chronic pain syndrome: Secondary | ICD-10-CM | POA: Diagnosis not present

## 2016-08-03 DIAGNOSIS — M47816 Spondylosis without myelopathy or radiculopathy, lumbar region: Secondary | ICD-10-CM | POA: Diagnosis not present

## 2016-08-03 DIAGNOSIS — M5136 Other intervertebral disc degeneration, lumbar region: Secondary | ICD-10-CM | POA: Diagnosis not present

## 2016-08-04 ENCOUNTER — Ambulatory Visit: Payer: Medicare Other | Admitting: Podiatry

## 2016-08-04 NOTE — Progress Notes (Signed)
[name] was seen today in the movement disorders clinic for neurologic consultation at the request of Joe November, MD.  The consultation is for the evaluation of PD.  The pt is accompanied by his wife who supplements the hx.  The patient was previously seen by Dr. Linus [name] and I did have the opportunity to review those records.  Patient is wishing to transfer care because of the length of travel to [facility].  The first symptom(s) the patient noticed was unilateral hand tremor of the L hand and this was approximately 2005.  Pt is L hand dominant.   Pt went to his PCP and he was referred to [facility] and was dx with PD.  I do not have those records. Pt believes that he was started on requip but doesn't think that it helped.  He thinks that he was on it for about a year and his wife believes that it caused sleep attacks.  He is not sure what the next medication was but the physician at [facility] left and he was then referred to Dr. Linus [name] and he has been seeing him and his NP since.  The patient is currently on carbidopa/levodopa 50/200 CR 1-1/2 tablets at 8 AM/ and then one at noon/4pm/8pm along with a carbidopa/levodopa 25/100 in the afternoon and in the evening.  He admits that he often misses dosages.  He is on amantadine 100 mg 4 times a day and selegiline 5 mg twice a day.  01/02/14 update:  The patient returns today for followup.  Last visit, I discontinued the patient's extended release levodopa as he was on a strange combination of immediate release and extended release carbidopa/levodopa.  He is currently on carbidopa/levodopa 25/100 IR and takes 2 tablets at 8 AM/noon/4 PM/8 PM and then carbidopa/levodopa 50/200 at night.  Pt states that the first week after we changed, he felt very sleepy but he no longer feels that way.  I asked him to move his selegiline to 8 AM and noon because of reported insomnia.  The patient did have a modified barium swallow test done since last visit.   That was normal.  Since our last visit, the patient did have a bladder neoplasm removed on 12/19/2013 and a stent was placed.  The patient has recovered well.  He still has quite a bit of hematuria though.  He does need the stent removed.  No falls.  Limited exercise.  Was referred to neurorehab since last visit but states that he chose not to go.  Going to see mental health counselor at [facility] and that is helping.  Remains on amantadine.  04/30/14 update:   He is currently on carbidopa/levodopa 25/100 IR and takes 2 tablets at 8 AM/noon/4 PM/8 PM and then carbidopa/levodopa 50/200 at night. He is still on selegeline 5 mg bid and amantadine 100 mg.  States that often times, he is taking the selegeline too late and then has trouble sleeping.  He on Lexapro for depression.  He has not had any falls since last visit.  Not formally exercising; "I know that it needs to be done but I cannot seem to get it in the routine."  No hallucinations.  Finds that he is sleeping a bit more during the day.  Did start vesicare and that may have affected balance.  It may have helped the bladder, however.     07/30/14 update:  He is currently on carbidopa/levodopa 25/100 IR and takes 2  tablets at 8 AM/noon/4 PM/8 PM and then carbidopa/levodopa 50/200 at night.  He is also on selegiline twice per day.  He takes amantadine 100 mg tid.  I reviewed records made available to me since last visit.  The patient went to the emergency room on 06/14/2014.  He accidentally hit his head against the car door (it was a windy day and the car door shut on him) and sustained a laceration above the left forehead.  There was no loss of consciousness or alteration of consciousness.  Dermabond was placed and the patient was sent home from the emergency room.  He c/o dry mouth and thinks that it is due to medication.  He signed up at the [facility] for exercise.  He just signed up Saturday afternoon.  No lightheadness/near syncope.  No  hallucinations.  Feels a little "dopey" in the AM.  Once he takes his medications, he feels less cognitively dull.  However, he also thinks his medications may contribute to cognitive dulling.  He also complains of decreased feeling in the first to third fingers (thumb, pointer and middle) bilaterally.  He has difficulty with grasp and dropping objects.  He notices it most when he is trying to text and has to use a stylus for that.  11/29/14 update:  Pt is seen today in f/u.  He remains on carbidopa/levodopa 25/100, 2 tablets at 8 AM/12 PM/4 PM/8 p.m. in addition to carbidopa/levodopa 50/200 at night.  He is on selegiline at 8 AM and noon and amantadine, 100 mg 3 times a day.  I reviewed notes from his primary care provider.  He saw her on 11/04/2014.  He was reporting increasing depression.  He was given numbers to contact for counseling.  He hasn't done that yet.  His wife got a promotion at work and he misses having her home. He remains on Lexapro. He has no SI/HI.  He reports that he has good and bad days but is overall the same as previous.  He reports that a few weeks ago he started to have the need for an afternoon nap.  He is only asleep for 15-20 minutes (he is unsure if the unscheduled nap happens before or after the second dose of selegeline).  He is having some difficulty with his voice.  He also c/o dry mouth despite biotene. He did have a fall since last visit in his garage; states that he lost balance and went to the left and got some superficial abrasions.  Admits that while he joined [employer], he is going very little.  He doesn't like going without his wife.  Notes more difficulty tying shoes on the right.  04/02/15 update:  The patient is seen in follow-up today, accompanied by his wife who supplements the history.  I have reviewed records since last visit.  He remains on carbidopa/levodopa 25/100, 2 tablets at 8 AM/12 PM/4 PM/8 PM in addition to carbidopa/levodopa 50/200 at night.  He is on  selegiline, 5 mg twice a day and amantadine, 100 mg 3 times per day.  He was complaining about dry mouth last time and I told him he could try and decrease the amantadine to see if that helped, and he did that.  He continues to c/o dry mouth.  He states that dry mouth is even worse.  He has other medication reasons for dry mouth as well, including the Vesicare.  He thinks that dropping the amantadine caused him to be more sleepy.  He did,  however, move his Lexapro to taking that to earlier in the day to see if it helped his anxiety.  He admits to increased anxiety and fears about Parkinson's disease symptoms getting worse.  He states that he has been losing weight for many months (weight has been stable here).  I asked him about depression and his wife definitely endorsed this although he initially denied.  He did undergo a cystoscopy on 12/14/2014.  Those records were reviewed.  A low grade recurrence of his bladder cancer was noted. The patient denies any falls since last visit but has had some near falls.  No visual hallucinations but has some auditory hallucinations (hears people speaking).  No lightheadedness or near syncope.  His swallowing has been good as long as he is careful.  He did attend therapy at the neurorehab center since our last visit.  He hasn't been exercising much since leaving therapy.  States that he has absolutely no time to exercise and becomes very defensive when asked about exercise.  He states that he refuses to do this.  He and his wife brought up concerns about driving and slowed reflexes.  07/04/15 update:  The patient is seen in follow-up today.  I have reviewed records since last visit.  He remains on carbidopa/levodopa 25/100, 2 tablets at 8 AM/12 PM/4 PM/8 PM in addition to carbidopa/levodopa 50/200 at night.  He is on selegiline, 5 mg twice a day and amantadine, 100 mg 3 times per day.  He was complaining about dry mouth last time and I told him he could try and decrease the  amantadine to see if that helped, but he really did not do that.  He has other medication reasons for dry mouth as well, including the Vesicare.  Last visit, I did increase his Lexapro to 20 mg daily and strongly encouraged him to go to counseling.  His primary care physician has encouraged this multiple times as well.  He has not done this.  He also has refused to participate with cardiovascular exercise. He is c/o EDS today.  Wife states sleeping more too.   I gave him a phone number to set up a driving evaluation last visit, but he did not follow through with that.  His wife states that they are still considering this but she hasn't noted any problems.    He has had 2 falls.  He had one on front porch - was painting the railing and fell back from first step.  With the other fall, he fell over some tools that were laying out and he fell on his back and hit his head.  No LOC.  No MS change since.     11/07/15 update:  The patient is seen in follow-up today, accompanied by wife and daughter who supplement the history.  I have reviewed records since last visit.  He remains on carbidopa/levodopa 25/100, 2 tablets at 8 AM/12 PM/4 PM/8 PM in addition to carbidopa/levodopa 50/200 at night.  He is on selegiline, 5 mg twice a day and amantadine, 100 mg 3 times per day.  I asked him to go ahead and wean off of the amantadine last visit because of cognitive decline and he did that.  He underwent neuropsych testing on 08/06/2015.  This demonstrated mild dementia, major depressive disorder, generalized anxiety disorder.  He has adamantly refused counseling.  He also has refused to participate with cardiovascular exercise in the past but his wife states that they did purchase a total gym weight  machine and he is using that now.  In the past, he has been given the number for the on road driving test several times and refuses to set up that evaluation, but also refuses to follow our recommendations that if he does not want to do  the on road test, then he should not be driving.  The neuropsych test suggested significant concerns in the area of his driving.  He comes today with a form from the [facility] and his license was suspended until it was completed.  They brought a SCAT application today as well.  He canceled his physical therapy since our last visit.  There was a fall going up the garage stairs but he didn't get hurt.  This was the only fall.  No lightheadedness or near syncope.  He has 1 hour nap per day.    03/10/16 update:   The patient follows up today, accompanied by his wife who supplements the history.  He is on carbidopa/levodopa 25/100, 2 tablets at 8 AM/noon/4 PM/8 PM in addition to carbidopa/levodopa 50/200 at night.  He remains on selegiline, 5 mg twice per day.  I got an email from the patient/his family since last visit stating that anxiety/depression and sleep issues were heightened.  We started him on Remeron.  He still has trouble sleeping.  He is active some day and not other days but wife not sure if days he is more physically active if he sleeps better.  He gets up in the middle of the night and he does paperwork.  He fell on 02/07/16 outside.  He fell down his stairs outside.   He hit his knee and scraped it and he had pain in the hip on the L.  He had what sounds like a hematoma after that.  He ended up going to UC and had an xray that was negative for fx.  He states that it still aches.  They still haven't done a driving evaluation.  He is still doing some limited driving.  States that [employer] reinstated his license without ever testing him.  He did qualify for SCAT.   Asks me again about his dry mouth.  Off of vesicare and on myrbetriq.  08/06/16 update:  Patient seen today, accompanied by his caregiver and wife who supplements the history. Caregiver is there 3 days a week from 9am-2pm. Pt does better physically and emotionally on days that his caregiver is present.   He is on carbidopa/levodopa 25/100, 2 tablets at 8  AM/noon/4 PM/8 PM and carbidopa/levodopa 50/200 at nighttime.  He just emailed me recently and told me he sometimes has tremor in the middle of the night and wanted to know if he can take an extra levodopa and I told him he could take an extra half tablet of the carbidopa/levodopa 25/100 if needed but asked him not to schedule that.  He states that he thought it was only an extra 1/4 tablet but wife corrects him and states that it was a 1/2 tablet.  He does state that it helped twice but not once (only tried it 3 times).  We did discontinue his selegiline last visit.  Still on remeron for sleep and mood.  c/o anxiety but no depression.  Refuses psychiatry care or CBT.     PREVIOUS MEDICATIONS: Sinemet, Sinemet CR, Requip and Eldpryl Requip (sleep attacks)  ALLERGIES:  No Known Allergies  CURRENT MEDICATIONS:  Current Outpatient Prescriptions on File Prior to Visit  Medication Sig Dispense Refill  .  acetaminophen (TYLENOL) 500 MG tablet Take 1,000 mg by mouth every 8 (eight) hours as needed.    Marland Kitchen aspirin 81 MG tablet Take 2 tablets (162 mg total) by mouth daily. 30 tablet   . atorvastatin (LIPITOR) 40 MG tablet Take 1 tablet (40 mg total) by mouth daily. 90 tablet 1  . Ca Carbonate-Mag Hydroxide (ROLAIDS PO) Take by mouth as needed.    . carbidopa-levodopa (SINEMET CR) 50-200 MG tablet TAKE 1 TABLET BY MOUTH AT BEDTIME. 30 tablet 1  . carbidopa-levodopa (SINEMET IR) 25-100 MG tablet TAKE TWO TABLETS BY MOUTH FOUR TIMES DAILY 240 tablet 0  . diclofenac sodium (VOLTAREN) 1 % GEL Apply topically as needed.    . fexofenadine (ALLEGRA) 180 MG tablet Take 180 mg by mouth daily.     . fluorouracil (EFUDEX) 5 % cream Apply topically 2 (two) times daily.    . fluticasone (FLONASE) 50 MCG/ACT nasal spray Place 2 sprays into both nostrils daily. 16 g 5  . furosemide (LASIX) 20 MG tablet Take 1 tablet (20 mg total) by mouth daily. 30 tablet 5  . HYDROcodone-acetaminophen (NORCO/VICODIN) 5-325 MG tablet Take  1-2 tablets by mouth 3 (three) times daily. 45 tablet 0  . losartan (COZAAR) 100 MG tablet Take 1 tablet (100 mg total) by mouth daily. 30 tablet 5  . mirtazapine (REMERON) 15 MG tablet TAKE 1 TABLET (15 MG TOTAL) BY MOUTH AT BEDTIME. 30 tablet 5  . MYRBETRIQ 25 MG TB24 tablet   10  . NONFORMULARY OR COMPOUNDED ITEM [facility]:  Urea 40%, apply daily to affected areas. 120 each 2  . polyethylene glycol (MIRALAX / GLYCOLAX) packet Take 17 g by mouth daily as needed. For laxative    . Potassium Chloride CR (KLOR-CON SPRINKLE) 8 MEQ CPCR capsule CR Take 1 capsule (8 mEq total) by mouth daily. 30 capsule 5  . Probiotic Product (PROBIOTIC DAILY PO) Take 1 capsule by mouth daily.    . tamsulosin (FLOMAX) 0.4 MG CAPS capsule Take 0.4 mg by mouth daily.  11   No current facility-administered medications on file prior to visit.     PAST MEDICAL HISTORY:   Past Medical History:  Diagnosis Date  . Allergic rhinitis   . Bladder cancer [facility])    urologist-  dr eskridge--  low grade TA  . BPH with elevated PSA   . DDD (degenerative disc disease), lumbar   . Depression   . Dry mouth    USES BIOTIN SPRAY/ MOUTHWASH  . Frequency of urination   . GERD (gastroesophageal reflux disease)   . Hearing loss    does not wear his hearing aid  . Hyperlipidemia   . Hypertension   . Mild obstructive sleep apnea    PER PT  MILD OSA , NO CPAP RX,  STUDY DONE 2009  . Nocturia   . OAB (overactive bladder)   . Osteoarthritis    KNEES, SHOULDER  . Parkinson disease ([place]) DX   2005   NEUROLOGIST-   DR Raven Harmes--  IDIOPATHIC PARKINSON'S /  TREMORS CONTROLLED WITH MEDS  . Urge urinary incontinence     PAST SURGICAL HISTORY:   Past Surgical History:  Procedure Laterality Date  . CATARACT EXTRACTION W/ INTRAOCULAR LENS  IMPLANT, BILATERAL  2014  . CYSTOSCOPY W/ RETROGRADES Bilateral 12/14/2014   Procedure: CYSTOSCOPY BLADDER BIOPSY FULGERATION MITOMYCIN C BILATERAL RETROGRADE PYELOGRAM;  Surgeon: Festus Aloe, MD;  Location: [facility];  Service: Urology;  Laterality: Bilateral;  . CYSTOSCOPY  WITH STENT PLACEMENT Left 12/19/2013   Procedure: CYSTOSCOPY BLADDER BX, LEFT URETERAL STENT PLACEMENT , LEFT RETROGRADE AND PYLOGRAM;  Surgeon: Festus Aloe, MD;  Location[facility];  Service: Urology;  Laterality: Left;  . INGUINAL HERNIA REPAIR  03/09/2012   Procedure: HERNIA REPAIR INGUINAL ADULT;  Surgeon: Adin Hector, MD;  Location: [facility];  Service: General;  Laterality: Right;  . INSERTION OF MESH  03/09/2012   Procedure: INSERTION OF MESH;  Surgeon: Adin Hector, MD;  Location: [facility];  Service: General;  Laterality: Right;  . NASAL SINUS SURGERY  1982  . ORIF RIGHT ARM FX  1949   HARDWARE REMOVED   . REMOVAL BENIGN CYST LEFT FOREHEAD  1969  . TRANSURETHRAL RESECTION OF BLADDER TUMOR N/A 12/19/2013   Procedure:  TRANSURETHRAL RESECTION OF BLADDER TUMOR WITH GYRUS (TURBT-GYRUS);  Surgeon: Festus Aloe, MD;  Location: [facility];  Service: Urology;  Laterality: N/A;    SOCIAL HISTORY:   Social History   Social History  . Marital status: Married    Spouse name: [name]  . Number of children: 1  . Years of education: N/A   Occupational History  . retired 1995, worked in [place] , finances     Social History Main Topics  . Smoking status: Former Smoker    Packs/day: 1.00    Years: 15.00    Types: Cigarettes    Quit date: 04/20/1968  . Smokeless tobacco: Never Used  . Alcohol use 4.2 oz/week    7 Glasses of wine per week     Comment: ONE WINE PER DAY  . Drug use: No  . Sexual activity: Not on file   Other Topics Concern  . Not on file   Social History Narrative   Lives w/ wife   Has a Daughter 43 y/o    FAMILY HISTORY:   Family Status  Relation Status  . Mother Deceased   alzeihmer's disease  . Father Deceased   liver cancer, colon cancer  . Brother Alive  . Brother Deceased   killed   . Daughter Alive    . Neg Hx     ROS:  A complete 10 system review of systems was obtained and was unremarkable apart from what is mentioned above.  PHYSICAL EXAMINATION:    VITALS:   Vitals:   08/06/16 1109  BP: 122/60  Pulse: 70  SpO2: 98%  Weight: 175 lb (79.4 kg)  Height: 5' 10.5" (1.791 m)   Wt Readings from Last 3 Encounters:  08/06/16 175 lb (79.4 kg)  06/24/16 175 lb 6 oz (79.5 kg)  06/10/16 175 lb 8 oz (79.6 kg)    GEN:  The patient appears stated age and is in NAD. HEENT:  Normocephalic, atraumatic.  The mucous membranes are moist. The superficial temporal arteries are without ropiness or tenderness. CV:  RRR Lungs:  CTAB Neck/HEME:  There are no carotid bruits bilaterally.  Neurological examination:  Orientation: The patient is alert and oriented x3. Cranial nerves: There is good facial symmetry.  There is mild facial hypomimia.   Extraocular muscles are intact. The visual fields are full to confrontational testing. The speech is fluent and clear.  The patient has some difficulty with the guttural sounds.  He is mildly hypophonic.  Soft palate rises symmetrically and there is no tongue deviation. Hearing is intact to conversational tone. Sensation: Sensation is intact to light touch throughout. Motor: Strength is 5/5 in the bilateral upper and lower extremities.  Shoulder shrug is equal and symmetric.  There is no pronator drift.   Movement examination: Tone: There is normal tone in the bilateral upper extremities.  The tone in the lower extremities is normal.  Abnormal movements: There is no dyskinesia today Coordination:  There is good rapid alternating movements today. Gait and Station: The patient has no significant difficulty arising out of a deep-seated chair without the use of the hands. The patient's stride length is good today.  There is no dyskinesia today  There is mild camptocormia to the L.  There is decreased arm swing on the right.  There is mild LUE tremor with  ambulation.  ASSESSMENT/PLAN:  1.  Idiopathic Parkinson's disease  -He will continue taking carbidopa/levodopa 25/100 IR 2 tablets at 8 AM/noon/4 PM and he was taking 2 tablets at 8 PM, but it turns at 8 PM was bedtime and he was also taking the carbidopa/levodopa 50/200 at the same time.  Therefore, I had him just take 1 tablet of the immediate release 25/100 at 8 PM along with the carbidopa/levodopa 50/200.  Again I instructed that if he wakes up in the middle of the night he can take an extra carbidopa/levodopa 25/100 one tablet if needed.  He can crush or  dissolve this tablet if needed.  -Told the patient that I would consider full-time caregiver.  He was initially resistant, but now that he has a caregiver he likes her very much.  He does much better on the days that she is present, both emotionally and physically.  -Right now, because he does not have a full-time caregiver and his wife will get a call at work about anxiety and it turns out he has missed his medication, I recommended the alarmed pillbox.  I had our Education officer, museum show him this today. 2.  Dysphagia.  -MBE on 09/29/13 was normal and no problems currently 3.  Depression, confirmed by neuropsych testing and insomnia  -continue remeron  -add melatonin, 3mg   -would benefit from CBT but doesn't wish to go 4.  PDD  -He underwent neuropsych testing on 08/06/2015.  This confirmed mild dementia.    -shouldn't be driving without driving eval, despite fact that the [facility] reinstated his license (without any testing at all). 5.  Urinary incontinence  -seeing urology and on myrbetriq now 6.  I will plan on seeing him back in the next 5 months, sooner should new neurologic issues arise.  Much greater than 50% of this visit was spent in counseling with the patient.  This was first visit his daughter was present. Total face to face time:  25 min

## 2016-08-06 ENCOUNTER — Encounter: Payer: Self-pay | Admitting: Neurology

## 2016-08-06 ENCOUNTER — Ambulatory Visit (INDEPENDENT_AMBULATORY_CARE_PROVIDER_SITE_OTHER): Payer: Medicare Other | Admitting: Neurology

## 2016-08-06 VITALS — BP 122/60 | HR 70 | Ht 70.5 in | Wt 175.0 lb

## 2016-08-06 DIAGNOSIS — Z5181 Encounter for therapeutic drug level monitoring: Secondary | ICD-10-CM

## 2016-08-06 DIAGNOSIS — F411 Generalized anxiety disorder: Secondary | ICD-10-CM | POA: Diagnosis not present

## 2016-08-06 DIAGNOSIS — G20 Parkinson's disease: Secondary | ICD-10-CM | POA: Diagnosis not present

## 2016-08-06 DIAGNOSIS — F0280 Dementia in other diseases classified elsewhere without behavioral disturbance: Secondary | ICD-10-CM | POA: Diagnosis not present

## 2016-08-06 NOTE — Progress Notes (Signed)
Clinical Social Work Note  CSW received request from Dr. Carles Collet to meet with pt, pt wife, and caregiver during today's office visit to discuss medication management. CSW met with pt, pt wife, and pt caregiver in exam room. CSW introduced self and informed of role including connection to community resources or educational materials, short-term coping/problem-solving counseling, and involvement in PD community.   CSW explored with pt how he is currently managing his medications. Pt wife reports that she puts pt medications out each morning before leaving for work. Pt keeps track of medications on paper throughout the day on a chart that pt and pt wife created. Main concern at this time is that pt often forgets to take medication at same time every day. CSW discussed importance of taking medication especially carbidopa/levodopa the same time each day. CSW discussed the benefits of the automatic alarmed pill box and showed pt and pt wife and caregiver the automatic alarm pill box. CSW answered questions about automatic alarmed pill box and displayed the pill box for pt.   Pt has caregiver three days a week, but pt and pt wife feel automatic alarmed pill box will be helpful to pt and pt caregiver as it will have all of patient's medications together and can be transported around the house. CSW discussed that it may take time to get the alarms to a time that work and it may take time for pt to get used to the alarmed pill box being part of his routine, but discussed the long term benefits. Pt and pt wife feel that it is worth the investment at this time to purchase the automatic alarmed pill box.   [employer] automatic alarm pill box purchased (payment received by check (check# [id number]). CSW discussed that if pt wife and pt caregiver have any difficulty with set up that CSW is available to set up an appointment and assist with guiding through set up of alarms. Pt wife will be managing automatic alarm pill box and feels  that she will be able to set up alarms. CSW discussed resources including customer service and website for [employer] automatic alarm pill box.  Customer service for automatic alarm pill box phone number provided and pt wife notified to complete warranty card and mail. Pt and pt wife appreciative of support with explaining the automatic alarm pill box and showing an example. CSW to contact pt and pt wife in two weeks to check on pt and pt wife and ensure they are not having any issues in managing automatic pill dispenser. CSW provided CSW contact information and pt and pt wife are agreeable to contact CSW if any further social work needs arise.  Alison Murray, MSW, LCSW Clinical Social Worker [facility] [phone]

## 2016-08-09 ENCOUNTER — Other Ambulatory Visit: Payer: Self-pay | Admitting: Neurology

## 2016-08-09 ENCOUNTER — Other Ambulatory Visit: Payer: Self-pay | Admitting: Internal Medicine

## 2016-08-18 ENCOUNTER — Encounter: Payer: Self-pay | Admitting: Podiatry

## 2016-08-18 ENCOUNTER — Ambulatory Visit (INDEPENDENT_AMBULATORY_CARE_PROVIDER_SITE_OTHER): Payer: Medicare Other | Admitting: Podiatry

## 2016-08-18 DIAGNOSIS — B351 Tinea unguium: Secondary | ICD-10-CM

## 2016-08-18 DIAGNOSIS — M79675 Pain in left toe(s): Secondary | ICD-10-CM | POA: Diagnosis not present

## 2016-08-18 DIAGNOSIS — M79674 Pain in right toe(s): Secondary | ICD-10-CM | POA: Diagnosis not present

## 2016-08-18 NOTE — Progress Notes (Signed)
Subjective: 83 y.o. returns the office today for painful, elongated, thickened toenails which he cannot trim himself. Denies any redness or drainage around the nails. He states that he still gets swelling to both feet and this is unchanged. He does not elevate his feet and he wears the compression stockings only 1-2 times per week. Denies any acute changes since last appointment and no new complaints today. Denies any systemic complaints such as fevers, chills, nausea, vomiting.   Objective: AAO 3, NAD DP/PT pulses palpable, CRT less than 3 seconds  Nails hypertrophic, dystrophic, elongated, brittle, discolored 10. There is tenderness overlying the nails 1-5 bilaterally. There is no surrounding erythema or drainage along the nail sites. No open lesions or pre-ulcerative lesions are identified. Chronic bilateral lower extremity edema without any redness or warmth.  No other areas of tenderness bilateral lower extremities. No overlying edema, erythema, increased warmth. No pain with calf compression, swelling, warmth, erythema.  Assessment: Patient presents with symptomatic onychomycosis; swelling  Plan: -Treatment options including alternatives, risks, complications were discussed -Nails sharply debrided 10 without complication/bleeding. -Continue compound ointment for nail fungus- he states he has noticed improvement on the right foot. -Recommended compression stockings daily and to elevate his feet when sitting.  -Discussed daily foot inspection. If there are any changes, to call the office immediately.  -Follow-up in 9 week or sooner if any problems are to arise. In the meantime, encouraged to call the office with any questions, concerns, changes symptoms.  Celesta Gentile, DPM

## 2016-08-19 DIAGNOSIS — M47816 Spondylosis without myelopathy or radiculopathy, lumbar region: Secondary | ICD-10-CM | POA: Diagnosis not present

## 2016-09-01 ENCOUNTER — Encounter: Payer: Self-pay | Admitting: Internal Medicine

## 2016-09-06 ENCOUNTER — Other Ambulatory Visit: Payer: Self-pay | Admitting: Neurology

## 2016-09-08 ENCOUNTER — Ambulatory Visit (INDEPENDENT_AMBULATORY_CARE_PROVIDER_SITE_OTHER): Payer: Medicare Other | Admitting: Internal Medicine

## 2016-09-08 ENCOUNTER — Encounter: Payer: Self-pay | Admitting: Internal Medicine

## 2016-09-08 VITALS — BP 118/78 | HR 71 | Temp 97.5°F | Resp 14 | Ht 71.0 in | Wt 173.1 lb

## 2016-09-08 DIAGNOSIS — R5383 Other fatigue: Secondary | ICD-10-CM | POA: Diagnosis not present

## 2016-09-08 DIAGNOSIS — I10 Essential (primary) hypertension: Secondary | ICD-10-CM

## 2016-09-08 DIAGNOSIS — F419 Anxiety disorder, unspecified: Secondary | ICD-10-CM

## 2016-09-08 DIAGNOSIS — R600 Localized edema: Secondary | ICD-10-CM | POA: Diagnosis not present

## 2016-09-08 DIAGNOSIS — F32A Depression, unspecified: Secondary | ICD-10-CM

## 2016-09-08 DIAGNOSIS — F329 Major depressive disorder, single episode, unspecified: Secondary | ICD-10-CM | POA: Diagnosis not present

## 2016-09-08 DIAGNOSIS — D649 Anemia, unspecified: Secondary | ICD-10-CM | POA: Diagnosis not present

## 2016-09-08 LAB — CBC WITH DIFFERENTIAL/PLATELET
BASOS ABS: 0.1 10*3/uL (ref 0.0–0.1)
Basophils Relative: 1.1 % (ref 0.0–3.0)
Eosinophils Absolute: 0.1 10*3/uL (ref 0.0–0.7)
Eosinophils Relative: 2.2 % (ref 0.0–5.0)
HCT: 39.7 % (ref 39.0–52.0)
HEMOGLOBIN: 13.3 g/dL (ref 13.0–17.0)
LYMPHS ABS: 1.4 10*3/uL (ref 0.7–4.0)
Lymphocytes Relative: 22 % (ref 12.0–46.0)
MCHC: 33.6 g/dL (ref 30.0–36.0)
MCV: 94.2 fl (ref 78.0–100.0)
MONO ABS: 0.6 10*3/uL (ref 0.1–1.0)
MONOS PCT: 8.4 % (ref 3.0–12.0)
Neutro Abs: 4.4 10*3/uL (ref 1.4–7.7)
Neutrophils Relative %: 66.3 % (ref 43.0–77.0)
Platelets: 208 10*3/uL (ref 150.0–400.0)
RBC: 4.22 Mil/uL (ref 4.22–5.81)
RDW: 14.8 % (ref 11.5–15.5)
WBC: 6.6 10*3/uL (ref 4.0–10.5)

## 2016-09-08 LAB — FOLATE: FOLATE: 20.9 ng/mL (ref >5.9)

## 2016-09-08 LAB — BASIC METABOLIC PANEL
BUN: 27 mg/dL — ABNORMAL HIGH (ref 6–23)
CALCIUM: 9.3 mg/dL (ref 8.4–10.5)
CO2: 26 mEq/L (ref 19–32)
Chloride: 107 mEq/L (ref 96–112)
Creatinine, Ser: 0.91 mg/dL (ref 0.40–1.50)
GFR: 85.11 mL/min (ref >60.00)
GLUCOSE: 108 mg/dL — AB (ref 70–99)
Potassium: 4.2 mEq/L (ref 3.5–5.1)
SODIUM: 140 meq/L (ref 135–145)

## 2016-09-08 LAB — TSH: TSH: 0.68 u[IU]/mL (ref 0.35–4.50)

## 2016-09-08 LAB — VITAMIN B12: Vitamin B-12: 303 pg/mL (ref 211–911)

## 2016-09-08 NOTE — Progress Notes (Signed)
Subjective:    Patient ID: [name], male    DOB: 09/09/1933, 83 y.o.   MRN: [id number]  DOS:  09/08/2016 Type of visit - description : Routine visit, here with his wife and a caregiver Interval history: Likes to talk about edema, self DC'd Lasix, KCl. It helped but it caused  too much urination. Continue with fatigue, "lethargy", going on for years, wonders what can be done about it. Anxiety: Still an issue, very anxious, not much depression. Like to discuss colon cancer screening   Review of Systems Appetite is great No chest pain or difficulty breathing No nausea, vomiting, diarrhea. No blood in the stools Patient snores   Past Medical History:  Diagnosis Date  . Allergic rhinitis   . Bladder cancer [facility])    urologist-  dr eskridge--  low grade TA  . BPH with elevated PSA   . DDD (degenerative disc disease), lumbar   . Depression   . Dry mouth    USES BIOTIN SPRAY/ MOUTHWASH  . Frequency of urination   . GERD (gastroesophageal reflux disease)   . Hearing loss    does not wear his hearing aid  . Hyperlipidemia   . Hypertension   . Mild obstructive sleep apnea    PER PT  MILD OSA , NO CPAP RX,  STUDY DONE 2009  . Nocturia   . OAB (overactive bladder)   . Osteoarthritis    KNEES, SHOULDER  . Parkinson disease ([place]) DX   2005   NEUROLOGIST-   DR TAT--  IDIOPATHIC PARKINSON'S /  TREMORS CONTROLLED WITH MEDS  . Urge urinary incontinence     Past Surgical History:  Procedure Laterality Date  . CATARACT EXTRACTION W/ INTRAOCULAR LENS  IMPLANT, BILATERAL  2014  . CYSTOSCOPY W/ RETROGRADES Bilateral 12/14/2014   Procedure: CYSTOSCOPY BLADDER BIOPSY FULGERATION MITOMYCIN C BILATERAL RETROGRADE PYELOGRAM;  Surgeon: Festus Aloe, MD;  Location: [facility];  Service: Urology;  Laterality: Bilateral;  . CYSTOSCOPY WITH STENT PLACEMENT Left 12/19/2013   Procedure: CYSTOSCOPY BLADDER BX, LEFT URETERAL STENT PLACEMENT , LEFT RETROGRADE AND PYLOGRAM;   Surgeon: Festus Aloe, MD;  Location: [facility];  Service: Urology;  Laterality: Left;  . INGUINAL HERNIA REPAIR  03/09/2012   Procedure: HERNIA REPAIR INGUINAL ADULT;  Surgeon: Adin Hector, MD;  Location: [facility];  Service: General;  Laterality: Right;  . INSERTION OF MESH  03/09/2012   Procedure: INSERTION OF MESH;  Surgeon: Adin Hector, MD;  Location: [facility];  Service: General;  Laterality: Right;  . NASAL SINUS SURGERY  1982  . ORIF RIGHT ARM FX  1949   HARDWARE REMOVED   . REMOVAL BENIGN CYST LEFT FOREHEAD  1969  . TRANSURETHRAL RESECTION OF BLADDER TUMOR N/A 12/19/2013   Procedure:  TRANSURETHRAL RESECTION OF BLADDER TUMOR WITH GYRUS (TURBT-GYRUS);  Surgeon: Festus Aloe, MD;  Location: [facility];  Service: Urology;  Laterality: N/A;    Social History   Social History  . Marital status: Married    Spouse name: [name]  . Number of children: 1  . Years of education: N/A   Occupational History  . retired 1995, worked in [place] , finances     Social History Main Topics  . Smoking status: Former Smoker    Packs/day: 1.00    Years: 15.00    Types: Cigarettes    Quit date: 04/20/1968  . Smokeless tobacco: Never Used  . Alcohol use 4.2 oz/week  7 Glasses of wine per week     Comment: ONE WINE PER DAY  . Drug use: No  . Sexual activity: Not on file   Other Topics Concern  . Not on file   Social History Narrative   Lives w/ wife   Has a Daughter 39 y/o      Allergies as of 09/08/2016   No Known Allergies     Medication List       Accurate as of 09/08/16  6:31 PM. Always use your most recent med list.          acetaminophen 500 MG tablet Commonly known as:  TYLENOL Take 1,000 mg by mouth every 8 (eight) hours as needed.   aspirin 81 MG tablet Take 2 tablets (162 mg total) by mouth daily.   atorvastatin 40 MG tablet Commonly known as:  LIPITOR Take 1 tablet (40 mg total) by mouth daily.   carbidopa-levodopa  25-100 MG tablet Commonly known as:  SINEMET IR TAKE TWO TABLETS BY MOUTH FOUR TIMES DAILY   carbidopa-levodopa 50-200 MG tablet Commonly known as:  SINEMET CR TAKE 1 TABLET BY MOUTH AT BEDTIME.   diclofenac sodium 1 % Gel Commonly known as:  VOLTAREN Apply topically as needed.   fexofenadine 180 MG tablet Commonly known as:  ALLEGRA Take 180 mg by mouth daily.   fluorouracil 5 % cream Commonly known as:  EFUDEX Apply topically 2 (two) times daily.   fluticasone 50 MCG/ACT nasal spray Commonly known as:  FLONASE Place 2 sprays into both nostrils daily.   HYDROcodone-acetaminophen 5-325 MG tablet Commonly known as:  NORCO/VICODIN Take 1-2 tablets by mouth 3 (three) times daily.   losartan 100 MG tablet Commonly known as:  COZAAR Take 1 tablet (100 mg total) by mouth daily.   mirtazapine 15 MG tablet Commonly known as:  REMERON TAKE 1 TABLET (15 MG TOTAL) BY MOUTH AT BEDTIME.   MYRBETRIQ 25 MG Tb24 tablet Generic drug:  mirabegron ER   NONFORMULARY OR COMPOUNDED ITEM [facility]:  Urea 40%, apply daily to affected areas.   polyethylene glycol packet Commonly known as:  MIRALAX / GLYCOLAX Take 17 g by mouth daily as needed. For laxative   PROBIOTIC DAILY PO Take 1 capsule by mouth daily.   ROLAIDS PO Take by mouth as needed.   tamsulosin 0.4 MG Caps capsule Commonly known as:  FLOMAX Take 0.4 mg by mouth daily.          Objective:   Physical Exam BP 118/78 (BP Location: Left Arm, Patient Position: Sitting, Cuff Size: Small)   Pulse 71   Temp 97.5 F (36.4 C) (Oral)   Resp 14   Ht 5\' 11"  (1.803 m)   Wt 173 lb 2 oz (78.5 kg)   SpO2 98%   BMI 24.15 kg/m  General:   Well developed, well nourished . NAD.  HEENT:  Normocephalic . Face symmetric, atraumatic Lungs:  CTA B Normal respiratory effort, no intercostal retractions, no accessory muscle use. Heart: RRR,  no murmur.  Mild Pitting edema, at the lower extremities, more noticeable at the  dorsum of the foot. Skin: Not pale. Not jaundice Neurologic:  alert & oriented X3.  Speech normal  Psych--  Cognition and judgment appear intact.  Cooperative with normal attention span and concentration.  Behavior appropriate. Apprehensive appearing. No depression.      Assessment & Plan:   Assessment HTN Hyperlipidemia Depression - anxiety OSA, mild per remote sleep study (~2008?), no CPAP Parkinson  disease Dr. Carles Collet Dementia, mild GU: Dr. Junious Silk --BPH, elevated PSA --Bladder cancer-low-grade --OAB -- E.D. Dysphagia, MBE 09-2013 normal Dry mouth  DJD- back pain, sees Dr Nelva Bush, s/p local injections, on prn hydrocodone, takes rarely  L > R LE edema: [place] (-) DVT 04-2016 HOH   PLAN: HTN: self d/c Lasix and potassium, only on losartan. Check a BMP Depression anxiety: The patient continued to be very anxious, wife reports that he "worries about everything". Previously was on Lexapro, it was d/c d/t  potential interaction between Lexapro and selegiline. He has consistently declined counseling. Currently on Remeron  only. Will discuss with neurology. Go back on SSRIs? Addendum, per neurology okay to go back on SSRIs. Will d/c Remeron, re-start Lexapro 10 mg one tablet daily for 2 weeks, then 2 tablets daily. Will communicate that to the patient. Edema: Self discontinue Lasix. Leg elevation, compression stocking works well  but is cumbersome. Unfortunately there is not better solution. Colon cancer screening: Long conversation about the issue, see entry from 04-2016. I again told him that I'm willing to refer him to GI to see if the colonoscopy is appropriate or possible under the circumstances. Also wonders about the "stool test", I'm not sure if cologuard is  appropriate because his h/o colon polyp. Will call if/when ready to see GI. Lack of energy: Described by the patient has lethatgy, likely multifactorial including snoring, Parkinson, anxiety, etc. Likes his vitamins checked. Will  check a vitamin D, P29, folic acid. Also TSH. Anemia: Last hemoglobin is slightly low, iron was normal, recheck a CBC. RTC 6 months   Today, I spent more than 45   min with the patient: >50% of the time counseling regards Options for colon cancer screening. Listening to all his concerns regards edema and reinforcement the need for use compression stockings. Counseled about anxiety, benefits of counseling. Numerous questions answered to the best of my ability

## 2016-09-08 NOTE — Patient Instructions (Addendum)
GO TO THE LAB : Get the blood work     GO TO THE FRONT DESK Schedule your next appointment for a  routine checkup in 6 months  

## 2016-09-08 NOTE — Progress Notes (Signed)
Pre visit review using our clinic review tool, if applicable. No additional management support is needed unless otherwise documented below in the visit note. 

## 2016-09-09 MED ORDER — ESCITALOPRAM OXALATE 10 MG PO TABS: ORAL_TABLET | ORAL | 1 refills | Status: DC

## 2016-09-10 NOTE — Assessment & Plan Note (Signed)
HTN: self d/c Lasix and potassium, only on losartan. Check a BMP Depression anxiety: The patient continued to be very anxious, wife reports that he "worries about everything". Previously was on Lexapro, it was d/c d/t  potential interaction between Lexapro and selegiline. He has consistently declined counseling. Currently on Remeron  only. Will discuss with neurology. Go back on SSRIs? Addendum, per neurology okay to go back on SSRIs. Will d/c Remeron, re-start Lexapro 10 mg one tablet daily for 2 weeks, then 2 tablets daily. Will communicate that to the patient. Edema: Self discontinue Lasix. Leg elevation, compression stocking works well  but is cumbersome. Unfortunately there is not better solution. Colon cancer screening: Long conversation about the issue, see entry from 04-2016. I again told him that I'm willing to refer him to GI to see if the colonoscopy is appropriate or possible under the circumstances. Also wonders about the "stool test", I'm not sure if cologuard is  appropriate because his h/o colon polyp. Will call if/when ready to see GI. Lack of energy: Described by the patient has lethatgy, likely multifactorial including snoring, Parkinson, anxiety, etc. Likes his vitamins checked. Will check a vitamin D, R67, folic acid. Also TSH. Anemia: Last hemoglobin is slightly low, iron was normal, recheck a CBC. RTC 6 months

## 2016-09-12 LAB — VITAMIN D 1,25 DIHYDROXY
VITAMIN D3 1, 25 (OH): 20 pg/mL
Vitamin D 1, 25 (OH)2 Total: 20 pg/mL (ref 18–72)

## 2016-09-17 ENCOUNTER — Encounter: Payer: Self-pay | Admitting: Internal Medicine

## 2016-09-18 NOTE — Telephone Encounter (Signed)
Please advise 

## 2016-09-18 NOTE — Telephone Encounter (Signed)
Caller name: Tew,[name] Relation to FB:[id number]  Call back number: [phone] Pharmacy:  Reason for call:  Spouse checking on the status of mychart message, spouse requesting a phone call instead of my chart response,please advise

## 2016-09-21 ENCOUNTER — Other Ambulatory Visit: Payer: Self-pay | Admitting: Neurology

## 2016-09-26 ENCOUNTER — Encounter: Payer: Self-pay | Admitting: Internal Medicine

## 2016-09-29 ENCOUNTER — Encounter: Payer: Self-pay | Admitting: Internal Medicine

## 2016-10-05 ENCOUNTER — Other Ambulatory Visit: Payer: Self-pay | Admitting: Internal Medicine

## 2016-10-11 ENCOUNTER — Other Ambulatory Visit: Payer: Self-pay | Admitting: Internal Medicine

## 2016-10-12 ENCOUNTER — Other Ambulatory Visit: Payer: Self-pay | Admitting: Internal Medicine

## 2016-10-24 ENCOUNTER — Encounter: Payer: Self-pay | Admitting: Internal Medicine

## 2016-10-26 MED ORDER — FLUOXETINE HCL 20 MG PO TABS: ORAL_TABLET | ORAL | 2 refills | Status: DC

## 2016-10-26 NOTE — Telephone Encounter (Signed)
Rx sent 

## 2016-10-26 NOTE — Telephone Encounter (Signed)
Call Prozac No. 30 and 2 refills, see below  Received: Today  Message Contents  [name], Alda Berthold, MD  Damita Dunnings, CMA        prozac 20 mg 1/2 tablet a day x 10 days, then 1 tablet a day

## 2016-10-29 ENCOUNTER — Other Ambulatory Visit: Payer: Self-pay | Admitting: Dermatology

## 2016-10-29 DIAGNOSIS — D225 Melanocytic nevi of trunk: Secondary | ICD-10-CM | POA: Diagnosis not present

## 2016-10-29 DIAGNOSIS — L570 Actinic keratosis: Secondary | ICD-10-CM | POA: Diagnosis not present

## 2016-10-29 DIAGNOSIS — D044 Carcinoma in situ of skin of scalp and neck: Secondary | ICD-10-CM | POA: Diagnosis not present

## 2016-10-29 DIAGNOSIS — L814 Other melanin hyperpigmentation: Secondary | ICD-10-CM | POA: Diagnosis not present

## 2016-10-29 DIAGNOSIS — L720 Epidermal cyst: Secondary | ICD-10-CM | POA: Diagnosis not present

## 2016-11-21 ENCOUNTER — Other Ambulatory Visit: Payer: Self-pay | Admitting: Internal Medicine

## 2016-12-08 ENCOUNTER — Encounter: Payer: Self-pay | Admitting: Internal Medicine

## 2016-12-08 ENCOUNTER — Ambulatory Visit (INDEPENDENT_AMBULATORY_CARE_PROVIDER_SITE_OTHER): Payer: Medicare Other | Admitting: Internal Medicine

## 2016-12-08 VITALS — BP 122/60 | HR 65 | Temp 97.9°F | Resp 14 | Ht 71.0 in | Wt 162.5 lb

## 2016-12-08 DIAGNOSIS — R600 Localized edema: Secondary | ICD-10-CM

## 2016-12-08 DIAGNOSIS — G8929 Other chronic pain: Secondary | ICD-10-CM

## 2016-12-08 DIAGNOSIS — I10 Essential (primary) hypertension: Secondary | ICD-10-CM | POA: Diagnosis not present

## 2016-12-08 DIAGNOSIS — R5383 Other fatigue: Secondary | ICD-10-CM

## 2016-12-08 DIAGNOSIS — M549 Dorsalgia, unspecified: Secondary | ICD-10-CM | POA: Diagnosis not present

## 2016-12-08 NOTE — Progress Notes (Signed)
Pre visit review using our clinic review tool, if applicable. No additional management support is needed unless otherwise documented below in the visit note. 

## 2016-12-08 NOTE — Patient Instructions (Signed)
See you in November    Call if you like to increase your Prozac dose

## 2016-12-08 NOTE — Progress Notes (Signed)
Subjective:    Patient ID: [name], male    DOB: 1933-05-17, 83 y.o.   MRN: [id number]  DOS:  12/08/2016 Type of visit - description : rov, here with his wife and a caregiver Interval history: No new concerns Continue w/ back pain, to see Dr Nelva Bush soon per pt, to have a local injection, in the meantime was rx nucynta On prozac, pt feels the same, wife and caregiver reports he is doing better . No s/e that they can tell Still concerned about wt loss. Denies fever or chills, no headaches, when asked about unusual myalgias he said that he still has the back pain and knee pain HTN: amb BPs 130-140s LE edema: minimal swelling   Wt Readings from Last 3 Encounters:  12/08/16 162 lb 8 oz (73.7 kg)  09/08/16 173 lb 2 oz (78.5 kg)  08/06/16 175 lb (79.4 kg)     Review of Systems   Past Medical History:  Diagnosis Date  . Allergic rhinitis   . Bladder cancer [facility])    urologist-  dr eskridge--  low grade TA  . BPH with elevated PSA   . DDD (degenerative disc disease), lumbar   . Depression   . Dry mouth    USES BIOTIN SPRAY/ MOUTHWASH  . Frequency of urination   . GERD (gastroesophageal reflux disease)   . Hearing loss    does not wear his hearing aid  . Hyperlipidemia   . Hypertension   . Mild obstructive sleep apnea    PER PT  MILD OSA , NO CPAP RX,  STUDY DONE 2009  . Nocturia   . OAB (overactive bladder)   . Osteoarthritis    KNEES, SHOULDER  . Parkinson disease ([place]) DX   2005   NEUROLOGIST-   DR TAT--  IDIOPATHIC PARKINSON'S /  TREMORS CONTROLLED WITH MEDS  . Urge urinary incontinence     Past Surgical History:  Procedure Laterality Date  . CATARACT EXTRACTION W/ INTRAOCULAR LENS  IMPLANT, BILATERAL  2014  . CYSTOSCOPY W/ RETROGRADES Bilateral 12/14/2014   Procedure: CYSTOSCOPY BLADDER BIOPSY FULGERATION MITOMYCIN C BILATERAL RETROGRADE PYELOGRAM;  Surgeon: Festus Aloe, MD;  Location[facility];  Service: Urology;  Laterality: Bilateral;    . CYSTOSCOPY WITH STENT PLACEMENT Left 12/19/2013   Procedure: CYSTOSCOPY BLADDER BX, LEFT URETERAL STENT PLACEMENT , LEFT RETROGRADE AND PYLOGRAM;  Surgeon: Festus Aloe, MD;  Location: [facility];  Service: Urology;  Laterality: Left;  . INGUINAL HERNIA REPAIR  03/09/2012   Procedure: HERNIA REPAIR INGUINAL ADULT;  Surgeon: Adin Hector, MD;  [facility];  Service: General;  Laterality: Right;  . INSERTION OF MESH  03/09/2012   Procedure: INSERTION OF MESH;  Surgeon: Adin Hector, MD;  Location: [facility];  Service: General;  Laterality: Right;  . NASAL SINUS SURGERY  1982  . ORIF RIGHT ARM FX  1949   HARDWARE REMOVED   . REMOVAL BENIGN CYST LEFT FOREHEAD  1969  . TRANSURETHRAL RESECTION OF BLADDER TUMOR N/A 12/19/2013   Procedure:  TRANSURETHRAL RESECTION OF BLADDER TUMOR WITH GYRUS (TURBT-GYRUS);  Surgeon: Festus Aloe, MD;  Location: [facility];  Service: Urology;  Laterality: N/A;    Social History   Social History  . Marital status: Married    Spouse name: [name]  . Number of children: 1  . Years of education: N/A   Occupational History  . retired 1995, worked in [place] , finances  Social History Main Topics  . Smoking status: Former Smoker    Packs/day: 1.00    Years: 15.00    Types: Cigarettes    Quit date: 04/20/1968  . Smokeless tobacco: Never Used  . Alcohol use 4.2 oz/week    7 Glasses of wine per week     Comment: ONE WINE PER DAY  . Drug use: No  . Sexual activity: Not on file   Other Topics Concern  . Not on file   Social History Narrative   Lives w/ wife   Has a Daughter 62 y/o      Allergies as of 12/08/2016   No Known Allergies     Medication List       Accurate as of 12/08/16 11:59 PM. Always use your most recent med list.          acetaminophen 500 MG tablet Commonly known as:  TYLENOL Take 1,000 mg by mouth every 8 (eight) hours as needed.   aspirin 81 MG tablet Take 2 tablets (162 mg  total) by mouth daily.   atorvastatin 40 MG tablet Commonly known as:  LIPITOR Take 1 tablet (40 mg total) by mouth daily.   carbidopa-levodopa 25-100 MG tablet Commonly known as:  SINEMET IR TAKE TWO TABLETS BY MOUTH FOUR TIMES DAILY   carbidopa-levodopa 50-200 MG tablet Commonly known as:  SINEMET CR TAKE 1 TABLET BY MOUTH AT BEDTIME.   diclofenac sodium 1 % Gel Commonly known as:  VOLTAREN Apply topically as needed.   fexofenadine 180 MG tablet Commonly known as:  ALLEGRA Take 180 mg by mouth daily.   fluorouracil 5 % cream Commonly known as:  EFUDEX Apply topically 2 (two) times daily.   FLUoxetine 20 MG tablet Commonly known as:  PROZAC Take 1/2 tablet by mouth daily for 10 days and then 1 tablet daily.   fluticasone 50 MCG/ACT nasal spray Commonly known as:  FLONASE Place 2 sprays into both nostrils daily.   HYDROcodone-acetaminophen 5-325 MG tablet Commonly known as:  NORCO/VICODIN Take 1-2 tablets by mouth 3 (three) times daily.   losartan 100 MG tablet Commonly known as:  COZAAR Take 1 tablet (100 mg total) by mouth daily.   MYRBETRIQ 25 MG Tb24 tablet Generic drug:  mirabegron ER   NONFORMULARY OR COMPOUNDED ITEM [facility]:  Urea 40%, apply daily to affected areas.   polyethylene glycol packet Commonly known as:  MIRALAX / GLYCOLAX Take 17 g by mouth daily as needed. For laxative   PROBIOTIC DAILY PO Take 1 capsule by mouth daily.   ROLAIDS PO Take by mouth as needed.   tamsulosin 0.4 MG Caps capsule Commonly known as:  FLOMAX Take 0.4 mg by mouth daily.          Objective:   Physical Exam BP 122/60 (BP Location: Left Arm, Patient Position: Sitting, Cuff Size: Small)   Pulse 65   Temp 97.9 F (36.6 C) (Oral)   Resp 14   Ht 5\' 11"  (1.803 m)   Wt 162 lb 8 oz (73.7 kg)   SpO2 97%   BMI 22.66 kg/m  General:   Well developed, well nourished . NAD.  HEENT:  Normocephalic . Face symmetric, atraumatic Lungs:  CTA B Normal  respiratory effort, no intercostal retractions, no accessory muscle use. Heart: RRR,  no murmur. Minimal edema dorsum of feet, symmetric. Skin: Not pale. Not jaundice Neurologic:  alert & oriented X3.  Speech normal  Psych--  Cognition and judgment appear intact.  Cooperative  with normal attention span and concentration.  Behavior appropriate. Much less apprehensive compared to last visit  No depression.      Assessment & Plan:  Assessment HTN Hyperlipidemia Depression - anxiety: Lexapro intolerant 10-2016 OSA, mild per remote sleep study (~2008?), no CPAP Parkinson disease Dr. Carles Collet Dementia, mild GU: Dr. Junious Silk --BPH, elevated PSA --Bladder cancer-low-grade --OAB -- E.D. Dysphagia, MBE 09-2013 normal Dry mouth  DJD- back pain, sees Dr Nelva Bush, s/p local injections, on prn hydrocodone, takes rarely  L > R LE edema: [place] (-) DVT 04-2016 HOH   PLAN: HTN: Currently on losartan. Well controlled, last BMP satisfactory. Depression anxiety: Since the last visit, was intolerant to Lexapro, now on fluoxetine. Patient feels about the same but the wife reports that he is doing better. I agree, he looks much more calm today. Increase the dose?Marland Kitchen Patient not sure, but after some thinking we agree to increase to 30 mg daily.   Edema: Minimal despite stopping the Lasix. Lack of energy, fatigue, about the same, recent labs were normal. Weight loss: Recent labs satisfactory, observation for now. Back pain: This continued to be a major problem for the patient, appoint pending w/ Dr Nelva Bush but he Rx Nucynta, no interactions w/ current meds, thus ok to try  RTC 02-2017

## 2016-12-09 ENCOUNTER — Encounter: Payer: Self-pay | Admitting: Internal Medicine

## 2016-12-09 ENCOUNTER — Encounter: Payer: Self-pay | Admitting: Neurology

## 2016-12-09 DIAGNOSIS — L820 Inflamed seborrheic keratosis: Secondary | ICD-10-CM | POA: Diagnosis not present

## 2016-12-09 DIAGNOSIS — L570 Actinic keratosis: Secondary | ICD-10-CM | POA: Diagnosis not present

## 2016-12-09 DIAGNOSIS — Z872 Personal history of diseases of the skin and subcutaneous tissue: Secondary | ICD-10-CM | POA: Diagnosis not present

## 2016-12-09 DIAGNOSIS — D225 Melanocytic nevi of trunk: Secondary | ICD-10-CM | POA: Diagnosis not present

## 2016-12-09 MED ORDER — FLUOXETINE HCL 20 MG PO TABS: 30.0000 mg | ORAL_TABLET | Freq: Every day | ORAL | 6 refills | Status: DC

## 2016-12-09 NOTE — Assessment & Plan Note (Signed)
HTN: Currently on losartan. Well controlled, last BMP satisfactory. Depression anxiety: Since the last visit, was intolerant to Lexapro, now on fluoxetine. Patient feels about the same but the wife reports that he is doing better. I agree, he looks much more calm today. Increase the dose?Marland Kitchen Patient not sure, but after some thinking we agree to increase to 30 mg daily.   Edema: Minimal despite stopping the Lasix. Lack of energy, fatigue, about the same, recent labs were normal. Weight loss: Recent labs satisfactory, observation for now. Back pain: This continued to be a major problem for the patient, appoint pending w/ Dr Nelva Bush but he Rx Nucynta, no interactions w/ current meds, thus ok to try  RTC 02-2017

## 2016-12-22 DIAGNOSIS — M47816 Spondylosis without myelopathy or radiculopathy, lumbar region: Secondary | ICD-10-CM | POA: Diagnosis not present

## 2016-12-25 ENCOUNTER — Encounter: Payer: Self-pay | Admitting: Internal Medicine

## 2016-12-28 MED ORDER — FLUOXETINE HCL 10 MG PO CAPS: 30.0000 mg | ORAL_CAPSULE | Freq: Every day | ORAL | 6 refills | Status: DC

## 2016-12-28 NOTE — Telephone Encounter (Signed)
See patient's message  Received: [place], MD  Damita Dunnings, CMA        Change to fluoxetine 10 mg, 3 capsules daily #906

## 2016-12-28 NOTE — Telephone Encounter (Signed)
Rx sent 

## 2016-12-30 ENCOUNTER — Other Ambulatory Visit: Payer: Self-pay | Admitting: Physical Medicine and Rehabilitation

## 2016-12-30 DIAGNOSIS — M48061 Spinal stenosis, lumbar region without neurogenic claudication: Secondary | ICD-10-CM

## 2017-01-01 ENCOUNTER — Ambulatory Visit
Admission: RE | Admit: 2017-01-01 | Discharge: 2017-01-01 | Disposition: A | Discharge status: Home / Self Care | Payer: Medicare Other | Source: Ambulatory Visit | Attending: Physical Medicine and Rehabilitation | Admitting: Physical Medicine and Rehabilitation

## 2017-01-01 ENCOUNTER — Other Ambulatory Visit: Payer: Self-pay

## 2017-01-01 DIAGNOSIS — M48061 Spinal stenosis, lumbar region without neurogenic claudication: Secondary | ICD-10-CM

## 2017-01-04 ENCOUNTER — Emergency Department (HOSPITAL_BASED_OUTPATIENT_CLINIC_OR_DEPARTMENT_OTHER)
Admission: EM | Admit: 2017-01-04 | Discharge: 2017-01-04 | Disposition: A | Discharge status: Home / Self Care | Payer: Medicare Other | Attending: Emergency Medicine | Admitting: Emergency Medicine

## 2017-01-04 ENCOUNTER — Telehealth: Payer: Self-pay | Admitting: Internal Medicine

## 2017-01-04 ENCOUNTER — Encounter: Payer: Self-pay | Admitting: Internal Medicine

## 2017-01-04 ENCOUNTER — Encounter (HOSPITAL_BASED_OUTPATIENT_CLINIC_OR_DEPARTMENT_OTHER): Payer: Self-pay | Admitting: *Deleted

## 2017-01-04 DIAGNOSIS — G8929 Other chronic pain: Secondary | ICD-10-CM | POA: Diagnosis not present

## 2017-01-04 DIAGNOSIS — Z79899 Other long term (current) drug therapy: Secondary | ICD-10-CM | POA: Diagnosis not present

## 2017-01-04 DIAGNOSIS — M5414 Radiculopathy, thoracic region: Secondary | ICD-10-CM | POA: Diagnosis not present

## 2017-01-04 DIAGNOSIS — M545 Low back pain: Secondary | ICD-10-CM

## 2017-01-04 DIAGNOSIS — Z7982 Long term (current) use of aspirin: Secondary | ICD-10-CM | POA: Diagnosis not present

## 2017-01-04 DIAGNOSIS — I10 Essential (primary) hypertension: Secondary | ICD-10-CM | POA: Insufficient documentation

## 2017-01-04 DIAGNOSIS — M5410 Radiculopathy, site unspecified: Secondary | ICD-10-CM

## 2017-01-04 DIAGNOSIS — Z8551 Personal history of malignant neoplasm of bladder: Secondary | ICD-10-CM | POA: Insufficient documentation

## 2017-01-04 DIAGNOSIS — Z87891 Personal history of nicotine dependence: Secondary | ICD-10-CM | POA: Insufficient documentation

## 2017-01-04 DIAGNOSIS — G20 Parkinson's disease: Secondary | ICD-10-CM | POA: Insufficient documentation

## 2017-01-04 DIAGNOSIS — M5440 Lumbago with sciatica, unspecified side: Secondary | ICD-10-CM | POA: Diagnosis not present

## 2017-01-04 MED ORDER — LIDOCAINE HCL 4 % EX SOLN
Freq: Once | CUTANEOUS | Status: DC
  Filled 2017-01-04: qty 50

## 2017-01-04 MED ORDER — LIDOCAINE 4 % EX GEL
1.0000 | Freq: Three times a day (TID) | CUTANEOUS | 0 refills | Status: DC | PRN
Start: 2017-01-04 — End: 2017-01-04

## 2017-01-04 MED ORDER — LIDOCAINE 5 % EX OINT: TOPICAL_OINTMENT | Freq: Once | CUTANEOUS | Status: DC

## 2017-01-04 MED ORDER — PREDNISONE 10 MG PO TABS: 60.0000 mg | ORAL_TABLET | Freq: Every day | ORAL | 0 refills | Status: DC

## 2017-01-04 MED ORDER — KETOROLAC TROMETHAMINE 10 MG PO TABS: 10.0000 mg | ORAL_TABLET | Freq: Four times a day (QID) | ORAL | 0 refills | Status: DC | PRN

## 2017-01-04 MED ORDER — LIDOCAINE 4 % EX GEL: 1.0000 "application " | Freq: Three times a day (TID) | CUTANEOUS | 0 refills | Status: DC | PRN

## 2017-01-04 MED ORDER — ACETAMINOPHEN 325 MG PO TABS
ORAL_TABLET | ORAL | Status: AC
  Filled 2017-01-04: qty 2

## 2017-01-04 MED ORDER — ACETAMINOPHEN 325 MG PO TABS
650.0000 mg | ORAL_TABLET | Freq: Once | ORAL | Status: AC
  Administered 2017-01-04: 650 mg via ORAL
  Filled 2017-01-04: qty 2

## 2017-01-04 MED ORDER — KETOROLAC TROMETHAMINE 15 MG/ML IJ SOLN
30.0000 mg | Freq: Once | INTRAMUSCULAR | Status: AC
  Administered 2017-01-04: 30 mg via INTRAMUSCULAR
  Filled 2017-01-04: qty 2

## 2017-01-04 MED FILL — LIDOCAINE 5% OINTMENT: 5 | 10 days supply | Qty: 30 | Fill #0

## 2017-01-04 MED FILL — predniSONE 10 MG TABS: 10 | 5 days supply | Qty: 30 | Fill #0

## 2017-01-04 MED FILL — KETOROLAC 10 MG TABLET: 10 | 5 days supply | Qty: 20 | Fill #0

## 2017-01-04 NOTE — Discharge Instructions (Signed)
Please call Dr. Nelva Bush for an appointment.  We spoke with Dr. Larose Kells, he would like you to try toradol instead of the aleve. He will see you in the clinic either tomorrow or day after - please contact his office if you haven't heard from them by 2 pm today.

## 2017-01-04 NOTE — ED Notes (Signed)
ED Provider at bedside. 

## 2017-01-04 NOTE — ED Provider Notes (Signed)
[place] DEPT [facility] Provider Note   CSN: [id number] Arrival date & time: 01/04/17  3762     History   Chief Complaint Chief Complaint  Patient presents with  . Back Pain    HPI ANUEL SITTER is a 83 y.o. male.  HPI Pt with hx of bladder CA comes in with cc of back pain. Pt has hx of parkinson's and chronic back pain. Pt is seeing Ortho spine and had a recent MRI. Pt had a epidural pain shot 1 week ago. Pt reports that his pain has been present for several weeks, but the last 2 weeks pt's pain has been getting worse. Pt is having extreme difficulty with movement. Pt takes alevel, tylenol and voltaren gel. He has tried narcotic pain meds without pain relief.   Past Medical History:  Diagnosis Date  . Allergic rhinitis   . Bladder cancer [facility])    urologist-  dr eskridge--  low grade TA  . BPH with elevated PSA   . DDD (degenerative disc disease), lumbar   . Depression   . Dry mouth    USES BIOTIN SPRAY/ MOUTHWASH  . Frequency of urination   . GERD (gastroesophageal reflux disease)   . Hearing loss    does not wear his hearing aid  . Hyperlipidemia   . Hypertension   . Mild obstructive sleep apnea    PER PT  MILD OSA , NO CPAP RX,  STUDY DONE 2009  . Nocturia   . OAB (overactive bladder)   . Osteoarthritis    KNEES, SHOULDER  . Parkinson disease ([place]) DX   2005   NEUROLOGIST-   DR TAT--  IDIOPATHIC PARKINSON'S /  TREMORS CONTROLLED WITH MEDS  . Urge urinary incontinence     Patient Active Problem List   Diagnosis Date Noted  . PCP NOTES >>>>>>>>>>>>>>>>>> 11/19/2015  . Dementia due to Parkinson's disease without behavioral disturbance ([place]) 11/07/2015  . PD (Parkinson's disease) ([place]) 11/07/2015  . Bladder cancer ([place]) 04/26/2014  . Right inguinal hernia 09/10/2011  . Direct inguinal hernia 08/07/2011  . Urinary frequency 03/18/2011  . Skin lesion 03/18/2011  . General medical examination 03/18/2011  . Nail fungus 10/14/2010  . Fatigue 10/14/2010  .  RHINITIS 02/12/2010  . GERD 12/03/2009  . Depression 08/28/2009  . WEIGHT LOSS 08/28/2009  . PSA, INCREASED 06/11/2009  . Back pain 01/09/2009  . NEOPLASM OF UNCERTAIN BEHAVIOR OF SKIN 07/05/2008  . PARKINSON'S DISEASE 05/13/2007  . Hyperlipidemia 02/28/2007  . ERECTILE DYSFUNCTION 02/28/2007  . Essential hypertension 02/28/2007  . Osteoarthritis 02/28/2007    Past Surgical History:  Procedure Laterality Date  . CATARACT EXTRACTION W/ INTRAOCULAR LENS  IMPLANT, BILATERAL  2014  . CYSTOSCOPY W/ RETROGRADES Bilateral 12/14/2014   Procedure: CYSTOSCOPY BLADDER BIOPSY FULGERATION MITOMYCIN C BILATERAL RETROGRADE PYELOGRAM;  Surgeon: Festus Aloe, MD;  Location: [facility];  Service: Urology;  Laterality: Bilateral;  . CYSTOSCOPY WITH STENT PLACEMENT Left 12/19/2013   Procedure: CYSTOSCOPY BLADDER BX, LEFT URETERAL STENT PLACEMENT , LEFT RETROGRADE AND PYLOGRAM;  Surgeon: Festus Aloe, MD;  Location: [facility];  Service: Urology;  Laterality: Left;  . INGUINAL HERNIA REPAIR  03/09/2012   Procedure: HERNIA REPAIR INGUINAL ADULT;  Surgeon: Adin Hector, MD;  [facility];  Service: General;  Laterality: Right;  . INSERTION OF MESH  03/09/2012   Procedure: INSERTION OF MESH;  Surgeon: Adin Hector, MD;  [facility]: [facility];  Service: General;  Laterality: Right;  . NASAL SINUS  SURGERY  1982  . ORIF RIGHT ARM FX  1949   HARDWARE REMOVED   . REMOVAL BENIGN CYST LEFT FOREHEAD  1969  . TRANSURETHRAL RESECTION OF BLADDER TUMOR N/A 12/19/2013   Procedure:  TRANSURETHRAL RESECTION OF BLADDER TUMOR WITH GYRUS (TURBT-GYRUS);  Surgeon: Festus Aloe, MD;  Location: [facility];  Service: Urology;  Laterality: N/A;       Home Medications    Prior to Admission medications   Medication Sig Start Date End Date Taking? Authorizing Provider  acetaminophen (TYLENOL) 500 MG tablet Take 1,000 mg by mouth every 8 (eight) hours as needed.     [provider]  aspirin 81 MG tablet Take 2 tablets (162 mg total) by mouth daily. Patient not taking: Reported on 12/08/2016 12/16/14   Festus Aloe, MD  atorvastatin (LIPITOR) 40 MG tablet Take 1 tablet (40 mg total) by mouth daily. 11/23/16   Colon Branch, MD  Ca Carbonate-Mag Hydroxide (ROLAIDS PO) Take by mouth as needed.    [provider]  carbidopa-levodopa (SINEMET CR) 50-200 MG tablet TAKE 1 TABLET BY MOUTH AT BEDTIME. 09/07/16   Tat, Eustace Quail, DO  carbidopa-levodopa (SINEMET IR) 25-100 MG tablet TAKE TWO TABLETS BY MOUTH FOUR TIMES DAILY 08/10/16   Tat, Eustace Quail, DO  diclofenac sodium (VOLTAREN) 1 % GEL Apply topically as needed.    [provider]  fexofenadine (ALLEGRA) 180 MG tablet Take 180 mg by mouth daily.     [provider]  fluorouracil (EFUDEX) 5 % cream Apply topically 2 (two) times daily.    [provider]  FLUoxetine (PROZAC) 10 MG capsule Take 3 capsules (30 mg total) by mouth daily. 12/28/16   Colon Branch, MD  fluticasone [facility]) 50 MCG/ACT nasal spray Place 2 sprays into both nostrils daily. 01/06/16   Colon Branch, MD  HYDROcodone-acetaminophen (NORCO/VICODIN) 5-325 MG tablet Take 1-2 tablets by mouth 3 (three) times daily. Patient not taking: Reported on 12/08/2016 06/10/16   Colon Branch, MD  losartan (COZAAR) 100 MG tablet Take 1 tablet (100 mg total) by mouth daily. 10/12/16   Colon Branch, MD  MYRBETRIQ 25 MG TB24 tablet  02/24/16   [provider]  NONFORMULARY OR COMPOUNDED [place]:  Urea 40%, apply daily to affected areas. 12/13/15   Trula Slade, DPM  polyethylene glycol (MIRALAX / GLYCOLAX) packet Take 17 g by mouth daily as needed. For laxative    [provider]  Probiotic Product (PROBIOTIC DAILY PO) Take 1 capsule by mouth daily.    [provider]  tamsulosin (FLOMAX) 0.4 MG CAPS capsule Take 0.4 mg by mouth daily. 05/01/16   [provider]    Family  History Family History  Problem Relation Age of Onset  . Alzheimer's disease Mother   . Liver cancer Father   . Cancer Father        colon  . Prostate cancer Neg Hx   . CAD Neg Hx     Social History Social History  Substance Use Topics  . Smoking status: Former Smoker    Packs/day: 1.00    Years: 15.00    Types: Cigarettes    Quit date: 04/20/1968  . Smokeless tobacco: Never Used  . Alcohol use 4.2 oz/week    7 Glasses of wine per week     Comment: ONE WINE PER DAY     Allergies   Patient has no known allergies.   Review of Systems Review  of Systems  Constitutional: Positive for activity change.  Musculoskeletal: Positive for back pain.  Skin: Negative for rash.  Neurological: Positive for numbness.     Physical Exam Updated Vital Signs BP (!) 168/76 (BP Location: Right Arm)   Pulse 87   Temp 98.7 F (37.1 C) (Oral)   Resp 16   Ht 5\' 11"  (1.803 m)   Wt 72.6 kg (160 lb)   SpO2 95%   BMI 22.32 kg/m   Physical Exam  Constitutional: He is oriented to person, place, and time. He appears well-developed.  HENT:  Head: Atraumatic.  Neck: Neck supple.  Cardiovascular: Normal rate.   Pulmonary/Chest: Effort normal.  Abdominal: Soft.  Musculoskeletal:  Pt has tenderness over the lumbar region No step offs, no erythema. Pt has 2+ patellar reflex bilaterally. Able to discriminate between sharp and dull. Able to ambulate    Neurological: He is alert and oriented to person, place, and time.  Skin: Skin is warm.  Nursing note and vitals reviewed.   ED Treatments / Results  Labs (all labs ordered are listed, but only abnormal results are displayed) Labs Reviewed - No data to display  EKG  EKG Interpretation None       Radiology No results found.  Procedures Procedures (including critical care time)  Medications Ordered in ED Medications  lidocaine (XYLOCAINE) 4 % external solution (not administered)  acetaminophen (TYLENOL) tablet 650 mg (not  administered)  acetaminophen (TYLENOL) 325 MG tablet (not administered)     Initial Impression / Assessment and Plan / ED Course  I have reviewed the triage vital signs and the nursing notes.  Pertinent labs & imaging results that were available during my care of the patient were reviewed by me and considered in my medical decision making (see chart for details).    Pt comes in with cc of back pain. Back pain is severe and getting worse. Pt has some numbness in his legs and pain is worse with movement, no red flags for cord compression and the MRI from Friday shows worsening DJD of the spine.  Spoke with Dr. Larose Kells, PCP - he wants pt to get started on toradol. We will add lidocaine patch. Dr. Larose Kells will try to get pt into his clinic tomorrow.    Final Clinical Impressions(s) / ED Diagnoses   Final diagnoses:  Radicular pain    New Prescriptions New Prescriptions   No medications on file     Varney Biles, MD 01/04/17 [phone]

## 2017-01-04 NOTE — Telephone Encounter (Signed)
Spoke with the ER doctor, this morning the patient is therefore evaluation. He has chronic back pain, had epidural several days ago which did not help. Also had a MRI done which is pending. He stopped narcotics because they didn't help and only-side effects. Currently taking Aleve, Tylenol and using the Voltaren patch. I suggested Toradol (kidney function is normal)for few days, a round of prednisone (patient is not diabetic), follow up with pain management Dr. Nelva Bush. The patient also would like to be seen here. Please call the patient and scheduled an appointment for this week.

## 2017-01-04 NOTE — Telephone Encounter (Signed)
Pt scheduled 01/05/2017 at 1300.

## 2017-01-04 NOTE — ED Triage Notes (Addendum)
C/o gradually progressively worsening of low back pain, radiates through buttocks down both legs to thighs and knees, R>L. Denies: fever, urinary sx, NVD, saddle paresthesia, or loss of control of bowel or bladder, "feels sensation/urge for B&B". Sitting in w/c pain is 4/10, otherwise standing and moving 10/10. MRI on Friday. Last epidural injection by Dr. Nelva Bush 12/22/16. Tylenol take at 2030, Aleve taken at 0300. Stopped codeine 2 weeks ago, stopped MS IR on Saturday. Pain associated with weakness and tingling. Pt admits to functional incontinence. Also constipation. Took an enema yesterday with some results. PCP Dr. Larose Kells.

## 2017-01-05 ENCOUNTER — Encounter: Payer: Self-pay | Admitting: Internal Medicine

## 2017-01-05 ENCOUNTER — Ambulatory Visit (INDEPENDENT_AMBULATORY_CARE_PROVIDER_SITE_OTHER): Payer: Medicare Other | Admitting: Internal Medicine

## 2017-01-05 VITALS — BP 126/84 | HR 68 | Temp 98.0°F | Resp 14 | Ht 71.0 in

## 2017-01-05 DIAGNOSIS — G8929 Other chronic pain: Secondary | ICD-10-CM | POA: Diagnosis not present

## 2017-01-05 DIAGNOSIS — M549 Dorsalgia, unspecified: Secondary | ICD-10-CM

## 2017-01-05 MED ORDER — HYDROCODONE-ACETAMINOPHEN 5-325 MG PO TABS: 1.0000 | ORAL_TABLET | Freq: Three times a day (TID) | ORAL | 0 refills | Status: DC

## 2017-01-05 NOTE — Progress Notes (Signed)
Pre visit review using our clinic review tool, if applicable. No additional management support is needed unless otherwise documented below in the visit note. 

## 2017-01-05 NOTE — Patient Instructions (Signed)
Please see Dr Nelva Bush as recommended

## 2017-01-05 NOTE — Progress Notes (Signed)
Subjective:    Patient ID: [name], male    DOB: 1933-11-03, 83 y.o.   MRN: [id number]  DOS:  01/05/2017 Type of visit - description : acute Interval history: Patient is here with his wife and a caregiver, we discussed his most recent issues with back pain: Patient was taken Nucynta without much help. He got a local injection without much help Dr Nelva Bush also ordrer a MRI of the back and  changed pain meds  to morphine 3 times a day ,but he did develop severe constipation and stop morphine 01/02/2017. Self prescribed enema and since then the constipation is resolved. Went to the ER 01/04/2017 with severe back pain (he stopped all pain med), states was the worse pain he ever had. Description of the back pain is similar to previous months: Bilaterally, radiatesdown to the buttocks and posterior thigh B. After we talked with the ER doctor we agreed to try temporarily a prednisone taper and Toradol. Here for follow-up. Pain is somewhat decreased now.    Review of Systems  no fever chills No injury No bladder or bowel incontinence No rash So far is tolerating prednisone and Toradol without apparent side effects.  Past Medical History:  Diagnosis Date  . Allergic rhinitis   . Bladder cancer [facility])    urologist-  dr eskridge--  low grade TA  . BPH with elevated PSA   . DDD (degenerative disc disease), lumbar   . Depression   . Dry mouth    USES BIOTIN SPRAY/ MOUTHWASH  . Frequency of urination   . GERD (gastroesophageal reflux disease)   . Hearing loss    does not wear his hearing aid  . Hyperlipidemia   . Hypertension   . Mild obstructive sleep apnea    PER PT  MILD OSA , NO CPAP RX,  STUDY DONE 2009  . Nocturia   . OAB (overactive bladder)   . Osteoarthritis    KNEES, SHOULDER  . Parkinson disease ([place]) DX   2005   NEUROLOGIST-   DR TAT--  IDIOPATHIC PARKINSON'S /  TREMORS CONTROLLED WITH MEDS  . Urge urinary incontinence     Past Surgical History:  Procedure  Laterality Date  . CATARACT EXTRACTION W/ INTRAOCULAR LENS  IMPLANT, BILATERAL  2014  . CYSTOSCOPY W/ RETROGRADES Bilateral 12/14/2014   Procedure: CYSTOSCOPY BLADDER BIOPSY FULGERATION MITOMYCIN C BILATERAL RETROGRADE PYELOGRAM;  Surgeon: Festus Aloe, MD;  Location: [facility];  Service: Urology;  Laterality: Bilateral;  . CYSTOSCOPY WITH STENT PLACEMENT Left 12/19/2013   Procedure: CYSTOSCOPY BLADDER BX, LEFT URETERAL STENT PLACEMENT , LEFT RETROGRADE AND PYLOGRAM;  Surgeon: Festus Aloe, MD;  Location: [facility];  Service: Urology;  Laterality: Left;  . INGUINAL HERNIA REPAIR  03/09/2012   Procedure: HERNIA REPAIR INGUINAL ADULT;  Surgeon: Adin Hector, MD;  [facility];  Service: General;  Laterality: Right;  . INSERTION OF MESH  03/09/2012   Procedure: INSERTION OF MESH;  Surgeon: Adin Hector, MD;  [facility];  Service: General;  Laterality: Right;  . NASAL SINUS SURGERY  1982  . ORIF RIGHT ARM FX  1949   HARDWARE REMOVED   . REMOVAL BENIGN CYST LEFT FOREHEAD  1969  . TRANSURETHRAL RESECTION OF BLADDER TUMOR N/A 12/19/2013   Procedure:  TRANSURETHRAL RESECTION OF BLADDER TUMOR WITH GYRUS (TURBT-GYRUS);  Surgeon: Festus Aloe, MD;  Location: [facility];  Service: Urology;  Laterality: N/A;    Social History  Social History  . Marital status: Married    Spouse name: [name]  . Number of children: 1  . Years of education: N/A   Occupational History  . retired 1995, worked in [place] , finances     Social History Main Topics  . Smoking status: Former Smoker    Packs/day: 1.00    Years: 15.00    Types: Cigarettes    Quit date: 04/20/1968  . Smokeless tobacco: Never Used  . Alcohol use 4.2 oz/week    7 Glasses of wine per week     Comment: ONE WINE PER DAY  . Drug use: No  . Sexual activity: Not on file   Other Topics Concern  . Not on file   Social History Narrative   Lives w/ wife   Has a  Daughter 44 y/o      Allergies as of 01/05/2017   No Known Allergies     Medication List       Accurate as of 01/05/17 11:59 PM. Always use your most recent med list.          acetaminophen 500 MG tablet Commonly known as:  TYLENOL Take 1,000 mg by mouth every 8 (eight) hours as needed.   atorvastatin 40 MG tablet Commonly known as:  LIPITOR Take 1 tablet (40 mg total) by mouth daily.   carbidopa-levodopa 25-100 MG tablet Commonly known as:  SINEMET IR TAKE TWO TABLETS BY MOUTH FOUR TIMES DAILY   carbidopa-levodopa 50-200 MG tablet Commonly known as:  SINEMET CR TAKE 1 TABLET BY MOUTH AT BEDTIME.   diclofenac sodium 1 % Gel Commonly known as:  VOLTAREN Apply topically as needed.   fexofenadine 180 MG tablet Commonly known as:  ALLEGRA Take 180 mg by mouth daily.   fluorouracil 5 % cream Commonly known as:  EFUDEX Apply topically 2 (two) times daily.   FLUoxetine 10 MG capsule Commonly known as:  PROZAC Take 3 capsules (30 mg total) by mouth daily.   fluticasone 50 MCG/ACT nasal spray Commonly known as:  FLONASE Place 2 sprays into both nostrils daily.   HYDROcodone-acetaminophen 5-325 MG tablet Commonly known as:  NORCO/VICODIN Take 1-2 tablets by mouth 3 (three) times daily.   ketorolac 10 MG tablet Commonly known as:  TORADOL Take 1 tablet (10 mg total) by mouth every 6 (six) hours as needed for moderate pain.   losartan 100 MG tablet Commonly known as:  COZAAR Take 1 tablet (100 mg total) by mouth daily.   MYRBETRIQ 25 MG Tb24 tablet Generic drug:  mirabegron ER   NONFORMULARY OR COMPOUNDED ITEM [facility]:  Urea 40%, apply daily to affected areas.   polyethylene glycol packet Commonly known as:  MIRALAX / GLYCOLAX Take 17 g by mouth daily as needed. For laxative   predniSONE 10 MG tablet Commonly known as:  DELTASONE Take 6 tablets (60 mg total) by mouth daily.   PROBIOTIC DAILY PO Take 1 capsule by mouth daily.   ROLAIDS  PO Take by mouth as needed.   tamsulosin 0.4 MG Caps capsule Commonly known as:  FLOMAX Take 0.4 mg by mouth daily.            Discharge Care Instructions        Start     Ordered   01/05/17 0000  HYDROcodone-acetaminophen (NORCO/VICODIN) 5-325 MG tablet  3 times daily     01/05/17 1324         Objective:   Physical Exam BP 126/84 (BP Location:  Right Arm, Patient Position: Sitting, Cuff Size: Small)   Pulse 68   Temp 98 F (36.7 C) (Oral)   Resp 14   Ht 5\' 11"  (1.803 m)   SpO2 98%  General:   Well developed, well nourished, sitting in wheelchair, no acute distress.  HEENT:  Normocephalic . Face symmetric, atraumatic Lungs:  CTA B Normal respiratory effort, no intercostal retractions, no accessory muscle use. Heart: RRR,  no murmur.  No pretibial edema bilaterally  Skin: Not pale. Not jaundice Neurologic:  alert & oriented X3.  Speech normal, gait not tested, motor symmetric MSK: Slightly TTP  right from the lumbar spine. Psych--  Cognition and judgment appear intact.  Cooperative with normal attention span and concentration.  Behavior appropriate. No anxious or depressed appearing.      Assessment & Plan:    Assessment HTN Hyperlipidemia Depression - anxiety: Lexapro intolerant 10-2016 OSA, mild per remote sleep study (~2008?), no CPAP Parkinson disease Dr. Carles Collet Dementia, mild GU: Dr. Junious Silk --BPH, elevated PSA --Bladder cancer-low-grade --OAB -- E.D. Dysphagia, MBE 09-2013 normal Dry mouth  DJD- back pain, sees Dr Nelva Bush, s/p local injections, on prn hydrocodone, takes rarely  L > R LE edema: [place] (-) DVT 04-2016 HOH   PLAN: DJD, back pain:  see HPI for the recent chain of events. He did not get much help from [place], was switch to  morphine 3 times a day but got severely constipated. He stopped all pain med and entually went to the ER yesterday, after we discussed with the ER doctor the situation we agreed to do prednisone and Toradol. Today  he is a little better, he understands steroids-toradol are only temporary measures   until he can see Dr. Nelva Bush for more long-term management plan. I did review the MRI ordered by Dr. Nelva Bush and discussed w/ the report, he has spinal stenosis; again I made clear to the patient that  long-term management of the problem is by Dr Nelva Bush. Also, I refilled his hydrocodone. Addendum: I spoke today 01/06/2017 with Dr. Nelva Bush, he already saw the patient, he is changing his medication and referred him to neurosurgery.  He may not be a good surgical candidate and management may be conservative only.   Today, I spent more than  38  min with the patient: >50% of the time counseling regards back pain management, reviewing the chart and x-rays ordered by other doctors.

## 2017-01-06 DIAGNOSIS — M48061 Spinal stenosis, lumbar region without neurogenic claudication: Secondary | ICD-10-CM | POA: Diagnosis not present

## 2017-01-06 DIAGNOSIS — G894 Chronic pain syndrome: Secondary | ICD-10-CM | POA: Diagnosis not present

## 2017-01-06 DIAGNOSIS — M5136 Other intervertebral disc degeneration, lumbar region: Secondary | ICD-10-CM | POA: Diagnosis not present

## 2017-01-06 DIAGNOSIS — M47816 Spondylosis without myelopathy or radiculopathy, lumbar region: Secondary | ICD-10-CM | POA: Diagnosis not present

## 2017-01-06 NOTE — Assessment & Plan Note (Signed)
DJD, back pain:  see HPI for the recent chain of events. He did not get much help from [facility], was switch to  morphine 3 times a day but got severely constipated. He stopped all pain med and entually went to the ER yesterday, after we discussed with the ER doctor the situation we agreed to do prednisone and Toradol. Today he is a little better, he understands steroids-toradol are only temporary measures   until he can see Dr. Nelva Bush for more long-term management plan. I did review the MRI ordered by Dr. Nelva Bush and discussed w/ the report, he has spinal stenosis; again I made clear to the patient that  long-term management of the problem is by Dr Nelva Bush. Also, I refilled his hydrocodone. Addendum: I spoke today 01/06/2017 with Dr. Nelva Bush, he already saw the patient, he is changing his medication and referred him to neurosurgery.  He may not be a good surgical candidate and management may be conservative only.

## 2017-01-07 ENCOUNTER — Ambulatory Visit: Payer: Self-pay | Admitting: Neurology

## 2017-01-12 ENCOUNTER — Encounter: Payer: Self-pay | Admitting: Neurology

## 2017-01-12 ENCOUNTER — Encounter: Payer: Self-pay | Admitting: Internal Medicine

## 2017-01-13 ENCOUNTER — Encounter: Payer: Self-pay | Admitting: Internal Medicine

## 2017-01-13 ENCOUNTER — Other Ambulatory Visit: Payer: Self-pay | Admitting: Internal Medicine

## 2017-01-13 MED ORDER — LIDOCAINE 0.5 % EX GEL: 1.0000 "application " | Freq: Three times a day (TID) | CUTANEOUS | 6 refills | Status: DC | PRN

## 2017-01-19 ENCOUNTER — Other Ambulatory Visit: Payer: Self-pay | Admitting: Neurological Surgery

## 2017-01-19 DIAGNOSIS — M48061 Spinal stenosis, lumbar region without neurogenic claudication: Secondary | ICD-10-CM | POA: Diagnosis not present

## 2017-01-19 DIAGNOSIS — I10 Essential (primary) hypertension: Secondary | ICD-10-CM | POA: Diagnosis not present

## 2017-01-20 ENCOUNTER — Other Ambulatory Visit: Payer: Self-pay | Admitting: Neurology

## 2017-01-27 ENCOUNTER — Encounter: Payer: Self-pay | Admitting: Internal Medicine

## 2017-01-27 ENCOUNTER — Encounter: Payer: Self-pay | Admitting: Neurology

## 2017-01-29 NOTE — Pre-Procedure Instructions (Signed)
Joshuajames Moehring Quant  01/29/2017      [id number] [place], [place] - [address] [place] [address] Phone: [phone] Fax: [phone]    Your procedure is scheduled on Friday, Oct. 19th   Report to [facility] Admitting at 5:30 AM   Call this number if you have problems the [facility] of surgery:  [phone].   Remember:              4-5 days prior to surgery, STOP taking any Vitamins, Herbal Supplements, Anti-inflammatories, Blood Thinners.   Do not eat food or drink liquids after midnight Thursday, however..........   Take these medicines the morning of surgery with A SIP OF WATER : Sinemet IR, Prozac, MSIR (for severe pain), Flomax   Do not wear jewelry - no rings or watches.  Do not wear lotions, colognes, or deoderant.             Men may shave face and neck.   Do not bring valuables to the hospital.  [facility] is not responsible for any belongings or valuables.  Contacts, dentures or bridgework may not be worn into surgery.  Leave your suitcase in the car.  After surgery it may be brought to your room. For patients admitted to the hospital, discharge time will be determined by your treatment team.  Please read over the following fact sheets that you were given. Pain Booklet, MRSA Information and Surgical Site Infection Prevention      [place]- Preparing For Surgery  Before surgery, you can play an important role. Because skin is not sterile, your skin needs to be as free of germs as possible. You can reduce the number of germs on your skin by washing with CHG (chlorahexidine gluconate) Soap before surgery.  CHG is an antiseptic cleaner which kills germs and bonds with the skin to continue killing germs even after washing.  Please do not use if you have an allergy to CHG or antibacterial soaps. If your skin becomes reddened/irritated stop using the CHG.  Do not shave (including legs and underarms) for at  least 48 hours prior to first CHG shower. It is OK to shave your face.  Please follow these instructions carefully.   1. Shower the NIGHT BEFORE SURGERY and the MORNING OF SURGERY with CHG.   2. If you chose to wash your hair, wash your hair first as usual with your normal shampoo.  3. After you shampoo, rinse your hair and body thoroughly to remove the shampoo.  4. Use CHG as you would any other liquid soap. You can apply CHG directly to the skin and wash gently with a scrungie or a clean washcloth.   5. Apply the CHG Soap to your body ONLY FROM THE NECK DOWN.  Do not use on open wounds or open sores. Avoid contact with your eyes, ears, mouth and genitals (private parts). Wash Face and genitals (private parts)  with your normal soap.  6. Wash thoroughly, paying special attention to the area where your surgery will be performed.  7. Thoroughly rinse your body with warm water from the neck down.  8. DO NOT shower/wash with your normal soap after using and rinsing off the CHG Soap.  9. Pat yourself dry with a CLEAN TOWEL.  10. Wear CLEAN PAJAMAS to bed the night before surgery, wear comfortable clothes the morning of surgery  11. Place CLEAN SHEETS on your bed the night of your  first shower and DO NOT SLEEP WITH PETS.    Day of Surgery: Do not apply any deodorants/lotions. Please wear clean clothes to the hospital/surgery center.

## 2017-02-01 ENCOUNTER — Encounter (HOSPITAL_COMMUNITY)
Admission: RE | Admit: 2017-02-01 | Discharge: 2017-02-01 | Disposition: A | Discharge status: Home / Self Care | Payer: Medicare Other | Source: Ambulatory Visit | Attending: Neurological Surgery | Admitting: Neurological Surgery

## 2017-02-01 ENCOUNTER — Ambulatory Visit (HOSPITAL_COMMUNITY)
Admission: RE | Admit: 2017-02-01 | Discharge: 2017-02-01 | Disposition: A | Discharge status: Home / Self Care | Payer: Medicare Other | Source: Ambulatory Visit | Attending: Neurological Surgery | Admitting: Neurological Surgery

## 2017-02-01 ENCOUNTER — Encounter (HOSPITAL_COMMUNITY): Payer: Self-pay

## 2017-02-01 DIAGNOSIS — M48061 Spinal stenosis, lumbar region without neurogenic claudication: Secondary | ICD-10-CM | POA: Diagnosis not present

## 2017-02-01 DIAGNOSIS — Z01818 Encounter for other preprocedural examination: Secondary | ICD-10-CM | POA: Insufficient documentation

## 2017-02-01 DIAGNOSIS — Z01812 Encounter for preprocedural laboratory examination: Secondary | ICD-10-CM | POA: Insufficient documentation

## 2017-02-01 DIAGNOSIS — I700 Atherosclerosis of aorta: Secondary | ICD-10-CM | POA: Diagnosis not present

## 2017-02-01 HISTORY — DX: Anxiety disorder, unspecified: F41.9

## 2017-02-01 HISTORY — DX: Personal history of urinary calculi: Z87.442

## 2017-02-01 LAB — PROTIME-INR
INR: 1.1
PROTHROMBIN TIME: 14.2 s (ref 11.4–15.2)

## 2017-02-01 LAB — CBC WITH DIFFERENTIAL/PLATELET
BASOS ABS: 0 10*3/uL (ref 0.0–0.1)
BASOS PCT: 1 %
EOS ABS: 0.1 10*3/uL (ref 0.0–0.7)
EOS PCT: 1 %
HCT: 39.2 % (ref 39.0–52.0)
HEMOGLOBIN: 12.8 g/dL — AB (ref 13.0–17.0)
Lymphocytes Relative: 16 %
Lymphs Abs: 1 10*3/uL (ref 0.7–4.0)
MCH: 30.8 pg (ref 26.0–34.0)
MCHC: 32.7 g/dL (ref 30.0–36.0)
MCV: 94.2 fL (ref 78.0–100.0)
Monocytes Absolute: 0.7 10*3/uL (ref 0.1–1.0)
Monocytes Relative: 11 %
Neutro Abs: 4.4 10*3/uL (ref 1.7–7.7)
Neutrophils Relative %: 71 %
PLATELETS: 255 10*3/uL (ref 150–400)
RBC: 4.16 MIL/uL — AB (ref 4.22–5.81)
RDW: 13.6 % (ref 11.5–15.5)
WBC: 6.1 10*3/uL (ref 4.0–10.5)

## 2017-02-01 LAB — BASIC METABOLIC PANEL
Anion gap: 9 (ref 5–15)
BUN: 19 mg/dL (ref 6–20)
CALCIUM: 9.3 mg/dL (ref 8.9–10.3)
CHLORIDE: 104 mmol/L (ref 101–111)
CO2: 24 mmol/L (ref 22–32)
CREATININE: 0.77 mg/dL (ref 0.61–1.24)
Glucose, Bld: 112 mg/dL — ABNORMAL HIGH (ref 65–99)
Potassium: 3.6 mmol/L (ref 3.5–5.1)
SODIUM: 137 mmol/L (ref 135–145)

## 2017-02-01 LAB — SURGICAL PCR SCREEN
MRSA, PCR: NEGATIVE
STAPHYLOCOCCUS AUREUS: NEGATIVE

## 2017-02-01 NOTE — Progress Notes (Signed)
PCP is Dr. Debbora Dus  LOV 12/2016 Neurologist is Dr. Wells Guiles Tat  LOV 07/2016 Denies any cardiac issues, no murmur, no palpitations, no cp.   He had sleep study done aprrox 10 yrs ago.  "mild" case Dx with Parkinson's in 2005

## 2017-02-05 ENCOUNTER — Inpatient Hospital Stay (HOSPITAL_COMMUNITY): Payer: Medicare Other

## 2017-02-05 ENCOUNTER — Encounter (HOSPITAL_COMMUNITY)
Admission: RE | Disposition: A | Discharge status: Home / Self Care | Payer: Self-pay | Source: Ambulatory Visit | Attending: Neurological Surgery

## 2017-02-05 ENCOUNTER — Observation Stay (HOSPITAL_COMMUNITY)
Admission: RE | Admit: 2017-02-05 | Discharge: 2017-02-06 | Disposition: A | Discharge status: Home / Self Care | Payer: Medicare Other | Source: Ambulatory Visit | Attending: Neurological Surgery | Admitting: Neurological Surgery

## 2017-02-05 ENCOUNTER — Inpatient Hospital Stay (HOSPITAL_COMMUNITY): Payer: Medicare Other | Admitting: Certified Registered Nurse Anesthetist

## 2017-02-05 ENCOUNTER — Encounter (HOSPITAL_COMMUNITY): Payer: Self-pay | Admitting: General Practice

## 2017-02-05 DIAGNOSIS — G4733 Obstructive sleep apnea (adult) (pediatric): Secondary | ICD-10-CM | POA: Diagnosis not present

## 2017-02-05 DIAGNOSIS — M1990 Unspecified osteoarthritis, unspecified site: Secondary | ICD-10-CM | POA: Insufficient documentation

## 2017-02-05 DIAGNOSIS — Z419 Encounter for procedure for purposes other than remedying health state, unspecified: Secondary | ICD-10-CM

## 2017-02-05 DIAGNOSIS — Z87891 Personal history of nicotine dependence: Secondary | ICD-10-CM | POA: Insufficient documentation

## 2017-02-05 DIAGNOSIS — Z9889 Other specified postprocedural states: Secondary | ICD-10-CM

## 2017-02-05 DIAGNOSIS — M48061 Spinal stenosis, lumbar region without neurogenic claudication: Principal | ICD-10-CM | POA: Insufficient documentation

## 2017-02-05 DIAGNOSIS — G4730 Sleep apnea, unspecified: Secondary | ICD-10-CM | POA: Diagnosis not present

## 2017-02-05 DIAGNOSIS — I10 Essential (primary) hypertension: Secondary | ICD-10-CM | POA: Diagnosis not present

## 2017-02-05 DIAGNOSIS — G20 Parkinson's disease: Secondary | ICD-10-CM | POA: Insufficient documentation

## 2017-02-05 DIAGNOSIS — Z79899 Other long term (current) drug therapy: Secondary | ICD-10-CM | POA: Diagnosis not present

## 2017-02-05 DIAGNOSIS — E785 Hyperlipidemia, unspecified: Secondary | ICD-10-CM | POA: Diagnosis not present

## 2017-02-05 DIAGNOSIS — M48062 Spinal stenosis, lumbar region with neurogenic claudication: Secondary | ICD-10-CM | POA: Diagnosis not present

## 2017-02-05 HISTORY — PX: LUMBAR LAMINECTOMY/DECOMPRESSION MICRODISCECTOMY: SHX5026

## 2017-02-05 SURGERY — LUMBAR LAMINECTOMY/DECOMPRESSION MICRODISCECTOMY 3 LEVELS
Anesthesia: General | Site: Spine Lumbar

## 2017-02-05 MED ORDER — EPHEDRINE 5 MG/ML INJ
INTRAVENOUS | Status: AC
  Filled 2017-02-05: qty 10

## 2017-02-05 MED ORDER — CHLORHEXIDINE GLUCONATE CLOTH 2 % EX PADS: 6.0000 | MEDICATED_PAD | Freq: Once | CUTANEOUS | Status: DC

## 2017-02-05 MED ORDER — HEMOSTATIC AGENTS (NO CHARGE) OPTIME
TOPICAL | Status: DC | PRN
  Administered 2017-02-05: 1 via TOPICAL

## 2017-02-05 MED ORDER — TAMSULOSIN HCL 0.4 MG PO CAPS
0.4000 mg | ORAL_CAPSULE | Freq: Every day | ORAL | Status: DC
  Administered 2017-02-05: 0.4 mg via ORAL
  Filled 2017-02-05: qty 1

## 2017-02-05 MED ORDER — PHENOL 1.4 % MT LIQD: 1.0000 | OROMUCOSAL | Status: DC | PRN

## 2017-02-05 MED ORDER — SUCCINYLCHOLINE CHLORIDE 200 MG/10ML IV SOSY
PREFILLED_SYRINGE | INTRAVENOUS | Status: DC | PRN
  Administered 2017-02-05: 100 mg via INTRAVENOUS

## 2017-02-05 MED ORDER — DEXAMETHASONE SODIUM PHOSPHATE 10 MG/ML IJ SOLN
INTRAMUSCULAR | Status: DC | PRN
  Administered 2017-02-05: 10 mg via INTRAVENOUS

## 2017-02-05 MED ORDER — EPHEDRINE SULFATE-NACL 50-0.9 MG/10ML-% IV SOSY
PREFILLED_SYRINGE | INTRAVENOUS | Status: DC | PRN
  Administered 2017-02-05: 5 mg via INTRAVENOUS
  Administered 2017-02-05: 10 mg via INTRAVENOUS
  Administered 2017-02-05 (×2): 5 mg via INTRAVENOUS

## 2017-02-05 MED ORDER — MIRABEGRON ER 25 MG PO TB24
25.0000 mg | ORAL_TABLET | ORAL | Status: DC
  Administered 2017-02-05: 25 mg via ORAL
  Filled 2017-02-05 (×2): qty 1

## 2017-02-05 MED ORDER — FENTANYL CITRATE (PF) 250 MCG/5ML IJ SOLN
INTRAMUSCULAR | Status: AC
  Filled 2017-02-05: qty 5

## 2017-02-05 MED ORDER — THROMBIN (RECOMBINANT) 5000 UNITS EX SOLR
OROMUCOSAL | Status: DC | PRN
  Administered 2017-02-05: 5 mL via TOPICAL

## 2017-02-05 MED ORDER — MENTHOL 3 MG MT LOZG: 1.0000 | LOZENGE | OROMUCOSAL | Status: DC | PRN

## 2017-02-05 MED ORDER — DEXAMETHASONE SODIUM PHOSPHATE 10 MG/ML IJ SOLN
INTRAMUSCULAR | Status: AC
  Filled 2017-02-05: qty 1

## 2017-02-05 MED ORDER — PHENYLEPHRINE HCL 10 MG/ML IJ SOLN
INTRAVENOUS | Status: DC | PRN
  Administered 2017-02-05: 15 ug/min via INTRAVENOUS

## 2017-02-05 MED ORDER — SUGAMMADEX SODIUM 200 MG/2ML IV SOLN
INTRAVENOUS | Status: AC
  Filled 2017-02-05: qty 2

## 2017-02-05 MED ORDER — 0.9 % SODIUM CHLORIDE (POUR BTL) OPTIME
TOPICAL | Status: DC | PRN
  Administered 2017-02-05: 1000 mL

## 2017-02-05 MED ORDER — ACETAMINOPHEN 650 MG RE SUPP: 650.0000 mg | RECTAL | Status: DC | PRN

## 2017-02-05 MED ORDER — LIDOCAINE 2% (20 MG/ML) 5 ML SYRINGE
INTRAMUSCULAR | Status: AC
  Filled 2017-02-05: qty 5

## 2017-02-05 MED ORDER — ROCURONIUM BROMIDE 10 MG/ML (PF) SYRINGE
PREFILLED_SYRINGE | INTRAVENOUS | Status: DC | PRN
  Administered 2017-02-05: 50 mg via INTRAVENOUS
  Administered 2017-02-05: 20 mg via INTRAVENOUS

## 2017-02-05 MED ORDER — FENTANYL CITRATE (PF) 100 MCG/2ML IJ SOLN: 25.0000 ug | INTRAMUSCULAR | Status: DC | PRN

## 2017-02-05 MED ORDER — PHENYLEPHRINE 40 MCG/ML (10ML) SYRINGE FOR IV PUSH (FOR BLOOD PRESSURE SUPPORT)
PREFILLED_SYRINGE | INTRAVENOUS | Status: DC | PRN
  Administered 2017-02-05: 80 ug via INTRAVENOUS

## 2017-02-05 MED ORDER — BUPIVACAINE HCL (PF) 0.25 % IJ SOLN
INTRAMUSCULAR | Status: DC | PRN
  Administered 2017-02-05: 2 mL

## 2017-02-05 MED ORDER — LOSARTAN POTASSIUM 50 MG PO TABS
100.0000 mg | ORAL_TABLET | Freq: Every day | ORAL | Status: DC
  Administered 2017-02-05: 100 mg via ORAL
  Filled 2017-02-05: qty 2

## 2017-02-05 MED ORDER — HYDROMORPHONE HCL 1 MG/ML IJ SOLN
0.2500 mg | INTRAMUSCULAR | Status: DC | PRN
  Administered 2017-02-05: 0.5 mg via INTRAVENOUS
  Administered 2017-02-05 (×2): 0.25 mg via INTRAVENOUS

## 2017-02-05 MED ORDER — DEXAMETHASONE SODIUM PHOSPHATE 10 MG/ML IJ SOLN
10.0000 mg | INTRAMUSCULAR | Status: DC
  Filled 2017-02-05: qty 1

## 2017-02-05 MED ORDER — ONDANSETRON HCL 4 MG/2ML IJ SOLN
INTRAMUSCULAR | Status: AC
  Filled 2017-02-05: qty 2

## 2017-02-05 MED ORDER — CARBIDOPA-LEVODOPA ER 50-200 MG PO TBCR
1.0000 | EXTENDED_RELEASE_TABLET | Freq: Every day | ORAL | Status: DC
  Administered 2017-02-05: 1 via ORAL
  Filled 2017-02-05: qty 1

## 2017-02-05 MED ORDER — ONDANSETRON HCL 4 MG/2ML IJ SOLN
INTRAMUSCULAR | Status: DC | PRN
  Administered 2017-02-05: 4 mg via INTRAVENOUS

## 2017-02-05 MED ORDER — SODIUM CHLORIDE 0.9 % IR SOLN
Status: DC | PRN
  Administered 2017-02-05: 500 mL

## 2017-02-05 MED ORDER — DOCUSATE SODIUM 100 MG PO CAPS: 100.0000 mg | ORAL_CAPSULE | Freq: Two times a day (BID) | ORAL | Status: DC | PRN

## 2017-02-05 MED ORDER — PHENYLEPHRINE 40 MCG/ML (10ML) SYRINGE FOR IV PUSH (FOR BLOOD PRESSURE SUPPORT)
PREFILLED_SYRINGE | INTRAVENOUS | Status: AC
  Filled 2017-02-05: qty 10

## 2017-02-05 MED ORDER — ONDANSETRON HCL 4 MG PO TABS: 4.0000 mg | ORAL_TABLET | Freq: Four times a day (QID) | ORAL | Status: DC | PRN

## 2017-02-05 MED ORDER — MORPHINE SULFATE 15 MG PO TABS
15.0000 mg | ORAL_TABLET | Freq: Three times a day (TID) | ORAL | Status: DC | PRN
  Administered 2017-02-05: 15 mg via ORAL
  Filled 2017-02-05: qty 1

## 2017-02-05 MED ORDER — CEFAZOLIN SODIUM-DEXTROSE 2-4 GM/100ML-% IV SOLN
2.0000 g | Freq: Three times a day (TID) | INTRAVENOUS | Status: AC
  Administered 2017-02-05 (×2): 2 g via INTRAVENOUS
  Filled 2017-02-05 (×2): qty 100

## 2017-02-05 MED ORDER — LIDOCAINE 2% (20 MG/ML) 5 ML SYRINGE
INTRAMUSCULAR | Status: DC | PRN
  Administered 2017-02-05: 60 mg via INTRAVENOUS

## 2017-02-05 MED ORDER — FENTANYL CITRATE (PF) 100 MCG/2ML IJ SOLN
INTRAMUSCULAR | Status: DC | PRN
  Administered 2017-02-05 (×3): 50 ug via INTRAVENOUS

## 2017-02-05 MED ORDER — SODIUM CHLORIDE 0.9% FLUSH: 3.0000 mL | INTRAVENOUS | Status: DC | PRN

## 2017-02-05 MED ORDER — PROPOFOL 10 MG/ML IV BOLUS
INTRAVENOUS | Status: DC | PRN
  Administered 2017-02-05: 150 mg via INTRAVENOUS

## 2017-02-05 MED ORDER — THROMBIN 5000 UNITS EX SOLR
CUTANEOUS | Status: DC | PRN
  Administered 2017-02-05 (×2): 5000 [IU] via TOPICAL

## 2017-02-05 MED ORDER — POTASSIUM CHLORIDE IN NACL 20-0.9 MEQ/L-% IV SOLN: INTRAVENOUS | Status: DC

## 2017-02-05 MED ORDER — PROPOFOL 10 MG/ML IV BOLUS
INTRAVENOUS | Status: AC
  Filled 2017-02-05: qty 40

## 2017-02-05 MED ORDER — CEFAZOLIN SODIUM-DEXTROSE 2-4 GM/100ML-% IV SOLN
2.0000 g | INTRAVENOUS | Status: AC
  Administered 2017-02-05: 2 g via INTRAVENOUS
  Filled 2017-02-05: qty 100

## 2017-02-05 MED ORDER — SODIUM CHLORIDE 0.9% FLUSH
3.0000 mL | Freq: Two times a day (BID) | INTRAVENOUS | Status: DC
  Administered 2017-02-05 (×2): 3 mL via INTRAVENOUS

## 2017-02-05 MED ORDER — SUGAMMADEX SODIUM 200 MG/2ML IV SOLN
INTRAVENOUS | Status: DC | PRN
  Administered 2017-02-05: 200 mg via INTRAVENOUS

## 2017-02-05 MED ORDER — POLYETHYLENE GLYCOL 3350 17 G PO PACK: 17.0000 g | PACK | Freq: Every day | ORAL | Status: DC | PRN

## 2017-02-05 MED ORDER — HYDROMORPHONE HCL 1 MG/ML IJ SOLN
INTRAMUSCULAR | Status: AC
  Filled 2017-02-05: qty 1

## 2017-02-05 MED ORDER — BUPIVACAINE HCL (PF) 0.25 % IJ SOLN
INTRAMUSCULAR | Status: AC
  Filled 2017-02-05: qty 30

## 2017-02-05 MED ORDER — LACTATED RINGERS IV SOLN
INTRAVENOUS | Status: DC | PRN
  Administered 2017-02-05: 07:00:00 via INTRAVENOUS

## 2017-02-05 MED ORDER — CARBIDOPA-LEVODOPA 25-100 MG PO TABS
2.0000 | ORAL_TABLET | Freq: Four times a day (QID) | ORAL | Status: DC
  Administered 2017-02-05 – 2017-02-06 (×3): 2 via ORAL
  Filled 2017-02-05 (×5): qty 2

## 2017-02-05 MED ORDER — CELECOXIB 200 MG PO CAPS
200.0000 mg | ORAL_CAPSULE | Freq: Two times a day (BID) | ORAL | Status: DC
  Administered 2017-02-05 (×2): 200 mg via ORAL
  Filled 2017-02-05 (×2): qty 1

## 2017-02-05 MED ORDER — ONDANSETRON HCL 4 MG/2ML IJ SOLN: 4.0000 mg | Freq: Four times a day (QID) | INTRAMUSCULAR | Status: DC | PRN

## 2017-02-05 MED ORDER — ROCURONIUM BROMIDE 10 MG/ML (PF) SYRINGE
PREFILLED_SYRINGE | INTRAVENOUS | Status: AC
  Filled 2017-02-05: qty 5

## 2017-02-05 MED ORDER — HYDROMORPHONE HCL 1 MG/ML IJ SOLN: 0.2500 mg | INTRAMUSCULAR | Status: DC | PRN

## 2017-02-05 MED ORDER — METHOCARBAMOL 500 MG PO TABS
500.0000 mg | ORAL_TABLET | Freq: Four times a day (QID) | ORAL | Status: DC | PRN
  Administered 2017-02-05: 500 mg via ORAL
  Filled 2017-02-05: qty 1

## 2017-02-05 MED ORDER — DEXTROSE 5 % IV SOLN
500.0000 mg | Freq: Four times a day (QID) | INTRAVENOUS | Status: DC | PRN
  Filled 2017-02-05: qty 5

## 2017-02-05 MED ORDER — FLUOXETINE HCL 20 MG PO CAPS
30.0000 mg | ORAL_CAPSULE | Freq: Every day | ORAL | Status: DC
  Filled 2017-02-05: qty 1

## 2017-02-05 MED ORDER — HYDROCODONE-ACETAMINOPHEN 7.5-325 MG PO TABS
1.0000 | ORAL_TABLET | Freq: Four times a day (QID) | ORAL | Status: DC
  Administered 2017-02-05: 1 via ORAL
  Filled 2017-02-05 (×2): qty 1

## 2017-02-05 MED ORDER — ACETAMINOPHEN 325 MG PO TABS: 650.0000 mg | ORAL_TABLET | ORAL | Status: DC | PRN

## 2017-02-05 SURGICAL SUPPLY — 50 items
APL SKNCLS STERI-STRIP NONHPOA (GAUZE/BANDAGES/DRESSINGS) ×1
BAG DECANTER FOR FLEXI CONT (MISCELLANEOUS) ×2 IMPLANT
BENZOIN TINCTURE PRP APPL 2/3 (GAUZE/BANDAGES/DRESSINGS) ×2 IMPLANT
BUR MATCHSTICK NEURO 3.0 LAGG (BURR) ×2 IMPLANT
CANISTER SUCT 3000ML PPV (MISCELLANEOUS) ×2 IMPLANT
CARTRIDGE OIL MAESTRO DRILL (MISCELLANEOUS) ×1 IMPLANT
DIFFUSER DRILL AIR PNEUMATIC (MISCELLANEOUS) ×2 IMPLANT
DRAPE LAPAROTOMY 100X72X124 (DRAPES) ×2 IMPLANT
DRAPE MICROSCOPE LEICA (MISCELLANEOUS) ×1 IMPLANT
DRAPE POUCH INSTRU U-SHP 10X18 (DRAPES) ×2 IMPLANT
DRAPE SURG 17X23 STRL (DRAPES) ×2 IMPLANT
DRSG OPSITE 4X5.5 SM (GAUZE/BANDAGES/DRESSINGS) ×2 IMPLANT
DRSG OPSITE POSTOP 4X6 (GAUZE/BANDAGES/DRESSINGS) ×1 IMPLANT
DURAPREP 26ML APPLICATOR (WOUND CARE) ×2 IMPLANT
ELECT REM PT RETURN 9FT ADLT (ELECTROSURGICAL) ×2
ELECTRODE REM PT RTRN 9FT ADLT (ELECTROSURGICAL) ×1 IMPLANT
GAUZE SPONGE 4X4 16PLY XRAY LF (GAUZE/BANDAGES/DRESSINGS) IMPLANT
GLOVE BIO SURGEON STRL SZ7 (GLOVE) ×1 IMPLANT
GLOVE BIO SURGEON STRL SZ8 (GLOVE) ×2 IMPLANT
GLOVE BIOGEL PI IND STRL 7.0 (GLOVE) IMPLANT
GLOVE BIOGEL PI IND STRL 7.5 (GLOVE) IMPLANT
GLOVE BIOGEL PI INDICATOR 7.0 (GLOVE) ×2
GLOVE BIOGEL PI INDICATOR 7.5 (GLOVE) ×1
GLOVE SURG SS PI 7.5 STRL IVOR (GLOVE) ×2 IMPLANT
GOWN STRL REUS W/ TWL LRG LVL3 (GOWN DISPOSABLE) IMPLANT
GOWN STRL REUS W/ TWL XL LVL3 (GOWN DISPOSABLE) ×1 IMPLANT
GOWN STRL REUS W/TWL 2XL LVL3 (GOWN DISPOSABLE) IMPLANT
GOWN STRL REUS W/TWL LRG LVL3 (GOWN DISPOSABLE) ×4
GOWN STRL REUS W/TWL XL LVL3 (GOWN DISPOSABLE) ×2
HEMOSTAT POWDER KIT SURGIFOAM (HEMOSTASIS) ×1 IMPLANT
KIT BASIN OR (CUSTOM PROCEDURE TRAY) ×2 IMPLANT
KIT ROOM TURNOVER OR (KITS) ×2 IMPLANT
NDL HYPO 25X1 1.5 SAFETY (NEEDLE) ×1 IMPLANT
NDL SPNL 20GX3.5 QUINCKE YW (NEEDLE) IMPLANT
NEEDLE HYPO 25X1 1.5 SAFETY (NEEDLE) ×2 IMPLANT
NEEDLE SPNL 20GX3.5 QUINCKE YW (NEEDLE) ×2 IMPLANT
NS IRRIG 1000ML POUR BTL (IV SOLUTION) ×2 IMPLANT
OIL CARTRIDGE MAESTRO DRILL (MISCELLANEOUS) ×2
PACK LAMINECTOMY NEURO (CUSTOM PROCEDURE TRAY) ×2 IMPLANT
PAD ARMBOARD 7.5X6 YLW CONV (MISCELLANEOUS) ×8 IMPLANT
RUBBERBAND STERILE (MISCELLANEOUS) ×4 IMPLANT
SPONGE SURGIFOAM ABS GEL SZ50 (HEMOSTASIS) ×2 IMPLANT
STRIP CLOSURE SKIN 1/2X4 (GAUZE/BANDAGES/DRESSINGS) ×2 IMPLANT
SUT VIC AB 0 CT1 18XCR BRD8 (SUTURE) ×1 IMPLANT
SUT VIC AB 0 CT1 8-18 (SUTURE) ×4
SUT VIC AB 2-0 CP2 18 (SUTURE) ×3 IMPLANT
SUT VIC AB 3-0 SH 8-18 (SUTURE) ×3 IMPLANT
TOWEL GREEN STERILE (TOWEL DISPOSABLE) ×2 IMPLANT
TOWEL GREEN STERILE FF (TOWEL DISPOSABLE) ×2 IMPLANT
WATER STERILE IRR 1000ML POUR (IV SOLUTION) ×2 IMPLANT

## 2017-02-05 NOTE — Op Note (Signed)
02/05/2017  9:36 AM  PATIENT:  [name]  83 y.o. male  PRE-OPERATIVE DIAGNOSIS:  Lumbar spinal stenosis L1-2 L2-3 L3-4, neurogenic claudication  POST-OPERATIVE DIAGNOSIS:  same  PROCEDURE:  Decompressive lumbar laminectomy, medial facetectomy and foraminotomy L2-3 bilaterally with undercutting of L1-2 with partial medial facetectomy, and a left L3-4 decompressive hemilaminectomy, medial facetectomy and foraminotomy  SURGEON:  Sherley Bounds, MD  ASSISTANTSShelba Flake FNP  ANESTHESIA:   General  EBL: 25 ml  Total I/O In: -  Out: 50 [Blood:50]  BLOOD ADMINISTERED: none  DRAINS: none  SPECIMEN:  none  INDICATION FOR PROCEDURE: This patient presented with neurogenic claudication. Imaging showed lumbar spinal stenosis L1-L3. The patient tried conservative measures without relief. Pain was debilitating. Recommended decompressive laminectomy. Patient understood the risks, benefits, and alternatives and potential outcomes and wished to proceed.  PROCEDURE DETAILS: The patient was taken to the operating room and after induction of adequate generalized endotracheal anesthesia, the patient was rolled into the prone position on the Wilson frame and all pressure points were padded. The lumbar region was cleaned and then prepped with DuraPrep and draped in the usual sterile fashion. 5 cc of local anesthesia was injected and then a dorsal midline incision was made and carried down to the lumbo sacral fascia. The fascia was opened and the paraspinous musculature was taken down in a subperiosteal fashion to expose L1-L3. Intraoperative x-ray confirmed my level, and then I used a combination of the high-speed drill and the Kerrison punches to perform a hemilaminectomy, medial facetectomy, and foraminotomy at L3-4 on the left, with a complete laminectomy bilaterally with medial facetectomy and foraminotomy L2-3 with undercutting of L1-2 with medial facetectomy especially on the right. The underlying  yellow ligament was opened and removed in a piecemeal fashion to expose the underlying dura and exiting nerve root. I undercut the lateral recess and dissected down until I was medial to and distal to the pedicle, especially at L2-3 and L3-4 on the left. The nerve root was well decompressed at all levels. I then palpated with a coronary dilator along the nerve root and into the foramen to assure adequate decompression. I felt no more compression of the nerve root and the central canal appeared to be very well decompressed at L1-2 and L2-3. I irrigated with saline solution containing bacitracin. Achieved hemostasis with bipolar cautery, lined the dura with Gelfoam, and then closed the fascia with 0 Vicryl. I closed the subcutaneous tissues with 2-0 Vicryl and the subcuticular tissues with 3-0 Vicryl. The skin was then closed with benzoin and Steri-Strips. The drapes were removed, a sterile dressing was applied. The patient was awakened from general anesthesia and transferred to the recovery room in stable condition. At the end of the procedure all sponge, needle and instrument counts were correct.    PLAN OF CARE: Admit for overnight observation  PATIENT DISPOSITION:  PACU - hemodynamically stable.   Delay start of Pharmacological VTE agent (>24hrs) due to surgical blood loss or risk of bleeding:  yes

## 2017-02-05 NOTE — Anesthesia Preprocedure Evaluation (Addendum)
Anesthesia Evaluation  Patient identified by MRN, date of birth, ID band Patient awake    Reviewed: Allergy & Precautions, NPO status , Patient's Chart, lab work & pertinent test results  Airway Mallampati: II  TM Distance: >3 FB     Dental   Pulmonary sleep apnea , former smoker,    breath sounds clear to auscultation       Cardiovascular hypertension,  Rhythm:Regular Rate:Normal     Neuro/Psych    GI/Hepatic Neg liver ROS, GERD  ,  Endo/Other  negative endocrine ROS  Renal/GU negative Renal ROS     Musculoskeletal  (+) Arthritis ,   Abdominal   Peds  Hematology   Anesthesia Other Findings   Reproductive/Obstetrics                             Anesthesia Physical Anesthesia Plan  ASA: III  Anesthesia Plan: General   Post-op Pain Management:    Induction: Intravenous  PONV Risk Score and Plan: 2 and Ondansetron, Dexamethasone and Treatment may vary due to age or medical condition  Airway Management Planned: Oral ETT  Additional Equipment:   Intra-op Plan:   Post-operative Plan: Possible Post-op intubation/ventilation  Informed Consent: I have reviewed the patients History and Physical, chart, labs and discussed the procedure including the risks, benefits and alternatives for the proposed anesthesia with the patient or authorized representative who has indicated his/her understanding and acceptance.   Dental advisory given  Plan Discussed with: CRNA, Anesthesiologist and Surgeon  Anesthesia Plan Comments:        Anesthesia Quick Evaluation

## 2017-02-05 NOTE — Anesthesia Procedure Notes (Signed)
Procedure Name: Intubation Date/Time: 02/05/2017 7:42 AM Performed by: Everlean Cherry A Pre-anesthesia Checklist: Patient identified, Emergency Drugs available, Suction available and Patient being monitored Patient Re-evaluated:Patient Re-evaluated prior to induction Oxygen Delivery Method: Circle system utilized Preoxygenation: Pre-oxygenation with 100% oxygen Induction Type: IV induction Ventilation: Mask ventilation without difficulty Laryngoscope Size: Miller and 2 Grade View: Grade I Tube size: 7.5 mm Number of attempts: 1 Airway Equipment and Method: Stylet Placement Confirmation: ETT inserted through vocal cords under direct vision,  positive ETCO2 and breath sounds checked- equal and bilateral Secured at: 24 cm Tube secured with: Tape Dental Injury: Teeth and Oropharynx as per pre-operative assessment

## 2017-02-05 NOTE — H&P (Signed)
Subjective: Patient is a 83 y.o. male admitted for spinal stenosis. Onset of symptoms was several months ago, gradually worsening since that time.  The pain is rated severe, and is located at the across the lower back and radiates to legs. The pain is described as aching and occurs all day. The symptoms have been progressive. Symptoms are exacerbated by exercise. MRI or CT showed stenosis L1-3   Past Medical History:  Diagnosis Date  . Allergic rhinitis   . Anxiety   . Bladder cancer [facility])    urologist-  dr eskridge--  low grade TA  . BPH with elevated PSA   . DDD (degenerative disc disease), lumbar   . Depression   . Dry mouth    USES BIOTIN SPRAY/ MOUTHWASH  . Frequency of urination   . GERD (gastroesophageal reflux disease)   . Hearing loss    does not wear his hearing aid  . History of kidney stones    10/1989  . Hyperlipidemia   . Hypertension   . Mild obstructive sleep apnea    PER PT  MILD OSA , NO CPAP RX,  STUDY DONE 2009  . Nocturia   . OAB (overactive bladder)   . Osteoarthritis    KNEES, SHOULDER  . Parkinson disease ([place]) DX   2005   NEUROLOGIST-   DR TAT--  IDIOPATHIC PARKINSON'S /  TREMORS CONTROLLED WITH MEDS  . Urge urinary incontinence     Past Surgical History:  Procedure Laterality Date  . CATARACT EXTRACTION W/ INTRAOCULAR LENS  IMPLANT, BILATERAL  2014  . CYSTOSCOPY W/ RETROGRADES Bilateral 12/14/2014   Procedure: CYSTOSCOPY BLADDER BIOPSY FULGERATION MITOMYCIN C BILATERAL RETROGRADE PYELOGRAM;  Surgeon: Festus Aloe, MD;  Location: [facility];  Service: Urology;  Laterality: Bilateral;  . CYSTOSCOPY WITH STENT PLACEMENT Left 12/19/2013   Procedure: CYSTOSCOPY BLADDER BX, LEFT URETERAL STENT PLACEMENT , LEFT RETROGRADE AND PYLOGRAM;  Surgeon: Festus Aloe, MD;  Location: [facility];  Service: Urology;  Laterality: Left;  . EYE SURGERY    . FRACTURE SURGERY    . HERNIA REPAIR    . INGUINAL HERNIA REPAIR  03/09/2012    Procedure: HERNIA REPAIR INGUINAL ADULT;  Surgeon: Adin Hector, MD;  Location: [facility];  Service: General;  Laterality: Right;  . INSERTION OF MESH  03/09/2012   Procedure: INSERTION OF MESH;  Surgeon: Adin Hector, MD;  Location: [facility];  Service: General;  Laterality: Right;  . NASAL SINUS SURGERY  1982  . ORIF RIGHT ARM FX  1949   HARDWARE REMOVED   . REMOVAL BENIGN CYST LEFT FOREHEAD  1969  . TRANSURETHRAL RESECTION OF BLADDER TUMOR N/A 12/19/2013   Procedure:  TRANSURETHRAL RESECTION OF BLADDER TUMOR WITH GYRUS (TURBT-GYRUS);  Surgeon: Festus Aloe, MD;  Location[facility];  Service: Urology;  Laterality: N/A;    Prior to Admission medications   Medication Sig Start Date End Date Taking? Authorizing Provider  acetaminophen (TYLENOL) 500 MG tablet Take 500-1,000 mg by mouth every 6 (six) hours. 1000 mg in the morning and at bedtime  and 500 mg at noon, and  1600   Yes [provider]  atorvastatin (LIPITOR) 40 MG tablet Take 1 tablet (40 mg total) by mouth daily. Patient taking differently: Take 40 mg by mouth every evening. At 2000 11/23/16  Yes Paz, Alda Berthold, MD  Ca Carbonate-Mag Hydroxide (ROLAIDS PO) Take 2 tablets by mouth as needed (indigestion).    Yes [provider]  carbidopa-levodopa (SINEMET CR) 50-200 MG tablet TAKE 1 TABLET BY MOUTH AT BEDTIME. 09/07/16  Yes Tat, Eustace Quail, DO  carbidopa-levodopa (SINEMET IR) 25-100 MG tablet TAKE TWO TABLETS BY MOUTH FOUR TIMES DAILY 01/20/17  Yes Tat, Eustace Quail, DO  docusate sodium (COLACE) 100 MG capsule Take 100-200 mg by mouth 2 (two) times daily as needed for mild constipation (depending on constipation).   Yes [provider]  fexofenadine (ALLEGRA) 180 MG tablet Take 180 mg by mouth daily.    Yes [provider]  FLUoxetine (PROZAC) 10 MG capsule Take 3 capsules (30 mg total) by mouth daily. 12/28/16  Yes Paz, Alda Berthold, MD  fluticasone [facility]) 50 MCG/ACT nasal spray Place 2 sprays  into both nostrils daily. Patient taking differently: Place 2 sprays into both nostrils daily as needed for allergies.  01/06/16  Yes Paz, Jacqulyn Bath E, MD  Lidocaine 0.5 % GEL Apply 1 application topically 3 (three) times daily as needed. Patient taking differently: Apply 1 application topically 3 (three) times daily as needed (pain).  01/13/17  Yes Paz, Alda Berthold, MD  losartan (COZAAR) 100 MG tablet Take 1 tablet (100 mg total) by mouth daily. Patient taking differently: Take 100 mg by mouth daily at 12 noon.  10/12/16  Yes Paz, Alda Berthold, MD  morphine (MSIR) 15 MG tablet Take 15 mg by mouth 3 (three) times daily as needed for severe pain.   Yes [provider]  MYRBETRIQ 25 MG TB24 tablet Take 25 mg by mouth daily. At 1600 02/24/16  Yes [provider]  polyethylene glycol (MIRALAX / GLYCOLAX) packet Take 17 g by mouth daily as needed for mild constipation. For laxative   Yes [provider]  Probiotic Product (PROBIOTIC DAILY PO) Take 1 capsule by mouth daily.   Yes [provider]  tamsulosin (FLOMAX) 0.4 MG CAPS capsule Take 0.4 mg by mouth daily after supper.  05/01/16  Yes [provider]  HYDROcodone-acetaminophen (NORCO/VICODIN) 5-325 MG tablet Take 1-2 tablets by mouth 3 (three) times daily. Patient not taking: Reported on 01/27/2017 01/05/17   Colon Branch, MD  ketorolac (TORADOL) 10 MG tablet Take 1 tablet (10 mg total) by mouth every 6 (six) hours as needed for moderate pain. Patient not taking: Reported on 01/27/2017 01/04/17   Varney Biles, MD  NONFORMULARY OR COMPOUNDED [facility]:  Urea 40%, apply daily to affected areas. Patient not taking: Reported on 01/27/2017 12/13/15   Trula Slade, DPM  predniSONE (DELTASONE) 10 MG tablet Take 6 tablets (60 mg total) by mouth daily. Patient taking differently: Take 60 mg by mouth daily. As directed by MD 01/04/17   Varney Biles, MD   No Known Allergies  Social History  Substance Use Topics   . Smoking status: Former Smoker    Packs/day: 1.00    Years: 15.00    Types: Cigarettes    Quit date: 04/20/1968  . Smokeless tobacco: Never Used  . Alcohol use 4.2 oz/week    7 Glasses of wine per week     Comment: ONE WINE PER DAY    Family History  Problem Relation Age of Onset  . Alzheimer's disease Mother   . Liver cancer Father   . Cancer Father        colon  . Prostate cancer Neg Hx   . CAD Neg Hx      Review of Systems  Positive ROS: neg  All other systems have been reviewed and were otherwise negative with  the exception of those mentioned in the HPI and as above.  Objective: Vital signs in last 24 hours: Temp:  [97.5 F (36.4 C)] 97.5 F (36.4 C) (10/19 1275) Pulse Rate:  [73] 73 (10/19 0611) Resp:  [18] 18 (10/19 0611) BP: (155)/(70) 155/70 (10/19 0611) SpO2:  [99 %] 99 % (10/19 0611) Weight:  [69.1 kg (152 lb 6.4 oz)] 69.1 kg (152 lb 6.4 oz) (10/19 1700)  General Appearance: Alert, cooperative, no distress, appears stated age Head: Normocephalic, without obvious abnormality, atraumatic Eyes: PERRL, conjunctiva/corneas clear, EOM's intact    Neck: Supple, symmetrical, trachea midline Back: Symmetric, no curvature, ROM normal, no CVA tenderness Lungs:  respirations unlabored Heart: Regular rate and rhythm Abdomen: Soft, non-tender Extremities: Extremities normal, atraumatic, no cyanosis or edema Pulses: 2+ and symmetric all extremities Skin: Skin color, texture, turgor normal, no rashes or lesions  NEUROLOGIC:   Mental status: Alert and oriented x4,  no aphasia, good attention span, fund of knowledge, and memory Motor Exam - grossly normal Sensory Exam - grossly normal Reflexes: trace Coordination - grossly normal Gait - not tested Balance - not tested Cranial Nerves: I: smell Not tested  II: visual acuity  OS: nl    OD: nl  II: visual fields Full to confrontation  II: pupils Equal, round, reactive to light  III,VII: ptosis None  III,IV,VI:  extraocular muscles  Full ROM  V: mastication Normal  V: facial light touch sensation  Normal  V,VII: corneal reflex  Present  VII: facial muscle function - upper  Normal  VII: facial muscle function - lower Normal  VIII: hearing Not tested  IX: soft palate elevation  Normal  IX,X: gag reflex Present  XI: trapezius strength  5/5  XI: sternocleidomastoid strength 5/5  XI: neck flexion strength  5/5  XII: tongue strength  Normal    Data Review Lab Results  Component Value Date   WBC 6.1 02/01/2017   HGB 12.8 (L) 02/01/2017   HCT 39.2 02/01/2017   MCV 94.2 02/01/2017   PLT 255 02/01/2017   Lab Results  Component Value Date   NA 137 02/01/2017   K 3.6 02/01/2017   CL 104 02/01/2017   CO2 24 02/01/2017   BUN 19 02/01/2017   CREATININE 0.77 02/01/2017   GLUCOSE 112 (H) 02/01/2017   Lab Results  Component Value Date   INR 1.10 02/01/2017    Assessment/Plan: Patient admitted for [facility] for stenosis. Patient has failed a reasonable attempt at conservative therapy.  I explained the condition and procedure to the patient and answered any questions.  Patient wishes to proceed with procedure as planned. Understands risks/ benefits and typical outcomes of procedure.   Kaedin Hicklin S 02/05/2017 7:18 AM

## 2017-02-05 NOTE — Anesthesia Postprocedure Evaluation (Signed)
Anesthesia Post Note  Patient: [name]  Procedure(s) Performed: Laminectomy and Foraminotomy - Lumbar One-Two,Lumbar Two-Three, Lumbar Three-Four. (N/A Spine Lumbar)     Patient location during evaluation: PACU Anesthesia Type: General Level of consciousness: awake Pain management: pain level controlled Vital Signs Assessment: post-procedure vital signs reviewed and stable Respiratory status: spontaneous breathing Cardiovascular status: stable Anesthetic complications: no    Last Vitals:  Vitals:   02/05/17 0957 02/05/17 1015  BP: (!) 154/74 (!) 163/95  Pulse: 80 94  Resp: 13 13  Temp: 36.6 C   SpO2: 100% 99%    Last Pain:  Vitals:   02/05/17 1015  TempSrc:   PainSc: 5                  Dallin Mccorkel

## 2017-02-05 NOTE — Evaluation (Signed)
Physical Therapy Evaluation Patient Details Name: [name] MRN: [id number] DOB: 31-May-1933 Today's Date: 02/05/2017   History of Present Illness  Pt is an 83 y/o male s/p L1-4 laminectomy. PMH includes parkinson's disease, HTN, anxiety, bladder cancer, depression, and ORIF of R arm.   Clinical Impression  Patient is s/p above surgery resulting in the deficits listed below (see PT Problem List). PTA, pt's wife reports pt required assist with ambulation with use of cane and required assist for ADLs. Also reports they have a caregiver who comes while pt's wife is at work. Upon eval, pt presenting with pain, weakness, and decreased balance. Pt with Parkinson's disease at baseline, and demonstrated shuffling gait as well. Required min guard to mod A for mobility with use of RW. Reports wife and aide will be able to assist at d/c. Educated about use of RW at home to increase stability with mobility. Patient will benefit from skilled PT to increase their independence and safety with mobility (while adhering to their precautions) to allow discharge to the venue listed below. Will continue to follow acutely.      Follow Up Recommendations Home health PT;Supervision/Assistance - 24 hour    Equipment Recommendations  Rolling walker with 5" wheels    Recommendations for Other Services OT consult     Precautions / Restrictions Precautions Precautions: Back Precaution Booklet Issued: Yes (comment) Precaution Comments: Reviewed back precautions with pt and family.  Restrictions Weight Bearing Restrictions: No      Mobility  Bed Mobility Overal bed mobility: Needs Assistance Bed Mobility: Sidelying to Sit;Rolling;Sit to Sidelying Rolling: Mod assist Sidelying to sit: Mod assist     Sit to sidelying: Mod assist General bed mobility comments: Mod A for bed mobility and to ensure log roll technique.   Transfers Overall transfer level: Needs assistance Equipment used: Rolling walker (2  wheeled) Transfers: Sit to/from Stand Sit to Stand: Mod assist         General transfer comment: Mod A for lift assist and steadying upon standing. Verbal cues for hand placement.   Ambulation/Gait Ambulation/Gait assistance: Min guard Ambulation Distance (Feet): 75 Feet Assistive device: Rolling walker (2 wheeled) Gait Pattern/deviations: Step-through pattern;Decreased stride length;Trunk flexed;Shuffle Gait velocity: Decreased Gait velocity interpretation: Below normal speed for age/gender General Gait Details: Slow, guarded gait. Shuffle gait secondary to parkinson's disease. min guard assist for steadying assist. Verbal cues for upright posture and proximity to device.   Stairs            Wheelchair Mobility    Modified Rankin (Stroke Patients Only)       Balance Overall balance assessment: Needs assistance Sitting-balance support: Feet supported;No upper extremity supported Sitting balance-Leahy Scale: Fair     Standing balance support: Bilateral upper extremity supported;During functional activity Standing balance-Leahy Scale: Poor Standing balance comment: Reliant on UE support for balance                              Pertinent Vitals/Pain Pain Assessment: Faces Faces Pain Scale: Hurts even more Pain Location: back  Pain Descriptors / Indicators: Aching;Operative site guarding;Grimacing Pain Intervention(s): Limited activity within patient's tolerance;Monitored during session;Repositioned    Home Living Family/patient expects to be discharged to:: Private residence Living Arrangements: Spouse/significant other Available Help at Discharge: Family;Personal care attendant;Available 24 hours/day Type of Home: House Home Access: Stairs to enter Entrance Stairs-Rails: Left Entrance Stairs-Number of Steps: 3 Home Layout: Two level Home Equipment: Cane -  single point;Bedside commode;Shower seat      Prior Function Level of Independence:  Independent with assistive device(s);Needs assistance   Gait / Transfers Assistance Needed: [name] wife reports she helped with transfers and walking with cane.   ADL's / Homemaking Assistance Needed: [name] wife helped with bathing/dressing and transfers into shower. Has aide as well.         Hand Dominance   Dominant Hand: Left    Extremity/Trunk Assessment   Upper Extremity Assessment Upper Extremity Assessment: Defer to OT evaluation    Lower Extremity Assessment Lower Extremity Assessment: Generalized weakness    Cervical / Trunk Assessment Cervical / Trunk Assessment: Kyphotic;Other exceptions Cervical / Trunk Exceptions: s/p laminectomy   Communication   Communication: HOH  Cognition Arousal/Alertness: Awake/alert Behavior During Therapy: Flat affect Overall Cognitive Status: History of cognitive impairments - at baseline                                        General Comments General comments (skin integrity, edema, etc.): Educated about generalized exericse program.     Exercises     Assessment/Plan    PT Assessment Patient needs continued PT services  PT Problem List Decreased strength;Decreased balance;Decreased mobility;Decreased coordination;Decreased cognition;Decreased knowledge of use of DME;Decreased safety awareness;Decreased knowledge of precautions;Pain       PT Treatment Interventions DME instruction;Stair training;Gait training;Functional mobility training;Therapeutic activities;Balance training;Therapeutic exercise;Neuromuscular re-education;Patient/family education    PT Goals (Current goals can be found in the Care Plan section)  Acute Rehab PT Goals Patient Stated Goal: none stated  PT Goal Formulation: With patient Time For Goal Achievement: 02/12/17 Potential to Achieve Goals: Good    Frequency Min 5X/week   Barriers to discharge        Co-evaluation               AM-PAC PT "6 Clicks" Daily Activity   Outcome Measure Difficulty turning over in bed (including adjusting bedclothes, sheets and blankets)?: Unable Difficulty moving from lying on back to sitting on the side of the bed? : Unable Difficulty sitting down on and standing up from a chair with arms (e.g., wheelchair, bedside commode, etc,.)?: Unable Help needed moving to and from a bed to chair (including a wheelchair)?: A Little Help needed walking in hospital room?: A Little Help needed climbing 3-5 steps with a railing? : A Lot 6 Click Score: 11    End of Session Equipment Utilized During Treatment: Gait belt Activity Tolerance: Patient tolerated treatment well Patient left: in bed;with call bell/phone within reach;with family/visitor present Nurse Communication: Mobility status PT Visit Diagnosis: Other abnormalities of gait and mobility (R26.89);Unsteadiness on feet (R26.81);Other symptoms and signs involving the nervous system (R29.898);Pain Pain - part of body:  (back)    Time: 5009-3818 PT Time Calculation (min) (ACUTE ONLY): 44 min   Charges:   PT Evaluation $PT Eval Moderate Complexity: 1 Mod PT Treatments $Gait Training: 23-37 mins   PT G Codes:   PT G-Codes **NOT FOR INPATIENT CLASS** Functional Assessment Tool Used: AM-PAC 6 Clicks Basic Mobility;Clinical judgement Functional Limitation: Mobility: Walking and moving around Mobility: Walking and Moving Around Current Status (E9937): At least 60 percent but less than 80 percent impaired, limited or restricted Mobility: Walking and Moving Around Goal Status [phone]): At least 40 percent but less than 60 percent impaired, limited or restricted    Leighton Ruff, PT, DPT  Acute Rehabilitation  Services  Pager: [phone]   Rudean Hitt 02/05/2017, 5:25 PM

## 2017-02-05 NOTE — Transfer of Care (Signed)
Immediate Anesthesia Transfer of Care Note  Patient: [name]  Procedure(s) Performed: Laminectomy and Foraminotomy - Lumbar One-Two,Lumbar Two-Three, Lumbar Three-Four. (N/A Spine Lumbar)  Patient Location: PACU  Anesthesia Type:General  Level of Consciousness: alert , oriented, drowsy and patient cooperative  Airway & Oxygen Therapy: Patient Spontanous Breathing and Patient connected to nasal cannula oxygen  Post-op Assessment: Report given to RN and Post -op Vital signs reviewed and stable  Post vital signs: Reviewed and stable  Last Vitals:  Vitals:   02/05/17 0611 02/05/17 0957  BP: (!) 155/70 (!) 154/74  Pulse: 73 80  Resp: 18 13  Temp: (!) 36.4 C 36.6 C  SpO2: 99% 100%    Last Pain:  Vitals:   02/05/17 0611  TempSrc: Oral  PainSc: 10-Worst pain ever      Patients Stated Pain Goal: 6 (39/68/86 4847)  Complications: No apparent anesthesia complications

## 2017-02-06 ENCOUNTER — Encounter (HOSPITAL_COMMUNITY): Payer: Self-pay | Admitting: Neurological Surgery

## 2017-02-06 DIAGNOSIS — M48061 Spinal stenosis, lumbar region without neurogenic claudication: Secondary | ICD-10-CM | POA: Diagnosis not present

## 2017-02-06 DIAGNOSIS — G4733 Obstructive sleep apnea (adult) (pediatric): Secondary | ICD-10-CM | POA: Diagnosis not present

## 2017-02-06 DIAGNOSIS — Z79899 Other long term (current) drug therapy: Secondary | ICD-10-CM | POA: Diagnosis not present

## 2017-02-06 DIAGNOSIS — M1990 Unspecified osteoarthritis, unspecified site: Secondary | ICD-10-CM | POA: Diagnosis not present

## 2017-02-06 DIAGNOSIS — I10 Essential (primary) hypertension: Secondary | ICD-10-CM | POA: Diagnosis not present

## 2017-02-06 DIAGNOSIS — G20 Parkinson's disease: Secondary | ICD-10-CM | POA: Diagnosis not present

## 2017-02-06 DIAGNOSIS — Z87891 Personal history of nicotine dependence: Secondary | ICD-10-CM | POA: Diagnosis not present

## 2017-02-06 MED ORDER — HYDROCODONE-ACETAMINOPHEN 7.5-325 MG PO TABS: 0.5000 | ORAL_TABLET | ORAL | 0 refills | Status: DC | PRN

## 2017-02-06 MED ORDER — MORPHINE SULFATE 15 MG PO TABS: 15.0000 mg | ORAL_TABLET | Freq: Three times a day (TID) | ORAL | 0 refills | Status: DC | PRN

## 2017-02-06 NOTE — Discharge Summary (Signed)
Physician Discharge Summary  Patient ID: [name] MRN: [id number] DOB/AGE: 09-20-1933 83 y.o.  Admit date: 02/05/2017 Discharge date: 02/06/2017  Admission Diagnoses:Lumbar spinal stenosis, Parkinson's disease  Discharge Diagnoses: Same Active Problems:   S/P lumbar laminectomy   Discharged Condition: fair  Hospital Course: Patient underwent decompressive lumbar laminectomy for stenosis, from which he did well.  He was a bit confused on POD 1, but on discussion with family, we felt he would do better at home than in hospital.  Doing well with regard to surgery.  Consults: None  Significant Diagnostic Studies: None  Treatments: surgery: Patient underwent decompressive lumbar laminectomy for stenosis  Discharge Exam: Blood pressure (!) 156/78, pulse 93, temperature 98.3 F (36.8 C), temperature source Oral, resp. rate 18, height 5\' 10"  (1.778 m), weight 152 lb 6.4 oz (69.1 kg), SpO2 97 %. Neurologic: Alert and oriented X 3, normal strength and tone. Normal symmetric reflexes. Normal coordination and gait Wound:CDI  Disposition: Home  Discharge Instructions    Diet - low sodium heart healthy    Complete by:  As directed    Increase activity slowly    Complete by:  As directed      Allergies as of 02/06/2017   No Known Allergies     Medication List    TAKE these medications   acetaminophen 500 MG tablet Commonly known as:  TYLENOL Take 500-1,000 mg by mouth every 6 (six) hours. 1000 mg in the morning and at bedtime  and 500 mg at noon, and  1600   atorvastatin 40 MG tablet Commonly known as:  LIPITOR Take 1 tablet (40 mg total) by mouth daily. What changed:  when to take this  additional instructions   carbidopa-levodopa 50-200 MG tablet Commonly known as:  SINEMET CR TAKE 1 TABLET BY MOUTH AT BEDTIME.   carbidopa-levodopa 25-100 MG tablet Commonly known as:  SINEMET IR TAKE TWO TABLETS BY MOUTH FOUR TIMES DAILY   docusate sodium 100 MG  capsule Commonly known as:  COLACE Take 100-200 mg by mouth 2 (two) times daily as needed for mild constipation (depending on constipation).   fexofenadine 180 MG tablet Commonly known as:  ALLEGRA Take 180 mg by mouth daily.   FLUoxetine 10 MG capsule Commonly known as:  PROZAC Take 3 capsules (30 mg total) by mouth daily.   fluticasone 50 MCG/ACT nasal spray Commonly known as:  FLONASE Place 2 sprays into both nostrils daily. What changed:  when to take this  reasons to take this   HYDROcodone-acetaminophen 5-325 MG tablet Commonly known as:  NORCO/VICODIN Take 1-2 tablets by mouth 3 (three) times daily. What changed:  Another medication with the same name was added. Make sure you understand how and when to take each.   HYDROcodone-acetaminophen 7.5-325 MG tablet Commonly known as:  NORCO Take 0.5-1 tablets by mouth every 4 (four) hours as needed for moderate pain. What changed:  You were already taking a medication with the same name, and this prescription was added. Make sure you understand how and when to take each.   ketorolac 10 MG tablet Commonly known as:  TORADOL Take 1 tablet (10 mg total) by mouth every 6 (six) hours as needed for moderate pain.   Lidocaine 0.5 % Gel Apply 1 application topically 3 (three) times daily as needed. What changed:  reasons to take this   losartan 100 MG tablet Commonly known as:  COZAAR Take 1 tablet (100 mg total) by mouth daily. What changed:  when to  take this   morphine 15 MG tablet Commonly known as:  MSIR Take 15 mg by mouth 3 (three) times daily as needed for severe pain. What changed:  Another medication with the same name was added. Make sure you understand how and when to take each.   morphine 15 MG tablet Commonly known as:  MSIR Take 1 tablet (15 mg total) by mouth 3 (three) times daily as needed for severe pain. What changed:  You were already taking a medication with the same name, and this prescription was  added. Make sure you understand how and when to take each.   MYRBETRIQ 25 MG Tb24 tablet Generic drug:  mirabegron ER Take 25 mg by mouth daily. At [place]:  Urea 40%, apply daily to affected areas.   polyethylene glycol packet Commonly known as:  MIRALAX / GLYCOLAX Take 17 g by mouth daily as needed for mild constipation. For laxative   predniSONE 10 MG tablet Commonly known as:  DELTASONE Take 6 tablets (60 mg total) by mouth daily. What changed:  additional instructions   PROBIOTIC DAILY PO Take 1 capsule by mouth daily.   ROLAIDS PO Take 2 tablets by mouth as needed (indigestion).   tamsulosin 0.4 MG Caps capsule Commonly known as:  FLOMAX Take 0.4 mg by mouth daily after supper.        Signed: Peggyann Shoals, MD 02/06/2017, 6:58 AM

## 2017-02-06 NOTE — Care Management Note (Signed)
Case Management Note  Patient Details  Name: [name] MRN: [id number] Date of Birth: 1933-08-21  Subjective/Objective:  83 y.o. To be discharged with HHPT and DME. Wife has chosen [facility] to provide this care and CM alerted Jermaine , the rep for this agency.                   Action/Plan:CM will sign off for now but will be available should additional discharge needs arise or disposition change.    Expected Discharge Date:  02/06/17               Expected Discharge Plan:  [place]  In-House Referral:  NA  Discharge planning Services  CM Consult  Post Acute Care Choice:  Home Health, Durable Medical Equipment Choice offered to:  Spouse  DME Arranged:  [facility] rolling DME Agency:  [place] Arranged:  PT [facility] Agency:  [facility]  Status of Service:  Completed, signed off  If discussed at [place] of Stay Meetings, dates discussed:    Additional Comments:  Delrae Sawyers, RN 02/06/2017, 8:59 AM

## 2017-02-06 NOTE — Discharge Instructions (Signed)
Wound Care Keep incision covered and dry for 3 days.  If you shower, cover incision with plastic wrap.  Do not put any creams, lotions, or ointments on incision. Leave steri-strips on back.  They will fall off by themselves. Activity Walk each and every day, increasing distance each day. No lifting greater than 5 lbs.  Avoid excessive back motion.  Diet Resume your normal diet.   Call Your Doctor If Any of These Occur Redness, drainage, or swelling at the wound.  Temperature greater than 101 degrees. Severe pain not relieved by pain medication. Incision starts to come apart. Follow Up Appt Call today for appointment in 1-2 weeks ([phone]) or for problems.  If you have any hardware placed in your spine, you will need an x-ray before your appointment.

## 2017-02-06 NOTE — Progress Notes (Signed)
Patient alert and oriented with occasional confusion, mae's well, voiding adequate amount of urine, swallowing without difficulty, no c/o pain at time of discharge. With spouse and daughter present. Patient discharged home with family. Script and discharged instructions given to spouse. spouse and family stated understanding of instructions given. Patient has an appointment with Dr. Ronnald Ramp

## 2017-02-06 NOTE — Evaluation (Signed)
Occupational Therapy Evaluation Patient Details Name: [name] MRN: [id number] DOB: 1934-02-23 Today's Date: 02/06/2017    History of Present Illness Pt is an 83 y/o male s/p L1-4 laminectomy. PMH includes parkinson's disease, HTN, anxiety, bladder cancer, depression, and ORIF of R arm.    Clinical Impression   PTA, pt was living with his wife who assisted with ADLs and IADLs. Pt also with aide who assists with ADLs and IADLs while wife is at work. Pt requiring Max A for LB ADLs to maintain back precautions due to cognitive deficits. Provided education to wife on LB dressing, bathing, and toileting to increase adherence to precautions while at home. Answered family's questions in preparation for dc later today. Recommend dc home with 24 hour assistance and HHOT to optimize safety and participation with ADLs. All acute OT needs met and will sign off.     Follow Up Recommendations  Home health OT;Supervision/Assistance - 24 hour    Equipment Recommendations  None recommended by OT    Recommendations for Other Services PT consult     Precautions / Restrictions Precautions Precautions: Back Precaution Booklet Issued: Yes (comment) Precaution Comments: Reviewed back precautions with pt and family.  Restrictions Weight Bearing Restrictions: No      Mobility Bed Mobility               General bed mobility comments: Pt on EOB upon arrival  Transfers Overall transfer level: Needs assistance Equipment used: Rolling walker (2 wheeled) Transfers: Sit to/from Stand Sit to Stand: Min assist         General transfer comment: Min A to stabilize RW while pt pulled into standing. Verbal cues for hand placement. Educated family on safety and tehcnique    Balance Overall balance assessment: Needs assistance Sitting-balance support: Feet supported;No upper extremity supported Sitting balance-Leahy Scale: Fair     Standing balance support: Bilateral upper extremity  supported;During functional activity Standing balance-Leahy Scale: Poor Standing balance comment: Reliant on UE support for balance                            ADL either performed or assessed with clinical judgement   ADL Overall ADL's : Needs assistance/impaired                                       General ADL Comments: Pt will require Max A for LB ADLs due to cognititve deficits and back precautions. Educated wife on back precautions and LB dressing, bathing, and toileting techniques. Wife verbalizing understanding and need for pt to have Max A. Educated pt on benefits of reacher and long handled sponge, but also that pt may not use due to dementia and current set habits.      Vision Baseline Vision/History: Wears glasses Wears Glasses: At all times Patient Visual Report: No change from baseline       Perception     Praxis      Pertinent Vitals/Pain Pain Assessment: Faces Faces Pain Scale: Hurts little more Pain Location: back  Pain Descriptors / Indicators: Aching;Operative site guarding;Grimacing Pain Intervention(s): Monitored during session;Repositioned;Limited activity within patient's tolerance     Hand Dominance Left   Extremity/Trunk Assessment Upper Extremity Assessment Upper Extremity Assessment: Generalized weakness   Lower Extremity Assessment Lower Extremity Assessment: Defer to PT evaluation   Cervical / Trunk Assessment Cervical / Trunk Assessment:  Kyphotic;Other exceptions Cervical / Trunk Exceptions: s/p laminectomy    Communication Communication Communication: HOH   Cognition Arousal/Alertness: Awake/alert Behavior During Therapy: Flat affect Overall Cognitive Status: History of cognitive impairments - at baseline                                 General Comments: Wife reports that pt is not behaving as typical for him. Pt more resistant than normal.    General Comments  Wife and daughter present  throughotu session    Exercises     Shoulder Instructions      Home Living Family/patient expects to be discharged to:: Private residence Living Arrangements: Spouse/significant other Available Help at Discharge: Family;Personal care attendant;Available 24 hours/day Type of Home: House Home Access: Stairs to enter [employer] of Steps: 3 Entrance Stairs-Rails: Left Home Layout: Two level Alternate Level Stairs-Number of Steps: 15 Alternate Level Stairs-Rails: Right Bathroom Shower/Tub: Occupational psychologist: Handicapped height     Home Equipment: Cane - single point;Bedside commode;Shower seat;Grab bars - toilet;Grab bars - tub/shower   Additional Comments: RW in room      Prior Functioning/Environment Level of Independence: Independent with assistive device(s);Needs assistance  Gait / Transfers Assistance Needed: [name] wife reports she helped with transfers and walking with cane.  ADL's / Homemaking Assistance Needed: [name] wife helped with bathing/dressing and transfers into shower. Has aide as well.             OT Problem List: Decreased strength;Decreased activity tolerance;Impaired balance (sitting and/or standing);Decreased cognition;Decreased safety awareness;Decreased knowledge of use of DME or AE;Decreased knowledge of precautions;Pain      OT Treatment/Interventions:      OT Goals(Current goals can be found in the care plan section) Acute Rehab OT Goals Patient Stated Goal: none stated  OT Goal Formulation: With patient/family Time For Goal Achievement: 02/20/17 Potential to Achieve Goals: Good  OT Frequency:     Barriers to D/C:            Co-evaluation              AM-PAC PT "6 Clicks" Daily Activity     Outcome Measure Help from another person eating meals?: None Help from another person taking care of personal grooming?: A Little Help from another person toileting, which includes using toliet, bedpan, or urinal?: A  Little Help from another person bathing (including washing, rinsing, drying)?: A Lot Help from another person to put on and taking off regular upper body clothing?: A Little Help from another person to put on and taking off regular lower body clothing?: A Lot 6 Click Score: 17   End of Session Equipment Utilized During Treatment: Rolling walker Nurse Communication: Mobility status  Activity Tolerance: Treatment limited secondary to agitation Patient left: in bed;with call bell/phone within reach;with nursing/sitter in room;with family/visitor present  OT Visit Diagnosis: Unsteadiness on feet (R26.81);Pain;Other symptoms and signs involving cognitive function Pain - Right/Left:  (Back) Pain - part of body:  (Back)                Time: 7846-9629 OT Time Calculation (min): 23 min Charges:  OT General Charges $OT Visit: 1 Visit OT Evaluation $OT Eval Moderate Complexity: 1 Mod OT Treatments $Self Care/Home Management : 8-22 mins G-Codes: OT G-codes **NOT FOR INPATIENT CLASS** Functional Assessment Tool Used: Clinical judgement Functional Limitation: Self care Self Care Current Status (B2841): At least 60 percent  but less than 80 percent impaired, limited or restricted Self Care Goal Status (T2458): At least 20 percent but less than 40 percent impaired, limited or restricted Self Care Discharge Status [id number]): At least 60 percent but less than 80 percent impaired, limited or restricted   [place], OTR/L Acute Rehab Pager: [phone] Office: [place] 02/06/2017, 9:20 AM

## 2017-02-06 NOTE — Progress Notes (Signed)
Physical Therapy Treatment Patient Details Name: [name] MRN: [id number] DOB: [birth year] Today's Date: 02/06/2017    History of Present Illness Pt is an 83 y/o male s/p L1-4 laminectomy. PMH includes parkinson's disease, HTN, anxiety, bladder cancer, depression, and ORIF of R arm.     PT Comments    Patient is making limited progress toward mobility goals. Session was limited today secondary to agitation and resistance to comply with treatment goals. Patient's daughter present throughout session and reported patient has h/o of cognitive decline. Recommendations below remain appropriate to address impairments and maximize functional mobility. PT will continue to follow acutely and progress as tolerated.  Follow Up Recommendations  Home health PT;Supervision/Assistance - 24 hour     Equipment Recommendations  Rolling walker with 5" wheels    Recommendations for Other Services OT consult     Precautions / Restrictions Precautions Precautions: Back Precaution Booklet Issued: Yes (comment) Precaution Comments: Reviewed back precautions with pt and family.  Restrictions Weight Bearing Restrictions: No    Mobility  Bed Mobility               General bed mobility comments: Pt standing in room with daughter upon arrival.  Transfers Overall transfer level: Needs assistance Equipment used: Rolling walker (2 wheeled) Transfers: Sit to/from Stand Sit to Stand: Supervision         General transfer comment: Pt was standing upon arrival; provided supervision for safety and min VCs for reaching back with UEs before sitting.  Ambulation/Gait Ambulation/Gait assistance: Supervision;Min guard Ambulation Distance (Feet): 500 Feet Assistive device: Rolling walker (2 wheeled) Gait Pattern/deviations: Step-through pattern;Decreased stride length;Trunk flexed;Shuffle Gait velocity: Decreased Gait velocity interpretation: Below normal speed for age/gender General Gait Details:  Slow with shuffling gait secondary to Parkinson's Disease. Supervision overall with VCs for watching where he was walking and to divert focus from closing RW. Min guard assis for steadying with 1 LOB.    Stairs Stairs:  (attempted but patient resistant to complete)          Wheelchair Mobility    Modified Rankin (Stroke Patients Only)       Balance Overall balance assessment: Needs assistance Sitting-balance support: Feet supported;No upper extremity supported Sitting balance-Leahy Scale: Fair     Standing balance support: Bilateral upper extremity supported;During functional activity Standing balance-Leahy Scale: Poor Standing balance comment: Reliant on UE support for balance                             Cognition Arousal/Alertness: Awake/alert Behavior During Therapy: Agitated Overall Cognitive Status: History of cognitive impairments - at baseline                                 General Comments: Patient resistant to treatment and agitated throughout.       Exercises      General Comments General comments (skin integrity, edema, etc.): Daughter present throughout session.      Pertinent Vitals/Pain Pain Assessment: Faces Faces Pain Scale: Hurts little more Pain Location: back  Pain Descriptors / Indicators: Aching;Operative site guarding;Grimacing Pain Intervention(s): Monitored during session;Repositioned;Limited activity within patient's tolerance    Home Living Family/patient expects to be discharged to:: Private residence Living Arrangements: Spouse/significant other Available Help at Discharge: Family;Personal care attendant;Available 24 hours/day Type of Home: House Home Access: Stairs to enter Entrance Stairs-Rails: Left Home Layout: Two level Home Equipment: [name] -  single point;Bedside commode;Shower seat;Grab bars - toilet;Grab bars - tub/shower Additional Comments: RW in room    Prior Function Level of Independence:  Independent with assistive device(s);Needs assistance  Gait / Transfers Assistance Needed: [name] wife reports she helped with transfers and walking with cane.  ADL's / Homemaking Assistance Needed: [name] wife helped with bathing/dressing and transfers into shower. Has aide as well.      PT Goals (current goals can now be found in the care plan section) Acute Rehab PT Goals Patient Stated Goal: none stated  PT Goal Formulation: With patient Time For Goal Achievement: 02/12/17 Potential to Achieve Goals: Good Progress towards PT goals: Progressing toward goals    Frequency    Min 5X/week      PT Plan Current plan remains appropriate    Co-evaluation              AM-PAC PT "6 Clicks" Daily Activity  Outcome Measure  Difficulty turning over in bed (including adjusting bedclothes, sheets and blankets)?: Unable Difficulty moving from lying on back to sitting on the side of the bed? : Unable Difficulty sitting down on and standing up from a chair with arms (e.g., wheelchair, bedside commode, etc,.)?: A Lot Help needed moving to and from a bed to chair (including a wheelchair)?: A Little Help needed walking in hospital room?: A Little Help needed climbing 3-5 steps with a railing? : A Lot 6 Click Score: 12    End of Session Equipment Utilized During Treatment: Gait belt Activity Tolerance: Treatment limited secondary to agitation Patient left: in bed;with call bell/phone within reach;with family/visitor present Nurse Communication: Mobility status PT Visit Diagnosis: Other abnormalities of gait and mobility (R26.89);Unsteadiness on feet (R26.81);Other symptoms and signs involving the nervous system (R29.898);Pain Pain - part of body:  (back)     Time: 9381-0175 PT Time Calculation (min) (ACUTE ONLY): 25 min  Charges:  $Gait Training: 8-22 mins                    G CodesSindy Guadeloupe, SPT [phone] office    Margarita Grizzle 02/06/2017, 9:37  AM

## 2017-02-06 NOTE — Progress Notes (Signed)
Subjective: Patient reports a bit confused  Objective: Vital signs in last 24 hours: Temp:  [97.6 F (36.4 C)-98.3 F (36.8 C)] 98.3 F (36.8 C) (10/20 0618) Pulse Rate:  [80-101] 93 (10/20 0618) Resp:  [12-18] 18 (10/20 0618) BP: (128-163)/(66-95) 156/78 (10/20 0618) SpO2:  [97 %-100 %] 97 % (10/20 0618)  Intake/Output from previous day: 10/19 0730 - 10/20 0729 In: 1183 [P.O.:480; I.V.:703] Out: 50 [Blood:50] Intake/Output this shift: Total I/O In: 1183 [P.O.:480; I.V.:703] Out: 50 [Blood:50]  Physical Exam: Up and walking.  Wants to go home.  A bit confused.  Some drainage on dressing.  Lab Results: No results for input(s): WBC, HGB, HCT, PLT in the last 72 hours. BMET No results for input(s): NA, K, CL, CO2, GLUCOSE, BUN, CREATININE, CALCIUM in the last 72 hours.  Studies/Results: Dg Lumbar Spine 2-3 Views  Result Date: 02/05/2017 CLINICAL DATA:  Multilevel laminectomy EXAM: LUMBAR SPINE - 2-3 VIEW COMPARISON:  Lumbar MRI January 01, 2017 FINDINGS: Cross-table lateral lumbar image time stamped 8:14:10 submitted. Metallic probe tip is posterior to the L3-4 interspace level. There is 6 mm of anterolisthesis of L4 on L5. There is marked disc space narrowing at L1-2 and L2-3. Cross-table lateral lumbar image time stamped 8:20:53 submitted. Metallic probe tip is posterior to the superior aspect of the L3 vertebral body. Spondylolisthesis at L4-5 is stable. Marked disc space narrowing at L1-2 and L2-3 is stable. There is aortic atherosclerosis. IMPRESSION: On the final submitted image, metallic probe tip is posterior to the superior most aspect of the L3 vertebral body. There is spondylolisthesis at L4-5. There is marked disc space narrowing at L1-2 and L2-3. There is aortic atherosclerosis. Aortic Atherosclerosis (ICD10-I70.0). Electronically Signed   By: Lowella Grip III M.D.   On: 02/05/2017 08:49    Assessment/Plan: Probably patient will do better at home.  He is  confused and disrupted from normal routine in hospital.  I have discussed situation with daughter.  Use pain medication minimally and get patient back on normal medication routine.    LOS: 1 day    Peggyann Shoals, MD 02/06/2017, 6:56 AM

## 2017-02-08 ENCOUNTER — Telehealth: Payer: Self-pay | Admitting: Neurology

## 2017-02-08 NOTE — Telephone Encounter (Signed)
Patient's wife called back and states patient is much better. Will call with any further issues.

## 2017-02-08 NOTE — Telephone Encounter (Signed)
Received note from after hours nursing that patient's wife [name] called on 02/06/17 at 5:05 pm.  It states, "Caller states her husband had back surgery yesterday for spinal stenosis. He has dementia and parkinsons. He thinks that her and her daughter are trying to kill him. He has taken some swipes at her with his cane. He is very combative and won't listen."   It looks like he was instructed to call 911 from nurses, but it doesn't look like he went to [employer].   I called to check on patient. Had to leave message with nursing assistant to have wife call me back.

## 2017-02-08 NOTE — Telephone Encounter (Signed)
Dr. Tat - FYI. 

## 2017-02-09 ENCOUNTER — Telehealth: Payer: Self-pay | Admitting: Internal Medicine

## 2017-02-09 DIAGNOSIS — G4733 Obstructive sleep apnea (adult) (pediatric): Secondary | ICD-10-CM | POA: Diagnosis not present

## 2017-02-09 DIAGNOSIS — F329 Major depressive disorder, single episode, unspecified: Secondary | ICD-10-CM | POA: Diagnosis not present

## 2017-02-09 DIAGNOSIS — F419 Anxiety disorder, unspecified: Secondary | ICD-10-CM | POA: Diagnosis not present

## 2017-02-09 DIAGNOSIS — I10 Essential (primary) hypertension: Secondary | ICD-10-CM | POA: Diagnosis not present

## 2017-02-09 DIAGNOSIS — G20 Parkinson's disease: Secondary | ICD-10-CM | POA: Diagnosis not present

## 2017-02-09 DIAGNOSIS — Z4789 Encounter for other orthopedic aftercare: Secondary | ICD-10-CM | POA: Diagnosis not present

## 2017-02-09 DIAGNOSIS — M48062 Spinal stenosis, lumbar region with neurogenic claudication: Secondary | ICD-10-CM | POA: Diagnosis not present

## 2017-02-09 DIAGNOSIS — K219 Gastro-esophageal reflux disease without esophagitis: Secondary | ICD-10-CM | POA: Diagnosis not present

## 2017-02-09 DIAGNOSIS — E785 Hyperlipidemia, unspecified: Secondary | ICD-10-CM | POA: Diagnosis not present

## 2017-02-09 DIAGNOSIS — M5136 Other intervertebral disc degeneration, lumbar region: Secondary | ICD-10-CM | POA: Diagnosis not present

## 2017-02-09 NOTE — Telephone Encounter (Signed)
Spoke w/ [id number] (sp?), verbal orders given.

## 2017-02-09 NOTE — Telephone Encounter (Signed)
That is okay.

## 2017-02-09 NOTE — Telephone Encounter (Signed)
[facility] nurse called in to request verbal order home health     Frequency: Twice a week for 3 weeks.    CB: [phone]

## 2017-02-09 NOTE — Telephone Encounter (Signed)
Okay to give orders? 

## 2017-02-14 ENCOUNTER — Other Ambulatory Visit: Payer: Self-pay | Admitting: Neurology

## 2017-02-19 ENCOUNTER — Telehealth: Payer: Self-pay | Admitting: *Deleted

## 2017-02-19 DIAGNOSIS — M48062 Spinal stenosis, lumbar region with neurogenic claudication: Secondary | ICD-10-CM | POA: Diagnosis not present

## 2017-02-19 NOTE — Telephone Encounter (Signed)
Received Home Health Certification and Plan of Care; forwarded to provider/SLS 11/02

## 2017-02-22 ENCOUNTER — Other Ambulatory Visit: Payer: Self-pay | Admitting: Internal Medicine

## 2017-02-23 NOTE — Telephone Encounter (Signed)
Plan of Care signed and faxed to [facility] at [phone]. Form sent for scanning.  

## 2017-03-03 ENCOUNTER — Ambulatory Visit: Payer: Medicare Other | Admitting: Internal Medicine

## 2017-03-03 ENCOUNTER — Encounter: Payer: Self-pay | Admitting: Internal Medicine

## 2017-03-03 VITALS — BP 128/78 | HR 68 | Temp 97.9°F | Resp 14 | Ht 70.0 in | Wt 152.4 lb

## 2017-03-03 DIAGNOSIS — I10 Essential (primary) hypertension: Secondary | ICD-10-CM

## 2017-03-03 DIAGNOSIS — K5900 Constipation, unspecified: Secondary | ICD-10-CM | POA: Diagnosis not present

## 2017-03-03 DIAGNOSIS — Z23 Encounter for immunization: Secondary | ICD-10-CM

## 2017-03-03 DIAGNOSIS — M150 Primary generalized (osteo)arthritis: Secondary | ICD-10-CM | POA: Diagnosis not present

## 2017-03-03 DIAGNOSIS — M159 Polyosteoarthritis, unspecified: Secondary | ICD-10-CM

## 2017-03-03 NOTE — Patient Instructions (Signed)
GO TO THE FRONT DESK Schedule your next appointment for a follow-up in 2-3 months  Continue Tylenol, if you can decrease the dose from 3000 mg daily to 2000 mg daily that is great.  Okay to take Advil sporadically  For constipation: Start OTC Colace, stool softener daily If you have constipation, okay to take MiraLAX daily for several days.  Please ask your pharmacist if the losartan you have has been recalled.

## 2017-03-03 NOTE — Progress Notes (Signed)
Pre visit review using our clinic review tool, if applicable. No additional management support is needed unless otherwise documented below in the visit note. 

## 2017-03-03 NOTE — Progress Notes (Signed)
Subjective:    Patient ID: [name], male    DOB: 1933/10/20, 83 y.o.   MRN: [id number]  DOS:  03/03/2017 Type of visit - description : f/u Interval history: Since the last visit, he had back surgery, pain has decreased.  His mobility has improved. Currently on Tylenol, never did need  narcotics.  Occasionally takes OTC NSAIDs.  Occasional constipation.  OTCs okay? Occasionally has discomfort at the right groin where he had hernia repair, no mass or bulging area.  He already discussed that with Dr. Ronnald Ramp, he wonders about this being related with recent surgery (pain at one the L dermatomes) per pt .  Emotionally doing okay, he is somewhat frustrated because the pain is not 100% gone.  Wt Readings from Last 3 Encounters:  03/03/17 152 lb 6 oz (69.1 kg)  02/05/17 152 lb 6.4 oz (69.1 kg)  02/01/17 152 lb 6.4 oz (69.1 kg)     Review of Systems No fever chills  Past Medical History:  Diagnosis Date  . Allergic rhinitis   . Anxiety   . Bladder cancer [facility])    urologist-  dr eskridge--  low grade TA  . BPH with elevated PSA   . DDD (degenerative disc disease), lumbar   . Depression   . Dry mouth    USES BIOTIN SPRAY/ MOUTHWASH  . Frequency of urination   . GERD (gastroesophageal reflux disease)   . Hearing loss    does not wear his hearing aid  . History of kidney stones    10/1989  . Hyperlipidemia   . Hypertension   . Mild obstructive sleep apnea    PER PT  MILD OSA , NO CPAP RX,  STUDY DONE 2009  . Nocturia   . OAB (overactive bladder)   . Osteoarthritis    KNEES, SHOULDER  . Parkinson disease ([place]) DX   2005   NEUROLOGIST-   DR TAT--  IDIOPATHIC PARKINSON'S /  TREMORS CONTROLLED WITH MEDS  . Urge urinary incontinence     Past Surgical History:  Procedure Laterality Date  . CATARACT EXTRACTION W/ INTRAOCULAR LENS  IMPLANT, BILATERAL  2014  . CYSTOSCOPY W/ RETROGRADES Bilateral 12/14/2014   Procedure: CYSTOSCOPY BLADDER BIOPSY FULGERATION MITOMYCIN C  BILATERAL RETROGRADE PYELOGRAM;  Surgeon: Festus Aloe, MD;  Location: [facility];  Service: Urology;  Laterality: Bilateral;  . CYSTOSCOPY WITH STENT PLACEMENT Left 12/19/2013   Procedure: CYSTOSCOPY BLADDER BX, LEFT URETERAL STENT PLACEMENT , LEFT RETROGRADE AND PYLOGRAM;  Surgeon: Festus Aloe, MD;  Location: [facility];  Service: Urology;  Laterality: Left;  . EYE SURGERY    . FRACTURE SURGERY    . HERNIA REPAIR    . INGUINAL HERNIA REPAIR  03/09/2012   Procedure: HERNIA REPAIR INGUINAL ADULT;  Surgeon: Adin Hector, MD;  Location: [facility];  Service: General;  Laterality: Right;  . INSERTION OF MESH  03/09/2012   Procedure: INSERTION OF MESH;  Surgeon: Adin Hector, MD;  Location: [facility];  Service: General;  Laterality: Right;  . LUMBAR LAMINECTOMY/DECOMPRESSION MICRODISCECTOMY N/A 02/05/2017   Procedure: Laminectomy and Foraminotomy - Lumbar One-Two,Lumbar Two-Three, Lumbar Three-Four.;  Surgeon: Eustace Moore, MD;  Location: [place];  Service: Neurosurgery;  Laterality: N/A;  . NASAL SINUS SURGERY  1982  . ORIF RIGHT ARM FX  1949   HARDWARE REMOVED   . REMOVAL BENIGN CYST LEFT FOREHEAD  1969  . TRANSURETHRAL RESECTION OF BLADDER TUMOR N/A 12/19/2013   Procedure:  TRANSURETHRAL  RESECTION OF BLADDER TUMOR WITH GYRUS (TURBT-GYRUS);  Surgeon: Festus Aloe, MD;  Location: [facility];  Service: Urology;  Laterality: N/A;    Social History   Socioeconomic History  . Marital status: Married    Spouse name: [name]  . Number of children: 1  . Years of education: Not on file  . Highest education level: Not on file  Social Needs  . Financial resource strain: Not on file  . Food insecurity - worry: Not on file  . Food insecurity - inability: Not on file  . Transportation needs - medical: Not on file  . Transportation needs - non-medical: Not on file  Occupational History  . Occupation: retired 1995, worked in [place] , finances    Tobacco Use  . Smoking status: Former Smoker    Packs/day: 1.00    Years: 15.00    Pack years: 15.00    Types: Cigarettes    Last attempt to quit: 04/20/1968    Years since quitting: 48.9  . Smokeless tobacco: Never Used  Substance and Sexual Activity  . Alcohol use: Yes    Alcohol/week: 4.2 oz    Types: 7 Glasses of wine per week    Comment: ONE WINE PER DAY  . Drug use: No  . Sexual activity: Not on file  Other Topics Concern  . Not on file  Social History Narrative   Lives w/ wife   Has a Daughter 31 y/o      Allergies as of 03/03/2017   No Known Allergies     Medication List        Accurate as of 03/03/17 11:59 PM. Always use your most recent med list.          acetaminophen 500 MG tablet Commonly known as:  TYLENOL Take 500-1,000 mg by mouth every 6 (six) hours. 1000 mg in the morning and at bedtime  and 500 mg at noon, and  1600   atorvastatin 40 MG tablet Commonly known as:  LIPITOR Take 1 tablet (40 mg total) by mouth daily.   carbidopa-levodopa 25-100 MG tablet Commonly known as:  SINEMET IR TAKE TWO TABLETS BY MOUTH FOUR TIMES DAILY   carbidopa-levodopa 50-200 MG tablet Commonly known as:  SINEMET CR TAKE 1 TABLET AT BEDTIME   docusate sodium 100 MG capsule Commonly known as:  COLACE Take 100-200 mg by mouth 2 (two) times daily as needed for mild constipation (depending on constipation).   fexofenadine 180 MG tablet Commonly known as:  ALLEGRA Take 180 mg by mouth daily.   FLUoxetine 10 MG capsule Commonly known as:  PROZAC Take 3 capsules (30 mg total) by mouth daily.   fluticasone 50 MCG/ACT nasal spray Commonly known as:  FLONASE Place 2 sprays daily into both nostrils.   Lidocaine 0.5 % Gel Apply 1 application topically 3 (three) times daily as needed.   losartan 100 MG tablet Commonly known as:  COZAAR Take 1 tablet (100 mg total) by mouth daily.   MYRBETRIQ 25 MG Tb24 tablet Generic drug:  mirabegron ER Take 25 mg by mouth  daily. At [place] :  Urea 40%, apply daily to affected areas.   polyethylene glycol packet Commonly known as:  MIRALAX / GLYCOLAX Take 17 g by mouth daily as needed for mild constipation. For laxative   PROBIOTIC DAILY PO Take 1 capsule by mouth daily.   ROLAIDS PO Take 2 tablets by mouth as needed (indigestion).   tamsulosin  0.4 MG Caps capsule Commonly known as:  FLOMAX Take 0.4 mg by mouth daily after supper.          Objective:   Physical Exam BP 128/78 (BP Location: Left Arm, Patient Position: Sitting, Cuff Size: Small)   Pulse 68   Temp 97.9 F (36.6 C) (Oral)   Resp 14   Ht 5\' 10"  (1.778 m)   Wt 152 lb 6 oz (69.1 kg)   SpO2 96%   BMI 21.86 kg/m  General:   Well developed, well nourished . NAD.  HEENT:  Normocephalic . Face symmetric, atraumatic Lungs:  CTA B Normal respiratory effort, no intercostal retractions, no accessory muscle use. Heart: RRR,  no murmur.  +/+++ pretibial edema bilaterally  Skin: Not pale. Not jaundice Neurologic:  alert & oriented X3.  Speech normal, gait assisted  by a cane, improved compared to the last visit Psych--  Cognition and judgment appear intact.  Cooperative with normal attention span and concentration.  Behavior appropriate. No anxious or depressed appearing.      Assessment & Plan:    Assessment HTN Hyperlipidemia Depression - anxiety: Lexapro intolerant 10-2016 OSA, mild per remote sleep study (~2008?), no CPAP Parkinson disease Dr. Carles Collet Dementia, mild GU: Dr. Junious Silk --BPH, elevated PSA --Bladder cancer-low-grade --OAB -- E.D. Dysphagia, MBE 09-2013 normal Dry mouth  DJD- back pain, sees Dr Nelva Bush, s/p local injections, on prn hydrocodone, takes rarely  L > R LE edema: [place] (-) DVT 04-2016 HOH   PLAN: DJD, back pain: S/p lumbar microdiscectomy, definitely improved, managing pain with Tylenol:  3 g daily and occasional Advil.  Recommend to drop Tylenol to  2 g daily if possible, continue with a Advil occasionally, follow his physical therapy plan. He is somewhat frustrated because pain is not completely gone, I asked him to see the fact that he has improved significantly and  fortunately the complicated surgery he had went well. Anxiety depression: Still has occasional anxiety, counseled, call if interested in increase fluoxetine to 40 mg daily Constipation: (Not taking narcotics).  Recommend Colace daily and MiraLAX as needed. Flu shot today RTC 2-3 months

## 2017-03-04 NOTE — Assessment & Plan Note (Signed)
DJD, back pain: S/p lumbar microdiscectomy, definitely improved, managing pain with Tylenol:  3 g daily and occasional Advil.  Recommend to drop Tylenol to 2 g daily if possible, continue with a Advil occasionally, follow his physical therapy plan. He is somewhat frustrated because pain is not completely gone, I asked him to see the fact that he has improved significantly and  fortunately the complicated surgery he had went well. Anxiety depression: Still has occasional anxiety, counseled, call if interested in increase fluoxetine to 40 mg daily Constipation: (Not taking narcotics).  Recommend Colace daily and MiraLAX as needed. Flu shot today RTC 2-3 months

## 2017-03-14 ENCOUNTER — Encounter: Payer: Self-pay | Admitting: Internal Medicine

## 2017-03-14 ENCOUNTER — Other Ambulatory Visit: Payer: Self-pay | Admitting: Internal Medicine

## 2017-03-15 DIAGNOSIS — L821 Other seborrheic keratosis: Secondary | ICD-10-CM | POA: Diagnosis not present

## 2017-03-15 DIAGNOSIS — Z85828 Personal history of other malignant neoplasm of skin: Secondary | ICD-10-CM | POA: Diagnosis not present

## 2017-03-16 MED ORDER — FLUOXETINE HCL 20 MG PO TABS: 40.0000 mg | ORAL_TABLET | Freq: Every day | ORAL | 3 refills | Status: DC

## 2017-03-16 NOTE — Telephone Encounter (Signed)
Rx sent 

## 2017-03-16 NOTE — Telephone Encounter (Signed)
See message. send a RX fluoxetine 20 mg 2 p.o. daily, #60 , 3 refills  Received: Yesterday  Message Contents  Colon Branch, MD  Damita Dunnings, [place]

## 2017-03-25 ENCOUNTER — Encounter: Payer: Self-pay | Admitting: Internal Medicine

## 2017-03-26 MED ORDER — LIDOCAINE 0.5 % EX GEL: 1.0000 "application " | Freq: Three times a day (TID) | CUTANEOUS | 6 refills | Status: DC | PRN

## 2017-03-26 NOTE — Addendum Note (Signed)
Addended byDamita Dunnings D on: 03/26/2017 02:22 PM   Modules accepted: Orders

## 2017-03-31 DIAGNOSIS — C674 Malignant neoplasm of posterior wall of bladder: Secondary | ICD-10-CM | POA: Diagnosis not present
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
## 2017-04-06 ENCOUNTER — Other Ambulatory Visit: Payer: Self-pay

## 2017-04-06 ENCOUNTER — Other Ambulatory Visit: Payer: Self-pay | Admitting: Urology

## 2017-04-06 ENCOUNTER — Ambulatory Visit: Payer: Medicare Other | Admitting: Neurology

## 2017-04-06 ENCOUNTER — Other Ambulatory Visit (HOSPITAL_COMMUNITY): Payer: Self-pay | Admitting: *Deleted

## 2017-04-06 ENCOUNTER — Encounter (HOSPITAL_COMMUNITY): Payer: Self-pay | Admitting: *Deleted

## 2017-04-06 ENCOUNTER — Encounter: Payer: Self-pay | Admitting: Neurology

## 2017-04-06 VITALS — BP 130/66 | HR 70 | Ht 70.5 in | Wt 156.0 lb

## 2017-04-06 DIAGNOSIS — G20 Parkinson's disease: Secondary | ICD-10-CM

## 2017-04-06 DIAGNOSIS — M545 Low back pain, unspecified: Secondary | ICD-10-CM

## 2017-04-06 DIAGNOSIS — F0280 Dementia in other diseases classified elsewhere without behavioral disturbance: Secondary | ICD-10-CM

## 2017-04-06 DIAGNOSIS — G8929 Other chronic pain: Secondary | ICD-10-CM | POA: Diagnosis not present

## 2017-04-06 DIAGNOSIS — G20A1 Parkinson's disease without dyskinesia, without mention of fluctuations: Secondary | ICD-10-CM

## 2017-04-06 NOTE — Progress Notes (Signed)
Patient unable to come for pre op appointment due to  Other appointments, left message for connie mabe at [facility] urology.

## 2017-04-09 ENCOUNTER — Encounter (HOSPITAL_COMMUNITY)
Admission: RE | Disposition: A | Discharge status: Home / Self Care | Payer: Self-pay | Source: Ambulatory Visit | Attending: Urology

## 2017-04-09 ENCOUNTER — Ambulatory Visit (HOSPITAL_COMMUNITY): Payer: Medicare Other

## 2017-04-09 ENCOUNTER — Ambulatory Visit (HOSPITAL_COMMUNITY): Payer: Medicare Other | Admitting: Anesthesiology

## 2017-04-09 ENCOUNTER — Ambulatory Visit (HOSPITAL_COMMUNITY)
Admission: RE | Admit: 2017-04-09 | Discharge: 2017-04-09 | Disposition: A | Discharge status: Home / Self Care | Payer: Medicare Other | Source: Ambulatory Visit | Attending: Urology | Admitting: Urology

## 2017-04-09 ENCOUNTER — Encounter (HOSPITAL_COMMUNITY): Payer: Self-pay | Admitting: Emergency Medicine

## 2017-04-09 ENCOUNTER — Other Ambulatory Visit: Payer: Self-pay

## 2017-04-09 DIAGNOSIS — E785 Hyperlipidemia, unspecified: Secondary | ICD-10-CM | POA: Insufficient documentation

## 2017-04-09 DIAGNOSIS — Z87891 Personal history of nicotine dependence: Secondary | ICD-10-CM | POA: Insufficient documentation

## 2017-04-09 DIAGNOSIS — I10 Essential (primary) hypertension: Secondary | ICD-10-CM | POA: Insufficient documentation

## 2017-04-09 DIAGNOSIS — N400 Enlarged prostate without lower urinary tract symptoms: Secondary | ICD-10-CM | POA: Diagnosis not present

## 2017-04-09 DIAGNOSIS — Z79899 Other long term (current) drug therapy: Secondary | ICD-10-CM | POA: Diagnosis not present

## 2017-04-09 DIAGNOSIS — C674 Malignant neoplasm of posterior wall of bladder: Secondary | ICD-10-CM | POA: Diagnosis not present

## 2017-04-09 DIAGNOSIS — M19019 Primary osteoarthritis, unspecified shoulder: Secondary | ICD-10-CM | POA: Insufficient documentation

## 2017-04-09 DIAGNOSIS — G4733 Obstructive sleep apnea (adult) (pediatric): Secondary | ICD-10-CM | POA: Insufficient documentation

## 2017-04-09 DIAGNOSIS — F329 Major depressive disorder, single episode, unspecified: Secondary | ICD-10-CM | POA: Diagnosis not present

## 2017-04-09 DIAGNOSIS — N362 Urethral caruncle: Secondary | ICD-10-CM | POA: Insufficient documentation

## 2017-04-09 DIAGNOSIS — N342 Other urethritis: Secondary | ICD-10-CM | POA: Diagnosis not present

## 2017-04-09 DIAGNOSIS — M170 Bilateral primary osteoarthritis of knee: Secondary | ICD-10-CM | POA: Insufficient documentation

## 2017-04-09 DIAGNOSIS — F419 Anxiety disorder, unspecified: Secondary | ICD-10-CM | POA: Diagnosis not present

## 2017-04-09 DIAGNOSIS — K219 Gastro-esophageal reflux disease without esophagitis: Secondary | ICD-10-CM | POA: Diagnosis not present

## 2017-04-09 DIAGNOSIS — M5136 Other intervertebral disc degeneration, lumbar region: Secondary | ICD-10-CM | POA: Diagnosis not present

## 2017-04-09 DIAGNOSIS — N3281 Overactive bladder: Secondary | ICD-10-CM | POA: Diagnosis not present

## 2017-04-09 DIAGNOSIS — G20 Parkinson's disease: Secondary | ICD-10-CM | POA: Insufficient documentation

## 2017-04-09 DIAGNOSIS — Z87442 Personal history of urinary calculi: Secondary | ICD-10-CM | POA: Insufficient documentation

## 2017-04-09 DIAGNOSIS — C679 Malignant neoplasm of bladder, unspecified: Secondary | ICD-10-CM | POA: Diagnosis not present

## 2017-04-09 HISTORY — DX: Adverse effect of unspecified anesthetic, initial encounter: T41.45XA

## 2017-04-09 HISTORY — DX: Other complications of anesthesia, initial encounter: T88.59XA

## 2017-04-09 HISTORY — PX: TRANSURETHRAL RESECTION OF BLADDER TUMOR: SHX2575

## 2017-04-09 HISTORY — PX: CYSTOSCOPY: SHX5120

## 2017-04-09 LAB — BASIC METABOLIC PANEL
ANION GAP: 6 (ref 5–15)
BUN: 23 mg/dL — ABNORMAL HIGH (ref 6–20)
CALCIUM: 9 mg/dL (ref 8.9–10.3)
CO2: 24 mmol/L (ref 22–32)
CREATININE: 0.7 mg/dL (ref 0.61–1.24)
Chloride: 108 mmol/L (ref 101–111)
GFR calc Af Amer: >60 mL/min (ref >60)
GFR calc non Af Amer: >60 mL/min (ref >60)
GLUCOSE: 97 mg/dL (ref 65–99)
Potassium: 3.9 mmol/L (ref 3.5–5.1)
Sodium: 138 mmol/L (ref 135–145)

## 2017-04-09 LAB — CBC
HCT: 38.4 % — ABNORMAL LOW (ref 39.0–52.0)
HEMOGLOBIN: 12.6 g/dL — AB (ref 13.0–17.0)
MCH: 31.7 pg (ref 26.0–34.0)
MCHC: 32.8 g/dL (ref 30.0–36.0)
MCV: 96.7 fL (ref 78.0–100.0)
Platelets: 220 10*3/uL (ref 150–400)
RBC: 3.97 MIL/uL — ABNORMAL LOW (ref 4.22–5.81)
RDW: 13.9 % (ref 11.5–15.5)
WBC: 5.7 10*3/uL (ref 4.0–10.5)

## 2017-04-09 SURGERY — TURBT (TRANSURETHRAL RESECTION OF BLADDER TUMOR)
Anesthesia: General | Site: Ureter

## 2017-04-09 MED ORDER — HYDROMORPHONE HCL 1 MG/ML IJ SOLN
0.2500 mg | INTRAMUSCULAR | Status: DC | PRN
  Administered 2017-04-09: 0.25 mg via INTRAVENOUS

## 2017-04-09 MED ORDER — ONDANSETRON HCL 4 MG/2ML IJ SOLN: 4.0000 mg | Freq: Once | INTRAMUSCULAR | Status: DC | PRN

## 2017-04-09 MED ORDER — EPHEDRINE 5 MG/ML INJ
INTRAVENOUS | Status: AC
  Filled 2017-04-09: qty 10

## 2017-04-09 MED ORDER — CEFAZOLIN SODIUM-DEXTROSE 2-4 GM/100ML-% IV SOLN
2.0000 g | Freq: Once | INTRAVENOUS | Status: AC
  Administered 2017-04-09: 2 g via INTRAVENOUS
  Filled 2017-04-09: qty 100

## 2017-04-09 MED ORDER — SODIUM CHLORIDE 0.9 % IV SOLN
50.0000 mg | Freq: Once | INTRAVENOUS | Status: AC
  Administered 2017-04-09: 50 mg via INTRAVESICAL
  Filled 2017-04-09: qty 25

## 2017-04-09 MED ORDER — DEXAMETHASONE SODIUM PHOSPHATE 10 MG/ML IJ SOLN
INTRAMUSCULAR | Status: AC
  Filled 2017-04-09: qty 1

## 2017-04-09 MED ORDER — ONDANSETRON HCL 4 MG/2ML IJ SOLN
INTRAMUSCULAR | Status: AC
  Filled 2017-04-09: qty 2

## 2017-04-09 MED ORDER — 0.9 % SODIUM CHLORIDE (POUR BTL) OPTIME
TOPICAL | Status: DC | PRN
Start: 2017-04-09 — End: 2017-04-09
  Administered 2017-04-09: 1000 mL

## 2017-04-09 MED ORDER — EPHEDRINE SULFATE-NACL 50-0.9 MG/10ML-% IV SOSY
PREFILLED_SYRINGE | INTRAVENOUS | Status: DC | PRN
  Administered 2017-04-09 (×2): 10 mg via INTRAVENOUS

## 2017-04-09 MED ORDER — ONDANSETRON HCL 4 MG/2ML IJ SOLN
INTRAMUSCULAR | Status: DC | PRN
  Administered 2017-04-09: 4 mg via INTRAVENOUS

## 2017-04-09 MED ORDER — OXYCODONE HCL 5 MG PO TABS: 5.0000 mg | ORAL_TABLET | Freq: Once | ORAL | Status: DC | PRN

## 2017-04-09 MED ORDER — OXYCODONE HCL 5 MG/5ML PO SOLN
5.0000 mg | Freq: Once | ORAL | Status: DC | PRN
  Filled 2017-04-09: qty 5

## 2017-04-09 MED ORDER — SODIUM CHLORIDE 0.9 % IV SOLN: 80.0000 mg | Freq: Once | INTRAVENOUS | Status: DC

## 2017-04-09 MED ORDER — STERILE WATER FOR IRRIGATION IR SOLN
Status: DC | PRN
  Administered 2017-04-09: 3000 mL via INTRAVESICAL

## 2017-04-09 MED ORDER — PROPOFOL 10 MG/ML IV BOLUS
INTRAVENOUS | Status: DC | PRN
  Administered 2017-04-09: 160 mg via INTRAVENOUS

## 2017-04-09 MED ORDER — LACTATED RINGERS IV SOLN
INTRAVENOUS | Status: DC
  Administered 2017-04-09: 09:00:00 via INTRAVENOUS

## 2017-04-09 MED ORDER — HYDROMORPHONE HCL 1 MG/ML IJ SOLN
INTRAMUSCULAR | Status: AC
  Filled 2017-04-09: qty 1

## 2017-04-09 MED ORDER — SODIUM CHLORIDE 0.9 % IV SOLN
INTRAVENOUS | Status: DC | PRN
  Administered 2017-04-09: 14 mL

## 2017-04-09 MED ORDER — STERILE WATER FOR IRRIGATION IR SOLN
Status: DC | PRN
Start: 2017-04-09 — End: 2017-04-09
  Administered 2017-04-09: 1000 mL

## 2017-04-09 MED ORDER — FENTANYL CITRATE (PF) 100 MCG/2ML IJ SOLN
INTRAMUSCULAR | Status: AC
  Filled 2017-04-09: qty 2

## 2017-04-09 MED ORDER — LIDOCAINE 2% (20 MG/ML) 5 ML SYRINGE
INTRAMUSCULAR | Status: AC
  Filled 2017-04-09: qty 5

## 2017-04-09 MED ORDER — FENTANYL CITRATE (PF) 100 MCG/2ML IJ SOLN
INTRAMUSCULAR | Status: DC | PRN
  Administered 2017-04-09: 50 ug via INTRAVENOUS
  Administered 2017-04-09 (×2): 25 ug via INTRAVENOUS

## 2017-04-09 MED ORDER — PROPOFOL 10 MG/ML IV BOLUS
INTRAVENOUS | Status: AC
Start: 2017-04-09 — End: ?
  Filled 2017-04-09: qty 20

## 2017-04-09 MED ORDER — LIDOCAINE 2% (20 MG/ML) 5 ML SYRINGE
INTRAMUSCULAR | Status: DC | PRN
  Administered 2017-04-09: 100 mg via INTRAVENOUS

## 2017-04-09 MED ORDER — DEXAMETHASONE SODIUM PHOSPHATE 10 MG/ML IJ SOLN
INTRAMUSCULAR | Status: DC | PRN
  Administered 2017-04-09: 10 mg via INTRAVENOUS

## 2017-04-09 SURGICAL SUPPLY — 21 items
BAG URINE DRAINAGE (UROLOGICAL SUPPLIES) ×1 IMPLANT
BAG URO CATCHER STRL LF (MISCELLANEOUS) ×3 IMPLANT
BASKET ZERO TIP NITINOL 2.4FR (BASKET) IMPLANT
BSKT STON RTRVL ZERO TP 2.4FR (BASKET)
CATH INTERMIT  6FR 70CM (CATHETERS) IMPLANT
CATH TIEMANN FOLEY 18FR 5CC (CATHETERS) ×1 IMPLANT
CLOTH BEACON ORANGE TIMEOUT ST (SAFETY) ×3 IMPLANT
COVER FOOTSWITCH UNIV (MISCELLANEOUS) ×2 IMPLANT
COVER SURGICAL LIGHT HANDLE (MISCELLANEOUS) ×3 IMPLANT
GLOVE BIOGEL M STRL SZ7.5 (GLOVE) ×3 IMPLANT
GLOVE BIOGEL PI IND STRL 7.0 (GLOVE) IMPLANT
GLOVE BIOGEL PI INDICATOR 7.0 (GLOVE) ×1
GLOVE ECLIPSE 7.0 STRL STRAW (GLOVE) ×1 IMPLANT
GOWN STRL REUS W/TWL LRG LVL3 (GOWN DISPOSABLE) ×4 IMPLANT
GOWN STRL REUS W/TWL XL LVL3 (GOWN DISPOSABLE) ×3 IMPLANT
GUIDEWIRE ANG ZIPWIRE 038X150 (WIRE) IMPLANT
GUIDEWIRE STR DUAL SENSOR (WIRE) ×3 IMPLANT
LOOP CUT BIPOLAR 24F LRG (ELECTROSURGICAL) IMPLANT
MANIFOLD NEPTUNE II (INSTRUMENTS) ×3 IMPLANT
PACK CYSTO (CUSTOM PROCEDURE TRAY) ×3 IMPLANT
TUBING CONNECTING 10 (TUBING) ×3 IMPLANT

## 2017-04-09 NOTE — H&P (Signed)
H&P  Chief Complaint: bladder neoplasm  History of Present Illness: 83 yo WM with h/o LG Ta bladder cancer and a papillary recurrence in left bladder on recent office cystoscopy. He's been well with no fever or dysuria.   Past Medical History:  Diagnosis Date  . Allergic rhinitis   . Anxiety   . Bladder cancer [facility])    urologist-  dr Deandrew Hoecker--  low grade TA  . BPH with elevated PSA   . Complication of anesthesia    "paranoid after back surgery in october lasted 2 to 3 days"  . DDD (degenerative disc disease), lumbar   . Depression   . Dry mouth    USES BIOTIN SPRAY/ MOUTHWASH  . Frequency of urination   . GERD (gastroesophageal reflux disease)   . Hearing loss    does not wear his hearing aid  . History of kidney stones    10/1989  . Hyperlipidemia   . Hypertension   . Mild obstructive sleep apnea    PER PT  MILD OSA , NO CPAP RX,  STUDY DONE 2009  . Nocturia   . OAB (overactive bladder)   . Osteoarthritis    KNEES, SHOULDER  . Parkinson disease ([place]) DX   2005   NEUROLOGIST-   DR TAT--  IDIOPATHIC PARKINSON'S /  TREMORS CONTROLLED WITH MEDS  . Urge urinary incontinence    Past Surgical History:  Procedure Laterality Date  . CATARACT EXTRACTION W/ INTRAOCULAR LENS  IMPLANT, BILATERAL  2014  . CYSTOSCOPY W/ RETROGRADES Bilateral 12/14/2014   Procedure: CYSTOSCOPY BLADDER BIOPSY FULGERATION MITOMYCIN C BILATERAL RETROGRADE PYELOGRAM;  Surgeon: Festus Aloe, MD;  Location: [facility];  Service: Urology;  Laterality: Bilateral;  . CYSTOSCOPY WITH STENT PLACEMENT Left 12/19/2013   Procedure: CYSTOSCOPY BLADDER BX, LEFT URETERAL STENT PLACEMENT , LEFT RETROGRADE AND PYLOGRAM;  Surgeon: Festus Aloe, MD;  Location: [facility];  Service: Urology;  Laterality: Left;  . EYE SURGERY Bilateral    ioc for cataract  . HERNIA REPAIR  2013   inguinal  . INGUINAL HERNIA REPAIR  03/09/2012   Procedure: HERNIA REPAIR INGUINAL ADULT;  Surgeon: Adin Hector, MD;  Location: [facility];  Service: General;  Laterality: Right;  . INSERTION OF MESH  03/09/2012   Procedure: INSERTION OF MESH;  Surgeon: Adin Hector, MD;  Location: [facility];  Service: General;  Laterality: Right;  . LUMBAR LAMINECTOMY/DECOMPRESSION MICRODISCECTOMY N/A 02/05/2017   Procedure: Laminectomy and Foraminotomy - Lumbar One-Two,Lumbar Two-Three, Lumbar Three-Four.;  Surgeon: Eustace Moore, MD;  Location: [facility];  Service: Neurosurgery;  Laterality: N/A;  . NASAL SINUS SURGERY  1982  . ORIF RIGHT ARM FX  1949   HARDWARE REMOVED   . REMOVAL BENIGN CYST LEFT FOREHEAD  1969  . TRANSURETHRAL RESECTION OF BLADDER TUMOR N/A 12/19/2013   Procedure:  TRANSURETHRAL RESECTION OF BLADDER TUMOR WITH GYRUS (TURBT-GYRUS);  Surgeon: Festus Aloe, MD;  Location: [facility];  Service: Urology;  Laterality: N/A;    Home Medications:  Medications Prior to Admission  Medication Sig Dispense Refill Last Dose  . acetaminophen (TYLENOL) 500 MG tablet Take 500-1,000 mg by mouth See admin instructions. 1000 mg in the morning, 500 mg at noon, 500 mg at 1600, and 1000 mg at night   04/08/2017 at Unknown time  . atorvastatin (LIPITOR) 40 MG tablet Take 1 tablet (40 mg total) by mouth daily. (Patient taking differently: Take 40 mg by mouth every evening. At 2000) 90  tablet 1 04/08/2017 at Unknown time  . Ca Carbonate-Mag Hydroxide (ROLAIDS PO) Take 2 tablets by mouth as needed (indigestion).    Past Week at Unknown time  . carbidopa-levodopa (SINEMET CR) 50-200 MG tablet TAKE 1 TABLET AT BEDTIME 90 tablet 0 04/08/2017 at Unknown time  . carbidopa-levodopa (SINEMET IR) 25-100 MG tablet TAKE TWO TABLETS BY MOUTH FOUR TIMES DAILY 240 tablet 5 04/09/2017 at 0630  . docusate sodium (COLACE) 50 MG capsule Take 50-100 mg by mouth See admin instructions. Take 100 mg in the morning and 50 mg at 4pm   04/08/2017 at Unknown time  . fexofenadine (ALLEGRA) 180 MG tablet Take 180 mg by mouth  daily. At 1600   04/08/2017 at Unknown time  . FLUoxetine (PROZAC) 20 MG capsule Take 40 mg by mouth daily.   04/09/2017 at 0630  . fluticasone (FLONASE) 50 MCG/ACT nasal spray Place 2 sprays daily into both nostrils. (Patient taking differently: Place 2 sprays into both nostrils daily as needed for allergies. ) 16 g 5 04/09/2017 at 0700  . ibuprofen (ADVIL,MOTRIN) 200 MG tablet Take 400 mg by mouth every 8 (eight) hours as needed.   04/06/2017  . lidocaine (XYLOCAINE) 5 % ointment Apply 1 application topically 3 (three) times daily as needed for mild pain.  35.44 g 0 Past Week at Unknown time  . losartan (COZAAR) 100 MG tablet Take 1 tablet (100 mg total) by mouth daily. (Patient taking differently: Take 100 mg by mouth daily at 12 noon. ) 90 tablet 1 04/08/2017 at Unknown time  . MYRBETRIQ 25 MG TB24 tablet Take 25 mg by mouth daily. At 1600  10 04/08/2017 at Unknown time  . polyethylene glycol (MIRALAX / GLYCOLAX) packet Take 17 g by mouth daily as needed for mild constipation.    Past Month at Unknown time  . Polyvinyl Alcohol-Povidone (REFRESH OP) Place 1-2 drops into both eyes daily as needed (dry eyes).   Past Week at Unknown time  . tamsulosin (FLOMAX) 0.4 MG CAPS capsule Take 0.4 mg by mouth daily after supper.   11 04/08/2017 at Unknown time  . NONFORMULARY OR COMPOUNDED ITEM [facility]:  Urea 40%, apply daily to affected areas. (Patient not taking: Reported on 04/07/2017) 120 each 2 Not Taking at Unknown time  . Probiotic Product (PROBIOTIC DAILY PO) Take 1 capsule by mouth daily.   04/06/2017   Allergies:  Allergies  Allergen Reactions  . Other Other (See Comments)    Paranoia from anesthesia drug last visit-unknown drug     Family History  Problem Relation Age of Onset  . Alzheimer's disease Mother   . Liver cancer Father   . Cancer Father        colon  . Prostate cancer Neg Hx   . CAD Neg Hx    Social History:  reports that he quit smoking about 49 years ago. His  smoking use included cigarettes. He has a 15.00 pack-year smoking history. he has never used smokeless tobacco. He reports that he drinks about 4.2 oz of alcohol per week. He reports that he does not use drugs.  ROS: A complete review of systems was performed.  All systems are negative except for pertinent findings as noted. ROS   Physical Exam:  Vital signs in last 24 hours: Temp:  [97.6 F (36.4 C)] 97.6 F (36.4 C) (12/21 0800) Pulse Rate:  [75] 75 (12/21 0800) Resp:  [18] 18 (12/21 0800) BP: (133)/(72) 133/72 (12/21 0800) SpO2:  [98 %] 98 % (  12/21 0800) Weight:  [70.1 kg (154 lb 8 oz)] 70.1 kg (154 lb 8 oz) (12/21 0800) General:  Alert and oriented, No acute distress HEENT: Normocephalic, atraumatic Cardiovascular: Regular rate and rhythm Lungs: Regular rate and effort Abdomen: Soft, nontender, nondistended, no abdominal masses Extremities: No edema Neurologic: Grossly intact  Laboratory Data:  Results for orders placed or performed during the hospital encounter of 04/09/17 (from the past 24 hour(s))  Basic metabolic panel     Status: Abnormal   Collection Time: 04/09/17  8:16 AM  Result Value Ref Range   Sodium 138 135 - 145 mmol/L   Potassium 3.9 3.5 - 5.1 mmol/L   Chloride 108 101 - 111 mmol/L   CO2 24 22 - 32 mmol/L   Glucose, Bld 97 65 - 99 mg/dL   BUN 23 (H) 6 - 20 mg/dL   Creatinine, Ser 0.70 0.61 - 1.24 mg/dL   Calcium 9.0 8.9 - 10.3 mg/dL   GFR calc non Af Amer >60 >60 mL/min   GFR calc Af Amer >60 >60 mL/min   Anion gap 6 5 - 15  CBC     Status: Abnormal   Collection Time: 04/09/17  8:16 AM  Result Value Ref Range   WBC 5.7 4.0 - 10.5 K/uL   RBC 3.97 (L) 4.22 - 5.81 MIL/uL   Hemoglobin 12.6 (L) 13.0 - 17.0 g/dL   HCT 38.4 (L) 39.0 - 52.0 %   MCV 96.7 78.0 - 100.0 fL   MCH 31.7 26.0 - 34.0 pg   MCHC 32.8 30.0 - 36.0 g/dL   RDW 13.9 11.5 - 15.5 %   Platelets 220 150 - 400 K/uL   No results found for this or any previous visit (from the past 240  hour(s)). Creatinine: Recent Labs    04/09/17 0816  CREATININE 0.70    Impression/Assessment:  Bladder neoplasm - recurrence -   Plan:  I discussed with the patient again the nature, potential benefits, risks and alternatives to cystoscopy, RGP's. Bbx/TURBT and post-op epirubicin instillation, including side effects of the proposed treatment, the likelihood of the patient achieving the goals of the procedure, and any potential problems that might occur during the procedure or recuperation. All questions answered. Patient elects to proceed.    Festus Aloe 04/09/2017, 10:22 AM

## 2017-04-09 NOTE — Anesthesia Procedure Notes (Signed)
Procedure Name: LMA Insertion Date/Time: 04/09/2017 10:43 AM Performed by: Jarelis Ehlert D, CRNA Pre-anesthesia Checklist: Patient identified, Emergency Drugs available, Suction available and Patient being monitored Patient Re-evaluated:Patient Re-evaluated prior to induction Oxygen Delivery Method: Circle system utilized Preoxygenation: Pre-oxygenation with 100% oxygen Induction Type: IV induction Ventilation: Mask ventilation without difficulty LMA: LMA inserted LMA Size: 4.0 Tube type: Oral Number of attempts: 1 Placement Confirmation: positive ETCO2 and breath sounds checked- equal and bilateral Tube secured with: Tape Dental Injury: Teeth and Oropharynx as per pre-operative assessment

## 2017-04-09 NOTE — Anesthesia Preprocedure Evaluation (Addendum)
Anesthesia Evaluation  Patient identified by MRN, date of birth, ID band Patient awake    Reviewed: Allergy & Precautions, NPO status , Patient's Chart, lab work & pertinent test results  Airway Mallampati: II  TM Distance: >3 FB Neck ROM: Full    Dental  (+) Teeth Intact, Dental Advisory Given   Pulmonary former smoker,    breath sounds clear to auscultation       Cardiovascular hypertension,  Rhythm:Regular Rate:Normal     Neuro/Psych    GI/Hepatic   Endo/Other    Renal/GU      Musculoskeletal   Abdominal   Peds  Hematology   Anesthesia Other Findings   Reproductive/Obstetrics                             Anesthesia Physical Anesthesia Plan  ASA: III  Anesthesia Plan: General   Post-op Pain Management:    Induction: Intravenous  PONV Risk Score and Plan: Ondansetron and Dexamethasone  Airway Management Planned: LMA  Additional Equipment:   Intra-op Plan:   Post-operative Plan: Extubation in OR  Informed Consent: I have reviewed the patients History and Physical, chart, labs and discussed the procedure including the risks, benefits and alternatives for the proposed anesthesia with the patient or authorized representative who has indicated his/her understanding and acceptance.     Dental advisory given  Plan Discussed with: CRNA and Anesthesiologist  Anesthesia Plan Comments:         Anesthesia Quick Evaluation  

## 2017-04-09 NOTE — Discharge Instructions (Signed)
Cystoscopy, Care After  Refer to this sheet in the next few weeks. These instructions provide you with information about caring for yourself after your procedure. Your health care provider may also give you more specific instructions. Your treatment has been planned according to current medical practices, but problems sometimes occur. Call your health care provider if you have any problems or questions after your procedure.  What can I expect after the procedure?  After the procedure, it is common to have:   Mild pain when you urinate. Pain should stop within a few minutes after you urinate. This may last for up to 1 week.   A small amount of blood in your urine for several days.   Feeling like you need to urinate but producing only a small amount of urine.    Follow these instructions at home:    Medicines   Take over-the-counter and prescription medicines only as told by your health care provider.   If you were prescribed an antibiotic medicine, take it as told by your health care provider. Do not stop taking the antibiotic even if you start to feel better.  General instructions     Return to your normal activities as told by your health care provider. Ask your health care provider what activities are safe for you.   Do not drive for 24 hours if you received a sedative.   Watch for any blood in your urine. If the amount of blood in your urine increases, call your health care provider.   Follow instructions from your health care provider about eating or drinking restrictions.   If a tissue sample was removed for testing (biopsy) during your procedure, it is your responsibility to get your test results. Ask your health care provider or the department performing the test when your results will be ready.   Drink enough fluid to keep your urine clear or pale yellow.   Keep all follow-up visits as told by your health care provider. This is important.  Contact a health care provider if:   You have pain that  gets worse or does not get better with medicine, especially pain when you urinate.   You have difficulty urinating.  Get help right away if:   You have more blood in your urine.   You have blood clots in your urine.   You have abdominal pain.   You have a fever or chills.   You are unable to urinate.  This information is not intended to replace advice given to you by your health care provider. Make sure you discuss any questions you have with your health care provider.  Document Released: 10/24/2004 Document Revised: 09/12/2015 Document Reviewed: 02/21/2015  Elsevier Interactive Patient Education  2018 [employer]

## 2017-04-09 NOTE — Op Note (Addendum)
Preoperative diagnosis: Bladder neoplasm Postoperative diagnosis: Same  Procedure: Cystoscopy with bilateral retrograde pyelogram,  TURBT 2 - 5 cm, instillation of epirubicin in PACU  Surgeon: Junious Silk  Anesthesia: General   indication for procedure: 83 year old with history of low-grade TA bladder neoplasm, recurrence noted on recent office cystoscopy.  Findings: On exam under anesthesia the penis was circumcised without mass or lesion.  Testicles descended bilaterally and palpably normal.  Digital rectal exam prostate was smooth without hard area or nodule.  On cystoscopy the urethra appeared normal, there was a urethral polyp bridging from the membranous urethra posteriorly into the apical prostatic urethral area anterior.  Prostate mildly enlarged with an median lobe component.  In the bladder, there were areas of papillary neoplastic recurrence of the left posterior bladder.  1 posterior inferior medial to the left ureteral orifice.  Another one on the posterior left bladder wall.  Total area treated about 3.5 cm with the left inferior area about 2 cm and left posterior about 1.5 cm.   Left retrograde pyelogram-this outlined a single ureter single collecting system unit without filling defect, stricture or dilation.  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  Brisk drainage of contrast.  Right retrograde pyelogram-this outlined a single ureter single collecting system unit without filling defect, stricture or dilation.  Description of procedure: After consent was obtained the patient brought to the operating room.  He was placed in lithotomy position.  An exam under anesthesia was performed.  He was prepped and draped in the usual sterile fashion.  A timeout was performed to confirm the patient and procedure.  The cystoscope was passed per urethra and the bladder inspected with a 30 degree and 70 degree lens.  I  irrigated the bladder several times.  The left ureteral orifice was cannulated with a 6 [place] open-ended catheter and retrograde injection of contrast was performed.  Right retrograde was obtained in a similar fashion.  The papillary recurrence seems superficial and was loose on the surface.  It seemed best to cold cup these areas and then fulgurated with the Bugbee.  The posterior inferior area was biopsied and then all the abnormal mucosa completely fulgurated.  I used the flexible biopsy forceps on this portion and the rigid biopsy forceps to biopsy the left posterior and this entire area was fulgurated with the Bugbee.  No other abnormal mucosal areas remain.  Hemostasis was excellent.  In the urethra the urethral polyp was grasped and removed.  The connection points posteriorly and anterior were lightly fulgurated.  An 35 French coud catheter was placed with clear drainage for epirubicin instillation in the PACU.  He was awakened taken to the recovery room in stable condition.  Installation of epirubicin-in the recovery room 50 mg of epirubicin was instilled per the catheter into the patient's bladder.  It was left indwelling for 15 minutes and then drained.  Complications: None  Blood loss: Minimal  Specimens to pathology: #1 left posterior inferior bladder #2 left posterior #3 urethral polyp  Drains: 18 French coud catheter  Disposition: Patient stable to PACU

## 2017-04-09 NOTE — Anesthesia Postprocedure Evaluation (Signed)
Anesthesia Post Note  Patient: [name]  Procedure(s) Performed: TRANSURETHRAL RESECTION OF BLADDER TUMOR / (TURBT) 2-5cm (N/A Bladder) CYSTOSCOPY/ RETROGRADE (Bilateral Ureter)     Patient location during evaluation: PACU Anesthesia Type: General Level of consciousness: awake, awake and alert and oriented Pain management: pain level controlled Vital Signs Assessment: post-procedure vital signs reviewed and stable Respiratory status: spontaneous breathing, nonlabored ventilation and respiratory function stable Cardiovascular status: blood pressure returned to baseline Anesthetic complications: no    Last Vitals:  Vitals:   04/09/17 1145 04/09/17 1200  BP: (!) 170/75 (!) 172/77  Pulse: 91 93  Resp: 13 14  Temp:    SpO2: 100% 98%    Last Pain:  Vitals:   04/09/17 0800  TempSrc: Oral                 Michell Giuliano COKER

## 2017-04-09 NOTE — Transfer of Care (Signed)
Immediate Anesthesia Transfer of Care Note  Patient: [name]  Procedure(s) Performed: TRANSURETHRAL RESECTION OF BLADDER TUMOR / (TURBT) 2-5cm (N/A Bladder) CYSTOSCOPY/ RETROGRADE (Bilateral Ureter)  Patient Location: PACU  Anesthesia Type:General  Level of Consciousness: awake, alert  and oriented  Airway & Oxygen Therapy: Patient Spontanous Breathing and Patient connected to face mask oxygen  Post-op Assessment: Report given to RN and Post -op Vital signs reviewed and stable  Post vital signs: Reviewed and stable  Last Vitals:  Vitals:   04/09/17 0800  BP: 133/72  Pulse: 75  Resp: 18  Temp: 36.4 C  SpO2: 98%    Last Pain:  Vitals:   04/09/17 0800  TempSrc: Oral      Patients Stated Pain Goal: 4 (20/23/34 3568)  Complications: No apparent anesthesia complications

## 2017-04-30 NOTE — Progress Notes (Deleted)
Subjective:   [name] is a 84 y.o. male who presents for Medicare Annual/Subsequent preventive examination. The Patient was informed that the wellness visit is to identify future health risk and educate and initiate measures that can reduce risk for increased disease through the lifespan.   Describes health as fair, good or great?  Review of Systems:  No ROS.  Medicare Wellness Visit. Additional risk factors are reflected in the social history.   Sleep patterns   Home Safety/Smoke Alarms: Feels safe in home. Smoke alarms in place.   Male:   CCS- Last 02/12/09. Recall 3 yrs   PSA-  Lab Results  Component Value Date   PSA 4.33 (H) 04/17/2013   PSA 3.63 03/23/2012   PSA 2.95 03/18/2011       Objective:    Vitals: There were no vitals taken for this visit.  There is no height or weight on file to calculate BMI.  Advanced Directives 04/06/2017 02/01/2017 01/04/2017 09/05/2015 02/06/2015 01/30/2015 01/22/2015  Does Patient Have a Medical Advance Directive? Yes Yes No No Yes Yes Yes  Type of Paramedic of [place];Living will Living will - - Living will Living will Living will  Does patient want to make changes to medical advance directive? No - Patient declined - - - No - Patient declined No - Patient declined No - Patient declined  Copy of [place] in Chart? Yes - - - No - copy requested No - copy requested No - copy requested  Would patient like information on creating a medical advance directive? - - - No - patient declined information - - -  Pre-existing out of facility DNR order (yellow form or pink MOST form) - - - - - - -    Tobacco Social History   Tobacco Use  Smoking Status Former Smoker  . Packs/day: 1.00  . Years: 15.00  . Pack years: 15.00  . Types: Cigarettes  . Last attempt to quit: 04/20/1968  . Years since quitting: 49.0  Smokeless Tobacco Never Used     Counseling given: Not Answered   Clinical  Intake:                       Past Medical History:  Diagnosis Date  . Allergic rhinitis   . Anxiety   . Bladder cancer [facility])    urologist-  dr eskridge--  low grade TA  . BPH with elevated PSA   . Complication of anesthesia    "paranoid after back surgery in october lasted 2 to 3 days"  . DDD (degenerative disc disease), lumbar   . Depression   . Dry mouth    USES BIOTIN SPRAY/ MOUTHWASH  . Frequency of urination   . GERD (gastroesophageal reflux disease)   . Hearing loss    does not wear his hearing aid  . History of kidney stones    10/1989  . Hyperlipidemia   . Hypertension   . Mild obstructive sleep apnea    PER PT  MILD OSA , NO CPAP RX,  STUDY DONE 2009  . Nocturia   . OAB (overactive bladder)   . Osteoarthritis    KNEES, SHOULDER  . Parkinson disease ([place]) DX   2005   NEUROLOGIST-   DR TAT--  IDIOPATHIC PARKINSON'S /  TREMORS CONTROLLED WITH MEDS  . Urge urinary incontinence    Past Surgical History:  Procedure Laterality Date  . CATARACT EXTRACTION W/ INTRAOCULAR LENS  IMPLANT, BILATERAL  2014  . CYSTOSCOPY Bilateral 04/09/2017   Procedure: CYSTOSCOPY/ RETROGRADE;  Surgeon: Festus Aloe, MD;  [facility];  Service: Urology;  Laterality: Bilateral;  . CYSTOSCOPY W/ RETROGRADES Bilateral 12/14/2014   Procedure: CYSTOSCOPY BLADDER BIOPSY FULGERATION MITOMYCIN C BILATERAL RETROGRADE PYELOGRAM;  Surgeon: Festus Aloe, MD;  Location: [facility];  Service: Urology;  Laterality: Bilateral;  . CYSTOSCOPY WITH STENT PLACEMENT Left 12/19/2013   Procedure: CYSTOSCOPY BLADDER BX, LEFT URETERAL STENT PLACEMENT , LEFT RETROGRADE AND PYLOGRAM;  Surgeon: Festus Aloe, MD;  Location: [facility];  Service: Urology;  Laterality: Left;  . EYE SURGERY Bilateral    ioc for cataract  . HERNIA REPAIR  2013   inguinal  . INGUINAL HERNIA REPAIR  03/09/2012   Procedure: HERNIA REPAIR INGUINAL ADULT;  Surgeon: Adin Hector,  MD;  [facility];  Service: General;  Laterality: Right;  . INSERTION OF MESH  03/09/2012   Procedure: INSERTION OF MESH;  Surgeon: Adin Hector, MD;  [facility];  Service: General;  Laterality: Right;  . LUMBAR LAMINECTOMY/DECOMPRESSION MICRODISCECTOMY N/A 02/05/2017   Procedure: Laminectomy and Foraminotomy - Lumbar One-Two,Lumbar Two-Three, Lumbar Three-Four.;  Surgeon: Eustace Moore, MD;  Location: [facility];  Service: Neurosurgery;  Laterality: N/A;  . NASAL SINUS SURGERY  1982  . ORIF RIGHT ARM FX  1949   HARDWARE REMOVED   . REMOVAL BENIGN CYST LEFT FOREHEAD  1969  . TRANSURETHRAL RESECTION OF BLADDER TUMOR N/A 12/19/2013   Procedure:  TRANSURETHRAL RESECTION OF BLADDER TUMOR WITH GYRUS (TURBT-GYRUS);  Surgeon: Festus Aloe, MD;  Location: [facility];  Service: Urology;  Laterality: N/A;  . TRANSURETHRAL RESECTION OF BLADDER TUMOR N/A 04/09/2017   Procedure: TRANSURETHRAL RESECTION OF BLADDER TUMOR / (TURBT) 2-5cm;  Surgeon: Festus Aloe, MD;  [facility];  Service: Urology;  Laterality: N/A;  ONLY NEEDS 90 MIN FOR BOTH PROCEDURES   Family History  Problem Relation Age of Onset  . Alzheimer's disease Mother   . Liver cancer Father   . Cancer Father        colon  . Prostate cancer Neg Hx   . CAD Neg Hx    Social History   Socioeconomic History  . Marital status: Married    Spouse name: [name]  . Number of children: 1  . Years of education: Not on file  . Highest education level: Not on file  Social Needs  . Financial resource strain: Not on file  . Food insecurity - worry: Not on file  . Food insecurity - inability: Not on file  . Transportation needs - medical: Not on file  . Transportation needs - non-medical: Not on file  Occupational History  . Occupation: retired 1995, worked in [place] , finances   Tobacco Use  . Smoking status: Former Smoker    Packs/day: 1.00    Years: 15.00    Pack years: 15.00    Types: Cigarettes     Last attempt to quit: 04/20/1968    Years since quitting: 49.0  . Smokeless tobacco: Never Used  Substance and Sexual Activity  . Alcohol use: Yes    Alcohol/week: 4.2 oz    Types: 7 Glasses of wine per week    Comment: ONE WINE PER DAY  . Drug use: No  . Sexual activity: Not on file  Other Topics Concern  . Not on file  Social History Narrative   Lives w/ wife   Has a Daughter  45 y/o    Outpatient Encounter Medications as of 05/04/2017  Medication Sig  . acetaminophen (TYLENOL) 500 MG tablet Take 500-1,000 mg by mouth See admin instructions. 1000 mg in the morning, 500 mg at noon, 500 mg at 1600, and 1000 mg at night  . atorvastatin (LIPITOR) 40 MG tablet Take 1 tablet (40 mg total) by mouth daily. (Patient taking differently: Take 40 mg by mouth every evening. At 2000)  . Ca Carbonate-Mag Hydroxide (ROLAIDS PO) Take 2 tablets by mouth as needed (indigestion).   . carbidopa-levodopa (SINEMET CR) 50-200 MG tablet TAKE 1 TABLET AT BEDTIME  . carbidopa-levodopa (SINEMET IR) 25-100 MG tablet TAKE TWO TABLETS BY MOUTH FOUR TIMES DAILY  . docusate sodium (COLACE) 50 MG capsule Take 50-100 mg by mouth See admin instructions. Take 100 mg in the morning and 50 mg at 4pm  . fexofenadine (ALLEGRA) 180 MG tablet Take 180 mg by mouth daily. At 1600  . FLUoxetine (PROZAC) 20 MG capsule Take 40 mg by mouth daily.  . fluticasone (FLONASE) 50 MCG/ACT nasal spray Place 2 sprays daily into both nostrils. (Patient taking differently: Place 2 sprays into both nostrils daily as needed for allergies. )  . ibuprofen (ADVIL,MOTRIN) 200 MG tablet Take 400 mg by mouth every 8 (eight) hours as needed.  . lidocaine (XYLOCAINE) 5 % ointment Apply 1 application topically 3 (three) times daily as needed for mild pain.   Marland Kitchen losartan (COZAAR) 100 MG tablet Take 1 tablet (100 mg total) by mouth daily. (Patient taking differently: Take 100 mg by mouth daily at 12 noon. )  . MYRBETRIQ 25 MG TB24 tablet Take 25 mg by mouth  daily. At 1600  . NONFORMULARY OR COMPOUNDED ITEM [facility]:  Urea 40%, apply daily to affected areas. (Patient not taking: Reported on 04/07/2017)  . polyethylene glycol (MIRALAX / GLYCOLAX) packet Take 17 g by mouth daily as needed for mild constipation.   . Polyvinyl Alcohol-Povidone (REFRESH OP) Place 1-2 drops into both eyes daily as needed (dry eyes).  . Probiotic Product (PROBIOTIC DAILY PO) Take 1 capsule by mouth daily.  . tamsulosin (FLOMAX) 0.4 MG CAPS capsule Take 0.4 mg by mouth daily after supper.    No facility-administered encounter medications on file as of 05/04/2017.     Activities of Daily Living In your present state of health, do you have any difficulty performing the following activities: 04/09/2017 02/01/2017  Hearing? N Y  Comment - lately, its becoming a issue--has aids, doesn't wear  Vision? N N  Comment - has 'floaters' in eyes now  Difficulty concentrating or making decisions? N Y  Walking or climbing stairs? Y Y  Dressing or bathing? Y Y  Doing errands, shopping? - Y  Some recent data might be hidden    Patient Care Team: Colon Branch, MD as PCP - General (Internal Medicine) Gatha Mayer, MD as Consulting Physician (Gastroenterology) Festus Aloe, MD as Consulting Physician (Urology) Tat, Eustace Quail, DO as Consulting Physician (Neurology) Suella Broad, MD as Consulting Physician (Physical Medicine and Rehabilitation)   Assessment:   This is a routine wellness examination for [place]. Physical assessment deferred to PCP.  Exercise Activities and Dietary recommendations   Diet (meal preparation, eat out, water intake, caffeinated beverages, dairy products, fruits and vegetables): {Desc; diets:16563} Breakfast: Lunch:  Dinner:      Goals    None      Fall Risk Fall Risk  04/06/2017 08/06/2016 05/12/2016 03/10/2016 09/12/2015  Falls in the past  year? No Yes Yes Yes No  Number falls in past yr: - 1 2 or more 2 or more -  Injury with  Fall? - No No No -  Risk Factor Category  - - High Fall Risk - -  Risk for fall due to : - - Impaired balance/gait;History of fall(s) - -  Follow up - Falls evaluation completed Falls evaluation completed;Education provided;Falls prevention discussed Falls evaluation completed -   Depression Screen PHQ 2/9 Scores 05/12/2016 09/12/2015 05/01/2015 10/23/2014  PHQ - 2 Score 0 0 2 0  PHQ- 9 Score - - 9 -  Exception Documentation - - Other- indicate reason in comment box -  Not completed - - Anxiety -    Cognitive Function MMSE - Mini Mental State Exam 04/06/2017 08/06/2016 03/10/2016  Not completed: Refused Unable to complete Refused        Immunization History  Administered Date(s) Administered  . Influenza Split 03/18/2011, 01/13/2012  . Influenza Whole 02/28/2007, 01/30/2008, 01/09/2009, 02/12/2010  . Influenza, High Dose Seasonal PF 02/15/2013, 05/12/2016, 03/03/2017  . Influenza,inj,Quad PF,6+ Mos 04/26/2014, 05/01/2015  . Pneumococcal Conjugate-13 10/23/2014  . Pneumococcal Polysaccharide-23 04/17/2013  . Td 10/29/2011  . Zoster Recombinat (Shingrix) 06/16/2016, 08/14/2016   Screening Tests Health Maintenance  Topic Date Due  . TETANUS/TDAP  10/28/2021  . INFLUENZA VACCINE  Completed  . PNA vac Low Risk Adult  Completed    Plan:   ***  I have personally reviewed and noted the following in the patient's chart:   . Medical and social history . Use of alcohol, tobacco or illicit drugs  . Current medications and supplements . Functional ability and status . Nutritional status . Physical activity . Advanced directives . List of other physicians . Hospitalizations, surgeries, and ER visits in previous 12 months . Vitals . Screenings to include cognitive, depression, and falls . Referrals and appointments  In addition, I have reviewed and discussed with patient certain preventive protocols, quality metrics, and best practice recommendations. A written personalized care  plan for preventive services as well as general preventive health recommendations were provided to patient.     Shela Nevin, [place]  04/30/2017

## 2017-05-03 DIAGNOSIS — M48061 Spinal stenosis, lumbar region without neurogenic claudication: Secondary | ICD-10-CM | POA: Diagnosis not present

## 2017-05-04 ENCOUNTER — Ambulatory Visit: Payer: Medicare Other | Admitting: Internal Medicine

## 2017-05-04 ENCOUNTER — Encounter: Payer: Self-pay | Admitting: Internal Medicine

## 2017-05-04 VITALS — BP 136/66 | HR 64 | Temp 98.0°F | Resp 14 | Ht 71.0 in | Wt 156.4 lb

## 2017-05-04 DIAGNOSIS — M549 Dorsalgia, unspecified: Secondary | ICD-10-CM

## 2017-05-04 DIAGNOSIS — G8929 Other chronic pain: Secondary | ICD-10-CM | POA: Diagnosis not present

## 2017-05-04 DIAGNOSIS — E785 Hyperlipidemia, unspecified: Secondary | ICD-10-CM

## 2017-05-04 DIAGNOSIS — C670 Malignant neoplasm of trigone of bladder: Secondary | ICD-10-CM

## 2017-05-04 NOTE — Progress Notes (Signed)
Subjective:    Patient ID: [name], male    DOB: 02/19/1934, 84 y.o.   MRN: [id number]  DOS:  05/04/2017 Type of visit - description : rov, here with his wife and daughter Interval history:  Back pain: Had surgery a few months ago, pain decreased but never completely stopped.  Currently pain is around the right hip, buttock.  Went to Dr. Nelva Bush office yesterday, was prescribed prednisone.  They are considering a local injection as well Bladder cancer: Still has nocturia, urology recently increased Myrbetriq dose. HTN: Good compliance with medication.  BP today is very good. High cholesterol: Wonders if stop or discontinue Lipitor is a good idea because MSK issues and his age   Review of Systems   Past Medical History:  Diagnosis Date  . Allergic rhinitis   . Anxiety   . Bladder cancer [facility])    urologist-  dr eskridge--  low grade TA  . BPH with elevated PSA   . Complication of anesthesia    "paranoid after back surgery in october lasted 2 to 3 days"  . DDD (degenerative disc disease), lumbar   . Depression   . Dry mouth    USES BIOTIN SPRAY/ MOUTHWASH  . Frequency of urination   . GERD (gastroesophageal reflux disease)   . Hearing loss    does not wear his hearing aid  . History of kidney stones    10/1989  . Hyperlipidemia   . Hypertension   . Mild obstructive sleep apnea    PER PT  MILD OSA , NO CPAP RX,  STUDY DONE 2009  . Nocturia   . OAB (overactive bladder)   . Osteoarthritis    KNEES, SHOULDER  . Parkinson disease ([place]) DX   2005   NEUROLOGIST-   DR TAT--  IDIOPATHIC PARKINSON'S /  TREMORS CONTROLLED WITH MEDS  . Urge urinary incontinence     Past Surgical History:  Procedure Laterality Date  . CATARACT EXTRACTION W/ INTRAOCULAR LENS  IMPLANT, BILATERAL  2014  . CYSTOSCOPY Bilateral 04/09/2017   Procedure: CYSTOSCOPY/ RETROGRADE;  Surgeon: Festus Aloe, MD;  [facility];  Service: Urology;  Laterality: Bilateral;  . CYSTOSCOPY W/ RETROGRADES  Bilateral 12/14/2014   Procedure: CYSTOSCOPY BLADDER BIOPSY FULGERATION MITOMYCIN C BILATERAL RETROGRADE PYELOGRAM;  Surgeon: Festus Aloe, MD;  Location: [facility];  Service: Urology;  Laterality: Bilateral;  . CYSTOSCOPY WITH STENT PLACEMENT Left 12/19/2013   Procedure: CYSTOSCOPY BLADDER BX, LEFT URETERAL STENT PLACEMENT , LEFT RETROGRADE AND PYLOGRAM;  Surgeon: Festus Aloe, MD;  Location: [facility];  Service: Urology;  Laterality: Left;  . EYE SURGERY Bilateral    ioc for cataract  . HERNIA REPAIR  2013   inguinal  . INGUINAL HERNIA REPAIR  03/09/2012   Procedure: HERNIA REPAIR INGUINAL ADULT;  Surgeon: Adin Hector, MD;  [facility];  Service: General;  Laterality: Right;  . INSERTION OF MESH  03/09/2012   Procedure: INSERTION OF MESH;  Surgeon: Adin Hector, MD;  [facility];  Service: General;  Laterality: Right;  . LUMBAR LAMINECTOMY/DECOMPRESSION MICRODISCECTOMY N/A 02/05/2017   Procedure: Laminectomy and Foraminotomy - Lumbar One-Two,Lumbar Two-Three, Lumbar Three-Four.;  Surgeon: Eustace Moore, MD;  Location: [place];  Service: Neurosurgery;  Laterality: N/A;  . NASAL SINUS SURGERY  1982  . ORIF RIGHT ARM FX  1949   HARDWARE REMOVED   . REMOVAL BENIGN CYST LEFT FOREHEAD  1969  . TRANSURETHRAL RESECTION OF BLADDER TUMOR N/A  12/19/2013   Procedure:  TRANSURETHRAL RESECTION OF BLADDER TUMOR WITH GYRUS (TURBT-GYRUS);  Surgeon: Festus Aloe, MD;  Location: [facility];  Service: Urology;  Laterality: N/A;  . TRANSURETHRAL RESECTION OF BLADDER TUMOR N/A 04/09/2017   Procedure: TRANSURETHRAL RESECTION OF BLADDER TUMOR / (TURBT) 2-5cm;  Surgeon: Festus Aloe, MD;  [facility];  Service: Urology;  Laterality: N/A;  ONLY NEEDS 90 MIN FOR BOTH PROCEDURES    Social History   Socioeconomic History  . Marital status: Married    Spouse name: [name]  . Number of children: 1  . Years of education: Not on file  .  Highest education level: Not on file  Social Needs  . Financial resource strain: Not on file  . Food insecurity - worry: Not on file  . Food insecurity - inability: Not on file  . Transportation needs - medical: Not on file  . Transportation needs - non-medical: Not on file  Occupational History  . Occupation: retired 1995, worked in [place] , finances   Tobacco Use  . Smoking status: Former Smoker    Packs/day: 1.00    Years: 15.00    Pack years: 15.00    Types: Cigarettes    Last attempt to quit: 04/20/1968    Years since quitting: 49.0  . Smokeless tobacco: Never Used  Substance and Sexual Activity  . Alcohol use: Yes    Alcohol/week: 4.2 oz    Types: 7 Glasses of wine per week    Comment: ONE WINE PER DAY  . Drug use: No  . Sexual activity: Not on file  Other Topics Concern  . Not on file  Social History Narrative   Lives w/ wife   Has a Daughter 46 y/o      Allergies as of 05/04/2017      Reactions   Other Other (See Comments)   Paranoia from anesthesia drug last visit-unknown drug       Medication List        Accurate as of 05/04/17  5:52 PM. Always use your most recent med list.          acetaminophen 500 MG tablet Commonly known as:  TYLENOL Take 500-1,000 mg by mouth See admin instructions. 1000 mg in the morning, 500 mg at noon, 500 mg at 1600, and 1000 mg at night   atorvastatin 40 MG tablet Commonly known as:  LIPITOR Take 1 tablet (40 mg total) by mouth daily.   carbidopa-levodopa 25-100 MG tablet Commonly known as:  SINEMET IR TAKE TWO TABLETS BY MOUTH FOUR TIMES DAILY   carbidopa-levodopa 50-200 MG tablet Commonly known as:  SINEMET CR TAKE 1 TABLET AT BEDTIME   docusate sodium 50 MG capsule Commonly known as:  COLACE Take 50-100 mg by mouth See admin instructions. Take 100 mg in the morning and 50 mg at 4pm   fexofenadine 180 MG tablet Commonly known as:  ALLEGRA Take 180 mg by mouth daily. At 1600   FLUoxetine 20 MG  capsule Commonly known as:  PROZAC Take 40 mg by mouth daily.   fluticasone 50 MCG/ACT nasal spray Commonly known as:  FLONASE Place 2 sprays daily into both nostrils.   ibuprofen 200 MG tablet Commonly known as:  ADVIL,MOTRIN Take 400 mg by mouth every 8 (eight) hours as needed.   lidocaine 5 % ointment Commonly known as:  XYLOCAINE Apply 1 application topically 3 (three) times daily as needed for mild pain.   losartan 100 MG tablet Commonly known  as:  COZAAR Take 1 tablet (100 mg total) by mouth daily.   MYRBETRIQ 25 MG Tb24 tablet Generic drug:  mirabegron ER Take 50 mg by mouth daily. At [place]:  Urea 40%, apply daily to affected areas.   polyethylene glycol packet Commonly known as:  MIRALAX / GLYCOLAX Take 17 g by mouth daily as needed for mild constipation.   PROBIOTIC DAILY PO Take 1 capsule by mouth daily.   REFRESH OP Place 1-2 drops into both eyes daily as needed (dry eyes).   ROLAIDS PO Take 2 tablets by mouth as needed (indigestion).   tamsulosin 0.4 MG Caps capsule Commonly known as:  FLOMAX Take 0.4 mg by mouth daily after supper.          Objective:   Physical Exam BP 136/66 (BP Location: Left Arm, Patient Position: Sitting, Cuff Size: Normal)   Pulse 64   Temp 98 F (36.7 C) (Oral)   Resp 14   Ht 5\' 11"  (1.803 m)   Wt 156 lb 6 oz (70.9 kg)   SpO2 94%   BMI 21.81 kg/m  General:   Well developed, well nourished . NAD.  HEENT:  Normocephalic . Face symmetric, atraumatic Lungs:  CTA B Normal respiratory effort, no intercostal retractions, no accessory muscle use. Heart: RRR,  no murmur.  +/+++ pretibial edema bilaterally  Skin: Not pale. Not jaundice Neurologic:  alert & oriented X3.  Speech normal, gait assisted  by a cane  Psych--  Cognition and judgment appear intact.  Cooperative with normal attention span and concentration.  Behavior appropriate. Seems to be in good  spirits     Assessment & Plan:    Assessment HTN Hyperlipidemia Depression - anxiety: Lexapro intolerant 10-2016 OSA, mild per remote sleep study (~2008?), no CPAP Parkinson disease Dr. Carles Collet Dementia, mild GU: Dr. Junious Silk --BPH, elevated PSA --Bladder cancer-low-grade --OAB -- E.D. Dysphagia, MBE 09-2013 normal Dry mouth  DJD- back pain, sees Dr Nelva Bush, s/p local injections, on prn hydrocodone, surgery 2018 L > R LE edema: [place] (-) DVT 04-2016 HOH   PLAN: Here for a checkup, overall he is doing okay for the amount of medical problems he face.  Recent labs are stable. DJD, back pain: Pain improved after surgery but not completely gone.  He is taking 2 Advil's at night, recommend to go back to a slightly higher dose of Tylenol around 3000 mg daily and try to minimize the use Advil to 2 at night but not every day.  He was just prescribed prednisone by Dr. Nelva Bush office.  They will follow-up on this issue Bladder cancer: Still has nocturia, Myrbetriq dose recently increased. High cholesterol: Wonder if he could stop Lipitor b/c "his age" and due to multiple MSK issues.  I am not opposed to stop Lipitor if it is will increase his quality of life; for now we agreed to hold Lipitor for 4 weeks and see if that makes any difference in his aches and pains. RTC 3-4 months.

## 2017-05-04 NOTE — Assessment & Plan Note (Signed)
Here for a checkup, overall he is doing okay for the amount of medical problems he face.  Recent labs are stable. DJD, back pain: Pain improved after surgery but not completely gone.  He is taking 2 Advil's at night, recommend to go back to a slightly higher dose of Tylenol around 3000 mg daily and try to minimize the use Advil to 2 at night but not every day.  He was just prescribed prednisone by Dr. Nelva Bush office.  They will follow-up on this issue Bladder cancer: Still has nocturia, Myrbetriq dose recently increased. High cholesterol: Wonder if he could stop Lipitor b/c "his age" and due to multiple MSK issues.  I am not opposed to stop Lipitor if it is will increase his quality of life; for now we agreed to hold Lipitor for 4 weeks and see if that makes any difference in his aches and pains. RTC 3-4 months.

## 2017-05-04 NOTE — Patient Instructions (Signed)
GO TO THE FRONT DESK Schedule your next appointment for a  Check up in 3 to 4 months    Check the  blood pressure  weekly   Be sure your blood pressure is between 110/65 and  135/85. If it is consistently higher or lower, let me know  Tylenol  500 mg OTC 2 tabs a day every 8 hours as needed for pain  IBUPROFEN (Advil or Motrin) 200 mg 2 tablets a day  as needed for pain.  Always take it with food because may cause gastritis and ulcers.  If you notice nausea, stomach pain, change in the color of stools --->  Stop the medicine and let us know   Hold lipitor x 4 weeks

## 2017-05-13 ENCOUNTER — Other Ambulatory Visit: Payer: Self-pay | Admitting: Neurology

## 2017-06-14 ENCOUNTER — Other Ambulatory Visit: Payer: Self-pay | Admitting: Internal Medicine

## 2017-06-15 DIAGNOSIS — M5416 Radiculopathy, lumbar region: Secondary | ICD-10-CM | POA: Diagnosis not present

## 2017-07-14 ENCOUNTER — Encounter: Payer: Self-pay | Admitting: Internal Medicine

## 2017-07-17 ENCOUNTER — Other Ambulatory Visit: Payer: Self-pay | Admitting: Internal Medicine

## 2017-07-20 ENCOUNTER — Other Ambulatory Visit: Payer: Self-pay | Admitting: Neurology

## 2017-08-02 ENCOUNTER — Encounter: Payer: Self-pay | Admitting: Internal Medicine

## 2017-08-03 ENCOUNTER — Other Ambulatory Visit: Payer: Self-pay | Admitting: Internal Medicine

## 2017-08-03 MED ORDER — CLONAZEPAM 0.25 MG PO TBDP: 0.2500 mg | ORAL_TABLET | Freq: Two times a day (BID) | ORAL | 0 refills | Status: DC | PRN

## 2017-08-17 ENCOUNTER — Encounter: Payer: Self-pay | Admitting: Internal Medicine

## 2017-08-17 ENCOUNTER — Ambulatory Visit: Payer: Medicare Other | Admitting: Internal Medicine

## 2017-08-17 VITALS — BP 116/68 | HR 73 | Temp 98.1°F | Resp 14 | Ht 71.0 in | Wt 151.2 lb

## 2017-08-17 DIAGNOSIS — I10 Essential (primary) hypertension: Secondary | ICD-10-CM

## 2017-08-17 DIAGNOSIS — M549 Dorsalgia, unspecified: Secondary | ICD-10-CM

## 2017-08-17 DIAGNOSIS — G8929 Other chronic pain: Secondary | ICD-10-CM

## 2017-08-17 DIAGNOSIS — F32A Depression, unspecified: Secondary | ICD-10-CM

## 2017-08-17 DIAGNOSIS — F329 Major depressive disorder, single episode, unspecified: Secondary | ICD-10-CM | POA: Diagnosis not present

## 2017-08-17 NOTE — Patient Instructions (Addendum)
  GO TO THE FRONT DESK Schedule your next appointment for a checkup in 3 months. Please come back fasting    Use Carbamide peroxide  (" earwax treatment drops") or peroxide: Few drops in your ears daily for 1 week to clear the wax  Call if not improving

## 2017-08-17 NOTE — Progress Notes (Signed)
Pre visit review using our clinic review tool, if applicable. No additional management support is needed unless otherwise documented below in the visit note. 

## 2017-08-17 NOTE — Progress Notes (Signed)
Subjective:    Patient ID: [name], male    DOB: 05/21/1933, 84 y.o.   MRN: [id number]  DOS:  08/17/2017 Type of visit - description : rov Interval history: Here with his wife Back pain: "Still have a discomfort" currently taking Tylenol. Not using a walking anymore Reports decreased hearing both sides, denies tinnitus Anxiety: On and off issue, intolerant to clonazepam Not taking Lipitor HTN: No ambulatory BPs    Review of Systems   Past Medical History:  Diagnosis Date  . Allergic rhinitis   . Anxiety   . Bladder cancer [facility])    urologist-  dr eskridge--  low grade TA  . BPH with elevated PSA   . Complication of anesthesia    "paranoid after back surgery in october lasted 2 to 3 days"  . DDD (degenerative disc disease), lumbar   . Depression   . Dry mouth    USES BIOTIN SPRAY/ MOUTHWASH  . Frequency of urination   . GERD (gastroesophageal reflux disease)   . Hearing loss    does not wear his hearing aid  . History of kidney stones    10/1989  . Hyperlipidemia   . Hypertension   . Mild obstructive sleep apnea    PER PT  MILD OSA , NO CPAP RX,  STUDY DONE 2009  . Nocturia   . OAB (overactive bladder)   . Osteoarthritis    KNEES, SHOULDER  . Parkinson disease ([place]) DX   2005   NEUROLOGIST-   DR TAT--  IDIOPATHIC PARKINSON'S /  TREMORS CONTROLLED WITH MEDS  . Urge urinary incontinence     Past Surgical History:  Procedure Laterality Date  . CATARACT EXTRACTION W/ INTRAOCULAR LENS  IMPLANT, BILATERAL  2014  . CYSTOSCOPY Bilateral 04/09/2017   Procedure: CYSTOSCOPY/ RETROGRADE;  Surgeon: Festus Aloe, MD;  Location: [facility];  Service: Urology;  Laterality: Bilateral;  . CYSTOSCOPY W/ RETROGRADES Bilateral 12/14/2014   Procedure: CYSTOSCOPY BLADDER BIOPSY FULGERATION MITOMYCIN C BILATERAL RETROGRADE PYELOGRAM;  Surgeon: Festus Aloe, MD;  Location: [facility];  Service: Urology;  Laterality: Bilateral;  . CYSTOSCOPY WITH STENT  PLACEMENT Left 12/19/2013   Procedure: CYSTOSCOPY BLADDER BX, LEFT URETERAL STENT PLACEMENT , LEFT RETROGRADE AND PYLOGRAM;  Surgeon: Festus Aloe, MD;  Location: [facility];  Service: Urology;  Laterality: Left;  . EYE SURGERY Bilateral    ioc for cataract  . HERNIA REPAIR  2013   inguinal  . INGUINAL HERNIA REPAIR  03/09/2012   Procedure: HERNIA REPAIR INGUINAL ADULT;  Surgeon: Adin Hector, MD;  Location: [facility];  Service: General;  Laterality: Right;  . INSERTION OF MESH  03/09/2012   Procedure: INSERTION OF MESH;  Surgeon: Adin Hector, MD;  Location: [facility];  Service: General;  Laterality: Right;  . LUMBAR LAMINECTOMY/DECOMPRESSION MICRODISCECTOMY N/A 02/05/2017   Procedure: Laminectomy and Foraminotomy - Lumbar One-Two,Lumbar Two-Three, Lumbar Three-Four.;  Surgeon: Eustace Moore, MD;  Location: [place];  Service: Neurosurgery;  Laterality: N/A;  . NASAL SINUS SURGERY  1982  . ORIF RIGHT ARM FX  1949   HARDWARE REMOVED   . REMOVAL BENIGN CYST LEFT FOREHEAD  1969  . TRANSURETHRAL RESECTION OF BLADDER TUMOR N/A 12/19/2013   Procedure:  TRANSURETHRAL RESECTION OF BLADDER TUMOR WITH GYRUS (TURBT-GYRUS);  Surgeon: Festus Aloe, MD;  Location: [facility];  Service: Urology;  Laterality: N/A;  . TRANSURETHRAL RESECTION OF BLADDER TUMOR N/A 04/09/2017   Procedure: TRANSURETHRAL RESECTION OF BLADDER TUMOR / (  TURBT) 2-5cm;  Surgeon: Festus Aloe, MD;  Location: [facility];  Service: Urology;  Laterality: N/A;  ONLY NEEDS 90 MIN FOR BOTH PROCEDURES    Social History   Socioeconomic History  . Marital status: Married    Spouse name: [name]  . Number of children: 1  . Years of education: Not on file  . Highest education level: Not on file  Occupational History  . Occupation: retired 1995, worked in [place] , finances   Social Needs  . Financial resource strain: Not on file  . Food insecurity:    Worry: Not on file    Inability: Not on file  .  Transportation needs:    Medical: Not on file    Non-medical: Not on file  Tobacco Use  . Smoking status: Former Smoker    Packs/day: 1.00    Years: 15.00    Pack years: 15.00    Types: Cigarettes    Last attempt to quit: 04/20/1968    Years since quitting: 49.3  . Smokeless tobacco: Never Used  Substance and Sexual Activity  . Alcohol use: Yes    Alcohol/week: 4.2 oz    Types: 7 Glasses of wine per week    Comment: ONE WINE PER DAY  . Drug use: No  . Sexual activity: Not on file  Lifestyle  . Physical activity:    Days per week: Not on file    Minutes per session: Not on file  . Stress: Not on file  Relationships  . Social connections:    Talks on phone: Not on file    Gets together: Not on file    Attends religious service: Not on file    Active member of club or organization: Not on file    Attends meetings of clubs or organizations: Not on file    Relationship status: Not on file  . Intimate partner violence:    Fear of current or ex partner: Not on file    Emotionally abused: Not on file    Physically abused: Not on file    Forced sexual activity: Not on file  Other Topics Concern  . Not on file  Social History Narrative   Lives w/ wife   Has a Daughter 89 y/o      Allergies as of 08/17/2017      Reactions   Other Other (See Comments)   Paranoia from anesthesia drug last visit-unknown drug       Medication List        Accurate as of 08/17/17 11:59 PM. Always use your most recent med list.          acetaminophen 500 MG tablet Commonly known as:  TYLENOL Take 500-1,000 mg by mouth See admin instructions. 1000 mg in the morning, 500 mg at noon, 500 mg at 1600, and 1000 mg at night   carbidopa-levodopa 50-200 MG tablet Commonly known as:  SINEMET CR TAKE 1 TABLET BY MOUTH EVERYDAY AT BEDTIME   carbidopa-levodopa 25-100 MG tablet Commonly known as:  SINEMET IR TAKE TWO TABLETS BY MOUTH FOUR TIMES DAILY   docusate sodium 50 MG capsule Commonly known  as:  COLACE Take 50-100 mg by mouth See admin instructions. Take 100 mg in the morning and 50 mg at 4pm   fexofenadine 180 MG tablet Commonly known as:  ALLEGRA Take 180 mg by mouth daily. At 1600   FLUoxetine 20 MG capsule Commonly known as:  PROZAC Take 2 capsules (40 mg total) by mouth daily.  fluticasone 50 MCG/ACT nasal spray Commonly known as:  FLONASE Place 2 sprays daily into both nostrils.   ibuprofen 200 MG tablet Commonly known as:  ADVIL,MOTRIN Take 400 mg by mouth every 8 (eight) hours as needed.   lidocaine 5 % ointment Commonly known as:  XYLOCAINE Apply 1 application topically 3 (three) times daily as needed for mild pain.   losartan 100 MG tablet Commonly known as:  COZAAR Take 1 tablet (100 mg total) by mouth daily.   MYRBETRIQ 25 MG Tb24 tablet Generic drug:  mirabegron ER Take 50 mg by mouth daily. At [place]:  Urea 40%, apply daily to affected areas.   polyethylene glycol packet Commonly known as:  MIRALAX / GLYCOLAX Take 17 g by mouth daily as needed for mild constipation.   PROBIOTIC DAILY PO Take 1 capsule by mouth daily.   REFRESH OP Place 1-2 drops into both eyes daily as needed (dry eyes).   ROLAIDS PO Take 2 tablets by mouth as needed (indigestion).   tamsulosin 0.4 MG Caps capsule Commonly known as:  FLOMAX Take 0.4 mg by mouth daily after supper.          Objective:   Physical Exam BP 116/68 (BP Location: Left Arm, Patient Position: Sitting, Cuff Size: Small)   Pulse 73   Temp 98.1 F (36.7 C) (Oral)   Resp 14   Ht 5\' 11"  (1.803 m)   Wt 151 lb 4 oz (68.6 kg)   SpO2 98%   BMI 21.10 kg/m  General:   Well developed, well nourished . NAD.  HEENT:  Normocephalic . Face symmetric, atraumatic.  Small amount of wax in both ears, TMs normal Lungs:  CTA B Normal respiratory effort, no intercostal retractions, no accessory muscle use. Heart: RRR,  no murmur.  No pretibial  edema bilaterally  Skin: Not pale. Not jaundice Neurologic:  alert & oriented X3.  Speech normal, gait appropriate for age and assisted by a cane Psych--  Cognition and judgment appear intact.  Cooperative with normal attention span and concentration.  Behavior appropriate. No anxious or depressed appearing.      Assessment & Plan:    Assessment HTN Hyperlipidemia Depression - anxiety: Lexapro intolerant 10-2016 OSA, mild per remote sleep study (~2008?), no CPAP Parkinson disease Dr. Carles Collet Dementia, mild GU: Dr. Junious Silk --BPH, elevated PSA --Bladder cancer-low-grade --OAB -- E.D. Dysphagia, MBE 09-2013 normal Dry mouth  DJD- back pain, sees Dr Nelva Bush, s/p local injections, on prn hydrocodone, surgery 2018 L > R LE edema: [place] (-) DVT 04-2016 HOH   PLAN: HTN: Well-controlled on losartan, last BMP satisfactory High cholesterol: Decided not to take Lipitor Depression anxiety: On Lexapro, intolerant to clonazepam d/t drowsiness. No change for now, continue SSRIs DJD, back pain: Overall I think he is improving, instead of a walker he is using a cane, taking Tylenol a total of 2500 mg daily. He continues to be somewhat frustrated because he "does not feel 125%", I told him that in general think he is doing well for the number of medical problems he has, he feels he is sometimes too pessimistic, advised him that maybe see a counselor is a good idea. Mild HOH without tinnitus: Recommend obs Cerumen management: See instructions Likes to see be seen frequently >>> RTC 3 months   F2 F 20 minutes, counseling about feeling frustrated

## 2017-08-18 NOTE — Assessment & Plan Note (Signed)
HTN: Well-controlled on losartan, last BMP satisfactory High cholesterol: Decided not to take Lipitor Depression anxiety: On Lexapro, intolerant to clonazepam d/t drowsiness. No change for now, continue SSRIs DJD, back pain: Overall I think he is improving, instead of a walker he is using a cane, taking Tylenol a total of 2500 mg daily. He continues to be somewhat frustrated because he "does not feel 125%", I told him that in general think he is doing well for the number of medical problems he has, he feels he is sometimes too pessimistic, advised him that maybe see a counselor is a good idea. Mild HOH without tinnitus: Recommend obs Cerumen management: See instructions Likes to see be seen frequently >>> RTC 3 months

## 2017-09-07 ENCOUNTER — Other Ambulatory Visit: Payer: Self-pay | Admitting: Internal Medicine

## 2017-09-07 ENCOUNTER — Other Ambulatory Visit: Payer: Self-pay | Admitting: Neurology

## 2017-09-07 NOTE — Progress Notes (Signed)
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 mild dementia.    -this has worsened with time 5.  Urinary incontinence  -seeing urology.  6.  Low back pain  -He had surgery in October, 2018.  He is doing better.    7.  Ulnar neuropathy  -precautions given to patient.  Don't lean on L elbow.  Try not to hold at 90 degrees.  Told him I didn't recommend sx.   8.  Follow up is anticipated in the next 5 months, sooner should new neurologic issues arise.  Much greater than 50% of this visit was spent in counseling and coordinating care.  Total face to face time:  40 min

## 2017-09-08 ENCOUNTER — Ambulatory Visit: Payer: Medicare Other | Admitting: Neurology

## 2017-09-08 ENCOUNTER — Encounter: Payer: Self-pay | Admitting: Neurology

## 2017-09-08 VITALS — BP 116/64 | HR 66 | Ht 70.5 in | Wt 151.0 lb

## 2017-09-08 DIAGNOSIS — F330 Major depressive disorder, recurrent, mild: Secondary | ICD-10-CM | POA: Diagnosis not present

## 2017-09-08 DIAGNOSIS — G20 Parkinson's disease: Secondary | ICD-10-CM

## 2017-09-08 DIAGNOSIS — F0280 Dementia in other diseases classified elsewhere without behavioral disturbance: Secondary | ICD-10-CM

## 2017-09-08 MED ORDER — AMBULATORY NON FORMULARY MEDICATION
1.0000 [IU] | Freq: Every day | 0 refills | Status: DC
Start: 2017-09-08 — End: 2018-07-13

## 2017-09-08 NOTE — Patient Instructions (Signed)
Parkinson's Caregiver Group   Parkinson's disease can be challenging for caregivers too. Changing  abilities and assuming new roles within the family can cause emotional  upheaval. Meeting with a group of peers who are experiencing similar changes is very beneficial for any caregiver. This professionally led support group will provide a safe place for Parkinson's caregivers to connect, share challenges, seek solutions, learn about resources and strengthen coping skills.    When:  Last Monday of the month (unless date falls on a holiday)  2019 Dates: 1/28, 2/25, 3/25, 4/29, 5/20, 6/24, 7/29, 8/26, 9/30, 10/28, 11/25, 12/30   Location: [facility]                                                                                              [place], [place]                                                                                      Room 204 Time: 2-3:30   Please call Myra Gianotti, MSW, LCSW at [phone] with any questions.    [facility] [employer] [facility]  The [facility] team know that living well with a movement disorder extends far beyond our clinic walls. We are together with you. Our team is passionate about providing resources to you and your loved ones who are living with Parkinson's disease and movement disorders. Participate in these programs and join our community. These resources are free or low cost!   [facility] is adding:   Innovative educational programs for patients and caregivers.   Support groups for patients and caregivers living with Parkinson's disease.   Parkinson's specific exercise programs.   Custom tailored therapeutic programs that will benefit patient's living with Parkinson's disease.   We are in this  together. You can help and contribute to grow these programs and resources in our community. 100% of the funds donated to the [facility] stays right here in our community to support patients and their caregivers.  To make a tax deductible contribution:  -ask for a [facility] Parkinson's envelope in the office today.  -  call the Office of Institutional Advancement at [phone].

## 2017-10-05 ENCOUNTER — Ambulatory Visit: Payer: Medicare Other | Admitting: Podiatry

## 2017-10-07 DIAGNOSIS — R3915 Urgency of urination: Secondary | ICD-10-CM | POA: Diagnosis not present

## 2017-10-07 DIAGNOSIS — Z8551 Personal history of malignant neoplasm of bladder: Secondary | ICD-10-CM | POA: Diagnosis not present

## 2017-10-08 ENCOUNTER — Ambulatory Visit: Payer: Medicare Other | Admitting: Podiatry

## 2017-10-08 DIAGNOSIS — M79675 Pain in left toe(s): Secondary | ICD-10-CM | POA: Diagnosis not present

## 2017-10-08 DIAGNOSIS — M79674 Pain in right toe(s): Secondary | ICD-10-CM

## 2017-10-08 DIAGNOSIS — B351 Tinea unguium: Secondary | ICD-10-CM | POA: Diagnosis not present

## 2017-10-08 NOTE — Progress Notes (Signed)
Subjective: 84 year old male presents the office today for concerns of thick, discolored toenails he cannot trim himself he wants to discuss treatment options for nail fungus.  He states he has a topical antifungal asking for refill but he has not been using on a consistent basis.  He is asking to learn how to trim toenails as he cannot get to the toenails himself he is asking if there is any type of way he can better cut his nails. Denies any systemic complaints such as fevers, chills, nausea, vomiting. No acute changes since last appointment, and no other complaints at this time.   Objective: AAO x3, NAD DP/PT pulses palpable bilaterally, CRT less than 3 seconds Nails are hypertrophic, dystrophic, discolored, elongated x10.  There is no surrounding redness or drainage.  Mild tenderness palpation distal portion of the nails.  No surrounding redness or drainage or any signs of infection.  No open lesions or pre-ulcerative lesions.  No pain with calf compression, swelling, warmth, erythema  Assessment: Onychomycosis  Plan: -All treatment options discussed with the patient including all alternatives, risks, complications.  As a courtesy I debrided the nails x10 without any complications or bleeding.  We discussed treatment options.  Continue topical antifungal and I will reorder this for him.  I do not want to try to trim his toenails himself as he cannot reach his toes I do not want him to cut himself. We can do this every 61days if he needs to. -Patient encouraged to call the office with any questions, concerns, change in symptoms.   Trula Slade DPM

## 2017-10-15 DIAGNOSIS — D229 Melanocytic nevi, unspecified: Secondary | ICD-10-CM | POA: Diagnosis not present

## 2017-10-15 DIAGNOSIS — L814 Other melanin hyperpigmentation: Secondary | ICD-10-CM | POA: Diagnosis not present

## 2017-10-15 DIAGNOSIS — L821 Other seborrheic keratosis: Secondary | ICD-10-CM | POA: Diagnosis not present

## 2017-10-15 DIAGNOSIS — C44329 Squamous cell carcinoma of skin of other parts of face: Secondary | ICD-10-CM | POA: Diagnosis not present

## 2017-10-15 DIAGNOSIS — D485 Neoplasm of uncertain behavior of skin: Secondary | ICD-10-CM | POA: Diagnosis not present

## 2017-11-08 DIAGNOSIS — C44329 Squamous cell carcinoma of skin of other parts of face: Secondary | ICD-10-CM | POA: Diagnosis not present

## 2017-11-16 ENCOUNTER — Other Ambulatory Visit: Payer: Self-pay | Admitting: Neurology

## 2017-11-18 ENCOUNTER — Ambulatory Visit: Payer: Medicare Other | Admitting: Internal Medicine

## 2017-11-18 ENCOUNTER — Encounter: Payer: Self-pay | Admitting: Internal Medicine

## 2017-11-18 VITALS — BP 127/64 | HR 87 | Temp 97.7°F | Resp 16 | Ht 68.0 in | Wt 150.0 lb

## 2017-11-18 DIAGNOSIS — H9193 Unspecified hearing loss, bilateral: Secondary | ICD-10-CM

## 2017-11-18 DIAGNOSIS — I10 Essential (primary) hypertension: Secondary | ICD-10-CM

## 2017-11-18 DIAGNOSIS — F329 Major depressive disorder, single episode, unspecified: Secondary | ICD-10-CM

## 2017-11-18 DIAGNOSIS — M150 Primary generalized (osteo)arthritis: Secondary | ICD-10-CM | POA: Diagnosis not present

## 2017-11-18 DIAGNOSIS — F32A Depression, unspecified: Secondary | ICD-10-CM

## 2017-11-18 DIAGNOSIS — M159 Polyosteoarthritis, unspecified: Secondary | ICD-10-CM

## 2017-11-18 LAB — BASIC METABOLIC PANEL
BUN: 27 mg/dL — AB (ref 6–23)
CALCIUM: 9.4 mg/dL (ref 8.4–10.5)
CO2: 25 mEq/L (ref 19–32)
CREATININE: 0.89 mg/dL (ref 0.40–1.50)
Chloride: 104 mEq/L (ref 96–112)
GFR: 87.06 mL/min (ref >60.00)
GLUCOSE: 94 mg/dL (ref 70–99)
Potassium: 4 mEq/L (ref 3.5–5.1)
Sodium: 136 mEq/L (ref 135–145)

## 2017-11-18 LAB — CBC
HCT: 39.3 % (ref 39.0–52.0)
Hemoglobin: 13.1 g/dL (ref 13.0–17.0)
MCHC: 33.2 g/dL (ref 30.0–36.0)
MCV: 94.9 fl (ref 78.0–100.0)
Platelets: 254 10*3/uL (ref 150.0–400.0)
RBC: 4.15 Mil/uL — ABNORMAL LOW (ref 4.22–5.81)
RDW: 14 % (ref 11.5–15.5)
WBC: 6.1 10*3/uL (ref 4.0–10.5)

## 2017-11-18 NOTE — Patient Instructions (Signed)
GO TO THE LAB : Get the blood work     GO TO THE FRONT DESK Schedule your next appointment for a   checkup in 3 months  Please consider see Coralyn Mark, one of our counselor  For your knee: Get a brace Tylenol as needed Okay to take occasional Advil Call if you are interested on a referral Call if you get worse

## 2017-11-18 NOTE — Progress Notes (Signed)
Subjective:    Patient ID: [name], male    DOB: [birth year], 83 y.o.   MRN: [id number]  DOS:  11/18/2017 Type of visit - description : f/u Interval history: DJD: Complained of knee pain, this is been a chronic issue bilaterally but the left knee is worse for the last few weeks.  Takes Tylenol daily and occasional Advil. Also again reports HOH, a feeling of fullness at the ears. Parkinson, dementia: Note from neurology reviewed HTN: Controlled, due for labs  Review of Systems Denies fever chills. Did have a couple of falls since the last visit, reports no major injuries specifically no knee injuries. Denies tinnitus.  Past Medical History:  Diagnosis Date  . Allergic rhinitis   . Anxiety   . Bladder cancer [facility])    urologist-  dr eskridge--  low grade TA  . BPH with elevated PSA   . Complication of anesthesia    "paranoid after back surgery in october lasted 2 to 3 days"  . DDD (degenerative disc disease), lumbar   . Depression   . Dry mouth    USES BIOTIN SPRAY/ MOUTHWASH  . Frequency of urination   . GERD (gastroesophageal reflux disease)   . Hearing loss    does not wear his hearing aid  . History of kidney stones    10/1989  . Hyperlipidemia   . Hypertension   . Mild obstructive sleep apnea    PER PT  MILD OSA , NO CPAP RX,  STUDY DONE 2009  . Nocturia   . OAB (overactive bladder)   . Osteoarthritis    KNEES, SHOULDER  . Parkinson disease ([place]) DX   2005   NEUROLOGIST-   DR TAT--  IDIOPATHIC PARKINSON'S /  TREMORS CONTROLLED WITH MEDS  . Urge urinary incontinence     Past Surgical History:  Procedure Laterality Date  . CATARACT EXTRACTION W/ INTRAOCULAR LENS  IMPLANT, BILATERAL  2014  . CYSTOSCOPY Bilateral 04/09/2017   Procedure: CYSTOSCOPY/ RETROGRADE;  Surgeon: Festus Aloe, MD;  Location: [facility];  Service: Urology;  Laterality: Bilateral;  . CYSTOSCOPY W/ RETROGRADES Bilateral 12/14/2014   Procedure: CYSTOSCOPY BLADDER BIOPSY FULGERATION  MITOMYCIN C BILATERAL RETROGRADE PYELOGRAM;  Surgeon: Festus Aloe, MD;  Location: [facility];  Service: Urology;  Laterality: Bilateral;  . CYSTOSCOPY WITH STENT PLACEMENT Left 12/19/2013   Procedure: CYSTOSCOPY BLADDER BX, LEFT URETERAL STENT PLACEMENT , LEFT RETROGRADE AND PYLOGRAM;  Surgeon: Festus Aloe, MD;  Location: [facility];  Service: Urology;  Laterality: Left;  . EYE SURGERY Bilateral    ioc for cataract  . HERNIA REPAIR  2013   inguinal  . INGUINAL HERNIA REPAIR  03/09/2012   Procedure: HERNIA REPAIR INGUINAL ADULT;  Surgeon: Adin Hector, MD;  Location: [facility];  Service: General;  Laterality: Right;  . INSERTION OF MESH  03/09/2012   Procedure: INSERTION OF MESH;  Surgeon: Adin Hector, MD;  Location: [facility];  Service: General;  Laterality: Right;  . LUMBAR LAMINECTOMY/DECOMPRESSION MICRODISCECTOMY N/A 02/05/2017   Procedure: Laminectomy and Foraminotomy - Lumbar One-Two,Lumbar Two-Three, Lumbar Three-Four.;  Surgeon: Eustace Moore, MD;  Location: [facility];  Service: Neurosurgery;  Laterality: N/A;  . NASAL SINUS SURGERY  1982  . ORIF RIGHT ARM FX  1949   HARDWARE REMOVED   . REMOVAL BENIGN CYST LEFT FOREHEAD  1969  . TRANSURETHRAL RESECTION OF BLADDER TUMOR N/A 12/19/2013   Procedure:  TRANSURETHRAL RESECTION OF BLADDER TUMOR WITH GYRUS (TURBT-GYRUS);  Surgeon:  Festus Aloe, MD;  Location: [facility];  Service: Urology;  Laterality: N/A;  . TRANSURETHRAL RESECTION OF BLADDER TUMOR N/A 04/09/2017   Procedure: TRANSURETHRAL RESECTION OF BLADDER TUMOR / (TURBT) 2-5cm;  Surgeon: Festus Aloe, MD;  Location: [facility];  Service: Urology;  Laterality: N/A;  ONLY NEEDS 90 MIN FOR BOTH PROCEDURES    Social History   Socioeconomic History  . Marital status: Married    Spouse name: [name]  . Number of children: 1  . Years of education: Not on file  . Highest education level: Not on file  Occupational History  .  Occupation: retired 1995, worked in [place] , finances   Social Needs  . Financial resource strain: Not on file  . Food insecurity:    Worry: Not on file    Inability: Not on file  . Transportation needs:    Medical: Not on file    Non-medical: Not on file  Tobacco Use  . Smoking status: Former Smoker    Packs/day: 1.00    Years: 15.00    Pack years: 15.00    Types: Cigarettes    Last attempt to quit: 04/20/1968    Years since quitting: 49.6  . Smokeless tobacco: Never Used  Substance and Sexual Activity  . Alcohol use: Yes    Alcohol/week: 4.2 oz    Types: 7 Glasses of wine per week    Comment: ONE WINE PER DAY  . Drug use: No  . Sexual activity: Not Currently  Lifestyle  . Physical activity:    Days per week: Not on file    Minutes per session: Not on file  . Stress: Not on file  Relationships  . Social connections:    Talks on phone: Not on file    Gets together: Not on file    Attends religious service: Not on file    Active member of club or organization: Not on file    Attends meetings of clubs or organizations: Not on file    Relationship status: Not on file  . Intimate partner violence:    Fear of current or ex partner: Not on file    Emotionally abused: Not on file    Physically abused: Not on file    Forced sexual activity: Not on file  Other Topics Concern  . Not on file  Social History Narrative   Lives w/ wife   Has a Daughter 81 y/o      Allergies as of 11/18/2017      Reactions   Other Other (See Comments)   Paranoia from anesthesia drug last visit-unknown drug       Medication List        Accurate as of 11/18/17 11:59 PM. Always use your most recent med list.          acetaminophen 500 MG tablet Commonly known as:  TYLENOL Take 500-1,000 mg by mouth See admin instructions. 1000 mg in the morning, 500 mg at noon, 500 mg at 1600, and 1000 mg at night   AMBULATORY NON FORMULARY MEDICATION 1 Units by Other route daily. Lift chair     carbidopa-levodopa 50-200 MG tablet Commonly known as:  SINEMET CR TAKE 1 TABLET BY MOUTH EVERYDAY AT BEDTIME   carbidopa-levodopa 25-100 MG tablet Commonly known as:  SINEMET IR TAKE TWO TABLETS BY MOUTH FOUR TIMES DAILY   docusate sodium 50 MG capsule Commonly known as:  COLACE Take 50-100 mg by mouth See admin instructions. Take 100 mg  in the morning and 50 mg at 4pm   fexofenadine 180 MG tablet Commonly known as:  ALLEGRA Take 180 mg by mouth daily. At 1600   FLUoxetine 20 MG capsule Commonly known as:  PROZAC Take 2 capsules (40 mg total) by mouth daily.   fluticasone 50 MCG/ACT nasal spray Commonly known as:  FLONASE Place 2 sprays daily into both nostrils.   ibuprofen 200 MG tablet Commonly known as:  ADVIL,MOTRIN Take 400 mg by mouth every 8 (eight) hours as needed.   lidocaine 5 % ointment Commonly known as:  XYLOCAINE Apply 1 application topically 3 (three) times daily as needed for mild pain.   losartan 100 MG tablet Commonly known as:  COZAAR Take 1 tablet (100 mg total) by mouth daily at 12 noon.   MYRBETRIQ 25 MG Tb24 tablet Generic drug:  mirabegron ER Take 50 mg by mouth daily. At [place]  Onychomycosis Nail Lacquer -  Fluconazole 2%, Terbinafine 1% DMSO Apply to affected nail once daily Qty. 120 gm 3 refills   NONFORMULARY OR COMPOUNDED ITEM [facility]:  Urea 40%, apply daily to affected areas.   polyethylene glycol packet Commonly known as:  MIRALAX / GLYCOLAX Take 17 g by mouth daily as needed for mild constipation.   PROBIOTIC DAILY PO Take 1 capsule by mouth daily.   REFRESH OP Place 1-2 drops into both eyes daily as needed (dry eyes).   ROLAIDS PO Take 2 tablets by mouth as needed (indigestion).   tamsulosin 0.4 MG Caps capsule Commonly known as:  FLOMAX Take 0.4 mg by mouth daily after supper.          Objective:   Physical Exam BP 127/64 (BP Location: Left Arm, Patient Position:  Sitting, Cuff Size: Small)   Pulse 87   Temp 97.7 F (36.5 C) (Oral)   Resp 16   Ht 5\' 8"  (1.727 m)   Wt 150 lb (68 kg)   SpO2 99%   BMI 22.81 kg/m  General:   Well developed, NAD, see BMI.  HEENT:  Normocephalic . Face symmetric, atraumatic. Ears: Mild amount of wax bilaterally Lungs:  CTA B Normal respiratory effort, no intercostal retractions, no accessory muscle use. Heart: RRR,  no murmur.  No pretibial edema bilaterally   Skin: Not pale. Not jaundice MSK: Needs are symmetric, both with bony changes consistent with DJD.  No effusion, range of motion normal Neurologic:  alert & oriented X3.  Speech normal, gait consistent with Parkinson's, somewhat limited by DJD.  Uses a cane Psych--  Cognition and judgment appear intact.  Cooperative with normal attention span and concentration.  Behavior appropriate. apprehensive but no depressed appearing     Assessment & Plan:   Assessment HTN Hyperlipidemia Depression - anxiety: Lexapro intolerant 10-2016 OSA, mild per remote sleep study (~2008?), no CPAP Parkinson disease Dr. Carles Collet Dementia, mild GU: Dr. Junious Silk --BPH, elevated PSA --Bladder cancer-low-grade --OAB -- E.D. Dysphagia, MBE 09-2013 normal Dry mouth  DJD- back pain, sees Dr Nelva Bush, s/p local injections, on prn hydrocodone, surgery 2018 L > R LE edema: [place] (-) DVT 04-2016 HOH   PLAN: HTN: On losartan, check a BMP.  Also last hemoglobin slightly low, check a CBC. Depression anxiety: On fluoxetine, we again had a conversation about his sxs,  he feels is very pessimistic.  He is counseled and recommended formal counseling with Coralyn Mark. DJD: Left knee pain worse than usual, consistent with DJD, no effusion or warmness.  Offered a  sports medicine referral and will think about it.  In the meantime recommend knee support, Tylenol, Advil sporadically, ice. Parkinson, mild dementia: Saw neurology 09/08/2017.  Was recommended a transport chair, use a walker, he was somewhat  resistant to advice.  No problems reported with dysphagia.  They added melatonin.  Dementia was a slightly worsened over time per report. HOH: Denies tinnitus, we agreed to refer to Dr. Elwyn Reach. RTC 3 months   Today, I spent more than  25  min with the patient: >50% of the time counseling regards anxiety, depression, providing listening therapy, also reviewing the chart

## 2017-11-19 NOTE — Assessment & Plan Note (Signed)
HTN: On losartan, check a BMP.  Also last hemoglobin slightly low, check a CBC. Depression anxiety: On fluoxetine, we again had a conversation about his sxs,  he feels is very pessimistic.  He is counseled and recommended formal counseling with Coralyn Mark. DJD: Left knee pain worse than usual, consistent with DJD, no effusion or warmness.  Offered a sports medicine referral and will think about it.  In the meantime recommend knee support, Tylenol, Advil sporadically, ice. Parkinson, mild dementia: Saw neurology 09/08/2017.  Was recommended a transport chair, use a walker, he was somewhat resistant to advice.  No problems reported with dysphagia.  They added melatonin.  Dementia was a slightly worsened over time per report. HOH: Denies tinnitus, we agreed to refer to Dr. Elwyn Reach. RTC 3 months

## 2017-11-30 ENCOUNTER — Encounter: Payer: Self-pay | Admitting: Internal Medicine

## 2017-11-30 DIAGNOSIS — H9190 Unspecified hearing loss, unspecified ear: Secondary | ICD-10-CM

## 2017-11-30 NOTE — Telephone Encounter (Signed)
Please advise in absence of pcp 

## 2017-12-01 NOTE — Addendum Note (Signed)
Addended by: Debbrah Alar on: 12/01/2017 05:30 PM   Modules accepted: Orders

## 2017-12-02 DIAGNOSIS — R3915 Urgency of urination: Secondary | ICD-10-CM | POA: Diagnosis not present

## 2017-12-02 DIAGNOSIS — R350 Frequency of micturition: Secondary | ICD-10-CM | POA: Diagnosis not present

## 2017-12-09 DIAGNOSIS — R350 Frequency of micturition: Secondary | ICD-10-CM | POA: Diagnosis not present

## 2017-12-14 ENCOUNTER — Ambulatory Visit: Payer: Medicare Other | Admitting: Podiatry

## 2017-12-14 ENCOUNTER — Other Ambulatory Visit: Payer: Self-pay | Admitting: Internal Medicine

## 2017-12-14 DIAGNOSIS — M79674 Pain in right toe(s): Secondary | ICD-10-CM

## 2017-12-14 DIAGNOSIS — B351 Tinea unguium: Secondary | ICD-10-CM | POA: Diagnosis not present

## 2017-12-14 DIAGNOSIS — M79675 Pain in left toe(s): Secondary | ICD-10-CM

## 2017-12-15 DIAGNOSIS — H903 Sensorineural hearing loss, bilateral: Secondary | ICD-10-CM | POA: Diagnosis not present

## 2017-12-15 DIAGNOSIS — R3915 Urgency of urination: Secondary | ICD-10-CM | POA: Diagnosis not present

## 2017-12-15 DIAGNOSIS — R350 Frequency of micturition: Secondary | ICD-10-CM | POA: Diagnosis not present

## 2017-12-15 DIAGNOSIS — H9113 Presbycusis, bilateral: Secondary | ICD-10-CM | POA: Diagnosis not present

## 2017-12-15 DIAGNOSIS — H938X3 Other specified disorders of ear, bilateral: Secondary | ICD-10-CM | POA: Diagnosis not present

## 2017-12-15 NOTE — Progress Notes (Signed)
Subjective: 84 year old male presents the office today for concerns of thick, discolored toenails he cannot trim himself. He is still using the topical antifungal. He is not sure if he has been seeing much improvement. Denies any systemic complaints such as fevers, chills, nausea, vomiting. No acute changes since last appointment, and no other complaints at this time.   Objective: AAO x3, NAD DP/PT pulses palpable bilaterally, CRT less than 3 seconds Nails are hypertrophic, dystrophic, discolored, elongated x10.  There is no surrounding redness or drainage.  Mild tenderness palpation distal portion of the nails.  No surrounding redness or drainage or any signs of infection.  No open lesions or pre-ulcerative lesions.  No pain with calf compression, swelling, warmth, erythema  Assessment: Onychomycosis  Plan: -All treatment options discussed with the patient including all alternatives, risks, complications.  As a courtesy I debrided the nails x10 without any complications or bleeding.  Discussed time frame for use of the topical medication and we discussed success rates. He is asking about the hardness of his nails and what he can do we we discussed coconut oil -He wants to come every 4 weeks for nail trim. He is aware insurance is not going to cover it each visit most likely.  -Patient encouraged to call the office with any questions, concerns, change in symptoms.   Trula Slade DPM

## 2017-12-23 DIAGNOSIS — R3915 Urgency of urination: Secondary | ICD-10-CM | POA: Diagnosis not present

## 2017-12-23 DIAGNOSIS — R350 Frequency of micturition: Secondary | ICD-10-CM | POA: Diagnosis not present

## 2017-12-30 DIAGNOSIS — R350 Frequency of micturition: Secondary | ICD-10-CM | POA: Diagnosis not present

## 2017-12-30 DIAGNOSIS — R3915 Urgency of urination: Secondary | ICD-10-CM | POA: Diagnosis not present

## 2018-01-06 DIAGNOSIS — R350 Frequency of micturition: Secondary | ICD-10-CM | POA: Diagnosis not present

## 2018-01-06 DIAGNOSIS — R3915 Urgency of urination: Secondary | ICD-10-CM | POA: Diagnosis not present

## 2018-01-13 DIAGNOSIS — R3915 Urgency of urination: Secondary | ICD-10-CM | POA: Diagnosis not present

## 2018-01-13 DIAGNOSIS — R350 Frequency of micturition: Secondary | ICD-10-CM | POA: Diagnosis not present

## 2018-01-15 ENCOUNTER — Other Ambulatory Visit: Payer: Self-pay | Admitting: Neurology

## 2018-01-20 DIAGNOSIS — R350 Frequency of micturition: Secondary | ICD-10-CM | POA: Diagnosis not present

## 2018-01-20 DIAGNOSIS — R3915 Urgency of urination: Secondary | ICD-10-CM | POA: Diagnosis not present

## 2018-01-27 DIAGNOSIS — R350 Frequency of micturition: Secondary | ICD-10-CM | POA: Diagnosis not present

## 2018-01-27 DIAGNOSIS — R3915 Urgency of urination: Secondary | ICD-10-CM | POA: Diagnosis not present

## 2018-02-03 DIAGNOSIS — R350 Frequency of micturition: Secondary | ICD-10-CM | POA: Diagnosis not present

## 2018-02-03 DIAGNOSIS — R3915 Urgency of urination: Secondary | ICD-10-CM | POA: Diagnosis not present

## 2018-02-04 NOTE — Progress Notes (Signed)
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 decremation with RAM's, with any form of RAMS, including alternating supination and pronation of the forearm, hand opening and closing, finger taps, heel taps and toe taps. Gait and Station: The patient states that he cannot get out of the chair without pushing off of it (does not drive).  He pushes off of the arms and gets out of the chair.  He holds his cane but does not use it.  He is stooped.  Stride length is good, but clearly purposeful.     ASSESSMENT/PLAN:  1.  Idiopathic Parkinson's disease  -He will continue taking carbidopa/levodopa 25/100, 2 tablets at 8 AM/noon/4 PM and 8 PM.  He is also taking carbidopa/levodopa 50/200 at bedtime, which is also about 8 PM to 9 PM   -talked about walker at all times.  He is very resistant.  Already has a walker at home but doesn't use.  -long discussion again about the barriers he provides to his own health care.  He has many excuses why he  can't go to counseling or cannot exercise, etc.  -we will order PT. he was willing to do this in the home after some discussion. 2.  Dysphagia.  -MBE on 09/29/13 was normal and no problems currently 3.   Depression, confirmed by neuropsych testing and insomnia  -I thought he did better with mirtazapine, which has been discontinued.  He is now on Prozac.  He refuses counseling. 4.  PDD  -He underwent neuropsych testing on 08/06/2015.  This confirmed mild dementia.    -this has worsened with time.  -He does have full-time caregiving. 5.  Urinary incontinence  -seeing urology.  6.  Low back pain  -He had surgery in October, 2018.  He is doing better but still says that this is why he cannot exercise along with fact he cannot find time to exercise 7.  F/u 5 months.  Much greater than 50% of this visit was spent in counseling and coordinating care.  Total face to face time:  30 min

## 2018-02-08 ENCOUNTER — Encounter: Payer: Self-pay | Admitting: Neurology

## 2018-02-08 ENCOUNTER — Ambulatory Visit: Payer: Medicare Other | Admitting: Neurology

## 2018-02-08 VITALS — BP 118/64 | HR 50 | Ht 68.0 in | Wt 149.4 lb

## 2018-02-08 DIAGNOSIS — G20 Parkinson's disease: Secondary | ICD-10-CM

## 2018-02-08 DIAGNOSIS — F0280 Dementia in other diseases classified elsewhere without behavioral disturbance: Secondary | ICD-10-CM

## 2018-02-08 DIAGNOSIS — F330 Major depressive disorder, recurrent, mild: Secondary | ICD-10-CM

## 2018-02-08 NOTE — Patient Instructions (Signed)
We will send Physical Therapy to the house.  They will call you to schedule.  If you don't hear from them in the next week, you can call me.

## 2018-02-09 ENCOUNTER — Telehealth: Payer: Self-pay | Admitting: Neurology

## 2018-02-09 ENCOUNTER — Other Ambulatory Visit: Payer: Self-pay | Admitting: *Deleted

## 2018-02-09 DIAGNOSIS — G20 Parkinson's disease: Secondary | ICD-10-CM

## 2018-02-09 DIAGNOSIS — G8929 Other chronic pain: Secondary | ICD-10-CM

## 2018-02-09 DIAGNOSIS — M545 Low back pain: Secondary | ICD-10-CM

## 2018-02-09 DIAGNOSIS — F411 Generalized anxiety disorder: Secondary | ICD-10-CM

## 2018-02-09 NOTE — Telephone Encounter (Signed)
Lennette Bihari called regarding this patient and a delay in therapy until next Tuesday 02/15/18. Thanks

## 2018-02-10 DIAGNOSIS — R3915 Urgency of urination: Secondary | ICD-10-CM | POA: Diagnosis not present

## 2018-02-10 DIAGNOSIS — R350 Frequency of micturition: Secondary | ICD-10-CM | POA: Diagnosis not present

## 2018-02-10 NOTE — Telephone Encounter (Signed)
Noted  

## 2018-02-13 ENCOUNTER — Other Ambulatory Visit: Payer: Self-pay | Admitting: Internal Medicine

## 2018-02-15 ENCOUNTER — Telehealth: Payer: Self-pay | Admitting: Neurology

## 2018-02-15 ENCOUNTER — Ambulatory Visit: Payer: Medicare Other | Admitting: Podiatry

## 2018-02-15 DIAGNOSIS — R32 Unspecified urinary incontinence: Secondary | ICD-10-CM | POA: Diagnosis not present

## 2018-02-15 DIAGNOSIS — F419 Anxiety disorder, unspecified: Secondary | ICD-10-CM | POA: Diagnosis not present

## 2018-02-15 DIAGNOSIS — M5136 Other intervertebral disc degeneration, lumbar region: Secondary | ICD-10-CM | POA: Diagnosis not present

## 2018-02-15 DIAGNOSIS — M170 Bilateral primary osteoarthritis of knee: Secondary | ICD-10-CM | POA: Diagnosis not present

## 2018-02-15 DIAGNOSIS — G20 Parkinson's disease: Secondary | ICD-10-CM | POA: Diagnosis not present

## 2018-02-15 DIAGNOSIS — F329 Major depressive disorder, single episode, unspecified: Secondary | ICD-10-CM | POA: Diagnosis not present

## 2018-02-15 DIAGNOSIS — R1310 Dysphagia, unspecified: Secondary | ICD-10-CM | POA: Diagnosis not present

## 2018-02-15 DIAGNOSIS — F0280 Dementia in other diseases classified elsewhere without behavioral disturbance: Secondary | ICD-10-CM | POA: Diagnosis not present

## 2018-02-15 DIAGNOSIS — I10 Essential (primary) hypertension: Secondary | ICD-10-CM | POA: Diagnosis not present

## 2018-02-15 DIAGNOSIS — N3281 Overactive bladder: Secondary | ICD-10-CM | POA: Diagnosis not present

## 2018-02-15 NOTE — Telephone Encounter (Signed)
[facility] PT called and was given verbal order to see patient 2x week for 5 weeks and add OT evaluation.

## 2018-02-21 ENCOUNTER — Encounter: Payer: Self-pay | Admitting: Podiatry

## 2018-02-21 ENCOUNTER — Ambulatory Visit (INDEPENDENT_AMBULATORY_CARE_PROVIDER_SITE_OTHER): Payer: Medicare Other | Admitting: Podiatry

## 2018-02-21 DIAGNOSIS — M79674 Pain in right toe(s): Secondary | ICD-10-CM

## 2018-02-21 DIAGNOSIS — B351 Tinea unguium: Secondary | ICD-10-CM | POA: Diagnosis not present

## 2018-02-21 DIAGNOSIS — M79675 Pain in left toe(s): Secondary | ICD-10-CM

## 2018-02-22 ENCOUNTER — Ambulatory Visit: Payer: Self-pay | Admitting: Internal Medicine

## 2018-02-23 NOTE — Progress Notes (Signed)
Subjective: 84 year old male presents the office today for concerns of thick, discolored toenails he cannot trim himself. He is still using the topical antifungal.  He is still using a compound cream from [employer]. Denies any systemic complaints such as fevers, chills, nausea, vomiting. No acute changes since last appointment, and no other complaints at this time.   Objective: AAO x3, NAD DP/PT pulses palpable bilaterally, CRT less than 3 seconds Nails are hypertrophic, dystrophic, discolored, elongated x10.  There is no surrounding redness or drainage.  Mild tenderness palpation distal portion of the nails.  No surrounding redness or drainage or any signs of infection.  No open lesions or pre-ulcerative lesions.  No pain with calf compression, swelling, warmth, erythema  Assessment: Onychomycosis  Plan: -All treatment options discussed with the patient including all alternatives, risks, complications.  As a courtesy I debrided the nails x10 without any complications or bleeding.  We discussed alternative medications for nail fungus after discussions continue with the current treatment.   -Follow-up as scheduled for routine care sooner if needed. -Patient encouraged to call the office with any questions, concerns, change in symptoms.   Trula Slade DPM

## 2018-02-24 DIAGNOSIS — R3915 Urgency of urination: Secondary | ICD-10-CM | POA: Diagnosis not present

## 2018-02-24 DIAGNOSIS — R350 Frequency of micturition: Secondary | ICD-10-CM | POA: Diagnosis not present

## 2018-03-01 ENCOUNTER — Ambulatory Visit: Payer: Medicare Other | Admitting: Internal Medicine

## 2018-03-01 ENCOUNTER — Encounter: Payer: Self-pay | Admitting: Internal Medicine

## 2018-03-01 VITALS — BP 136/62 | HR 63 | Temp 98.3°F | Resp 16 | Ht 68.0 in | Wt 150.1 lb

## 2018-03-01 DIAGNOSIS — Z23 Encounter for immunization: Secondary | ICD-10-CM

## 2018-03-01 DIAGNOSIS — F329 Major depressive disorder, single episode, unspecified: Secondary | ICD-10-CM | POA: Diagnosis not present

## 2018-03-01 DIAGNOSIS — H9193 Unspecified hearing loss, bilateral: Secondary | ICD-10-CM

## 2018-03-01 DIAGNOSIS — F32A Depression, unspecified: Secondary | ICD-10-CM

## 2018-03-01 DIAGNOSIS — I10 Essential (primary) hypertension: Secondary | ICD-10-CM

## 2018-03-01 DIAGNOSIS — E785 Hyperlipidemia, unspecified: Secondary | ICD-10-CM

## 2018-03-01 DIAGNOSIS — G20 Parkinson's disease: Secondary | ICD-10-CM

## 2018-03-01 NOTE — Patient Instructions (Signed)
   GO TO THE FRONT DESK Schedule your next appointment for a  Physical exam in 4 months , fasting  Use your walker consistently

## 2018-03-01 NOTE — Progress Notes (Signed)
Pre visit review using our clinic review tool, if applicable. No additional management support is needed unless otherwise documented below in the visit note. 

## 2018-03-01 NOTE — Progress Notes (Signed)
Subjective:    Patient ID: [name], male    DOB: October 28, 1933, 84 y.o.   MRN: [id number]  DOS:  03/01/2018 Type of visit - description : 3 month f/u, here with his wife. Interval history: In general feeling about the same. Note from neurology reviewed. Note from ENT reviewed. As far as depression, he has not been able to set up a visit with counseling. He is getting treatment for bladder incontinence. DJD: Uses a knee brace as needed.    Review of Systems   Past Medical History:  Diagnosis Date  . Allergic rhinitis   . Anxiety   . Bladder cancer [facility])    urologist-  dr eskridge--  low grade TA  . BPH with elevated PSA   . Complication of anesthesia    "paranoid after back surgery in october lasted 2 to 3 days"  . DDD (degenerative disc disease), lumbar   . Depression   . Dry mouth    USES BIOTIN SPRAY/ MOUTHWASH  . Frequency of urination   . GERD (gastroesophageal reflux disease)   . Hearing loss    does not wear his hearing aid  . History of kidney stones    10/1989  . Hyperlipidemia   . Hypertension   . Mild obstructive sleep apnea    PER PT  MILD OSA , NO CPAP RX,  STUDY DONE 2009  . Nocturia   . OAB (overactive bladder)   . Osteoarthritis    KNEES, SHOULDER  . Parkinson disease ([place]) DX   2005   NEUROLOGIST-   DR TAT--  IDIOPATHIC PARKINSON'S /  TREMORS CONTROLLED WITH MEDS  . Urge urinary incontinence     Past Surgical History:  Procedure Laterality Date  . CATARACT EXTRACTION W/ INTRAOCULAR LENS  IMPLANT, BILATERAL  2014  . CYSTOSCOPY Bilateral 04/09/2017   Procedure: CYSTOSCOPY/ RETROGRADE;  Surgeon: Festus Aloe, MD;  Location: [facility];  Service: Urology;  Laterality: Bilateral;  . CYSTOSCOPY W/ RETROGRADES Bilateral 12/14/2014   Procedure: CYSTOSCOPY BLADDER BIOPSY FULGERATION MITOMYCIN C BILATERAL RETROGRADE PYELOGRAM;  Surgeon: Festus Aloe, MD;  Location[facility];  Service: Urology;  Laterality: Bilateral;  .  CYSTOSCOPY WITH STENT PLACEMENT Left 12/19/2013   Procedure: CYSTOSCOPY BLADDER BX, LEFT URETERAL STENT PLACEMENT , LEFT RETROGRADE AND PYLOGRAM;  Surgeon: Festus Aloe, MD;  Location: [facility];  Service: Urology;  Laterality: Left;  . EYE SURGERY Bilateral    ioc for cataract  . HERNIA REPAIR  2013   inguinal  . INGUINAL HERNIA REPAIR  03/09/2012   Procedure: HERNIA REPAIR INGUINAL ADULT;  Surgeon: Adin Hector, MD;  Location: [facility];  Service: General;  Laterality: Right;  . INSERTION OF MESH  03/09/2012   Procedure: INSERTION OF MESH;  Surgeon: Adin Hector, MD;  Location: [facility];  Service: General;  Laterality: Right;  . LUMBAR LAMINECTOMY/DECOMPRESSION MICRODISCECTOMY N/A 02/05/2017   Procedure: Laminectomy and Foraminotomy - Lumbar One-Two,Lumbar Two-Three, Lumbar Three-Four.;  Surgeon: Eustace Moore, MD;  Location: [facility];  Service: Neurosurgery;  Laterality: N/A;  . NASAL SINUS SURGERY  1982  . ORIF RIGHT ARM FX  1949   HARDWARE REMOVED   . REMOVAL BENIGN CYST LEFT FOREHEAD  1969  . TRANSURETHRAL RESECTION OF BLADDER TUMOR N/A 12/19/2013   Procedure:  TRANSURETHRAL RESECTION OF BLADDER TUMOR WITH GYRUS (TURBT-GYRUS);  Surgeon: Festus Aloe, MD;  Location: [facility];  Service: Urology;  Laterality: N/A;  . TRANSURETHRAL RESECTION OF BLADDER TUMOR  N/A 04/09/2017   Procedure: TRANSURETHRAL RESECTION OF BLADDER TUMOR / (TURBT) 2-5cm;  Surgeon: Festus Aloe, MD;  Location: [facility];  Service: Urology;  Laterality: N/A;  ONLY NEEDS 90 MIN FOR BOTH PROCEDURES    Social History   Socioeconomic History  . Marital status: Married    Spouse name: [name]  . Number of children: 1  . Years of education: Not on file  . Highest education level: Not on file  Occupational History  . Occupation: retired 1995, worked in [place] , finances   Social Needs  . Financial resource strain: Not on file  . Food insecurity:    Worry: Not on file     Inability: Not on file  . Transportation needs:    Medical: Not on file    Non-medical: Not on file  Tobacco Use  . Smoking status: Former Smoker    Packs/day: 1.00    Years: 15.00    Pack years: 15.00    Types: Cigarettes    Last attempt to quit: 04/20/1968    Years since quitting: 49.8  . Smokeless tobacco: Never Used  Substance and Sexual Activity  . Alcohol use: Yes    Alcohol/week: 7.0 standard drinks    Types: 7 Glasses of wine per week    Comment: ONE WINE PER DAY  . Drug use: No  . Sexual activity: Not Currently  Lifestyle  . Physical activity:    Days per week: Not on file    Minutes per session: Not on file  . Stress: Not on file  Relationships  . Social connections:    Talks on phone: Not on file    Gets together: Not on file    Attends religious service: Not on file    Active member of club or organization: Not on file    Attends meetings of clubs or organizations: Not on file    Relationship status: Not on file  . Intimate partner violence:    Fear of current or ex partner: Not on file    Emotionally abused: Not on file    Physically abused: Not on file    Forced sexual activity: Not on file  Other Topics Concern  . Not on file  Social History Narrative   Lives w/ wife   Has a Daughter 76 y/o      Allergies as of 03/01/2018      Reactions   Pollen Extract Other (See Comments)   Other Other (See Comments)   Paranoia from anesthesia drug last visit-unknown drug       Medication List        Accurate as of 03/01/18 11:59 PM. Always use your most recent med list.          acetaminophen 500 MG tablet Commonly known as:  TYLENOL Take 500-1,000 mg by mouth See admin instructions. 1000 mg in the morning, 500 mg at noon, 500 mg at 1600, and 1000 mg at night   AMBULATORY NON FORMULARY MEDICATION 1 Units by Other route daily. Lift chair   carbidopa-levodopa 25-100 MG tablet Commonly known as:  SINEMET IR TAKE TWO TABLETS BY MOUTH FOUR TIMES DAILY    carbidopa-levodopa 50-200 MG tablet Commonly known as:  SINEMET CR TAKE 1 TABLET BY MOUTH EVERYDAY AT BEDTIME   docusate sodium 50 MG capsule Commonly known as:  COLACE Take 50-100 mg by mouth See admin instructions. Take 100 mg in the morning and 50 mg at 4pm   fexofenadine 180 MG tablet Commonly  known as:  ALLEGRA Take 180 mg by mouth daily. At 1600   FLUoxetine 20 MG capsule Commonly known as:  PROZAC TAKE 2 CAPSULES BY MOUTH EVERY DAY   fluticasone 50 MCG/ACT nasal spray Commonly known as:  FLONASE Place 2 sprays daily into both nostrils.   ibuprofen 200 MG tablet Commonly known as:  ADVIL,MOTRIN Take 400 mg by mouth every 8 (eight) hours as needed.   lidocaine 5 % ointment Commonly known as:  XYLOCAINE Apply 1 application topically 3 (three) times daily as needed for mild pain.   losartan 100 MG tablet Commonly known as:  COZAAR Take 1 tablet (100 mg total) by mouth daily at 12 noon.   MYRBETRIQ 25 MG Tb24 tablet Generic drug:  mirabegron ER Take 50 mg by mouth daily. At [place]  Onychomycosis Nail Lacquer -  Fluconazole 2%, Terbinafine 1% DMSO Apply to affected nail once daily Qty. 120 gm 3 refills   NONFORMULARY OR COMPOUNDED ITEM [facility]:  Urea 40%, apply daily to affected areas.   polyethylene glycol packet Commonly known as:  MIRALAX / GLYCOLAX Take 17 g by mouth daily as needed for mild constipation.   PROBIOTIC DAILY PO Take 1 capsule by mouth daily.   REFRESH OP Place 1-2 drops into both eyes daily as needed (dry eyes).   ROLAIDS PO Take 2 tablets by mouth as needed (indigestion).   tamsulosin 0.4 MG Caps capsule Commonly known as:  FLOMAX Take 0.4 mg by mouth daily after supper.          Objective:   Physical Exam BP 136/62 (BP Location: Left Arm, Patient Position: Sitting, Cuff Size: Small)   Pulse 63   Temp 98.3 F (36.8 C) (Oral)   Resp 16   Ht 5\' 8"  (1.727 m)   Wt 150 lb 2 oz (68.1  kg)   SpO2 98%   BMI 22.83 kg/m  General:   Well developed, NAD, see BMI.  HEENT:  Normocephalic . Face symmetric, atraumatic. Lungs:  CTA B Normal respiratory effort, no intercostal retractions, no accessory muscle use. Heart: RRR,  no murmur.  No pretibial edema bilaterally   Skin: Not pale. Not jaundice Neurologic:  alert & oriented X3.  Speech normal, gait consistent with Parkinson's, somewhat limited by DJD.  Uses a cane Psych--  Cognition and judgment appear intact.  Cooperative with normal attention span and concentration.  Behavior appropriate. No anxious or depressed appearing     Assessment & Plan:   Assessment HTN Hyperlipidemia Depression - anxiety: Lexapro intolerant 10-2016 OSA, mild per remote sleep study (~2008?), no CPAP Parkinson disease Dr. Carles Collet Dementia, mild GU: Dr. Junious Silk --BPH, elevated PSA --Bladder cancer-low-grade --OAB, incontinence  -- E.D. Dysphagia, MBE 09-2013 normal Dry mouth  DJD- back pain, sees Dr Nelva Bush, s/p local injections, on prn hydrocodone, surgery 2018 L > R LE edema: [place] (-) DVT 04-2016 HOH   PLAN: HTN: Currently on losartan, last BMP satisfactory. Hyperlipidemia: Self DC Lipitor months ago, will recheck on RTC Depression, anxiety: Patient reports that he continued to be very anxious about having falls; not able so far to see a counselor and is not using his walker.  Today, he seems to be less anxious than before, we will continue with fluoxetine. HOH: Since the last visit, has seen ENT and diagnosed with presbycusis, cerumen impaction, was cleaned  Parkinson's disease, follow-up by neurology, last visit 02/08/2018, he was noted to be resistant to use a walker. Was recommended  PT. Fall prevention discussed.  Currently doing home PT.  Encouraged use of a walker consistently. Bladder incontinence, BPH, OAB: On Myrbetriq, Flomax.  Getting local injections at the ankle to stimulate his bladder at the urology office. Preventive  care: Flu shot today RTC CPX in few months fasting.   Today, I spent more than  25  min with the patient and his wife: >50% of the time counseling regards anxiety, providing listening therapy.  Also reviewing notes from other physicians, counseling ref risk and prevention of falls

## 2018-03-02 NOTE — Assessment & Plan Note (Signed)
HTN: Currently on losartan, last BMP satisfactory. Hyperlipidemia: Self DC Lipitor months ago, will recheck on RTC Depression, anxiety: Patient reports that he continued to be very anxious about having falls; not able so far to see a counselor and is not using his walker.  Today, he seems to be less anxious than before, we will continue with fluoxetine. HOH: Since the last visit, has seen ENT and diagnosed with presbycusis, cerumen impaction, was cleaned  Parkinson's disease, follow-up by neurology, last visit 02/08/2018, he was noted to be resistant to use a walker. Was recommended PT. Fall prevention discussed.  Currently doing home PT.  Encouraged use of a walker consistently. Bladder incontinence, BPH, OAB: On Myrbetriq, Flomax.  Getting local injections at the ankle to stimulate his bladder at the urology office. Preventive care: Flu shot today RTC CPX in few months fasting.

## 2018-03-21 ENCOUNTER — Telehealth: Payer: Self-pay | Admitting: Neurology

## 2018-03-21 NOTE — Telephone Encounter (Signed)
Spoke with Mliss Sax, PT with [facility] home health, and gave verbal orders to continue therapy 1x week for 4 weeks.

## 2018-03-24 DIAGNOSIS — R3915 Urgency of urination: Secondary | ICD-10-CM | POA: Diagnosis not present

## 2018-04-05 DIAGNOSIS — N401 Enlarged prostate with lower urinary tract symptoms: Secondary | ICD-10-CM | POA: Diagnosis not present

## 2018-04-05 DIAGNOSIS — Z8551 Personal history of malignant neoplasm of bladder: Secondary | ICD-10-CM | POA: Diagnosis not present

## 2018-04-05 DIAGNOSIS — R350 Frequency of micturition: Secondary | ICD-10-CM | POA: Diagnosis not present

## 2018-04-14 ENCOUNTER — Telehealth: Payer: Self-pay | Admitting: Neurology

## 2018-04-14 NOTE — Telephone Encounter (Signed)
FYI

## 2018-04-14 NOTE — Telephone Encounter (Signed)
Calling to let Dr. Carles Collet know that he has been discharged as of today for home health. Thanks

## 2018-04-15 DIAGNOSIS — R3915 Urgency of urination: Secondary | ICD-10-CM | POA: Diagnosis not present

## 2018-04-15 DIAGNOSIS — R350 Frequency of micturition: Secondary | ICD-10-CM | POA: Diagnosis not present

## 2018-04-25 ENCOUNTER — Ambulatory Visit: Payer: Medicare Other | Admitting: Podiatry

## 2018-04-27 ENCOUNTER — Other Ambulatory Visit: Payer: Self-pay | Admitting: Neurology

## 2018-04-27 ENCOUNTER — Other Ambulatory Visit: Payer: Self-pay | Admitting: Internal Medicine

## 2018-05-12 DIAGNOSIS — R350 Frequency of micturition: Secondary | ICD-10-CM | POA: Diagnosis not present

## 2018-05-12 DIAGNOSIS — R3915 Urgency of urination: Secondary | ICD-10-CM | POA: Diagnosis not present

## 2018-06-02 DIAGNOSIS — R350 Frequency of micturition: Secondary | ICD-10-CM | POA: Diagnosis not present

## 2018-06-02 DIAGNOSIS — R3915 Urgency of urination: Secondary | ICD-10-CM | POA: Diagnosis not present

## 2018-06-16 ENCOUNTER — Other Ambulatory Visit: Payer: Self-pay | Admitting: Internal Medicine

## 2018-06-23 DIAGNOSIS — R3915 Urgency of urination: Secondary | ICD-10-CM | POA: Diagnosis not present

## 2018-06-23 DIAGNOSIS — R350 Frequency of micturition: Secondary | ICD-10-CM | POA: Diagnosis not present

## 2018-07-04 ENCOUNTER — Encounter: Payer: Self-pay | Admitting: Internal Medicine

## 2018-07-06 ENCOUNTER — Telehealth: Payer: Self-pay | Admitting: Neurology

## 2018-07-06 NOTE — Telephone Encounter (Signed)
Patient would like to do the Tele-visit. Patient was originally scheduled for Friday at 11:15 and would like to keep same time if possible.  Patient consented and was aware that there could be a copay charge. Please call him to set this up. You can use both numbers on file for the patient to reach him. Thanks yall!

## 2018-07-07 NOTE — Telephone Encounter (Signed)
This is not yet set up but we will be working on this.  Keep his name.  Find out if he has a smart phone or computer capable of video for video visit when we have ability to set it up.  We are working on this.

## 2018-07-07 NOTE — Telephone Encounter (Signed)
Spoke with patient and his family. They do have video capable device and will proceed with video visit when it is set up.  His daughter Caryl Pina asked that we use her number to facilitate this visit. [phone].

## 2018-07-08 ENCOUNTER — Other Ambulatory Visit: Payer: Self-pay | Admitting: Neurology

## 2018-07-08 ENCOUNTER — Ambulatory Visit: Payer: Self-pay | Admitting: Neurology

## 2018-07-08 ENCOUNTER — Other Ambulatory Visit: Payer: Self-pay | Admitting: Internal Medicine

## 2018-07-12 NOTE — Progress Notes (Signed)
Virtual Visit via Video Note The purpose of this virtual visit is to provide medical care while limiting exposure to the novel coronavirus.    Consent was obtained for video visit:  Yes.   Answered questions that patient had about telehealth interaction:  Yes.   I discussed the limitations, risks, security and privacy concerns of performing an evaluation and management service by telemedicine. I also discussed with the patient that there may be a patient responsible charge related to this service. The patient expressed understanding and agreed to proceed.  Pt location: Home Physician Location: office Name of referring provider:  Colon Branch, MD I connected with [name] at patients initiation/[name] on 07/14/2018 at  3:00 PM EDT by video enabled telemedicine application and verified that I am speaking with the correct person using two identifiers. Pt MRN:  [id number] Pt DOB:  11/01/1933  Pt [name] also present at [name]   History of Present Illness: Patient seen in follow-up for Parkinson's disease.  He is on carbidopa/levodopa 25/100, 2 tablets at 8 AM/noon/4 PM/8 PM and carbidopa/levodopa 50/200 at bedtime.  Noting some facial dyskinesia.    No falls since last visit.  No lightheadedness or near syncope.  No hallucinations. Pt noting more trouble with memory and concentration.   He has 24 hour/day caregiving right now because Joe wife is home because of the pandemic, but generally he has about 3 hours/day where he is home alone.  Wife asks about dementia and worsening of it.  Home physical and occupational therapy was completed after our last visit.  Last visit with primary care was March 01, 2018.  Those notes are reviewed.  Pt notes had 2 bladder operations for bladder cancer since last visit and bladder incontinence is worse b/c of it.  Drinking alcohol 1 time per day and "I try to keep that to the bare minimum.  I think it affects my anxiety."  Patient notes vivid dreams.   He does have bed rails.  Has some swallow trouble with levodopa.    Observations/Objective:   Vitals:   07/14/18 1520  BP: 126/61  Pulse: (!) 56  Temp: (!) 96.8 F (36 C)  Weight: 150 lb (68 kg)   GEN:  The patient appears stated age and is in NAD.  Neurological examination:  Orientation: The patient is alert and oriented x3. Cranial nerves: There is good facial symmetry. There is facial hypomimia.  The speech is fluent and clear. Soft palate rises symmetrically and there is no tongue deviation. Hearing is intact to conversational tone. Motor: Strength is at least antigravity x 4.   Shoulder shrug is equal and symmetric.  There is no pronator drift.  Movement examination: Tone: unable Abnormal movements: There is intermittent left and right upper extremity resting tremor.  There is mild facial dyskinesia. Coordination:  There is no decremation with RAM's, in the upper extremities.  I was unable to view the lower extremities within the camera. Gait and Station: The patient was positioned between a table and hutch, so he did not feel comfortable arising out of the chair.  I was able to initially watch him walk into view of the camera when the visit for started and he had a stooped posture and leaned on the table somewhat for support (but he was in a very small space).  Lab Results  Component Value Date   VITAMINB12 303 09/08/2016     Chemistry      Component Value Date/Time  NA 136 11/18/2017 1144   K 4.0 11/18/2017 1144   CL 104 11/18/2017 1144   CO2 25 11/18/2017 1144   BUN 27 (H) 11/18/2017 1144   CREATININE 0.89 11/18/2017 1144      Component Value Date/Time   CALCIUM 9.4 11/18/2017 1144   ALKPHOS 102 05/01/2015 1107   AST 8 05/12/2016 1544   ALT 2 05/12/2016 1544   BILITOT 2.7 (H) 05/01/2015 1107         Assessment and Plan:   1.  Idiopathic Parkinson's disease             -He will continue taking carbidopa/levodopa 25/100, 2 tablets at 8 AM/noon/4 PM and 8  PM.  He is also taking carbidopa/levodopa 50/200 at bedtime, which is also about 8 PM to 9 PM              -talked about walker at all times.  He is very resistant.  Already has a walker at home but doesn't use.             -Talked about 24 hour/day care. 2.  Dysphagia.             -MBE on 09/29/13 was normal.  Having some trouble with swallowing levodopa.  Recommended that he try this in applesauce, non-Greek yogurt or ice cream. 3.  Depression, confirmed by neuropsych testing and insomnia             -I thought he did better with mirtazapine, which has been discontinued.  He is now on Prozac.  He and I have talked about counseling many times, including today.  He asked me to send him the information about that, which I will do through my chart message. 4.  PDD             -He underwent neuropsych testing on 08/06/2015.  This confirmed mild dementia.    I suspect that this is likely now in the moderate range.  Wife asked me about this today.  We discussed dementia in detail.  Discussed that we could repeat neurocognitive testing.  The point of that would be to see if he is safe to be left alone as she generally goes to work (currently not going to work because of the pandemic) and he is left home for about 3 hours/day when there is not caregiving.  She will let me know if she/he would like to proceed with the neurocognitive testing.           -Had mild B12 deficiency back in 2018.  Do not think I want to bring him back into test B12, but wife states that she just started him on B12 about a month ago.  I told her to make sure that she is giving him 1000 mcg daily.  -Talked extensively about discontinuing alcohol.  Talked about its influence on memory, cognition, balance, sleep disturbance  -Talked about the importance of a regular sleep schedule and not taking prolonged naps.  Talked about timing of naps. 5.  Urinary incontinence             -Worse with recent bladder surgery for bladder cancer.  Is  following with urology. 6.  Low back pain             -He had surgery in October, 2018.  He is doing better but still says that this is why he cannot exercise along with fact he cannot find time to exercise 7.  REM behavior disorder  -  This is commonly associated with PD and the patient is experiencing this.  We discussed that this can be very serious and even harmful.  We talked about medications as well as physical barriers to put in the bed (particularly soft bed rails which he now has, pillow barriers).  We talked about moving the night stand so that it is not so close to the side of the bed.   Follow Up Instructions:    -I discussed the assessment and treatment plan with the patient. The patient was provided an opportunity to ask questions and all were answered. The patient agreed with the plan and demonstrated an understanding of the instructions.   The patient was advised to call back or seek an in-person evaluation if the symptoms worsen or if the condition fails to improve as anticipated.    Total Time spent in visit with the patient was:  40, of which more than 50% of the time was spent in counseling and/or coordinating care on safety.   Pt understands and agrees with the plan of care outlined.     Alonza Bogus, DO

## 2018-07-13 ENCOUNTER — Telehealth: Payer: Self-pay | Admitting: Neurology

## 2018-07-13 NOTE — Telephone Encounter (Signed)
Confirmed medications with patient's wife for e-visit tomorrow.   Patient does have access to blood pressure cuff, scale, thermometer. They will get these readings and let us know tomorrow.   Dr. Carles Collet Juluis Rainier.

## 2018-07-14 ENCOUNTER — Telehealth: Payer: Self-pay | Admitting: Neurology

## 2018-07-14 ENCOUNTER — Other Ambulatory Visit: Payer: Self-pay

## 2018-07-14 ENCOUNTER — Encounter: Payer: Self-pay | Admitting: Neurology

## 2018-07-14 ENCOUNTER — Telehealth (INDEPENDENT_AMBULATORY_CARE_PROVIDER_SITE_OTHER): Payer: Medicare Other | Admitting: Neurology

## 2018-07-14 VITALS — BP 126/61 | HR 56 | Temp 96.8°F | Wt 150.0 lb

## 2018-07-14 DIAGNOSIS — F0280 Dementia in other diseases classified elsewhere without behavioral disturbance: Secondary | ICD-10-CM

## 2018-07-14 DIAGNOSIS — G4752 REM sleep behavior disorder: Secondary | ICD-10-CM

## 2018-07-14 DIAGNOSIS — G20 Parkinson's disease: Secondary | ICD-10-CM

## 2018-07-14 DIAGNOSIS — E538 Deficiency of other specified B group vitamins: Secondary | ICD-10-CM

## 2018-07-14 NOTE — Telephone Encounter (Signed)
-----   Message from [place], DO sent at 07/14/2018  3:43 PM EDT ----- Please send my chart message with next appt (end of day) in 5 months.  Send information via my chart for [facility] counseling.

## 2018-07-14 NOTE — Telephone Encounter (Signed)
Mychart message sent to patient.

## 2018-08-19 ENCOUNTER — Encounter: Payer: Self-pay | Admitting: Internal Medicine

## 2018-08-19 ENCOUNTER — Other Ambulatory Visit: Payer: Self-pay

## 2018-08-19 ENCOUNTER — Ambulatory Visit (INDEPENDENT_AMBULATORY_CARE_PROVIDER_SITE_OTHER): Payer: Medicare Other | Admitting: Internal Medicine

## 2018-08-19 DIAGNOSIS — I10 Essential (primary) hypertension: Secondary | ICD-10-CM

## 2018-08-19 DIAGNOSIS — N3281 Overactive bladder: Secondary | ICD-10-CM | POA: Diagnosis not present

## 2018-08-19 DIAGNOSIS — N401 Enlarged prostate with lower urinary tract symptoms: Secondary | ICD-10-CM

## 2018-08-19 DIAGNOSIS — R5382 Chronic fatigue, unspecified: Secondary | ICD-10-CM

## 2018-08-19 DIAGNOSIS — R350 Frequency of micturition: Secondary | ICD-10-CM

## 2018-08-19 DIAGNOSIS — E785 Hyperlipidemia, unspecified: Secondary | ICD-10-CM

## 2018-08-19 DIAGNOSIS — M2550 Pain in unspecified joint: Secondary | ICD-10-CM

## 2018-08-19 DIAGNOSIS — G20 Parkinson's disease: Secondary | ICD-10-CM

## 2018-08-19 NOTE — Progress Notes (Signed)
Subjective:    Patient ID: [name], male    DOB: 1934/02/12, 85 y.o.   MRN: [id number]  DOS:  08/19/2018 Type of visit - description: Virtual Visit via Video Note  I connected with@ on 08/21/18 at  3:40 PM EDT by a video enabled telemedicine application and verified that I am speaking with the correct person using two identifiers.   THIS ENCOUNTER IS A VIRTUAL VISIT DUE TO COVID-19 - PATIENT WAS NOT SEEN IN THE OFFICE. PATIENT HAS CONSENTED TO VIRTUAL VISIT / TELEMEDICINE VISIT   Location of patient: home  Location of provider: office  I discussed the limitations of evaluation and management by telemedicine and the availability of in person appointments. The patient expressed understanding and agreed to proceed.  History of Present Illness: Routine office visit, both the patient and his wife were present High cholesterol: On no medications, due for a recheck Urinary incontinence: Continue to be major problem for the patient, he needs to get up many times at night and go urinate Gait disorder: Had a couple falls indoors and 1 fall on the yard fortunately with no major consequence and no injury. Also>>> Reports he has generalized joint pains elbows, knees, shoulders.  This is not a new issue but this is the first time he reports. Denies fever or  chills No myalgias per se No weight loss No visual disturbances except that occasionally vision is cloudy but gets better with artificial tears. I asked if he has noticed redness or puffiness on his hand joints and he said no. Mild headaches, at baseline.  Continue with dry mouth Good compliance with BP meds, normal BPs when checked   Review of Systems  See HPI He also  cough Reports feeling very sleepy.  Past Medical History:  Diagnosis Date  . Allergic rhinitis   . Anxiety   . Bladder cancer [facility])    urologist-  dr eskridge--  low grade TA  . BPH with elevated PSA   . Complication of anesthesia    "paranoid after back  surgery in october lasted 2 to 3 days"  . DDD (degenerative disc disease), lumbar   . Depression   . Dry mouth    USES BIOTIN SPRAY/ MOUTHWASH  . Frequency of urination   . GERD (gastroesophageal reflux disease)   . Hearing loss    does not wear his hearing aid  . History of kidney stones    10/1989  . Hyperlipidemia   . Hypertension   . Mild obstructive sleep apnea    PER PT  MILD OSA , NO CPAP RX,  STUDY DONE 2009  . Nocturia   . OAB (overactive bladder)   . Osteoarthritis    KNEES, SHOULDER  . Parkinson disease ([place]) DX   2005   NEUROLOGIST-   DR TAT--  IDIOPATHIC PARKINSON'S /  TREMORS CONTROLLED WITH MEDS  . Urge urinary incontinence     Past Surgical History:  Procedure Laterality Date  . CATARACT EXTRACTION W/ INTRAOCULAR LENS  IMPLANT, BILATERAL  2014  . CYSTOSCOPY Bilateral 04/09/2017   Procedure: CYSTOSCOPY/ RETROGRADE;  Surgeon: Festus Aloe, MD;  Location: [facility];  Service: Urology;  Laterality: Bilateral;  . CYSTOSCOPY W/ RETROGRADES Bilateral 12/14/2014   Procedure: CYSTOSCOPY BLADDER BIOPSY FULGERATION MITOMYCIN C BILATERAL RETROGRADE PYELOGRAM;  Surgeon: Festus Aloe, MD;  Location: [facility];  Service: Urology;  Laterality: Bilateral;  . CYSTOSCOPY WITH STENT PLACEMENT Left 12/19/2013   Procedure: CYSTOSCOPY BLADDER BX, LEFT URETERAL STENT PLACEMENT ,  LEFT RETROGRADE AND PYLOGRAM;  Surgeon: Festus Aloe, MD;  Location: [facility];  Service: Urology;  Laterality: Left;  . EYE SURGERY Bilateral    ioc for cataract  . HERNIA REPAIR  2013   inguinal  . INGUINAL HERNIA REPAIR  03/09/2012   Procedure: HERNIA REPAIR INGUINAL ADULT;  Surgeon: Adin Hector, MD;  Location: [facility];  Service: General;  Laterality: Right;  . INSERTION OF MESH  03/09/2012   Procedure: INSERTION OF MESH;  Surgeon: Adin Hector, MD;  Location: [facility];  Service: General;  Laterality: Right;  . LUMBAR LAMINECTOMY/DECOMPRESSION MICRODISCECTOMY  N/A 02/05/2017   Procedure: Laminectomy and Foraminotomy - Lumbar One-Two,Lumbar Two-Three, Lumbar Three-Four.;  Surgeon: Eustace Moore, MD;  Location: [place];  Service: Neurosurgery;  Laterality: N/A;  . NASAL SINUS SURGERY  1982  . ORIF RIGHT ARM FX  1949   HARDWARE REMOVED   . REMOVAL BENIGN CYST LEFT FOREHEAD  1969  . TRANSURETHRAL RESECTION OF BLADDER TUMOR N/A 12/19/2013   Procedure:  TRANSURETHRAL RESECTION OF BLADDER TUMOR WITH GYRUS (TURBT-GYRUS);  Surgeon: Festus Aloe, MD;  Location: [facility];  Service: Urology;  Laterality: N/A;  . TRANSURETHRAL RESECTION OF BLADDER TUMOR N/A 04/09/2017   Procedure: TRANSURETHRAL RESECTION OF BLADDER TUMOR / (TURBT) 2-5cm;  Surgeon: Festus Aloe, MD;  Location: [facility];  Service: Urology;  Laterality: N/A;  ONLY NEEDS 90 MIN FOR BOTH PROCEDURES    Social History   Socioeconomic History  . Marital status: Married    Spouse name: [name]  . Number of children: 1  . Years of education: Not on file  . Highest education level: Not on file  Occupational History  . Occupation: retired 1995, worked in [place] , finances   Social Needs  . Financial resource strain: Not on file  . Food insecurity:    Worry: Not on file    Inability: Not on file  . Transportation needs:    Medical: Not on file    Non-medical: Not on file  Tobacco Use  . Smoking status: Former Smoker    Packs/day: 1.00    Years: 15.00    Pack years: 15.00    Types: Cigarettes    Last attempt to quit: 04/20/1968    Years since quitting: 50.3  . Smokeless tobacco: Never Used  Substance and Sexual Activity  . Alcohol use: Yes    Alcohol/week: 7.0 standard drinks    Types: 7 Glasses of wine per week    Comment: ONE WINE PER DAY  . Drug use: No  . Sexual activity: Not Currently  Lifestyle  . Physical activity:    Days per week: Not on file    Minutes per session: Not on file  . Stress: Not on file  Relationships  . Social connections:    Talks on  phone: Not on file    Gets together: Not on file    Attends religious service: Not on file    Active member of club or organization: Not on file    Attends meetings of clubs or organizations: Not on file    Relationship status: Not on file  . Intimate partner violence:    Fear of current or ex partner: Not on file    Emotionally abused: Not on file    Physically abused: Not on file    Forced sexual activity: Not on file  Other Topics Concern  . Not on file  Social History Narrative   Lives w/  wife   Has a Daughter 66 y/o      Allergies as of 08/19/2018      Reactions   Pollen Extract Other (See Comments)   Other Other (See Comments)   Paranoia from anesthesia drug last visit-unknown drug       Medication List       Accurate as of Aug 19, 2018 11:59 PM. Always use your most recent med list.        acetaminophen 500 MG tablet Commonly known as:  TYLENOL Take 500-1,000 mg by mouth See admin instructions. 1000 mg in the morning, 500 mg at noon, 500 mg at 1600, and 1000 mg at night   carbidopa-levodopa 25-100 MG tablet Commonly known as:  SINEMET IR TAKE TWO TABLETS BY MOUTH FOUR TIMES DAILY   carbidopa-levodopa 50-200 MG tablet Commonly known as:  SINEMET CR TAKE 1 TABLET BY MOUTH EVERYDAY AT BEDTIME   docusate sodium 50 MG capsule Commonly known as:  COLACE Take 50-100 mg by mouth See admin instructions. Take 100 mg in the morning and 50 mg at 4pm   fexofenadine 180 MG tablet Commonly known as:  ALLEGRA Take 180 mg by mouth daily. At 1600   FLUoxetine 20 MG capsule Commonly known as:  PROZAC Take 2 capsules (40 mg total) by mouth daily.   fluticasone 50 MCG/ACT nasal spray Commonly known as:  FLONASE Place 2 sprays into both nostrils daily as needed for allergies or rhinitis.   ibuprofen 200 MG tablet Commonly known as:  ADVIL Take 400 mg by mouth every 8 (eight) hours as needed.   losartan 100 MG tablet Commonly known as:  COZAAR Take 1 tablet (100 mg  total) by mouth daily at 12 noon.   MULTIVITAMIN ADULT PO Take by mouth daily.   Myrbetriq 25 MG Tb24 tablet Generic drug:  mirabegron ER Take 50 mg by mouth daily. At 1600   polyethylene glycol 17 g packet Commonly known as:  MIRALAX / GLYCOLAX Take 17 g by mouth daily as needed for mild constipation.   PROBIOTIC DAILY PO Take 1 capsule by mouth daily.   REFRESH OP Place 1-2 drops into both eyes daily as needed (dry eyes).   ROLAIDS PO Take 2 tablets by mouth as needed (indigestion).   tamsulosin 0.4 MG Caps capsule Commonly known as:  FLOMAX Take 0.4 mg by mouth daily after supper.   TURMERIC PO Take by mouth daily.   vitamin E 100 UNIT capsule Take by mouth as needed.           Objective:   Physical Exam There were no vitals taken for this visit. This is a virtual video visit, the patient seems to be at baseline, alert oriented x3.  Speech is slightly slow but otherwise normal     Assessment     Assessment HTN Hyperlipidemia Depression - anxiety: Lexapro intolerant 10-2016 OSA, mild per remote sleep study (~2008?), no CPAP Parkinson disease Dr. Carles Collet Dementia, mild GU: Dr. Junious Silk --BPH, elevated PSA --Bladder cancer-low-grade --OAB, incontinence  -- E.D. Dysphagia, MBE 09-2013 normal Dry mouth  DJD- back pain, sees Dr Nelva Bush, s/p local injections, on prn hydrocodone, surgery 2018 L > R LE edema: [place] (-) DVT 04-2016 HOH   PLAN: HTN: Continue with losartan.  Most recent BP 143/76.  Check CMP. Hyperlipidemia: On no medications, check a FLP.  The patient is consider whether or not he will go back on medication. Arthralgias, DJD: As described above, he has no fever chills, no  weight loss, no myalgias.  Suspect pain is related to DJD but will check a CBC and sed rate to be sure. Fatigue, feeling sleepy: Likely multifactorial, has a history of remote OSA, mild, not on CPAP. Will check in general labs today, will add TSH. Bladder incontinence, BPH, OAB: :  That continue to be a major problem for the patient, encouraged to continue working with urology.  Fall precautions discussed. Parkinson disease: last visit with neurology 07/14/2018, continue with the same medications, was recommended to use his walker consistently. Plan: Labs next week.  Fasting Follow-up face-to-face 2 months  Time spent 25 minutes I discussed the assessment and treatment plan with the patient. The patient was provided an opportunity to ask questions and all were answered. The patient agreed with the plan and demonstrated an understanding of the instructions.   The patient was advised to call back or seek an in-person evaluation if the symptoms worsen or if the condition fails to improve as anticipated.

## 2018-08-21 DIAGNOSIS — N3281 Overactive bladder: Secondary | ICD-10-CM | POA: Insufficient documentation

## 2018-08-21 DIAGNOSIS — N400 Enlarged prostate without lower urinary tract symptoms: Secondary | ICD-10-CM | POA: Insufficient documentation

## 2018-08-21 NOTE — Assessment & Plan Note (Signed)
HTN: Continue with losartan.  Most recent BP 143/76.  Check CMP. Hyperlipidemia: On no medications, check a FLP.  The patient is consider whether or not he will go back on medication. Arthralgias, DJD: As described above, he has no fever chills, no weight loss, no myalgias.  Suspect pain is related to DJD but will check a CBC and sed rate to be sure. Fatigue, feeling sleepy: Likely multifactorial, has a history of remote OSA, mild, not on CPAP. Will check in general labs today, will add TSH. Bladder incontinence, BPH, OAB: : That continue to be a major problem for the patient, encouraged to continue working with urology.  Fall precautions discussed. Parkinson disease: last visit with neurology 07/14/2018, continue with the same medications, was recommended to use his walker consistently. Plan: Labs next week.  Fasting Follow-up face-to-face 2 months

## 2018-08-22 NOTE — Addendum Note (Signed)
Addended byDamita Dunnings D on: 08/22/2018 07:46 AM   Modules accepted: Orders

## 2018-08-26 ENCOUNTER — Encounter: Payer: Self-pay | Admitting: Internal Medicine

## 2018-08-31 ENCOUNTER — Other Ambulatory Visit: Payer: Self-pay

## 2018-08-31 ENCOUNTER — Other Ambulatory Visit (INDEPENDENT_AMBULATORY_CARE_PROVIDER_SITE_OTHER): Payer: Medicare Other

## 2018-08-31 DIAGNOSIS — I10 Essential (primary) hypertension: Secondary | ICD-10-CM

## 2018-08-31 DIAGNOSIS — M2550 Pain in unspecified joint: Secondary | ICD-10-CM

## 2018-08-31 DIAGNOSIS — E785 Hyperlipidemia, unspecified: Secondary | ICD-10-CM | POA: Diagnosis not present

## 2018-08-31 LAB — CBC WITH DIFFERENTIAL/PLATELET
Basophils Absolute: 0.1 10*3/uL (ref 0.0–0.1)
Basophils Relative: 1.4 % (ref 0.0–3.0)
Eosinophils Absolute: 0.4 10*3/uL (ref 0.0–0.7)
Eosinophils Relative: 5.8 % — ABNORMAL HIGH (ref 0.0–5.0)
HCT: 39.2 % (ref 39.0–52.0)
Hemoglobin: 13.1 g/dL (ref 13.0–17.0)
Lymphocytes Relative: 19.1 % (ref 12.0–46.0)
Lymphs Abs: 1.3 10*3/uL (ref 0.7–4.0)
MCHC: 33.4 g/dL (ref 30.0–36.0)
MCV: 97.3 fl (ref 78.0–100.0)
Monocytes Absolute: 0.5 10*3/uL (ref 0.1–1.0)
Monocytes Relative: 8.3 % (ref 3.0–12.0)
Neutro Abs: 4.3 10*3/uL (ref 1.4–7.7)
Neutrophils Relative %: 65.4 % (ref 43.0–77.0)
Platelets: 263 10*3/uL (ref 150.0–400.0)
RBC: 4.03 Mil/uL — ABNORMAL LOW (ref 4.22–5.81)
RDW: 13.9 % (ref 11.5–15.5)
WBC: 6.6 10*3/uL (ref 4.0–10.5)

## 2018-08-31 LAB — LIPID PANEL
Cholesterol: 199 mg/dL (ref 0–200)
HDL: 54.2 mg/dL
LDL Cholesterol: 134 mg/dL — ABNORMAL HIGH (ref 0–99)
NonHDL: 145.2
Total CHOL/HDL Ratio: 4
Triglycerides: 58 mg/dL (ref 0.0–149.0)
VLDL: 11.6 mg/dL (ref 0.0–40.0)

## 2018-08-31 LAB — COMPREHENSIVE METABOLIC PANEL
ALT: 3 U/L (ref 0–53)
AST: 11 U/L (ref 0–37)
Albumin: 4.3 g/dL (ref 3.5–5.2)
Alkaline Phosphatase: 65 U/L (ref 39–117)
BUN: 25 mg/dL — ABNORMAL HIGH (ref 6–23)
CO2: 27 mEq/L (ref 19–32)
Calcium: 9.3 mg/dL (ref 8.4–10.5)
Chloride: 103 mEq/L (ref 96–112)
Creatinine, Ser: 0.91 mg/dL (ref 0.40–1.50)
GFR: 79.68 mL/min (ref >60.00)
Glucose, Bld: 83 mg/dL (ref 70–99)
Potassium: 4.1 mEq/L (ref 3.5–5.1)
Sodium: 138 mEq/L (ref 135–145)
Total Bilirubin: 1.7 mg/dL — ABNORMAL HIGH (ref 0.2–1.2)
Total Protein: 6.5 g/dL (ref 6.0–8.3)

## 2018-08-31 LAB — SEDIMENTATION RATE: Sed Rate: 1 mm/hr (ref 0–20)

## 2018-08-31 LAB — TSH: TSH: 0.84 u[IU]/mL (ref 0.35–4.50)

## 2018-10-02 ENCOUNTER — Other Ambulatory Visit: Payer: Self-pay | Admitting: Neurology

## 2018-10-02 ENCOUNTER — Other Ambulatory Visit: Payer: Self-pay | Admitting: Internal Medicine

## 2018-10-03 DIAGNOSIS — Z8551 Personal history of malignant neoplasm of bladder: Secondary | ICD-10-CM | POA: Diagnosis not present

## 2018-10-03 DIAGNOSIS — R350 Frequency of micturition: Secondary | ICD-10-CM | POA: Diagnosis not present

## 2018-10-03 DIAGNOSIS — N401 Enlarged prostate with lower urinary tract symptoms: Secondary | ICD-10-CM | POA: Diagnosis not present

## 2018-10-03 NOTE — Telephone Encounter (Signed)
Requested Prescriptions   Pending Prescriptions Disp Refills  . carbidopa-levodopa (SINEMET IR) 25-100 MG tablet [Pharmacy Med Name: CARBIDOPA-LEVODOPA 25-100 TAB] 720 tablet 1    Sig: TAKE TWO TABLETS BY MOUTH FOUR TIMES DAILY   Rx last filled: 04/27/18 #720 1 refills  Pt last seen:07/14/18  Follow up appt scheduled:12/16/18

## 2018-10-05 ENCOUNTER — Encounter: Payer: Self-pay | Admitting: Internal Medicine

## 2018-10-26 ENCOUNTER — Encounter: Payer: Self-pay | Admitting: Internal Medicine

## 2018-10-27 ENCOUNTER — Other Ambulatory Visit: Payer: Self-pay | Admitting: Internal Medicine

## 2018-10-27 MED ORDER — SERTRALINE HCL 50 MG PO TABS: ORAL_TABLET | ORAL | 1 refills | Status: DC

## 2018-11-03 ENCOUNTER — Other Ambulatory Visit: Payer: Self-pay | Admitting: Internal Medicine

## 2018-12-14 ENCOUNTER — Other Ambulatory Visit: Payer: Self-pay

## 2018-12-14 NOTE — Progress Notes (Signed)
Virtual Visit via Video Note The purpose of this virtual visit is to provide medical care while limiting exposure to the novel coronavirus.    Consent was obtained for video visit:  Yes.   Answered questions that patient had about telehealth interaction:  Yes.   I discussed the limitations, risks, security and privacy concerns of performing an evaluation and management service by telemedicine. I also discussed with the patient that there may be a patient responsible charge related to this service. The patient expressed understanding and agreed to proceed.  Pt location: Home Physician Location: home Name of referring provider:  Colon Branch, MD I connected with [name] at patients initiation/request on 12/16/2018 at  1:00 PM EDT by video enabled telemedicine application and verified that I am speaking with the correct person using two identifiers. Pt MRN:  [id number] Pt DOB:  13-Feb-1934     History of Present Illness: Patient seen in follow-up for Parkinson's disease.  This patient is accompanied in the office by his spouse, daughter and caregiver, [name], who supplements the history.Patient is on carbidopa/levodopa 25/100, 2 tablets at 8 AM/noon/4 PM/8 PM, in addition to carbidopa/levodopa 50/200 at bedtime.   Pt has had several falls but refuses to use the walker.  "I don't think that it is helpful." Pt denies lightheadedness, near syncope.  No hallucinations.  Dr. Larose Kells has changed his fluoxetine to sertraline since our last visit because of mood issues.  This was just done on July 8.  Wife states that zoloft helped but "certainly not enough."  The records that were made available to me were reviewed.  He is using the bike pedals during the day for 12 min during the day.  Urinary incontinence continues to be an issue.  Sees urology.  On myrbetriq.  That didn't help.  They added detrol but low dose at 2 mg.      Observations/Objective:   Vitals:   12/16/18 0907  Weight: 148 lb (67.1 kg)   Height: 5\' 10"  (1.778 m)   GEN:  The patient appears stated age and is in NAD.  Neurological examination:  Orientation: The patient is alert and oriented x3. Cranial nerves: There is good facial symmetry. There is facial hypomimia.  The speech is fluent and clear. Soft palate rises symmetrically and there is no tongue deviation. Hearing is intact to conversational tone. Motor: Strength is at least antigravity x 4.   Shoulder shrug is equal and symmetric.  There is no pronator drift.  Movement examination: Tone: unable Abnormal movements: There is intermittent left UE rest tremor Coordination:  There is no decremation with RAM's, in the upper extremities.  I was unable to view the lower extremities within the camera. Gait and Station: The patient pushes off of his table to arise.  He ambulates with his cane.  He has decreased arm swing on the left and holds the cane in the right hand.  Lab Results  Component Value Date   VITAMINB12 303 09/08/2016     Chemistry      Component Value Date/Time   NA 138 08/31/2018 1050   K 4.1 08/31/2018 1050   CL 103 08/31/2018 1050   CO2 27 08/31/2018 1050   BUN 25 (H) 08/31/2018 1050   CREATININE 0.91 08/31/2018 1050      Component Value Date/Time   CALCIUM 9.3 08/31/2018 1050   ALKPHOS 65 08/31/2018 1050   AST 11 08/31/2018 1050   ALT 3 08/31/2018 1050   BILITOT 1.7 (  H) 08/31/2018 1050         Assessment and Plan:   1.  Idiopathic Parkinson's disease             -He will continue taking carbidopa/levodopa 25/100, 2 tablets at 8 AM/noon/4 PM and 8 PM.  He is also taking carbidopa/levodopa 50/200 at bedtime, which is also about 8 PM to 9 PM              -refuses walker but discussed about it again  -refer for home PT.  Pt very resistant though  -Biggest issue continues to be the multiple barriers that he puts up to his own care, and his negative view of the world.  He declines nearly everything that is offered, and then complains that he  is not feeling well and is going downhill.  His family seems to recognize the issue, but cannot change pts mindset. 2.  Dysphagia.             -MBE on 09/29/13 was normal.  Having some trouble with swallowing levodopa.  Recommended that he try this in applesauce, non-Greek yogurt or ice cream. 3.  Depression, confirmed by neuropsych testing and insomnia             -been on several things.  Dr. Larose Kells recently changed to zoloft.  Wife thinks helped some but not enough.  Encouraged to call Dr. Larose Kells  -very resistant to counseling and refused today but stated he would think about it.   4.  PDD             -He underwent neuropsych testing on 08/06/2015.  This confirmed mild dementia.    I suspect that this is likely now in the moderate range.  Wife asked me about this today.  We discussed dementia in detail.  Discussed that we could repeat neurocognitive testing.  The point of that would be to see if he is safe to be left alone as she generally goes to work (currently not going to work because of the pandemic) and he is left home for about 3 hours/day when there is not caregiving.  She will let me know if she/he would like to proceed with the neurocognitive testing.           -On B12 supplements.  -Talked extensively about discontinuing alcohol.  Talked about its influence on memory, cognition, balance, sleep disturbance  -Talked about the importance of a regular sleep schedule and not taking prolonged naps.  Talked about timing of naps. 5.  Urinary incontinence             -Worse with recent bladder surgery for bladder cancer.  Is following with urology. 6.  Low back pain             -s/p surgical intervention in 01/2017 7.  REM behavior disorder  -This is commonly associated with PD and the patient is experiencing this.  We discussed that this can be very serious and even harmful.  We talked about medications as well as physical barriers to put in the bed (particularly soft bed rails which he now has,  pillow barriers).  We talked about moving the night stand so that it is not so close to the side of the bed.   Follow Up Instructions:    -I discussed the assessment and treatment plan with the patient. The patient was provided an opportunity to ask questions and all were answered. The patient agreed with the plan and demonstrated an  understanding of the instructions.   The patient was advised to call back or seek an in-person evaluation if the symptoms worsen or if the condition fails to improve as anticipated.    Total Time spent in visit with the patient was:  25 min, of which more than 50% of the time was spent in counseling and/or coordinating care on safety.   Pt understands and agrees with the plan of care outlined.     Alonza Bogus, DO

## 2018-12-16 ENCOUNTER — Other Ambulatory Visit: Payer: Self-pay

## 2018-12-16 ENCOUNTER — Encounter: Payer: Self-pay | Admitting: Neurology

## 2018-12-16 ENCOUNTER — Telehealth (INDEPENDENT_AMBULATORY_CARE_PROVIDER_SITE_OTHER): Payer: Medicare Other | Admitting: Neurology

## 2018-12-16 VITALS — Ht 70.0 in | Wt 148.0 lb

## 2018-12-16 DIAGNOSIS — F0280 Dementia in other diseases classified elsewhere without behavioral disturbance: Secondary | ICD-10-CM | POA: Diagnosis not present

## 2018-12-16 DIAGNOSIS — G20 Parkinson's disease: Secondary | ICD-10-CM

## 2018-12-16 DIAGNOSIS — G20A1 Parkinson's disease without dyskinesia, without mention of fluctuations: Secondary | ICD-10-CM

## 2018-12-16 NOTE — Addendum Note (Signed)
Addended by: Ranae Plumber on: 12/16/2018 02:52 PM   Modules accepted: Orders

## 2018-12-29 ENCOUNTER — Other Ambulatory Visit: Payer: Self-pay | Admitting: Internal Medicine

## 2018-12-30 ENCOUNTER — Telehealth: Payer: Self-pay | Admitting: Neurology

## 2018-12-30 NOTE — Telephone Encounter (Signed)
Called Claiborne Billings no answer left message with verbal order approval

## 2018-12-30 NOTE — Telephone Encounter (Signed)
Claiborne Billings with [facility] called and left a message requesting verbal orders for physical therapy: 1 x week 1, 2 x 3 weeks, 1 x 1 week.

## 2018-12-31 ENCOUNTER — Other Ambulatory Visit: Payer: Self-pay | Admitting: Neurology

## 2019-01-02 NOTE — Telephone Encounter (Signed)
Requested Prescriptions   Pending Prescriptions Disp Refills  . carbidopa-levodopa (SINEMET CR) 50-200 MG tablet [Pharmacy Med Name: CARBIDOPA-LEVO ER 50-200 TAB] 90 tablet 1    Sig: TAKE 1 TABLET BY MOUTH EVERYDAY AT BEDTIME   Rx last filled:07/11/18 #90 1 REFILLS  Pt last seen: 12/16/18   Follow up appt scheduled:06/15/2019

## 2019-01-18 NOTE — Telephone Encounter (Signed)
Sending to clinical staff for review: Okay to sign/close encounter or is further follow up needed? ° °

## 2019-01-25 ENCOUNTER — Telehealth: Payer: Self-pay | Admitting: Neurology

## 2019-01-25 NOTE — Telephone Encounter (Signed)
[name] from [facility] called requesting verbal orders to continue home physical therapy: 3 times for week one, then 1 time a week for 2 weeks

## 2019-01-26 NOTE — Telephone Encounter (Signed)
Called no answer left verbal approval on voice mail

## 2019-02-01 ENCOUNTER — Encounter: Payer: Self-pay | Admitting: Internal Medicine

## 2019-02-02 ENCOUNTER — Telehealth: Payer: Self-pay | Admitting: Internal Medicine

## 2019-02-02 NOTE — Telephone Encounter (Signed)
Form placed in PCP red folder for completion.

## 2019-02-02 NOTE — Telephone Encounter (Signed)
[name] spouse dropped off document to be filled out by provider (1 page - Disability parking placard) Pt would like to be called at [phone] when document ready to pick up. Document put at front office tray.

## 2019-02-03 NOTE — Telephone Encounter (Signed)
signed

## 2019-02-03 NOTE — Telephone Encounter (Signed)
Spoke w/ [name], [name] wife, informed form ready for pick up at front desk. Copy sent for scanning.

## 2019-04-03 DIAGNOSIS — R3915 Urgency of urination: Secondary | ICD-10-CM | POA: Diagnosis not present

## 2019-04-03 DIAGNOSIS — Z8551 Personal history of malignant neoplasm of bladder: Secondary | ICD-10-CM | POA: Diagnosis not present

## 2019-04-05 ENCOUNTER — Other Ambulatory Visit: Payer: Self-pay | Admitting: Internal Medicine

## 2019-04-06 ENCOUNTER — Other Ambulatory Visit: Payer: Self-pay | Admitting: Neurology

## 2019-05-28 ENCOUNTER — Ambulatory Visit: Payer: Self-pay

## 2019-06-13 ENCOUNTER — Ambulatory Visit: Payer: Medicare Other

## 2019-06-13 NOTE — Progress Notes (Signed)
[name] was seen today in follow up for Parkinsons disease.  My previous records were reviewed prior to todays visit as well as outside records available to me. Pt has had more falls - wife thinks that they happen every few weeks but hasn't gotten hurt.  He goes to the R each time.   He doesn't use a walker.  Generally, he isn't even using the cane when he falls. Pt denies lightheadedness, near syncope.  No hallucinations.  Up at night to go to the RR.  caregiving schedule: 8p-8a (sun-Thursday); 9am-2pm (mon-Friday).  Other times, wife is there working from home.  She is not sure if she will be made to go physically back into work  Current prescribed movement disorder medications: carbidopa/levodopa 25/100, 2 tablets at 8 AM/noon/4 PM/8 PM  carbidopa/levodopa 50/200 at bedtime   ALLERGIES:   Allergies  Allergen Reactions  . Pollen Extract Other (See Comments)  . Other Other (See Comments)    Paranoia from anesthesia drug last visit-unknown drug     CURRENT MEDICATIONS:  Outpatient Encounter Medications as of 06/15/2019  Medication Sig  . acetaminophen (TYLENOL) 500 MG tablet Take 500-1,000 mg by mouth See admin instructions. 1000 mg in the morning, 500 mg at noon, 500 mg at 1600, and 1000 mg at night  . Ca Carbonate-Mag Hydroxide (ROLAIDS PO) Take 2 tablets by mouth as needed (indigestion).   . carbidopa-levodopa (SINEMET CR) 50-200 MG tablet TAKE 1 TABLET BY MOUTH EVERYDAY AT BEDTIME  . carbidopa-levodopa (SINEMET IR) 25-100 MG tablet TAKE TWO TABLETS BY MOUTH FOUR TIMES DAILY  . docusate sodium (COLACE) 50 MG capsule Take 50-100 mg by mouth See admin instructions. Take 100 mg in the morning and 50 mg at 4pm  . fexofenadine (ALLEGRA) 180 MG tablet Take 180 mg by mouth daily. At 1600  . fluticasone (FLONASE) 50 MCG/ACT nasal spray Place 2 sprays into both nostrils daily as needed for allergies or rhinitis.  Marland Kitchen ibuprofen (ADVIL,MOTRIN) 200 MG tablet Take 400 mg by mouth every 8  (eight) hours as needed.  Marland Kitchen losartan (COZAAR) 100 MG tablet Take 1 tablet (100 mg total) by mouth daily at 12 noon.  . Multiple Vitamins-Minerals (MULTIVITAMIN ADULT PO) Take by mouth daily.  Marland Kitchen MYRBETRIQ 25 MG TB24 tablet Take 50 mg by mouth daily. At 1600  . polyethylene glycol (MIRALAX / GLYCOLAX) packet Take 17 g by mouth daily as needed for mild constipation.   . Polyvinyl Alcohol-Povidone (REFRESH OP) Place 1-2 drops into both eyes daily as needed (dry eyes).  . Probiotic Product (PROBIOTIC DAILY PO) Take 1 capsule by mouth daily.  . sertraline (ZOLOFT) 100 MG tablet TAKE 1 TABLET EVERY DAY  . tamsulosin (FLOMAX) 0.4 MG CAPS capsule Take 0.4 mg by mouth daily after supper.   . tolterodine (DETROL) 2 MG tablet Take 2 mg by mouth at bedtime.   . vitamin E 100 UNIT capsule Take by mouth as needed.  . [DISCONTINUED] TURMERIC PO Take by mouth daily.   No facility-administered encounter medications on file as of 06/15/2019.    PHYSICAL EXAMINATION:    VITALS:   Vitals:   06/15/19 1302  BP: (!) 118/53  Pulse: 72  SpO2: 97%  Weight: 140 lb (63.5 kg)  Height: 5' 10.5" (1.791 m)    GEN:  The patient appears stated age and is in NAD. HEENT:  Normocephalic, atraumatic.  The mucous membranes are moist. The superficial temporal arteries are without ropiness or tenderness. CV:  RRR Lungs:  CTAB Neck/HEME:  There are no carotid bruits bilaterally.  Neurological examination:  Orientation: The patient is alert and oriented x2.  Looks to his wife for finer aspects of the history.  He is somewhat repetitive. Cranial nerves: There is good facial symmetry with mild facial hypomimia. The speech is fluent and clear. Soft palate rises symmetrically and there is no tongue deviation. Hearing is decreased to conversational tone. Sensation: Sensation is intact to light touch throughout Motor: Strength is at least antigravity x4.  Movement examination: Tone: There is normal tone in the upper and  lower extremities Abnormal movements: there is dyskinesia in the bilateral LE, L more than R  ASSESSMENT/PLAN:  1.  Parkinsons Disease  -Continue carbidopa/levodopa 25/100, 2 tablets at 8 AM/noon/4 PM/8 PM  -Continue carbidopa/levodopa 50/200 at bedtime  -Discussed that I recommend walker at all times.  He is resistant (states that "its not practical") but most falls have happened even without the cane.  Safety discussed. 2.  Dysphagia  -I would recommend levodopa to be taken in applesauce, non-Greek yogurt or ice cream 3.  Depression, confirmed by neurocognitive testing  -Primary care is managing.  On sertraline 4.  PDD  -Last neurocognitive testing was in 2017.  Pt needs 24 hour per day care.  They have that right now while wife working from home  -discussed [facility] for memory  -discussed acetycholinesterase inhibs and r/b/se of them.  Discussed donepezil.  After some discussion, they decided to start that.  We start 5 mg daily for 1 month and then increase to 10 mg daily.   5.  Urinary incontinence, with history of bladder cancer  -Follows with urology 6.  RBD  -Bedroom safety discussed.  Total time spent on today's visit was 30 minutes, including both face-to-face time and nonface-to-face time.  Time included that spent on review of records (prior notes available to me/labs/imaging if pertinent), discussing treatment and goals, answering patient's questions and coordinating care.  Cc:  Colon Branch, MD

## 2019-06-15 ENCOUNTER — Other Ambulatory Visit: Payer: Self-pay

## 2019-06-15 ENCOUNTER — Ambulatory Visit: Payer: Medicare Other | Admitting: Neurology

## 2019-06-15 ENCOUNTER — Encounter: Payer: Self-pay | Admitting: Neurology

## 2019-06-15 ENCOUNTER — Ambulatory Visit (INDEPENDENT_AMBULATORY_CARE_PROVIDER_SITE_OTHER): Payer: Medicare Other | Admitting: Clinical

## 2019-06-15 VITALS — BP 118/53 | HR 72 | Ht 70.5 in | Wt 140.0 lb

## 2019-06-15 DIAGNOSIS — G4752 REM sleep behavior disorder: Secondary | ICD-10-CM | POA: Diagnosis not present

## 2019-06-15 DIAGNOSIS — Z719 Counseling, unspecified: Secondary | ICD-10-CM

## 2019-06-15 DIAGNOSIS — G20 Parkinson's disease: Secondary | ICD-10-CM | POA: Diagnosis not present

## 2019-06-15 DIAGNOSIS — F0280 Dementia in other diseases classified elsewhere without behavioral disturbance: Secondary | ICD-10-CM | POA: Diagnosis not present

## 2019-06-15 DIAGNOSIS — Z7189 Other specified counseling: Secondary | ICD-10-CM

## 2019-06-15 MED ORDER — DONEPEZIL HCL 10 MG PO TABS: 10.0000 mg | ORAL_TABLET | Freq: Every day | ORAL | 1 refills | Status: DC

## 2019-06-15 MED ORDER — DONEPEZIL HCL 5 MG PO TABS: 5.0000 mg | ORAL_TABLET | Freq: Every day | ORAL | 0 refills | Status: DC

## 2019-06-15 NOTE — Patient Instructions (Signed)
Start donepezil, 5 mg daily x 1 month and then you can increase to 10 mg daily.

## 2019-06-16 NOTE — BH Specialist Note (Signed)
Referring Provider: Alonza Bogus, DO Date of Referral: 06/17/19 Primary Reason for Referral: caregiver education  Location of Visit: pt & wife, office visit  Suicide/Homicide Risk: Pt denies risk  Subjective Notes: Wife at home working d/t covid pandemic Pt dementia related sx increasing, straining wife, wife interested in resources  Psychosocial Assessment Patient presents today for caregiver education with LCSW following referral by  provider Dr. Wells Guiles Tat.  LCSW provided pt with information about our support and educational groups for patients with Parkinson's as well as their care partners. Wife endorses caregiver stress as pt's sx of dementia have progressed.  Discussed availability of respite and day program services to provide support.  Also discussed availability of individual counseling sessions to support caregivers, wife precontemplative at this time, but encouraged her to consider for future. Family responded receptively to patient education today.   Brief Interventions provided today in session 1. psychoeducation, caregiver education 2. Supportive counseling   Plan 1. Feel free to contact LCSW with any questions related to Parkinson's & behavioral health/dementia/psychosis 2. Encouraged wife to attend PD care partner support meetings 3. Provided respite and [facility] day program info   Behavioral Health treatment recommendations communicated to referring provider and pt states agreement with plan. LCSW will remain available for future consultation.

## 2019-06-30 ENCOUNTER — Other Ambulatory Visit: Payer: Self-pay | Admitting: Internal Medicine

## 2019-06-30 ENCOUNTER — Other Ambulatory Visit: Payer: Self-pay | Admitting: Neurology

## 2019-07-02 ENCOUNTER — Encounter: Payer: Self-pay | Admitting: Internal Medicine

## 2019-07-15 ENCOUNTER — Other Ambulatory Visit: Payer: Self-pay | Admitting: Neurology

## 2019-07-18 ENCOUNTER — Emergency Department (HOSPITAL_BASED_OUTPATIENT_CLINIC_OR_DEPARTMENT_OTHER): Payer: Medicare Other

## 2019-07-18 ENCOUNTER — Other Ambulatory Visit: Payer: Self-pay

## 2019-07-18 ENCOUNTER — Emergency Department (HOSPITAL_BASED_OUTPATIENT_CLINIC_OR_DEPARTMENT_OTHER)
Admission: EM | Admit: 2019-07-18 | Discharge: 2019-07-19 | Disposition: A | Discharge status: Home / Self Care | Payer: Medicare Other | Attending: Emergency Medicine | Admitting: Emergency Medicine

## 2019-07-18 ENCOUNTER — Encounter (HOSPITAL_BASED_OUTPATIENT_CLINIC_OR_DEPARTMENT_OTHER): Payer: Self-pay | Admitting: Emergency Medicine

## 2019-07-18 DIAGNOSIS — W010XXA Fall on same level from slipping, tripping and stumbling without subsequent striking against object, initial encounter: Secondary | ICD-10-CM | POA: Insufficient documentation

## 2019-07-18 DIAGNOSIS — W19XXXA Unspecified fall, initial encounter: Secondary | ICD-10-CM

## 2019-07-18 DIAGNOSIS — Y999 Unspecified external cause status: Secondary | ICD-10-CM | POA: Diagnosis not present

## 2019-07-18 DIAGNOSIS — S61412A Laceration without foreign body of left hand, initial encounter: Secondary | ICD-10-CM | POA: Diagnosis not present

## 2019-07-18 DIAGNOSIS — Y92099 Unspecified place in other non-institutional residence as the place of occurrence of the external cause: Secondary | ICD-10-CM | POA: Insufficient documentation

## 2019-07-18 DIAGNOSIS — G20 Parkinson's disease: Secondary | ICD-10-CM | POA: Insufficient documentation

## 2019-07-18 DIAGNOSIS — S0990XA Unspecified injury of head, initial encounter: Secondary | ICD-10-CM | POA: Diagnosis not present

## 2019-07-18 DIAGNOSIS — Z23 Encounter for immunization: Secondary | ICD-10-CM | POA: Diagnosis not present

## 2019-07-18 DIAGNOSIS — S0181XA Laceration without foreign body of other part of head, initial encounter: Secondary | ICD-10-CM | POA: Insufficient documentation

## 2019-07-18 DIAGNOSIS — Y92009 Unspecified place in unspecified non-institutional (private) residence as the place of occurrence of the external cause: Secondary | ICD-10-CM

## 2019-07-18 DIAGNOSIS — Y9389 Activity, other specified: Secondary | ICD-10-CM | POA: Diagnosis not present

## 2019-07-18 DIAGNOSIS — S069X0A Unspecified intracranial injury without loss of consciousness, initial encounter: Secondary | ICD-10-CM

## 2019-07-18 DIAGNOSIS — S0191XA Laceration without foreign body of unspecified part of head, initial encounter: Secondary | ICD-10-CM

## 2019-07-18 DIAGNOSIS — M1990 Unspecified osteoarthritis, unspecified site: Secondary | ICD-10-CM | POA: Diagnosis not present

## 2019-07-18 DIAGNOSIS — Z79899 Other long term (current) drug therapy: Secondary | ICD-10-CM | POA: Insufficient documentation

## 2019-07-18 MED ORDER — LIDOCAINE-EPINEPHRINE (PF) 2 %-1:200000 IJ SOLN
20.0000 mL | Freq: Once | INTRAMUSCULAR | Status: AC
  Administered 2019-07-18: 20 mL
  Filled 2019-07-18: qty 20

## 2019-07-18 MED ORDER — TETANUS-DIPHTH-ACELL PERTUSSIS 5-2.5-18.5 LF-MCG/0.5 IM SUSP
0.5000 mL | Freq: Once | INTRAMUSCULAR | Status: AC
  Administered 2019-07-18: 0.5 mL via INTRAMUSCULAR
  Filled 2019-07-18: qty 0.5

## 2019-07-18 NOTE — ED Notes (Signed)
Pt on monitor 

## 2019-07-18 NOTE — ED Triage Notes (Signed)
Pt brought in by family   Pt states he was in the bathroom and fell  He states he just lost his balance and fell  Pt has a laceration to the left side of his forehead and another to his left hand  Pt states he does not take blood thinners  Denies LOC  Family states right after the fall he was incoherent and not saying anything that made sense  Family states that lasted about 5-10 minutes

## 2019-07-18 NOTE — ED Notes (Signed)
Family at bedside. PT to CT scan.

## 2019-07-19 MED ORDER — BACITRACIN ZINC 500 UNIT/GM EX OINT: 1.0000 "application " | TOPICAL_OINTMENT | Freq: Two times a day (BID) | CUTANEOUS | 0 refills | Status: AC

## 2019-07-19 MED ORDER — BACITRACIN ZINC 500 UNIT/GM EX OINT
TOPICAL_OINTMENT | Freq: Two times a day (BID) | CUTANEOUS | Status: DC
  Administered 2019-07-19: 1 via TOPICAL

## 2019-07-19 NOTE — ED Provider Notes (Signed)
[facility] EMERGENCY DEPARTMENT Provider Note   CSN: [id number] Arrival date & time: 07/18/19  2242     History Chief Complaint  Patient presents with  . Fall    [name] is a 86 y.o. male.  HPI    86 year old male with history of Parkinson's disease comes in a chief complaint of fall. Patient reports that he fell down while going to the bathroom.  He struck his head to the tile, and bled a fair amount.  He is also complaining of bleeding from his hand.  Patient is not on any blood thinners.  Review of system is negative for any extremity pain, chest pain, shortness of breath, hip pain.  Patient was having difficulty expressing himself initially after the fall per family member, but now he is back to baseline self.  Past Medical History:  Diagnosis Date  . Allergic rhinitis   . Anxiety   . Bladder cancer [facility])    urologist-  dr eskridge--  low grade TA  . BPH with elevated PSA   . Complication of anesthesia    "paranoid after back surgery in october lasted 2 to 3 days"  . DDD (degenerative disc disease), lumbar   . Depression   . Dry mouth    USES BIOTIN SPRAY/ MOUTHWASH  . Frequency of urination   . GERD (gastroesophageal reflux disease)   . Hearing loss    does not wear his hearing aid  . History of kidney stones    10/1989  . Hyperlipidemia   . Hypertension   . Mild obstructive sleep apnea    PER PT  MILD OSA , NO CPAP RX,  STUDY DONE 2009  . Nocturia   . OAB (overactive bladder)   . Osteoarthritis    KNEES, SHOULDER  . Parkinson disease ([place]) DX   2005   NEUROLOGIST-   DR TAT--  IDIOPATHIC PARKINSON'S /  TREMORS CONTROLLED WITH MEDS  . Urge urinary incontinence     Patient Active Problem List   Diagnosis Date Noted  . BPH (benign prostatic hyperplasia) 08/21/2018  . OAB (overactive bladder) 08/21/2018  . S/P lumbar laminectomy 02/05/2017  . PCP NOTES >>>>>>>>>>>>>>>>>> 11/19/2015  . Dementia due to Parkinson's disease without behavioral  disturbance ([place]) 11/07/2015  . PD (Parkinson's disease) ([place]) 11/07/2015  . Bladder cancer ([place]) 04/26/2014  . Right inguinal hernia 09/10/2011  . Direct inguinal hernia 08/07/2011  . Urinary frequency 03/18/2011  . Skin lesion 03/18/2011  . General medical examination 03/18/2011  . Fatigue 10/14/2010  . RHINITIS 02/12/2010  . GERD 12/03/2009  . Depression 08/28/2009  . WEIGHT LOSS 08/28/2009  . PSA, INCREASED 06/11/2009  . Back pain 01/09/2009  . NEOPLASM OF UNCERTAIN BEHAVIOR OF SKIN 07/05/2008  . Hyperlipidemia 02/28/2007  . ERECTILE DYSFUNCTION 02/28/2007  . Essential hypertension 02/28/2007  . Osteoarthritis 02/28/2007    Past Surgical History:  Procedure Laterality Date  . CATARACT EXTRACTION W/ INTRAOCULAR LENS  IMPLANT, BILATERAL  2014  . CYSTOSCOPY Bilateral 04/09/2017   Procedure: CYSTOSCOPY/ RETROGRADE;  Surgeon: Festus Aloe, MD;  Location: [facility];  Service: Urology;  Laterality: Bilateral;  . CYSTOSCOPY W/ RETROGRADES Bilateral 12/14/2014   Procedure: CYSTOSCOPY BLADDER BIOPSY FULGERATION MITOMYCIN C BILATERAL RETROGRADE PYELOGRAM;  Surgeon: Festus Aloe, MD;  Location: [facility];  Service: Urology;  Laterality: Bilateral;  . CYSTOSCOPY WITH STENT PLACEMENT Left 12/19/2013   Procedure: CYSTOSCOPY BLADDER BX, LEFT URETERAL STENT PLACEMENT , LEFT RETROGRADE AND [id number];  Surgeon: Festus Aloe, MD;  Location: [facility];  Service: Urology;  Laterality: Left;  . EYE SURGERY Bilateral    ioc for cataract  . HERNIA REPAIR  2013   inguinal  . INGUINAL HERNIA REPAIR  03/09/2012   Procedure: HERNIA REPAIR INGUINAL ADULT;  Surgeon: Adin Hector, MD;  Location: [facility];  Service: General;  Laterality: Right;  . INSERTION OF MESH  03/09/2012   Procedure: INSERTION OF MESH;  Surgeon: Adin Hector, MD;  Location: [facility];  Service: General;  Laterality: Right;  . LUMBAR LAMINECTOMY/DECOMPRESSION MICRODISCECTOMY N/A 02/05/2017    Procedure: Laminectomy and Foraminotomy - Lumbar One-Two,Lumbar Two-Three, Lumbar Three-Four.;  Surgeon: Eustace Moore, MD;  Location: [facility];  Service: Neurosurgery;  Laterality: N/A;  . NASAL SINUS SURGERY  1982  . ORIF RIGHT ARM FX  1949   HARDWARE REMOVED   . REMOVAL BENIGN CYST LEFT FOREHEAD  1969  . TRANSURETHRAL RESECTION OF BLADDER TUMOR N/A 12/19/2013   Procedure:  TRANSURETHRAL RESECTION OF BLADDER TUMOR WITH GYRUS (TURBT-GYRUS);  Surgeon: Festus Aloe, MD;  Location[facility];  Service: Urology;  Laterality: N/A;  . TRANSURETHRAL RESECTION OF BLADDER TUMOR N/A 04/09/2017   Procedure: TRANSURETHRAL RESECTION OF BLADDER TUMOR / (TURBT) 2-5cm;  Surgeon: Festus Aloe, MD;  Location: [facility];  Service: Urology;  Laterality: N/A;  ONLY NEEDS 90 MIN FOR BOTH PROCEDURES       Family History  Problem Relation Age of Onset  . Alzheimer's disease Mother   . Liver cancer Father   . Cancer Father        colon  . Healthy Daughter   . Diabetes Son   . Prostate cancer Neg Hx   . CAD Neg Hx     Social History   Tobacco Use  . Smoking status: Former Smoker    Packs/day: 1.00    Years: 15.00    Pack years: 15.00    Types: Cigarettes    Quit date: 04/20/1968    Years since quitting: 51.2  . Smokeless tobacco: Never Used  Substance Use Topics  . Alcohol use: Yes    Comment: occassional glass of wine   . Drug use: No    Home Medications Prior to Admission medications   Medication Sig Start Date End Date Taking? Authorizing Provider  acetaminophen (TYLENOL) 500 MG tablet Take 500-1,000 mg by mouth See admin instructions. 1000 mg in the morning, 500 mg at noon, 500 mg at 1600, and 1000 mg at night    [provider]  bacitracin ointment Apply 1 application topically 2 (two) times daily. 07/19/19   Varney Biles, MD  Ca Carbonate-Mag Hydroxide (ROLAIDS PO) Take 2 tablets by mouth as needed (indigestion).     [provider]    carbidopa-levodopa (SINEMET CR) 50-200 MG tablet TAKE 1 TABLET BY MOUTH EVERYDAY AT BEDTIME 06/30/19   Tat, Rebecca S, DO  carbidopa-levodopa (SINEMET IR) 25-100 MG tablet TAKE TWO TABLETS BY MOUTH FOUR TIMES DAILY 04/06/19   Tat, Eustace Quail, DO  docusate sodium (COLACE) 50 MG capsule Take 50-100 mg by mouth See admin instructions. Take 100 mg in the morning and 50 mg at 4pm    [provider]  donepezil (ARICEPT) 10 MG tablet Take 1 tablet (10 mg total) by mouth at bedtime. 06/15/19   Tat, Eustace Quail, DO  donepezil (ARICEPT) 5 MG tablet Take 1 tablet (5 mg total) by mouth at bedtime. 06/15/19   Tat, Eustace Quail, DO  fexofenadine (ALLEGRA) 180 MG  tablet Take 180 mg by mouth daily. At 1600    [provider]  fluticasone (FLONASE) 50 MCG/ACT nasal spray Place 2 sprays into both nostrils daily as needed for allergies or rhinitis. 12/29/18   Colon Branch, MD  ibuprofen (ADVIL,MOTRIN) 200 MG tablet Take 400 mg by mouth every 8 (eight) hours as needed.    [provider]  losartan (COZAAR) 100 MG tablet TAKE 1 TABLET (100 MG TOTAL) BY MOUTH DAILY AT 12 NOON. 06/30/19   Colon Branch, MD  Multiple Vitamins-Minerals (MULTIVITAMIN ADULT PO) Take by mouth daily.    [provider]  MYRBETRIQ 25 MG TB24 tablet Take 50 mg by mouth daily. At 1600 02/24/16   [provider]  polyethylene glycol (MIRALAX / GLYCOLAX) packet Take 17 g by mouth daily as needed for mild constipation.     [provider]  Polyvinyl Alcohol-Povidone (REFRESH OP) Place 1-2 drops into both eyes daily as needed (dry eyes).    [provider]  Probiotic Product (PROBIOTIC DAILY PO) Take 1 capsule by mouth daily.    [provider]  sertraline (ZOLOFT) 100 MG tablet TAKE 1 TABLET EVERY DAY 04/05/19   Colon Branch, MD  tamsulosin (FLOMAX) 0.4 MG CAPS capsule Take 0.4 mg by mouth daily after supper.  05/01/16   [provider]  tolterodine (DETROL) 2 MG tablet Take 2 mg by  mouth at bedtime.  11/02/18   [provider]  vitamin E 100 UNIT capsule Take by mouth as needed.    [provider]    Allergies    Pollen extract and Other  Review of Systems   Review of Systems  Constitutional: Positive for activity change.  Eyes: Negative for visual disturbance.  Respiratory: Negative for shortness of breath.   Cardiovascular: Negative for chest pain.  Gastrointestinal: Negative for nausea and vomiting.  Skin: Positive for wound.  Neurological: Positive for headaches.  Hematological: Does not bruise/bleed easily.  All other systems reviewed and are negative.   Physical Exam Updated Vital Signs BP (!) 132/53 (BP Location: Right Arm)   Pulse 74   Temp 97.8 F (36.6 C) (Oral)   Resp 19   Ht 5\' 11"  (1.803 m)   Wt 64.9 kg   SpO2 98%   BMI 19.94 kg/m   Physical Exam Vitals and nursing note reviewed.  Constitutional:      Appearance: He is well-developed.  HENT:     Head: Normocephalic and atraumatic.  Eyes:     Conjunctiva/sclera: Conjunctivae normal.     Pupils: Pupils are equal, round, and reactive to light.  Neck:     Comments: No midline c-spine tenderness, pt able to turn head to 45 degrees bilaterally without any pain and able to flex neck to the chest and extend without any pain or neurologic symptoms. Cardiovascular:     Rate and Rhythm: Normal rate and regular rhythm.  Pulmonary:     Effort: Pulmonary effort is normal.     Breath sounds: Normal breath sounds.  Abdominal:     General: Bowel sounds are normal. There is no distension.     Palpations: Abdomen is soft. There is no mass.     Tenderness: There is no abdominal tenderness. There is no guarding or rebound.  Musculoskeletal:        General: No deformity.     Cervical back: Normal range of motion and neck supple.     Comments: Patient has a 4 cm laceration  to the left side of the forehead and 3 cm flap-like lesion in his left palmar surface.  OTHERWISE: Head to  toe evaluation shows no other hematoma, bleeding of the scalp, no facial abrasions, no spine step offs, crepitus of the chest or neck, no tenderness to palpation of the bilateral upper and lower extremities, no gross deformities, no chest tenderness, no pelvic pain.   Skin:    General: Skin is warm.  Neurological:     Mental Status: He is alert and oriented to person, place, and time.     ED Results / Procedures / Treatments   Labs (all labs ordered are listed, but only abnormal results are displayed) Labs Reviewed - No data to display  EKG None  Radiology CT Head Wo Contrast  Result Date: 07/18/2019 CLINICAL DATA:  Fall with left frontal laceration EXAM: CT HEAD WITHOUT CONTRAST TECHNIQUE: Contiguous axial images were obtained from the base of the skull through the vertex without intravenous contrast. COMPARISON:  None. FINDINGS: Brain: No acute territorial infarction, hemorrhage, or intracranial mass. Moderate atrophy. Moderate hypodensity in the white matter consistent with chronic small vessel ischemic change. Mildly prominent ventricles felt secondary to atrophy. Vascular: No hyperdense vessels.  Carotid vascular calcification Skull: Normal. Negative for fracture or focal lesion. Sinuses/Orbits: Postsurgical changes of the maxillary and ethmoid sinuses. Residual mucosal thickening with calcification in left maxillary sinus with mild mucosal thickening in the ethmoid sinuses Other: Left frontal scalp hematoma. IMPRESSION: 1. No CT evidence for acute intracranial abnormality. 2. Atrophy and chronic small vessel ischemic change of the white matter Electronically Signed   By: Donavan Foil M.D.   On: 07/18/2019 23:49    Procedures .Marland KitchenLaceration Repair  Date/Time: 07/19/2019 1:11 AM Performed by: Varney Biles, MD Authorized by: Varney Biles, MD   Consent:    Consent obtained:  Verbal   Consent given by:  Patient and spouse   Risks discussed:  Infection, pain, poor cosmetic  result, poor wound healing and need for additional repair   Alternatives discussed:  No treatment Universal protocol:    Procedure explained and questions answered to patient or proxy's satisfaction: yes     Imaging studies available: yes     Patient identity confirmed:  Arm band Laceration details:    Location:  Face   Face location:  Forehead   Length (cm):  7   Depth (mm):  5 Repair type:    Repair type:  Complex Pre-procedure details:    Preparation:  Patient was prepped and draped in usual sterile fashion Exploration:    Limited defect created (wound extended): no     Wound exploration: wound explored through full range of motion     Wound extent: fascia violated   Treatment:    Area cleansed with:  Saline   Amount of cleaning:  Standard   Irrigation solution:  Sterile saline   Irrigation volume:  50   Irrigation method:  Pressure wash   Debridement:  Minimal   Undermining:  Minimal Skin repair:    Repair method:  Sutures   Suture size:  5-0   Suture material:  Nylon   Suture technique:  Simple interrupted   Number of sutures:  7 Approximation:    Approximation:  Close Post-procedure details:    Dressing:  Antibiotic ointment and sterile dressing   Patient tolerance of procedure:  Tolerated well, no immediate complications Comments:     4 stitches were placed to the forehead and 3 over the hand to close  the flap wound.   (including critical care time)  Medications Ordered in ED Medications  bacitracin ointment (1 application Topical Given 07/19/19 0106)  lidocaine-EPINEPHrine (XYLOCAINE W/EPI) 2 %-1:200000 (PF) injection 20 mL (20 mLs Infiltration Given 07/18/19 2348)  Tdap (BOOSTRIX) injection 0.5 mL (0.5 mLs Intramuscular Given 07/18/19 2347)    ED Course  I have reviewed the triage vital signs and the nursing notes.  Pertinent labs & imaging results that were available during my care of the patient were reviewed by me and considered in my medical decision  making (see chart for details).    MDM Rules/Calculators/A&P                      DDx includes: - Mechanical falls - ICH - Fractures - Contusions - Soft tissue injury  Patient comes in a chief complaint of fall.  He is noted to have bleeding from his head and his left hand.  Neurovascularly intact.  C-spine cleared clinically.  Patient denies any chest pain, shortness of breath, abdominal pain and his MS exam besides the injured hand and forehead is normal.  Wound sutured without any difficulty.  Final Clinical Impression(s) / ED Diagnoses Final diagnoses:  Fall in home, initial encounter  Laceration of head without foreign body, unspecified part of head, initial encounter  Traumatic brain injury, without loss of consciousness, initial encounter [facility])    Rx / DC Orders ED Discharge Orders         Ordered    bacitracin ointment  2 times daily     07/19/19 0107           Varney Biles, MD 07/19/19 [phone]

## 2019-07-19 NOTE — Discharge Instructions (Addendum)
We saw you in the ER after your fall. You had 2 WOUNDS that were repaired in the ER. Please read the instructions provided on wound care. Keep the area clean and dry, apply bacitracin ointment daily and take the medications provided. RETURN TO THE ER IF THERE IS INCREASED PAIN, REDNESS, PUS COMING OUT from the wound site.

## 2019-07-26 ENCOUNTER — Other Ambulatory Visit: Payer: Self-pay

## 2019-07-26 ENCOUNTER — Encounter: Payer: Self-pay | Admitting: Internal Medicine

## 2019-07-26 ENCOUNTER — Ambulatory Visit: Payer: Medicare Other | Admitting: Internal Medicine

## 2019-07-26 VITALS — BP 124/50 | HR 76 | Temp 97.8°F | Resp 16 | Ht 71.0 in | Wt 142.1 lb

## 2019-07-26 DIAGNOSIS — R55 Syncope and collapse: Secondary | ICD-10-CM | POA: Diagnosis not present

## 2019-07-26 NOTE — Progress Notes (Signed)
Pre visit review using our clinic review tool, if applicable. No additional management support is needed unless otherwise documented below in the visit note. 

## 2019-07-26 NOTE — Progress Notes (Signed)
Subjective:    Patient ID: [name], male    DOB: 1934-03-17, 86 y.o.   MRN: [id number]  DOS:  07/26/2019 Type of visit - description: ER follow-up  Went to the ER 07/18/2019 after a fall, here for a follow-up. The patient and daughter tell me that he actually has fallen  3 falls in the last few days, only one required the ER visit. He said that he simply "blacks out" briefly and ends in the floor. Does not recall if he tripped and fell.  ER note reviewed: CT head no acute findings, had a tetanus shot, had a laceration repair. Forehead 4 stitches and hand 3 stitches   Review of Systems Currently with no particular headache, neck or hip pain. Denies chest pain, difficulty breathing or palpitations No nausea, abdominal pain or blood in the stools No clear-cut stroke symptoms such as droopy face, speech is always a slow and difficult but nothing new.  Past Medical History:  Diagnosis Date  . Allergic rhinitis   . Anxiety   . Bladder cancer [facility])    urologist-  dr eskridge--  low grade TA  . BPH with elevated PSA   . Complication of anesthesia    "paranoid after back surgery in october lasted 2 to 3 days"  . DDD (degenerative disc disease), lumbar   . Depression   . Dry mouth    USES BIOTIN SPRAY/ MOUTHWASH  . Frequency of urination   . GERD (gastroesophageal reflux disease)   . Hearing loss    does not wear his hearing aid  . History of kidney stones    10/1989  . Hyperlipidemia   . Hypertension   . Mild obstructive sleep apnea    PER PT  MILD OSA , NO CPAP RX,  STUDY DONE 2009  . Nocturia   . OAB (overactive bladder)   . Osteoarthritis    KNEES, SHOULDER  . Parkinson disease ([place]) DX   2005   NEUROLOGIST-   DR TAT--  IDIOPATHIC PARKINSON'S /  TREMORS CONTROLLED WITH MEDS  . Urge urinary incontinence     Past Surgical History:  Procedure Laterality Date  . CATARACT EXTRACTION W/ INTRAOCULAR LENS  IMPLANT, BILATERAL  2014  . CYSTOSCOPY Bilateral 04/09/2017   Procedure: CYSTOSCOPY/ RETROGRADE;  Surgeon: Festus Aloe, MD;  Location: [facility];  Service: Urology;  Laterality: Bilateral;  . CYSTOSCOPY W/ RETROGRADES Bilateral 12/14/2014   Procedure: CYSTOSCOPY BLADDER BIOPSY FULGERATION MITOMYCIN C BILATERAL RETROGRADE PYELOGRAM;  Surgeon: Festus Aloe, MD;  Location: [facility];  Service: Urology;  Laterality: Bilateral;  . CYSTOSCOPY WITH STENT PLACEMENT Left 12/19/2013   Procedure: CYSTOSCOPY BLADDER BX, LEFT URETERAL STENT PLACEMENT , LEFT RETROGRADE AND PYLOGRAM;  Surgeon: Festus Aloe, MD;  Location: [facility];  Service: Urology;  Laterality: Left;  . EYE SURGERY Bilateral    ioc for cataract  . HERNIA REPAIR  2013   inguinal  . INGUINAL HERNIA REPAIR  03/09/2012   Procedure: HERNIA REPAIR INGUINAL ADULT;  Surgeon: Adin Hector, MD;  [facility];  Service: General;  Laterality: Right;  . INSERTION OF MESH  03/09/2012   Procedure: INSERTION OF MESH;  Surgeon: Adin Hector, MD;  Location: [facility];  Service: General;  Laterality: Right;  . LUMBAR LAMINECTOMY/DECOMPRESSION MICRODISCECTOMY N/A 02/05/2017   Procedure: Laminectomy and Foraminotomy - Lumbar One-Two,Lumbar Two-Three, Lumbar Three-Four.;  Surgeon: Eustace Moore, MD;  Location: [place];  Service: Neurosurgery;  Laterality: N/A;  . NASAL  SINUS SURGERY  1982  . ORIF RIGHT ARM FX  1949   HARDWARE REMOVED   . REMOVAL BENIGN CYST LEFT FOREHEAD  1969  . TRANSURETHRAL RESECTION OF BLADDER TUMOR N/A 12/19/2013   Procedure:  TRANSURETHRAL RESECTION OF BLADDER TUMOR WITH GYRUS (TURBT-GYRUS);  Surgeon: Festus Aloe, MD;  Location: [facility];  Service: Urology;  Laterality: N/A;  . TRANSURETHRAL RESECTION OF BLADDER TUMOR N/A 04/09/2017   Procedure: TRANSURETHRAL RESECTION OF BLADDER TUMOR / (TURBT) 2-5cm;  Surgeon: Festus Aloe, MD;  [facility];  Service: Urology;  Laterality: N/A;  ONLY NEEDS 90 MIN FOR BOTH PROCEDURES      Allergies as of 07/26/2019      Reactions   Pollen Extract Other (See Comments)   Other Other (See Comments)   Paranoia from anesthesia drug last visit-unknown drug       Medication List       Accurate as of July 26, 2019  3:48 PM. If you have any questions, ask your nurse or doctor.        acetaminophen 500 MG tablet Commonly known as: TYLENOL Take 500-1,000 mg by mouth See admin instructions. 1000 mg in the morning, 500 mg at noon, 500 mg at 1600, and 1000 mg at night   bacitracin ointment Apply 1 application topically 2 (two) times daily.   carbidopa-levodopa 25-100 MG tablet Commonly known as: SINEMET IR TAKE TWO TABLETS BY MOUTH FOUR TIMES DAILY   carbidopa-levodopa 50-200 MG tablet Commonly known as: SINEMET CR TAKE 1 TABLET BY MOUTH EVERYDAY AT BEDTIME   docusate sodium 50 MG capsule Commonly known as: COLACE Take 50-100 mg by mouth See admin instructions. Take 100 mg in the morning and 50 mg at 4pm   donepezil 5 MG tablet Commonly known as: ARICEPT Take 1 tablet (5 mg total) by mouth at bedtime.   donepezil 10 MG tablet Commonly known as: ARICEPT Take 1 tablet (10 mg total) by mouth at bedtime.   fexofenadine 180 MG tablet Commonly known as: ALLEGRA Take 180 mg by mouth daily. At 1600   fluticasone 50 MCG/ACT nasal spray Commonly known as: FLONASE Place 2 sprays into both nostrils daily as needed for allergies or rhinitis.   ibuprofen 200 MG tablet Commonly known as: ADVIL Take 400 mg by mouth every 8 (eight) hours as needed.   losartan 100 MG tablet Commonly known as: COZAAR TAKE 1 TABLET (100 MG TOTAL) BY MOUTH DAILY AT 12 NOON.   MULTIVITAMIN ADULT PO Take by mouth daily.   Myrbetriq 25 MG Tb24 tablet Generic drug: mirabegron ER Take 50 mg by mouth daily. At 1600   polyethylene glycol 17 g packet Commonly known as: MIRALAX / GLYCOLAX Take 17 g by mouth daily as needed for mild constipation.   PROBIOTIC DAILY PO Take 1 capsule by  mouth daily.   REFRESH OP Place 1-2 drops into both eyes daily as needed (dry eyes).   ROLAIDS PO Take 2 tablets by mouth as needed (indigestion).   sertraline 100 MG tablet Commonly known as: ZOLOFT TAKE 1 TABLET EVERY DAY   tamsulosin 0.4 MG Caps capsule Commonly known as: FLOMAX Take 0.4 mg by mouth daily after supper.   tolterodine 2 MG tablet Commonly known as: DETROL Take 2 mg by mouth at bedtime.   VESIcare 10 MG tablet Generic drug: solifenacin Take 10 mg by mouth daily.   vitamin E 45 MG (100 UNITS) capsule Take by mouth as needed.  Objective:   Physical Exam BP (!) 124/50 (BP Location: Left Arm, Patient Position: Sitting, Cuff Size: Small)   Pulse 76   Temp 97.8 F (36.6 C) (Temporal)   Resp 16   Ht 5\' 11"  (1.803 m)   Wt 142 lb 2 oz (64.5 kg)   SpO2 99%   BMI 19.82 kg/m  General:   Well developed, NAD, BMI noted. HEENT:  Normocephalic . Face symmetric Lungs:  CTA B Normal respiratory effort, no intercostal retractions, no accessory muscle use. Heart: RRR,  no murmur.  Lower extremities: no pretibial edema bilaterally  Skin:  - has a laceration at the left side of the forehead.  No redness or discharge noted. -Laceration at the of the left hand, wound does not seem completely healed.  No redness or discharge Neurologic:  alert & oriented X3.  Speech is slow, face is masked, gait not tested, requires help transferring to the table. Psych--  Cognition and judgment appear intact.  Cooperative with normal attention span and concentration.  Behavior appropriate. No anxious or depressed appearing.      Assessment     Assessment HTN Hyperlipidemia Depression - anxiety: Lexapro intolerant 10-2016 OSA, mild per remote sleep study (~2008?), no CPAP Parkinson disease Dr. Carles Collet Dementia, mild GU: Dr. Junious Silk --BPH, elevated PSA --Bladder cancer-low-grade --OAB, incontinence  -- E.D. Dysphagia, MBE 09-2013 normal Dry mouth  DJD- back  pain, sees Dr Nelva Bush, s/p local injections, on prn hydrocodone, surgery 2018 L > R LE edema: [place] (-) DVT 04-2016 HOH   PLAN: Suture removal: 4 sutures were removed from the left forehead without any problems. We will come back next week for the removal of the sutures at the left palm. Falls: See next. Syncope?  The patient had 3 falls in the last 10 days, only one required ER visit, CT head no acute. Not clear to me if these are truly mechanical falls, the patient reports that he simply blacks out temporarily and find himself in the floor. DDx is large but includes arrhythmias, orthostatic hypotension with history of Parkinson, and many other conditions. EKG today: Sinus rhythm, frequent PACs. Plan: -Our lab is closed today, he will come back in 5 days for hand suture removal and labs CMP, CBC, FLP. -To prevent falls recommend not to walk unless he is supervised. -Cardiology referral, arrhythmia? RTC next week     This visit occurred during the SARS-CoV-2 public health emergency.  Safety protocols were in place, including screening questions prior to the visit, additional usage of staff PPE, and extensive cleaning of exam room while observing appropriate contact time as indicated for disinfecting solutions.

## 2019-07-26 NOTE — Patient Instructions (Addendum)
We are referring you to cardiology  Please use your walker at all times and do not walk unsupervised  [place], [place] back for a checkup in 5 days (Monday)

## 2019-07-27 NOTE — Assessment & Plan Note (Signed)
Suture removal: 4 sutures were removed from the left forehead without any problems. We will come back next week for the removal of the sutures at the left palm. Falls: See next. Syncope?  The patient had 3 falls in the last 10 days, only one required ER visit, CT head no acute. Not clear to me if these are truly mechanical falls, the patient reports that he simply blacks out temporarily and find himself in the floor. DDx is large but includes arrhythmias, orthostatic hypotension with history of Parkinson, and many other conditions. EKG today: Sinus rhythm, frequent PACs. Plan: -Our lab is closed today, he will come back in 5 days for hand suture removal and labs CMP, CBC, FLP. -To prevent falls recommend not to walk unless he is supervised. -Cardiology referral, arrhythmia? RTC next week

## 2019-07-31 ENCOUNTER — Encounter: Payer: Self-pay | Admitting: Internal Medicine

## 2019-07-31 ENCOUNTER — Ambulatory Visit: Payer: Medicare Other | Admitting: Internal Medicine

## 2019-07-31 ENCOUNTER — Other Ambulatory Visit: Payer: Self-pay

## 2019-07-31 VITALS — BP 116/43 | HR 72 | Temp 97.5°F | Resp 18 | Ht 71.0 in | Wt 142.1 lb

## 2019-07-31 DIAGNOSIS — E785 Hyperlipidemia, unspecified: Secondary | ICD-10-CM

## 2019-07-31 DIAGNOSIS — R55 Syncope and collapse: Secondary | ICD-10-CM | POA: Diagnosis not present

## 2019-07-31 DIAGNOSIS — I10 Essential (primary) hypertension: Secondary | ICD-10-CM | POA: Diagnosis not present

## 2019-07-31 NOTE — Progress Notes (Signed)
Subjective:    Patient ID: [name], male    DOB: 30-Sep-1933, 86 y.o.   MRN: [id number]  DOS:  07/31/2019 Type of visit - description: Follow-up from previous visit Here for suture removal.  We also talk about his episodes of syncope. Denies fever chills. No redness or discharge from the left palm injury.   Review of Systems See above   Past Medical History:  Diagnosis Date  . Allergic rhinitis   . Anxiety   . Bladder cancer [facility])    urologist-  dr eskridge--  low grade TA  . BPH with elevated PSA   . Complication of anesthesia    "paranoid after back surgery in october lasted 2 to 3 days"  . DDD (degenerative disc disease), lumbar   . Depression   . Dry mouth    USES BIOTIN SPRAY/ MOUTHWASH  . Frequency of urination   . GERD (gastroesophageal reflux disease)   . Hearing loss    does not wear his hearing aid  . History of kidney stones    10/1989  . Hyperlipidemia   . Hypertension   . Mild obstructive sleep apnea    PER PT  MILD OSA , NO CPAP RX,  STUDY DONE 2009  . Nocturia   . OAB (overactive bladder)   . Osteoarthritis    KNEES, SHOULDER  . Parkinson disease ([place]) DX   2005   NEUROLOGIST-   DR TAT--  IDIOPATHIC PARKINSON'S /  TREMORS CONTROLLED WITH MEDS  . Urge urinary incontinence     Past Surgical History:  Procedure Laterality Date  . CATARACT EXTRACTION W/ INTRAOCULAR LENS  IMPLANT, BILATERAL  2014  . CYSTOSCOPY Bilateral 04/09/2017   Procedure: CYSTOSCOPY/ RETROGRADE;  Surgeon: Festus Aloe, MD;  [facility];  Service: Urology;  Laterality: Bilateral;  . CYSTOSCOPY W/ RETROGRADES Bilateral 12/14/2014   Procedure: CYSTOSCOPY BLADDER BIOPSY FULGERATION MITOMYCIN C BILATERAL RETROGRADE PYELOGRAM;  Surgeon: Festus Aloe, MD;  Location: [facility];  Service: Urology;  Laterality: Bilateral;  . CYSTOSCOPY WITH STENT PLACEMENT Left 12/19/2013   Procedure: CYSTOSCOPY BLADDER BX, LEFT URETERAL STENT PLACEMENT , LEFT RETROGRADE AND  PYLOGRAM;  Surgeon: Festus Aloe, MD;  Location[facility];  Service: Urology;  Laterality: Left;  . EYE SURGERY Bilateral    ioc for cataract  . HERNIA REPAIR  2013   inguinal  . INGUINAL HERNIA REPAIR  03/09/2012   Procedure: HERNIA REPAIR INGUINAL ADULT;  Surgeon: Adin Hector, MD;  [facility];  Service: General;  Laterality: Right;  . INSERTION OF MESH  03/09/2012   Procedure: INSERTION OF MESH;  Surgeon: Adin Hector, MD;  [facility];  Service: General;  Laterality: Right;  . LUMBAR LAMINECTOMY/DECOMPRESSION MICRODISCECTOMY N/A 02/05/2017   Procedure: Laminectomy and Foraminotomy - Lumbar One-Two,Lumbar Two-Three, Lumbar Three-Four.;  Surgeon: Eustace Moore, MD;  Location: [place];  Service: Neurosurgery;  Laterality: N/A;  . NASAL SINUS SURGERY  1982  . ORIF RIGHT ARM FX  1949   HARDWARE REMOVED   . REMOVAL BENIGN CYST LEFT FOREHEAD  1969  . TRANSURETHRAL RESECTION OF BLADDER TUMOR N/A 12/19/2013   Procedure:  TRANSURETHRAL RESECTION OF BLADDER TUMOR WITH GYRUS (TURBT-GYRUS);  Surgeon: Festus Aloe, MD;  Location: [facility];  Service: Urology;  Laterality: N/A;  . TRANSURETHRAL RESECTION OF BLADDER TUMOR N/A 04/09/2017   Procedure: TRANSURETHRAL RESECTION OF BLADDER TUMOR / (TURBT) 2-5cm;  Surgeon: Festus Aloe, MD;  [facility];  Service:  Urology;  Laterality: N/A;  ONLY NEEDS 90 MIN FOR BOTH PROCEDURES    Allergies as of 07/31/2019      Reactions   Pollen Extract Other (See Comments)   Other Other (See Comments)   Paranoia from anesthesia drug last visit-unknown drug       Medication List       Accurate as of July 31, 2019 11:59 PM. If you have any questions, ask your nurse or doctor.        STOP taking these medications   tolterodine 2 MG tablet Commonly known as: DETROL Stopped by: [name], MD     TAKE these medications   acetaminophen 500 MG tablet Commonly known as: TYLENOL Take 500-1,000 mg  by mouth See admin instructions. 1000 mg in the morning, 500 mg at noon, 500 mg at 1600, and 1000 mg at night   bacitracin ointment Apply 1 application topically 2 (two) times daily.   carbidopa-levodopa 25-100 MG tablet Commonly known as: SINEMET IR TAKE TWO TABLETS BY MOUTH FOUR TIMES DAILY   carbidopa-levodopa 50-200 MG tablet Commonly known as: SINEMET CR TAKE 1 TABLET BY MOUTH EVERYDAY AT BEDTIME   docusate sodium 50 MG capsule Commonly known as: COLACE Take 50-100 mg by mouth See admin instructions. Take 100 mg in the morning and 50 mg at 4pm   donepezil 5 MG tablet Commonly known as: ARICEPT Take 1 tablet (5 mg total) by mouth at bedtime.   donepezil 10 MG tablet Commonly known as: ARICEPT Take 1 tablet (10 mg total) by mouth at bedtime.   fexofenadine 180 MG tablet Commonly known as: ALLEGRA Take 180 mg by mouth daily. At 1600   fluticasone 50 MCG/ACT nasal spray Commonly known as: FLONASE Place 2 sprays into both nostrils daily as needed for allergies or rhinitis.   ibuprofen 200 MG tablet Commonly known as: ADVIL Take 400 mg by mouth every 8 (eight) hours as needed.   losartan 100 MG tablet Commonly known as: COZAAR TAKE 1 TABLET (100 MG TOTAL) BY MOUTH DAILY AT 12 NOON.   MULTIVITAMIN ADULT PO Take by mouth daily.   Myrbetriq 25 MG Tb24 tablet Generic drug: mirabegron ER Take 50 mg by mouth daily. At 1600   polyethylene glycol 17 g packet Commonly known as: MIRALAX / GLYCOLAX Take 17 g by mouth daily as needed for mild constipation.   PROBIOTIC DAILY PO Take 1 capsule by mouth daily.   REFRESH OP Place 1-2 drops into both eyes daily as needed (dry eyes).   ROLAIDS PO Take 2 tablets by mouth as needed (indigestion).   sertraline 100 MG tablet Commonly known as: ZOLOFT TAKE 1 TABLET EVERY DAY   tamsulosin 0.4 MG Caps capsule Commonly known as: FLOMAX Take 0.4 mg by mouth daily after supper.   VESIcare 10 MG tablet Generic drug:  solifenacin Take 10 mg by mouth daily.   vitamin E 45 MG (100 UNITS) capsule Take by mouth as needed.          Objective:   Physical Exam BP (!) 116/43 (BP Location: Right Arm, Patient Position: Sitting, Cuff Size: Small)   Pulse 72   Temp (!) 97.5 F (36.4 C) (Temporal)   Resp 18   Ht 5\' 11"  (1.803 m)   Wt 142 lb 2 oz (64.5 kg)   SpO2 100%   BMI 19.82 kg/m  General:   Well developed, NAD, BMI noted. HEENT:  Normocephalic . Face symmetric, atraumatic Skin: Left arm with a laceration healing  nicely.  Mild redness without warmness or discharge. Neurologic:  alert & oriented X3.  Speech normal, gait not tested Psych--  Cognition and judgment appear intact.  Cooperative with normal attention span and concentration.  Behavior appropriate. No anxious or depressed appearing.      Assessment      Assessment HTN Hyperlipidemia Depression - anxiety: Lexapro intolerant 10-2016 OSA, mild per remote sleep study (~2008?), no CPAP Parkinson disease Dr. Carles Collet Dementia, mild GU: Dr. Junious Silk --BPH, elevated PSA --Bladder cancer-low-grade --OAB, incontinence  -- E.D. Dysphagia, MBE 09-2013 normal Dry mouth  DJD- back pain, sees Dr Nelva Bush, s/p local injections, on prn hydrocodone, surgery 2018 L > R LE edema: [place] (-) DVT 04-2016 HOH   PLAN: Suture removal: 3 sutures removed from the left hand; recommend to let me know if he developed redness or swelling. Syncope?  Since the last visit had at least 1 other episode when he found himself on the floor, fortunately with no major consequence. Again is strongly advised not to be without supervision. He will see cardiology tomorrow, he asked me why he needed to see cardiology, explained  I like to be sure he does not have an arrhythmia which could cause his symptoms. If cardiology does not believe that is the case, will discuss with neurology. He verbalized understanding HTN: Check a CMP and CBC High cholesterol, check FLP RTC 4  months     This visit occurred during the SARS-CoV-2 public health emergency.  Safety protocols were in place, including screening questions prior to the visit, additional usage of staff PPE, and extensive cleaning of exam room while observing appropriate contact time as indicated for disinfecting solutions.

## 2019-07-31 NOTE — Progress Notes (Signed)
Cardiology Office Note:    Date:  08/01/2019   ID:  [name], DOB 1933/11/16, MRN [id number]  PCP:  Colon Branch, MD  Cardiologist:  No primary care provider on file.  Electrophysiologist:  None   Referring MD: Colon Branch, MD   Chief Complaint  Patient presents with  . New Patient (Initial Visit)  . Loss of Consciousness    History of Present Illness:    [name] is a 86 y.o. male with a hx of bladder cancer, hypertension, hyperlipidemia, OSA, Parkinson's disease who is referred by Dr. Larose Kells for evaluation of syncope.  Went to the ED on 07/18/2019 after a fall.  Reports that he blacked out briefly and ended up on the floor.  He required 4 stitches in his forehead and 3 in his hand.  His wife reports that he has had 4-5 falls in the last 10 days, associated with loss of consciousness.  States that he will suddenly lose consciousness.  No prodromal symptoms.  No confusion afterwards.  Reports only unconscious for a few seconds.  Episodes seem to occur shortly after position change.  Denies any chest pain or palpitations.  Does report he gets some dyspnea on exertion, particularly walking up and down stairs.    Past Medical History:  Diagnosis Date  . Allergic rhinitis   . Anxiety   . Bladder cancer [facility])    urologist-  dr eskridge--  low grade TA  . BPH with elevated PSA   . Complication of anesthesia    "paranoid after back surgery in october lasted 2 to 3 days"  . DDD (degenerative disc disease), lumbar   . Depression   . Dry mouth    USES BIOTIN SPRAY/ MOUTHWASH  . Frequency of urination   . GERD (gastroesophageal reflux disease)   . Hearing loss    does not wear his hearing aid  . History of kidney stones    10/1989  . Hyperlipidemia   . Hypertension   . Mild obstructive sleep apnea    PER PT  MILD OSA , NO CPAP RX,  STUDY DONE 2009  . Nocturia   . OAB (overactive bladder)   . Osteoarthritis    KNEES, SHOULDER  . Parkinson disease ([place]) DX   2005    NEUROLOGIST-   DR TAT--  IDIOPATHIC PARKINSON'S /  TREMORS CONTROLLED WITH MEDS  . Urge urinary incontinence     Past Surgical History:  Procedure Laterality Date  . CATARACT EXTRACTION W/ INTRAOCULAR LENS  IMPLANT, BILATERAL  2014  . CYSTOSCOPY Bilateral 04/09/2017   Procedure: CYSTOSCOPY/ RETROGRADE;  Surgeon: Festus Aloe, MD;  Location: [facility];  Service: Urology;  Laterality: Bilateral;  . CYSTOSCOPY W/ RETROGRADES Bilateral 12/14/2014   Procedure: CYSTOSCOPY BLADDER BIOPSY FULGERATION MITOMYCIN C BILATERAL RETROGRADE PYELOGRAM;  Surgeon: Festus Aloe, MD;  Location: [facility];  Service: Urology;  Laterality: Bilateral;  . CYSTOSCOPY WITH STENT PLACEMENT Left 12/19/2013   Procedure: CYSTOSCOPY BLADDER BX, LEFT URETERAL STENT PLACEMENT , LEFT RETROGRADE AND PYLOGRAM;  Surgeon: Festus Aloe, MD;  Location[facility];  Service: Urology;  Laterality: Left;  . EYE SURGERY Bilateral    ioc for cataract  . HERNIA REPAIR  2013   inguinal  . INGUINAL HERNIA REPAIR  03/09/2012   Procedure: HERNIA REPAIR INGUINAL ADULT;  Surgeon: Adin Hector, MD;  Location: [facility];  Service: General;  Laterality: Right;  . INSERTION OF MESH  03/09/2012   Procedure: INSERTION  OF MESH;  Surgeon: Adin Hector, MD;  Location: [facility];  Service: General;  Laterality: Right;  . LUMBAR LAMINECTOMY/DECOMPRESSION MICRODISCECTOMY N/A 02/05/2017   Procedure: Laminectomy and Foraminotomy - Lumbar One-Two,Lumbar Two-Three, Lumbar Three-Four.;  Surgeon: Eustace Moore, MD;  Location: [place];  Service: Neurosurgery;  Laterality: N/A;  . NASAL SINUS SURGERY  1982  . ORIF RIGHT ARM FX  1949   HARDWARE REMOVED   . REMOVAL BENIGN CYST LEFT FOREHEAD  1969  . TRANSURETHRAL RESECTION OF BLADDER TUMOR N/A 12/19/2013   Procedure:  TRANSURETHRAL RESECTION OF BLADDER TUMOR WITH GYRUS (TURBT-GYRUS);  Surgeon: Festus Aloe, MD;  Location: [facility];  Service: Urology;   Laterality: N/A;  . TRANSURETHRAL RESECTION OF BLADDER TUMOR N/A 04/09/2017   Procedure: TRANSURETHRAL RESECTION OF BLADDER TUMOR / (TURBT) 2-5cm;  Surgeon: Festus Aloe, MD;  Location: [facility];  Service: Urology;  Laterality: N/A;  ONLY NEEDS 90 MIN FOR BOTH PROCEDURES    Current Medications: No outpatient medications have been marked as taking for the 08/01/19 encounter (Office Visit) with Donato Heinz, MD.     Allergies:   Pollen extract and Other   Social History   Socioeconomic History  . Marital status: Married    Spouse name: [name]  . Number of children: 2  . Years of education: Not on file  . Highest education level: Master's degree (e.g., MA, MS, MEng, MEd, MSW, MBA)  Occupational History  . Occupation: retired 1995, worked in [place] , finances   Tobacco Use  . Smoking status: Former Smoker    Packs/day: 1.00    Years: 15.00    Pack years: 15.00    Types: Cigarettes    Quit date: 04/20/1968    Years since quitting: 51.3  . Smokeless tobacco: Never Used  Substance and Sexual Activity  . Alcohol use: Yes    Comment: occassional glass of wine   . Drug use: No  . Sexual activity: Not Currently  Other Topics Concern  . Not on file  Social History Narrative   Lives w/ wife   Has a Daughter 80 y/o   Left handed   Social Determinants of Health   Financial Resource Strain:   . Difficulty of Paying Living Expenses:   Food Insecurity:   . Worried About Charity fundraiser in the Last Year:   . Arboriculturist in the Last Year:   Transportation Needs:   . Film/video editor (Medical):   Marland Kitchen Lack of Transportation (Non-Medical):   Physical Activity:   . Days of Exercise per Week:   . Minutes of Exercise per Session:   Stress:   . Feeling of Stress :   Social Connections:   . Frequency of Communication with Friends and Family:   . Frequency of Social Gatherings with Friends and Family:   . Attends Religious Services:   . Active Member of Clubs  or Organizations:   . Attends Archivist Meetings:   Marland Kitchen Marital Status:      Family History: The patient's family history includes Alzheimer's disease in his mother; Cancer in his father; Diabetes in his son; Healthy in his daughter; Liver cancer in his father. There is no history of Prostate cancer or CAD.  ROS:   Please see the history of present illness.     All other systems reviewed and are negative.  EKGs/Labs/Other Studies Reviewed:    The following studies were reviewed today:   EKG:  EKG  is  ordered today.  The ekg ordered today demonstrates normal sinus rhythm with PACs, rate 65, motion artifact, no ST/T abnormalities  Recent Labs: 08/31/2018: TSH 0.84 07/31/2019: ALT 4; BUN 24; Creatinine, Ser 1.00; Hemoglobin 10.5; Platelets 273.0; Potassium 4.2; Sodium 138  Recent Lipid Panel    Component Value Date/Time   CHOL 157 07/31/2019 1518   TRIG 71.0 07/31/2019 1518   HDL 38.50 (L) 07/31/2019 1518   CHOLHDL 4 07/31/2019 1518   VLDL 14.2 07/31/2019 1518   LDLCALC 104 (H) 07/31/2019 1518    Physical Exam:    VS:  Temp (!) 97 F (36.1 C)   Ht 5\' 11"  (1.803 m)   Wt 142 lb (64.4 kg)   BMI 19.80 kg/m     Wt Readings from Last 3 Encounters:  08/01/19 142 lb (64.4 kg)  07/31/19 142 lb 2 oz (64.5 kg)  07/26/19 142 lb 2 oz (64.5 kg)  Orthostatic Lying 145/69 58 Sitting 143/80 82 Standing 134/76 64  GEN:  in no acute distress HEENT: Normal NECK: No JVD CARDIAC: RRR, no murmurs, rubs, gallops RESPIRATORY:  Clear to auscultation without rales, wheezing or rhonchi  ABDOMEN: Soft, non-tender, non-distended MUSCULOSKELETAL:  Trace edema SKIN: Warm and dry NEUROLOGIC:  Alert and oriented x 3 PSYCHIATRIC:  Normal affect   ASSESSMENT:    1. Syncope and collapse   2. Essential hypertension    PLAN:    Syncope: Description suggestive of syncope and not mechanical falls.  Given sudden syncopal episodes with no prodromal symptoms, concerning for arrhythmia.   Will check Zio patch live x14 days.  Will check TTE to evaluate for structural heart disease.  Normal orthostatics in clinic today.  Hypertension: On losartan 100 mg daily.  Appears controlled  RTC in 2 months   Medication Adjustments/Labs and Tests Ordered: Current medicines are reviewed at length with the patient today.  Concerns regarding medicines are outlined above.  Orders Placed This Encounter  Procedures  . LONG TERM MONITOR-LIVE TELEMETRY (3-14 DAYS)  . EKG 12-Lead  . ECHOCARDIOGRAM COMPLETE   No orders of the defined types were placed in this encounter.   Patient Instructions  Medication Instructions:  Your physician recommends that you continue on your current medications as directed. Please refer to the Current Medication list given to you today.  Testing/Procedures: Your physician has requested that you have an echocardiogram. Echocardiography is a painless test that uses sound waves to create images of your heart. It provides your doctor with information about the size and shape of your heart and how well your heart's chambers and valves are working. This procedure takes approximately one hour. There are no restrictions for this procedure. This will be done at our [facility] location:  [place] has requested you wear your ZIO patch monitor 14 days.   This is a single patch monitor.  [employer] supplies one patch monitor per enrollment.  Additional stickers are not available.   Please do not apply patch if you will be having a Nuclear Stress Test, Echocardiogram, Cardiac CT, MRI, or Chest Xray during the time frame you would be wearing the monitor. The patch cannot be worn during these tests.  You cannot remove and re-apply the ZIO XT patch monitor.   Your ZIO patch monitor will be sent USPS Priority mail from [facility] directly to your home address. The monitor may also be mailed  to a PO  BOX if home delivery is not available.   It may take 3-5 days to receive your monitor after you have been enrolled.   Once you have received you monitor, please review enclosed instructions.  Your monitor has already been registered assigning a specific monitor serial # to you.   Applying the monitor   Shave hair from upper left chest.   Hold abrader disc by orange tab.  Rub abrader in 40 strokes over left upper chest as indicated in your monitor instructions.   Clean area with 4 enclosed alcohol pads .  Use all pads to assure are is cleaned thoroughly.  Let dry.   Apply patch as indicated in monitor instructions.  Patch will be place under collarbone on left side of chest with arrow pointing upward.   Rub patch adhesive wings for 2 minutes.Remove white label marked "1".  Remove white label marked "2".  Rub patch adhesive wings for 2 additional minutes.   While looking in a mirror, press and release button in center of patch.  A small green light will flash 3-4 times .  This will be your only indicator the monitor has been turned on.     Do not shower for the first 24 hours.  You may shower after the first 24 hours.   Press button if you feel a symptom. You will hear a small click.  Record Date, Time and Symptom in the Patient Log Book.   When you are ready to remove patch, follow instructions on last 2 pages of Patient Log Book.  Stick patch monitor onto last page of Patient Log Book.   Place Patient Log Book in [place] box.  Use locking tab on box and tape box closed securely.  The [employer] has [employer] on it.  Please place in mailbox as soon as possible.  Your physician should have your test results approximately 7 days after the monitor has been mailed back to [facility].   Call [place] at [phone] if you have questions regarding your ZIO XT patch monitor.  Call them immediately if you see an orange light blinking on your monitor.     If your monitor falls off in less than 4 days contact our Monitor department at [phone].  If your monitor becomes loose or falls off after 4 days call [employer] at [phone] for suggestions on securing your monitor.   Follow-Up: At [facility], you and your health needs are our priority.  As part of our continuing mission to provide you with exceptional heart care, we have created designated Provider Care Teams.  These Care Teams include your primary Cardiologist (physician) and Advanced Practice Providers (APPs -  Physician Assistants and Nurse Practitioners) who all work together to provide you with the care you need, when you need it.  We recommend signing up for the patient portal called "MyChart".  Sign up information is provided on this After Visit Summary.  MyChart is used to connect with patients for Virtual Visits (Telemedicine).  Patients are able to view lab/test results, encounter notes, upcoming appointments, etc.  Non-urgent messages can be sent to your provider as well.   To learn more about what you can do with MyChart, go to [email or web address].    Your next appointment:   2 month(s)  The format for your next appointment:   In Person or Virtual  Provider:   Oswaldo Milian, MD        Signed, Donato Heinz, MD  08/01/2019 9:40 PM    [facility]

## 2019-07-31 NOTE — Progress Notes (Signed)
Pre visit review using our clinic review tool, if applicable. No additional management support is needed unless otherwise documented below in the visit note. 

## 2019-07-31 NOTE — Patient Instructions (Signed)
   GO TO THE FRONT DESK, PLEASE SCHEDULE YOUR APPOINTMENTS Come back for  A check up in 4 months

## 2019-08-01 ENCOUNTER — Encounter: Payer: Self-pay | Admitting: Cardiology

## 2019-08-01 ENCOUNTER — Ambulatory Visit (INDEPENDENT_AMBULATORY_CARE_PROVIDER_SITE_OTHER): Payer: Medicare Other | Admitting: Cardiology

## 2019-08-01 VITALS — Temp 97.0°F | Ht 71.0 in | Wt 142.0 lb

## 2019-08-01 DIAGNOSIS — R55 Syncope and collapse: Secondary | ICD-10-CM

## 2019-08-01 DIAGNOSIS — I10 Essential (primary) hypertension: Secondary | ICD-10-CM | POA: Diagnosis not present

## 2019-08-01 LAB — COMPREHENSIVE METABOLIC PANEL
ALT: 4 U/L (ref 0–53)
AST: 9 U/L (ref 0–37)
Albumin: 4 g/dL (ref 3.5–5.2)
Alkaline Phosphatase: 107 U/L (ref 39–117)
BUN: 24 mg/dL — ABNORMAL HIGH (ref 6–23)
CO2: 25 mEq/L (ref 19–32)
Calcium: 9 mg/dL (ref 8.4–10.5)
Chloride: 105 mEq/L (ref 96–112)
Creatinine, Ser: 1 mg/dL (ref 0.40–1.50)
GFR: 71.31 mL/min (ref >60.00)
Glucose, Bld: 105 mg/dL — ABNORMAL HIGH (ref 70–99)
Potassium: 4.2 mEq/L (ref 3.5–5.1)
Sodium: 138 mEq/L (ref 135–145)
Total Bilirubin: 1.2 mg/dL (ref 0.2–1.2)
Total Protein: 5.9 g/dL — ABNORMAL LOW (ref 6.0–8.3)

## 2019-08-01 LAB — CBC WITH DIFFERENTIAL/PLATELET
Basophils Absolute: 0.1 10*3/uL (ref 0.0–0.1)
Basophils Relative: 1.3 % (ref 0.0–3.0)
Eosinophils Absolute: 0.2 10*3/uL (ref 0.0–0.7)
Eosinophils Relative: 3.4 % (ref 0.0–5.0)
HCT: 31.3 % — ABNORMAL LOW (ref 39.0–52.0)
Hemoglobin: 10.5 g/dL — ABNORMAL LOW (ref 13.0–17.0)
Lymphocytes Relative: 13.5 % (ref 12.0–46.0)
Lymphs Abs: 0.9 10*3/uL (ref 0.7–4.0)
MCHC: 33.4 g/dL (ref 30.0–36.0)
MCV: 96.9 fl (ref 78.0–100.0)
Monocytes Absolute: 0.6 10*3/uL (ref 0.1–1.0)
Monocytes Relative: 8.6 % (ref 3.0–12.0)
Neutro Abs: 5.1 10*3/uL (ref 1.4–7.7)
Neutrophils Relative %: 73.2 % (ref 43.0–77.0)
Platelets: 273 10*3/uL (ref 150.0–400.0)
RBC: 3.23 Mil/uL — ABNORMAL LOW (ref 4.22–5.81)
RDW: 13.9 % (ref 11.5–15.5)
WBC: 6.9 10*3/uL (ref 4.0–10.5)

## 2019-08-01 LAB — LIPID PANEL
Cholesterol: 157 mg/dL (ref 0–200)
HDL: 38.5 mg/dL — ABNORMAL LOW (ref >39.00)
LDL Cholesterol: 104 mg/dL — ABNORMAL HIGH (ref 0–99)
NonHDL: 118.65
Total CHOL/HDL Ratio: 4
Triglycerides: 71 mg/dL (ref 0.0–149.0)
VLDL: 14.2 mg/dL (ref 0.0–40.0)

## 2019-08-01 NOTE — Assessment & Plan Note (Addendum)
  Suture removal: 3 sutures removed from the left hand; recommend to let me know if he developed redness or swelling. Syncope?  Since the last visit had at least 1 other episode when he found himself on the floor, fortunately with no major consequence. Again is strongly advised not to be without supervision. He will see cardiology tomorrow, he asked me why he needed to see cardiology, explained  I like to be sure he does not have an arrhythmia which could cause his symptoms. If cardiology does not believe that is the case, will discuss with neurology. He verbalized understanding HTN: Check a CMP and CBC High cholesterol, check FLP RTC 4 months

## 2019-08-01 NOTE — Patient Instructions (Signed)
Medication Instructions:  Your physician recommends that you continue on your current medications as directed. Please refer to the Current Medication list given to you today.  Testing/Procedures: Your physician has requested that you have an echocardiogram. Echocardiography is a painless test that uses sound waves to create images of your heart. It provides your doctor with information about the size and shape of your heart and how well your heart's chambers and valves are working. This procedure takes approximately one hour. There are no restrictions for this procedure.  This will be done at our [address] location:  [address]   ZIO XT- Long Term Monitor Instructions   Your physician has requested you wear your ZIO patch monitor___14___days.   This is a single patch monitor.  [employer] supplies one patch monitor per enrollment.  Additional stickers are not available.   Please do not apply patch if you will be having a Nuclear Stress Test, Echocardiogram, Cardiac CT, MRI, or Chest Xray during the time frame you would be wearing the monitor. The patch cannot be worn during these tests.  You cannot remove and re-apply the ZIO XT patch monitor.   Your ZIO patch monitor will be sent USPS Priority mail from [employer] directly to your home address. The monitor may also be mailed to a PO BOX if home delivery is not available.   It may take 3-5 days to receive your monitor after you have been enrolled.   Once you have received you monitor, please review enclosed instructions.  Your monitor has already been registered assigning a specific monitor serial # to you.   Applying the monitor   Shave hair from upper left chest.   Hold abrader disc by orange tab.  Rub abrader in 40 strokes over left upper chest as indicated in your monitor instructions.   Clean area with 4 enclosed alcohol pads .  Use all pads to assure are is cleaned thoroughly.  Let dry.   Apply patch  as indicated in monitor instructions.  Patch will be place under collarbone on left side of chest with arrow pointing upward.   Rub patch adhesive wings for 2 minutes.Remove white label marked "1".  Remove white label marked "2".  Rub patch adhesive wings for 2 additional minutes.   While looking in a mirror, press and release button in center of patch.  A small green light will flash 3-4 times .  This will be your only indicator the monitor has been turned on.     Do not shower for the first 24 hours.  You may shower after the first 24 hours.   Press button if you feel a symptom. You will hear a small click.  Record Date, Time and Symptom in the Patient Log Book.   When you are ready to remove patch, follow instructions on last 2 pages of Patient Log Book.  Stick patch monitor onto last page of Patient Log Book.   Place Patient Log Book in [place] box.  Use locking tab on box and tape box closed securely.  The [employer] box has prepaid postage on it.  Please place in mailbox as soon as possible.  Your physician should have your test results approximately 7 days after the monitor has been mailed back to [employer].   Call [employer] Customer Care at [phone] if you have questions regarding your ZIO XT patch monitor.  Call them immediately if you see an orange light blinking on your   monitor.   If your monitor falls off in less than 4 days contact our Monitor department at [phone].  If your monitor becomes loose or falls off after 4 days call [employer] at [phone] for suggestions on securing your monitor.   Follow-Up: At [facility], you and your health needs are our priority.  As part of our continuing mission to provide you with exceptional heart care, we have created designated Provider Care Teams.  These Care Teams include your primary Cardiologist (physician) and Advanced Practice Providers (APPs -  Physician Assistants and Nurse Practitioners) who all work together to  provide you with the care you need, when you need it.  We recommend signing up for the patient portal called "MyChart".  Sign up information is provided on this After Visit Summary.  MyChart is used to connect with patients for Virtual Visits (Telemedicine).  Patients are able to view lab/test results, encounter notes, upcoming appointments, etc.  Non-urgent messages can be sent to your provider as well.   To learn more about what you can do with MyChart, go to [email or web address].    Your next appointment:   2 month(s)  The format for your next appointment:   In Person or Virtual  Provider:   Oswaldo Milian, MD

## 2019-08-02 ENCOUNTER — Other Ambulatory Visit: Payer: Self-pay | Admitting: *Deleted

## 2019-08-02 DIAGNOSIS — R55 Syncope and collapse: Secondary | ICD-10-CM

## 2019-08-03 ENCOUNTER — Other Ambulatory Visit: Payer: Medicare Other

## 2019-08-03 ENCOUNTER — Telehealth: Payer: Self-pay | Admitting: *Deleted

## 2019-08-03 DIAGNOSIS — D649 Anemia, unspecified: Secondary | ICD-10-CM | POA: Diagnosis not present

## 2019-08-03 NOTE — Telephone Encounter (Signed)
Future TIBC order placed and request faxed to [facility] to release order and send to [facility] if possible.

## 2019-08-04 LAB — IRON, TOTAL/TOTAL IRON BINDING CAP
%SAT: 16 % (calc) — ABNORMAL LOW (ref 20–48)
Iron: 36 ug/dL — ABNORMAL LOW (ref 50–180)
TIBC: 227 mcg/dL (calc) — ABNORMAL LOW (ref 250–425)

## 2019-08-10 ENCOUNTER — Ambulatory Visit (INDEPENDENT_AMBULATORY_CARE_PROVIDER_SITE_OTHER): Payer: Medicare Other

## 2019-08-10 DIAGNOSIS — R55 Syncope and collapse: Secondary | ICD-10-CM

## 2019-08-10 NOTE — Progress Notes (Signed)
Nurse connected with patient 08/11/19 at  3:15 PM EDT by a telephone enabled telemedicine application and verified that I am speaking with the correct person using two identifiers. Patient stated full name and DOB. Patient gave permission to continue with virtual visit. Patient's location was at home and Nurse's location was at  office.  [name] wife is also on the phone call and answers most questions. Okay per pt.   Subjective:   [name] is a 86 y.o. male who presents for Medicare Annual/Subsequent preventive examination.  Review of Systems:   Home Safety/Smoke Alarms: Feels safe in home. Smoke alarms in place.  Lives w/ wife and dog. Caregivers M-F 9-2 and 8p-8a. Shower chair.   Male:     PSA-  Lab Results  Component Value Date   PSA 4.33 (H) 04/17/2013   PSA 3.63 03/23/2012   PSA 2.95 03/18/2011       Objective:    Vitals: Unable to assess. This visit is enabled though telemedicine due to Covid 19.   Advanced Directives 08/11/2019 07/18/2019 06/15/2019 12/16/2018 04/06/2017 02/01/2017 01/04/2017  Does Patient Have a Medical Advance Directive? Yes Yes Yes Yes Yes Yes No  Type of Paramedic of [place];Living will [place];Living will - Living will;Healthcare Power of [address] [place];Living will Living will -  Does patient want to make changes to medical advance directive? No - Patient declined No - Patient declined - - No - Patient declined - -  Copy of [place] in Chart? Yes - validated most recent copy scanned in chart (See row information) No - copy requested - - Yes - -  Would patient like information on creating a medical advance directive? - No - Patient declined - - - - -  Pre-existing out of facility DNR order (yellow form or pink MOST form) - - - - - - -    Tobacco Social History   Tobacco Use  Smoking Status Former Smoker  . Packs/day: 1.00  . Years: 15.00  . Pack years:  15.00  . Types: Cigarettes  . Quit date: 04/20/1968  . Years since quitting: 51.3  Smokeless Tobacco Never Used     Counseling given: Not Answered   Clinical Intake: Pain : No/denies pain     Past Medical History:  Diagnosis Date  . Allergic rhinitis   . Anxiety   . Bladder cancer [facility])    urologist-  dr eskridge--  low grade TA  . BPH with elevated PSA   . Complication of anesthesia    "paranoid after back surgery in october lasted 2 to 3 days"  . DDD (degenerative disc disease), lumbar   . Depression   . Dry mouth    USES BIOTIN SPRAY/ MOUTHWASH  . Frequency of urination   . GERD (gastroesophageal reflux disease)   . Hearing loss    does not wear his hearing aid  . History of kidney stones    10/1989  . Hyperlipidemia   . Hypertension   . Mild obstructive sleep apnea    PER PT  MILD OSA , NO CPAP RX,  STUDY DONE 2009  . Nocturia   . OAB (overactive bladder)   . Osteoarthritis    KNEES, SHOULDER  . Parkinson disease ([place]) DX   2005   NEUROLOGIST-   DR TAT--  IDIOPATHIC PARKINSON'S /  TREMORS CONTROLLED WITH MEDS  . Urge urinary incontinence    Past Surgical History:  Procedure  Laterality Date  . CATARACT EXTRACTION W/ INTRAOCULAR LENS  IMPLANT, BILATERAL  2014  . CYSTOSCOPY Bilateral 04/09/2017   Procedure: CYSTOSCOPY/ RETROGRADE;  Surgeon: Festus Aloe, MD;  Location: [facility];  Service: Urology;  Laterality: Bilateral;  . CYSTOSCOPY W/ RETROGRADES Bilateral 12/14/2014   Procedure: CYSTOSCOPY BLADDER BIOPSY FULGERATION MITOMYCIN C BILATERAL RETROGRADE PYELOGRAM;  Surgeon: Festus Aloe, MD;  Location: [facility];  Service: Urology;  Laterality: Bilateral;  . CYSTOSCOPY WITH STENT PLACEMENT Left 12/19/2013   Procedure: CYSTOSCOPY BLADDER BX, LEFT URETERAL STENT PLACEMENT , LEFT RETROGRADE AND PYLOGRAM;  Surgeon: Festus Aloe, MD;  Location: [facility];  Service: Urology;  Laterality: Left;  . EYE SURGERY Bilateral    ioc for  cataract  . HERNIA REPAIR  2013   inguinal  . INGUINAL HERNIA REPAIR  03/09/2012   Procedure: HERNIA REPAIR INGUINAL ADULT;  Surgeon: Adin Hector, MD;  Location: [facility];  Service: General;  Laterality: Right;  . INSERTION OF MESH  03/09/2012   Procedure: INSERTION OF MESH;  Surgeon: Adin Hector, MD;  Location: [facility];  Service: General;  Laterality: Right;  . LUMBAR LAMINECTOMY/DECOMPRESSION MICRODISCECTOMY N/A 02/05/2017   Procedure: Laminectomy and Foraminotomy - Lumbar One-Two,Lumbar Two-Three, Lumbar Three-Four.;  Surgeon: Eustace Moore, MD;  Location: [facility];  Service: Neurosurgery;  Laterality: N/A;  . NASAL SINUS SURGERY  1982  . ORIF RIGHT ARM FX  1949   HARDWARE REMOVED   . REMOVAL BENIGN CYST LEFT FOREHEAD  1969  . TRANSURETHRAL RESECTION OF BLADDER TUMOR N/A 12/19/2013   Procedure:  TRANSURETHRAL RESECTION OF BLADDER TUMOR WITH GYRUS (TURBT-GYRUS);  Surgeon: Festus Aloe, MD;  Location: [facility];  Service: Urology;  Laterality: N/A;  . TRANSURETHRAL RESECTION OF BLADDER TUMOR N/A 04/09/2017   Procedure: TRANSURETHRAL RESECTION OF BLADDER TUMOR / (TURBT) 2-5cm;  Surgeon: Festus Aloe, MD;  Location: [facility];  Service: Urology;  Laterality: N/A;  ONLY NEEDS 90 MIN FOR BOTH PROCEDURES   Family History  Problem Relation Age of Onset  . Alzheimer's disease Mother   . Liver cancer Father   . Cancer Father        colon  . Healthy Daughter   . Diabetes Son   . Prostate cancer Neg Hx   . CAD Neg Hx    Social History   Socioeconomic History  . Marital status: Married    Spouse name: [name]  . Number of children: 2  . Years of education: Not on file  . Highest education level: Master's degree (e.g., MA, MS, MEng, MEd, MSW, MBA)  Occupational History  . Occupation: retired 1995, worked in [place] , finances   Tobacco Use  . Smoking status: Former Smoker    Packs/day: 1.00    Years: 15.00    Pack years: 15.00    Types: Cigarettes    Quit  date: 04/20/1968    Years since quitting: 51.3  . Smokeless tobacco: Never Used  Substance and Sexual Activity  . Alcohol use: Yes    Comment: occassional glass of wine   . Drug use: No  . Sexual activity: Not Currently  Other Topics Concern  . Not on file  Social History Narrative   Lives w/ wife   Has a Daughter 41 y/o   Left handed   Social Determinants of Health   Financial Resource Strain: Low Risk   . Difficulty of Paying Living Expenses: Not hard at all  Food Insecurity: No Food Insecurity  .  Worried About Charity fundraiser in the Last Year: Never true  . Ran Out of Food in the Last Year: Never true  Transportation Needs: No Transportation Needs  . Lack of Transportation (Medical): No  . Lack of Transportation (Non-Medical): No  Physical Activity:   . Days of Exercise per Week:   . Minutes of Exercise per Session:   Stress:   . Feeling of Stress :   Social Connections:   . Frequency of Communication with Friends and Family:   . Frequency of Social Gatherings with Friends and Family:   . Attends Religious Services:   . Active Member of Clubs or Organizations:   . Attends Archivist Meetings:   Marland Kitchen Marital Status:     Outpatient Encounter Medications as of 08/11/2019  Medication Sig  . acetaminophen (TYLENOL) 500 MG tablet Take 500-1,000 mg by mouth See admin instructions. 1000 mg in the morning, 500 mg at noon, 500 mg at 1600, and 1000 mg at night  . bacitracin ointment Apply 1 application topically 2 (two) times daily.  . Ca Carbonate-Mag Hydroxide (ROLAIDS PO) Take 2 tablets by mouth as needed (indigestion).   . carbidopa-levodopa (SINEMET CR) 50-200 MG tablet TAKE 1 TABLET BY MOUTH EVERYDAY AT BEDTIME  . carbidopa-levodopa (SINEMET IR) 25-100 MG tablet TAKE TWO TABLETS BY MOUTH FOUR TIMES DAILY  . docusate sodium (COLACE) 50 MG capsule Take 50-100 mg by mouth See admin instructions. Take 100 mg in the morning and 50 mg at 4pm  . fexofenadine (ALLEGRA)  180 MG tablet Take 180 mg by mouth daily. At 1600  . fluticasone (FLONASE) 50 MCG/ACT nasal spray Place 2 sprays into both nostrils daily as needed for allergies or rhinitis.  Marland Kitchen ibuprofen (ADVIL,MOTRIN) 200 MG tablet Take 400 mg by mouth every 8 (eight) hours as needed.  Marland Kitchen losartan (COZAAR) 100 MG tablet TAKE 1 TABLET (100 MG TOTAL) BY MOUTH DAILY AT 12 NOON.  . Multiple Vitamins-Minerals (MULTIVITAMIN ADULT PO) Take by mouth daily.  Marland Kitchen MYRBETRIQ 25 MG TB24 tablet Take 50 mg by mouth daily. At 1600  . polyethylene glycol (MIRALAX / GLYCOLAX) packet Take 17 g by mouth daily as needed for mild constipation.   . Polyvinyl Alcohol-Povidone (REFRESH OP) Place 1-2 drops into both eyes daily as needed (dry eyes).  . Probiotic Product (PROBIOTIC DAILY PO) Take 1 capsule by mouth daily.  . sertraline (ZOLOFT) 100 MG tablet TAKE 1 TABLET EVERY DAY  . solifenacin (VESICARE) 10 MG tablet Take 10 mg by mouth daily.  . tamsulosin (FLOMAX) 0.4 MG CAPS capsule Take 0.4 mg by mouth daily after supper.   . donepezil (ARICEPT) 10 MG tablet Take 1 tablet (10 mg total) by mouth at bedtime. (Patient not taking: Reported on 08/11/2019)  . donepezil (ARICEPT) 5 MG tablet Take 1 tablet (5 mg total) by mouth at bedtime. (Patient not taking: Reported on 08/11/2019)  . [DISCONTINUED] vitamin E 100 UNIT capsule Take by mouth as needed.   No facility-administered encounter medications on file as of 08/11/2019.    Activities of Daily Living In your present state of health, do you have any difficulty performing the following activities: 08/11/2019 07/26/2019  Hearing? N N  Vision? N N  Difficulty concentrating or making decisions? Tempie Donning  Walking or climbing stairs? Y Y  Dressing or bathing? Y Y  Doing errands, shopping? Tempie Donning  Preparing Food and eating ? Y -  Using the Toilet? Y -  In the past six months,  have you accidently leaked urine? Y -  Do you have problems with loss of bowel control? Y -  Managing your Medications? Y -    Managing your Finances? Y -  Housekeeping or managing your Housekeeping? Y -  Some recent data might be hidden    Patient Care Team: Colon Branch, MD as PCP - General (Internal Medicine) Gatha Mayer, MD as Consulting Physician (Gastroenterology) Festus Aloe, MD as Consulting Physician (Urology) Tat, Eustace Quail, DO as Consulting Physician (Neurology) Suella Broad, MD as Consulting Physician (Physical Medicine and Rehabilitation)   Assessment:   This is a routine wellness examination for X[place]. Physical assessment deferred to PCP.  Exercise Activities and Dietary recommendations Current Exercise Habits: The patient does not participate in regular exercise at present, Exercise limited by: neurologic condition(s);psychological condition(s) Diet (meal preparation, eat out, water intake, caffeinated beverages, dairy products, fruits and vegetables): in general, a "healthy" diet  , well balanced   Goals    . DIET - INCREASE WATER INTAKE       Fall Risk Fall Risk  08/11/2019 06/15/2019 12/16/2018 02/08/2018 04/06/2017  Falls in the past year? 1 1 1  Yes No  Number falls in past yr: 1 1 0 1 -  Injury with Fall? 1 0 0 No -  Risk Factor Category  - - - - -  Risk for fall due to : - - - Impaired balance/gait;Impaired mobility -  Follow up Education provided;Falls prevention discussed - - Falls evaluation completed;Education provided;Falls prevention discussed -    Depression Screen PHQ 2/9 Scores 08/11/2019 05/12/2016 09/12/2015 05/01/2015  PHQ - 2 Score 0 0 0 2  PHQ- 9 Score - - - 9  Exception Documentation - - - Other- indicate reason in comment box  Not completed - - - Anxiety    Cognitive Function    MMSE - Mini Mental State Exam 08/11/2019 02/08/2018 09/08/2017 04/06/2017 08/06/2016  Not completed: Refused;Unable to complete Unable to complete Unable to complete Refused Unable to complete        Immunization History  Administered Date(s) Administered  . H1N1 04/26/2008   . Influenza Split 03/18/2011, 01/13/2012  . Influenza Whole 02/28/2007, 01/30/2008, 01/09/2009, 02/12/2010  . Influenza, High Dose Seasonal PF 02/15/2013, 05/12/2016, 03/03/2017, 03/01/2018, 02/09/2019  . Influenza,inj,Quad PF,6+ Mos 04/26/2014, 05/01/2015  . Pneumococcal Conjugate-13 10/23/2014  . Pneumococcal Polysaccharide-23 04/17/2013  . Td 10/29/2011  . Tdap 07/18/2019  . Zoster Recombinat (Shingrix) 06/16/2016, 08/14/2016    Screening Tests Health Maintenance  Topic Date Due  . COVID-19 Vaccine (1) Never done  . INFLUENZA VACCINE  11/19/2019  . TETANUS/TDAP  07/17/2029  . PNA vac Low Risk Adult  Completed     Plan:    Please schedule your next medicare wellness visit with me in 1 yr.  Continue to eat heart healthy diet (full of fruits, vegetables, whole grains, lean protein, water--limit salt, fat, and sugar intake) and increase physical activity as tolerated.  Continue doing brain stimulating activities (puzzles, reading, adult coloring books, staying active) to keep memory sharp.     I have personally reviewed and noted the following in the patient's chart:   . Medical and social history . Use of alcohol, tobacco or illicit drugs  . Current medications and supplements . Functional ability and status . Nutritional status . Physical activity . Advanced directives . List of other physicians . Hospitalizations, surgeries, and ER visits in previous 12 months . Vitals . Screenings to include cognitive, depression, and falls .  Referrals and appointments  In addition, I have reviewed and discussed with patient certain preventive protocols, quality metrics, and best practice recommendations. A written personalized care plan for preventive services as well as general preventive health recommendations were provided to patient.     Naaman Plummer [place], [place]  08/11/2019

## 2019-08-11 ENCOUNTER — Encounter: Payer: Self-pay | Admitting: *Deleted

## 2019-08-11 ENCOUNTER — Ambulatory Visit (INDEPENDENT_AMBULATORY_CARE_PROVIDER_SITE_OTHER): Payer: Medicare Other | Admitting: *Deleted

## 2019-08-11 ENCOUNTER — Other Ambulatory Visit: Payer: Self-pay

## 2019-08-11 DIAGNOSIS — Z0000 Encounter for general adult medical examination without abnormal findings: Secondary | ICD-10-CM | POA: Diagnosis not present

## 2019-08-11 NOTE — Patient Instructions (Addendum)
Please schedule your next medicare wellness visit with me in 1 yr.  Continue to eat heart healthy diet (full of fruits, vegetables, whole grains, lean protein, water--limit salt, fat, and sugar intake) and increase physical activity as tolerated.  Continue doing brain stimulating activities (puzzles, reading, adult coloring books, staying active) to keep memory sharp.    [name] , Thank you for taking time to come for your Medicare Wellness Visit. I appreciate your ongoing commitment to your health goals. Please review the following plan we discussed and let me know if I can assist you in the future.   These are the goals we discussed: Goals    . DIET - INCREASE WATER INTAKE       This is a list of the screening recommended for you and due dates:  Health Maintenance  Topic Date Due  . COVID-19 Vaccine (1) Never done  . Flu Shot  11/19/2019  . Tetanus Vaccine  07/17/2029  . Pneumonia vaccines  Completed    Preventive Care 53 Years and Older, Male Preventive care refers to lifestyle choices and visits with your health care provider that can promote health and wellness. This includes:  A yearly physical exam. This is also called an annual well check.  Regular dental and eye exams.  Immunizations.  Screening for certain conditions.  Healthy lifestyle choices, such as diet and exercise. What can I expect for my preventive care visit? Physical exam Your health care provider will check:  Height and weight. These may be used to calculate body mass index (BMI), which is a measurement that tells if you are at a healthy weight.  Heart rate and blood pressure.  Your skin for abnormal spots. Counseling Your health care provider may ask you questions about:  Alcohol, tobacco, and drug use.  Emotional well-being.  Home and relationship well-being.  Sexual activity.  Eating habits.  History of falls.  Memory and ability to understand (cognition).  Work and work  Statistician. What immunizations do I need?  Influenza (flu) vaccine  This is recommended every year. Tetanus, diphtheria, and pertussis (Tdap) vaccine  You may need a Td booster every 10 years. Varicella (chickenpox) vaccine  You may need this vaccine if you have not already been vaccinated. Zoster (shingles) vaccine  You may need this after age 66. Pneumococcal conjugate (PCV13) vaccine  One dose is recommended after age 57. Pneumococcal polysaccharide (PPSV23) vaccine  One dose is recommended after age 58. Measles, mumps, and rubella (MMR) vaccine  You may need at least one dose of MMR if you were born in 1957 or later. You may also need a second dose. Meningococcal conjugate (MenACWY) vaccine  You may need this if you have certain conditions. Hepatitis A vaccine  You may need this if you have certain conditions or if you travel or work in places where you may be exposed to hepatitis A. Hepatitis B vaccine  You may need this if you have certain conditions or if you travel or work in places where you may be exposed to hepatitis B. Haemophilus influenzae type b (Hib) vaccine  You may need this if you have certain conditions. You may receive vaccines as individual doses or as more than one vaccine together in one shot (combination vaccines). Talk with your health care provider about the risks and benefits of combination vaccines. What tests do I need? Blood tests  Lipid and cholesterol levels. These may be checked every 5 years, or more frequently depending on your overall  health.  Hepatitis C test.  Hepatitis B test. Screening  Lung cancer screening. You may have this screening every year starting at age 53 if you have a 30-pack-year history of smoking and currently smoke or have quit within the past 15 years.  Colorectal cancer screening. All adults should have this screening starting at age 19 and continuing until age 88. Your health care provider may recommend  screening at age 78 if you are at increased risk. You will have tests every 1-10 years, depending on your results and the type of screening test.  Prostate cancer screening. Recommendations will vary depending on your family history and other risks.  Diabetes screening. This is done by checking your blood sugar (glucose) after you have not eaten for a while (fasting). You may have this done every 1-3 years.  Abdominal aortic aneurysm (AAA) screening. You may need this if you are a current or former smoker.  Sexually transmitted disease (STD) testing. Follow these instructions at home: Eating and drinking  Eat a diet that includes fresh fruits and vegetables, whole grains, lean protein, and low-fat dairy products. Limit your intake of foods with high amounts of sugar, saturated fats, and salt.  Take vitamin and mineral supplements as recommended by your health care provider.  Do not drink alcohol if your health care provider tells you not to drink.  If you drink alcohol: ? Limit how much you have to 0-2 drinks a day. ? Be aware of how much alcohol is in your drink. In the [place] one drink equals one 12 oz bottle of beer (355 mL), one 5 oz glass of wine (148 mL), or one 1 oz glass of hard liquor (44 mL). Lifestyle  Take daily care of your teeth and gums.  Stay active. Exercise for at least 30 minutes on 5 or more days each week.  Do not use any products that contain nicotine or tobacco, such as cigarettes, e-cigarettes, and chewing tobacco. If you need help quitting, ask your health care provider.  If you are sexually active, practice safe sex. Use a condom or other form of protection to prevent STIs (sexually transmitted infections).  Talk with your health care provider about taking a low-dose aspirin or statin. What's next?  Visit your health care provider once a year for a well check visit.  Ask your health care provider how often you should have your eyes and teeth  checked.  Stay up to date on all vaccines. This information is not intended to replace advice given to you by your health care provider. Make sure you discuss any questions you have with your health care provider. Document Revised: 03/31/2018 Document Reviewed: 03/31/2018 Elsevier Patient Education  2020 [employer].

## 2019-08-16 ENCOUNTER — Other Ambulatory Visit: Payer: Self-pay

## 2019-08-16 ENCOUNTER — Ambulatory Visit (HOSPITAL_COMMUNITY): Payer: Medicare Other | Attending: Cardiovascular Disease

## 2019-08-16 DIAGNOSIS — R55 Syncope and collapse: Secondary | ICD-10-CM | POA: Diagnosis not present

## 2019-08-17 ENCOUNTER — Other Ambulatory Visit: Payer: Self-pay | Admitting: *Deleted

## 2019-08-17 DIAGNOSIS — I350 Nonrheumatic aortic (valve) stenosis: Secondary | ICD-10-CM

## 2019-08-18 ENCOUNTER — Encounter: Payer: Self-pay | Admitting: Internal Medicine

## 2019-08-18 DIAGNOSIS — N390 Urinary tract infection, site not specified: Secondary | ICD-10-CM

## 2019-08-18 DIAGNOSIS — G20 Parkinson's disease: Secondary | ICD-10-CM

## 2019-08-18 DIAGNOSIS — F0280 Dementia in other diseases classified elsewhere without behavioral disturbance: Secondary | ICD-10-CM

## 2019-08-23 ENCOUNTER — Other Ambulatory Visit (INDEPENDENT_AMBULATORY_CARE_PROVIDER_SITE_OTHER): Payer: Medicare Other

## 2019-08-23 ENCOUNTER — Other Ambulatory Visit: Payer: Self-pay

## 2019-08-23 DIAGNOSIS — N390 Urinary tract infection, site not specified: Secondary | ICD-10-CM

## 2019-08-23 DIAGNOSIS — F0280 Dementia in other diseases classified elsewhere without behavioral disturbance: Secondary | ICD-10-CM | POA: Diagnosis not present

## 2019-08-23 DIAGNOSIS — G20 Parkinson's disease: Secondary | ICD-10-CM

## 2019-08-23 LAB — URINALYSIS, ROUTINE W REFLEX MICROSCOPIC
Bilirubin Urine: NEGATIVE
Hgb urine dipstick: NEGATIVE
Ketones, ur: NEGATIVE
Leukocytes,Ua: NEGATIVE
Nitrite: NEGATIVE
RBC / HPF: NONE SEEN (ref 0–?)
Specific Gravity, Urine: 1.025 (ref 1.000–1.030)
Total Protein, Urine: NEGATIVE
Urine Glucose: NEGATIVE
Urobilinogen, UA: 0.2 (ref 0.0–1.0)
pH: 6 (ref 5.0–8.0)

## 2019-08-25 LAB — URINE CULTURE
MICRO NUMBER:: 10442095
SPECIMEN QUALITY:: ADEQUATE

## 2019-09-27 ENCOUNTER — Other Ambulatory Visit: Payer: Self-pay | Admitting: Internal Medicine

## 2019-09-27 ENCOUNTER — Other Ambulatory Visit: Payer: Self-pay | Admitting: Neurology

## 2019-09-27 ENCOUNTER — Encounter: Payer: Self-pay | Admitting: Internal Medicine

## 2019-09-27 MED ORDER — ALPRAZOLAM 0.25 MG PO TABS: 0.2500 mg | ORAL_TABLET | Freq: Three times a day (TID) | ORAL | 0 refills | Status: AC | PRN

## 2019-10-01 NOTE — Progress Notes (Signed)
Cardiology Office Note:    Date:  10/03/2019   ID:  ANIAS BARTOL, DOB 11-17-1933, MRN [id number]  PCP:  Colon Branch, MD  Cardiologist:  Donato Heinz, MD  Electrophysiologist:  None   Referring MD: Colon Branch, MD   Chief Complaint  Patient presents with  . Loss of Consciousness    History of Present Illness:    [name] is a 86 y.o. male with a hx of bladder cancer, hypertension, hyperlipidemia, OSA, Parkinson's disease who presents for follow-up.  He was referred by Dr. Larose Kells for evaluation of syncope, initially seen on 08/03/2019.  Went to the ED on 07/18/2019 after a fall.  Reports that he blacked out briefly and ended up on the floor.  He required 4 stitches in his forehead and 3 in his hand.  His wife reports that he has had 4-5 falls in the last 10 days, associated with loss of consciousness.  States that he will suddenly lose consciousness.  No prodromal symptoms.  No confusion afterwards.  Reports only unconscious for a few seconds.  Episodes seem to occur shortly after position change.  Denies any chest pain or palpitations.  Does report he gets some dyspnea on exertion, particularly walking up and down stairs.  Echocardiogram on 08/16/2019 showed LVEF 60-65%, normal RV function, mitral annular calcification with mild to moderate mitral stenosis.  30-day cardiac monitor on 09/14/2019 showed 2 episodes of NSVT lasting 6 and 10 beats as well as 8-second episode of SVT and frequent PACs.  Since last clinic visit, he reports that he has had a few more falls/possible syncopal episodes.  Reports no syncopal episodes while wearing the monitor.  However since then he states that he had an another episode where he felt like he blacked out and ended up on the floor.  Also had a mechanical fall 5 days ago.   Past Medical History:  Diagnosis Date  . Allergic rhinitis   . Anxiety   . Bladder cancer [facility])    urologist-  dr eskridge--  low grade TA  . BPH with elevated PSA   .  Complication of anesthesia    "paranoid after back surgery in october lasted 2 to 3 days"  . DDD (degenerative disc disease), lumbar   . Depression   . Dry mouth    USES BIOTIN SPRAY/ MOUTHWASH  . Frequency of urination   . GERD (gastroesophageal reflux disease)   . Hearing loss    does not wear his hearing aid  . History of kidney stones    10/1989  . Hyperlipidemia   . Hypertension   . Mild obstructive sleep apnea    PER PT  MILD OSA , NO CPAP RX,  STUDY DONE 2009  . Nocturia   . OAB (overactive bladder)   . Osteoarthritis    KNEES, SHOULDER  . Parkinson disease ([place]) DX   2005   NEUROLOGIST-   DR TAT--  IDIOPATHIC PARKINSON'S /  TREMORS CONTROLLED WITH MEDS  . Urge urinary incontinence     Past Surgical History:  Procedure Laterality Date  . CATARACT EXTRACTION W/ INTRAOCULAR LENS  IMPLANT, BILATERAL  2014  . CYSTOSCOPY Bilateral 04/09/2017   Procedure: CYSTOSCOPY/ RETROGRADE;  Surgeon: Festus Aloe, MD;  [facility];  Service: Urology;  Laterality: Bilateral;  . CYSTOSCOPY W/ RETROGRADES Bilateral 12/14/2014   Procedure: CYSTOSCOPY BLADDER BIOPSY FULGERATION MITOMYCIN C BILATERAL RETROGRADE PYELOGRAM;  Surgeon: Festus Aloe, MD;  Location: [facility];  Service:  Urology;  Laterality: Bilateral;  . CYSTOSCOPY WITH STENT PLACEMENT Left 12/19/2013   Procedure: CYSTOSCOPY BLADDER BX, LEFT URETERAL STENT PLACEMENT , LEFT RETROGRADE AND PYLOGRAM;  Surgeon: Festus Aloe, MD;  Location: [facility];  Service: Urology;  Laterality: Left;  . EYE SURGERY Bilateral    ioc for cataract  . HERNIA REPAIR  2013   inguinal  . INGUINAL HERNIA REPAIR  03/09/2012   Procedure: HERNIA REPAIR INGUINAL ADULT;  Surgeon: Adin Hector, MD;  [facility];  Service: General;  Laterality: Right;  . INSERTION OF MESH  03/09/2012   Procedure: INSERTION OF MESH;  Surgeon: Adin Hector, MD;  [facility];  Service: General;  Laterality: Right;    . LUMBAR LAMINECTOMY/DECOMPRESSION MICRODISCECTOMY N/A 02/05/2017   Procedure: Laminectomy and Foraminotomy - Lumbar One-Two,Lumbar Two-Three, Lumbar Three-Four.;  Surgeon: Eustace Moore, MD;  Location: [place];  Service: Neurosurgery;  Laterality: N/A;  . NASAL SINUS SURGERY  1982  . ORIF RIGHT ARM FX  1949   HARDWARE REMOVED   . REMOVAL BENIGN CYST LEFT FOREHEAD  1969  . TRANSURETHRAL RESECTION OF BLADDER TUMOR N/A 12/19/2013   Procedure:  TRANSURETHRAL RESECTION OF BLADDER TUMOR WITH GYRUS (TURBT-GYRUS);  Surgeon: Festus Aloe, MD;  Location: [facility];  Service: Urology;  Laterality: N/A;  . TRANSURETHRAL RESECTION OF BLADDER TUMOR N/A 04/09/2017   Procedure: TRANSURETHRAL RESECTION OF BLADDER TUMOR / (TURBT) 2-5cm;  Surgeon: Festus Aloe, MD;  [facility];  Service: Urology;  Laterality: N/A;  ONLY NEEDS 90 MIN FOR BOTH PROCEDURES    Current Medications: Current Meds  Medication Sig  . ALPRAZolam (XANAX) 0.25 MG tablet Take 1 tablet (0.25 mg total) by mouth 3 (three) times daily as needed for anxiety.  . bacitracin ointment Apply 1 application topically 2 (two) times daily.  . Ca Carbonate-Mag Hydroxide (ROLAIDS PO) Take 2 tablets by mouth as needed (indigestion).   . carbidopa-levodopa (SINEMET CR) 50-200 MG tablet TAKE 1 TABLET BY MOUTH EVERYDAY AT BEDTIME  . carbidopa-levodopa (SINEMET IR) 25-100 MG tablet TAKE TWO TABLETS BY MOUTH FOUR TIMES DAILY  . docusate sodium (COLACE) 50 MG capsule Take 50-100 mg by mouth See admin instructions. Take 100 mg in the morning and 50 mg at 4pm  . fluticasone (FLONASE) 50 MCG/ACT nasal spray Place 2 sprays into both nostrils daily as needed for allergies or rhinitis.  Marland Kitchen ibuprofen (ADVIL,MOTRIN) 200 MG tablet Take 400 mg by mouth every 8 (eight) hours as needed.  . Multiple Vitamins-Minerals (MULTIVITAMIN ADULT PO) Take by mouth daily.  Marland Kitchen MYRBETRIQ 25 MG TB24 tablet Take 50 mg by mouth daily. At 1600  . polyethylene  glycol (MIRALAX / GLYCOLAX) packet Take 17 g by mouth daily as needed for mild constipation.   . Polyvinyl Alcohol-Povidone (REFRESH OP) Place 1-2 drops into both eyes daily as needed (dry eyes).  . sertraline (ZOLOFT) 100 MG tablet Take 1.5 tablets (150 mg total) by mouth daily.  . tamsulosin (FLOMAX) 0.4 MG CAPS capsule Take 0.4 mg by mouth daily after supper.   . [DISCONTINUED] losartan (COZAAR) 100 MG tablet TAKE 1 TABLET (100 MG TOTAL) BY MOUTH DAILY AT 12 NOON.     Allergies:   Pollen extract and Other   Social History   Socioeconomic History  . Marital status: Married    Spouse name: [name]  . Number of children: 2  . Years of education: Not on file  . Highest education level: Master's degree (e.g., MA, MS,  MEng, MEd, MSW, [employer])  Occupational History  . Occupation: retired 1995, worked in [place] , finances   Tobacco Use  . Smoking status: Former Smoker    Packs/day: 1.00    Years: 15.00    Pack years: 15.00    Types: Cigarettes    Quit date: 04/20/1968    Years since quitting: 51.4  . Smokeless tobacco: Never Used  Vaping Use  . Vaping Use: Never used  Substance and Sexual Activity  . Alcohol use: Yes    Comment: occassional glass of wine   . Drug use: No  . Sexual activity: Not Currently  Other Topics Concern  . Not on file  Social History Narrative   Lives w/ wife   Has a Daughter 24 y/o   Left handed   Social Determinants of Health   Financial Resource Strain: Low Risk   . Difficulty of Paying Living Expenses: Not hard at all  Food Insecurity: No Food Insecurity  . Worried About Charity fundraiser in the Last Year: Never true  . Ran Out of Food in the Last Year: Never true  Transportation Needs: No Transportation Needs  . Lack of Transportation (Medical): No  . Lack of Transportation (Non-Medical): No  Physical Activity:   . Days of Exercise per Week:   . Minutes of Exercise per Session:   Stress:   . Feeling of Stress :   Social Connections:   .  Frequency of Communication with Friends and Family:   . Frequency of Social Gatherings with Friends and Family:   . Attends Religious Services:   . Active Member of Clubs or Organizations:   . Attends Archivist Meetings:   Marland Kitchen Marital Status:      Family History: The patient's family history includes Alzheimer's disease in his mother; Cancer in his father; Diabetes in his son; Healthy in his daughter; Liver cancer in his father. There is no history of Prostate cancer or CAD.  ROS:   Please see the history of present illness.     All other systems reviewed and are negative.  EKGs/Labs/Other Studies Reviewed:    The following studies were reviewed today:   EKG:  EKG is ordered today.  The ekg ordered today demonstrates normal sinus rhythm with PACs, rate 69, no ST/T abnormalities, Q waves in V2  Recent Labs: 07/31/2019: ALT 4; BUN 24; Creatinine, Ser 1.00; Hemoglobin 10.5; Platelets 273.0; Potassium 4.2; Sodium 138  Recent Lipid Panel    Component Value Date/Time   CHOL 157 07/31/2019 1518   TRIG 71.0 07/31/2019 1518   HDL 38.50 (L) 07/31/2019 1518   CHOLHDL 4 07/31/2019 1518   VLDL 14.2 07/31/2019 1518   LDLCALC 104 (H) 07/31/2019 1518    Physical Exam:    VS:  BP (!) 95/52   Pulse 69   Temp (!) 95 F (35 C)   Ht 5\' 9"  (1.753 m)   Wt 140 lb (63.5 kg)   SpO2 100%   BMI 20.67 kg/m     Wt Readings from Last 3 Encounters:  10/03/19 140 lb (63.5 kg)  08/01/19 142 lb (64.4 kg)  07/31/19 142 lb 2 oz (64.5 kg)    GEN:  in no acute distress HEENT: Normal NECK: No JVD CARDIAC: RRR, no murmurs, rubs, gallops RESPIRATORY:  Clear to auscultation without rales, wheezing or rhonchi  ABDOMEN: Soft, non-tender, non-distended MUSCULOSKELETAL:  Trace edema SKIN: Warm and dry NEUROLOGIC:  Alert and oriented x 3 PSYCHIATRIC:  Normal affect  ASSESSMENT:    1. Syncope and collapse   2. Mitral valve stenosis, unspecified etiology   3. Essential hypertension     PLAN:    Syncope: Description suggestive of syncope and not mechanical falls.  Given sudden syncopal episodes with no prodromal symptoms, concerning for arrhythmia.  Normal orthostatics at last clinic visit.  Echocardiogram on 08/16/2019 showed LVEF 60-65%, normal RV function, mitral annular calcification with mild to moderate mitral stenosis.  30-day cardiac monitor on 09/14/2019 showed 2 episodes of NSVT lasting 6 and 10 beats as well as 8-second episode of SVT and frequent PACs. -Syncopal episodes concerning for arrhythmia, no significant abnormalities on recent cardiac monitor but did not have any episodes while wearing monitor.  Would likely benefit from loop recorder placement.  Will refer to EP for evaluation  Mitral stenosis: mild-moderate MS due to mitral annular calcification on TTE 08/16/2019.  Will repeat TTE in 1 year to monitor  Hypertension: On losartan 100 mg daily.  BP low in clinic today, recommend holding lisinopril as concerned that hypotension could be contributing to his frequent falls.  Advised to check BP daily for next 2 weeks and call with results.  RTC in 3 months   Medication Adjustments/Labs and Tests Ordered: Current medicines are reviewed at length with the patient today.  Concerns regarding medicines are outlined above.  Orders Placed This Encounter  Procedures  . Ambulatory referral to Cardiac Electrophysiology  . EKG 12-Lead   No orders of the defined types were placed in this encounter.   Patient Instructions  Medication Instructions:  Stop Losartan *If you need a refill on your cardiac medications before your next appointment, please call your pharmacy*   Lab Work: none If you have labs (blood work) drawn today and your tests are completely normal, you will receive your results only by: Marland Kitchen MyChart Message (if you have MyChart) OR . A paper copy in the mail If you have any lab test that is abnormal or we need to change your treatment, we will call  you to review the results.   Testing/Procedures: None  Follow-Up: At [facility], you and your health needs are our priority.  As part of our continuing mission to provide you with exceptional heart care, we have created designated Provider Care Teams.  These Care Teams include your primary Cardiologist (physician) and Advanced Practice Providers (APPs -  Physician Assistants and Nurse Practitioners) who all work together to provide you with the care you need, when you need it.  Your next appointment:   3 month(s)  The format for your next appointment:   In Person  Provider:   Oswaldo Milian, MD   Other Instructions Refer to electrophysiology. Check Blood Pressure Daily enter in log. Call office in 2 weeks or send message through [place], Donato Heinz, MD  10/03/2019 10:17 PM    [place]

## 2019-10-02 DIAGNOSIS — R350 Frequency of micturition: Secondary | ICD-10-CM | POA: Diagnosis not present

## 2019-10-02 DIAGNOSIS — Z8551 Personal history of malignant neoplasm of bladder: Secondary | ICD-10-CM | POA: Diagnosis not present

## 2019-10-02 DIAGNOSIS — N3941 Urge incontinence: Secondary | ICD-10-CM | POA: Diagnosis not present

## 2019-10-03 ENCOUNTER — Other Ambulatory Visit: Payer: Self-pay

## 2019-10-03 ENCOUNTER — Encounter: Payer: Self-pay | Admitting: Cardiology

## 2019-10-03 ENCOUNTER — Ambulatory Visit: Payer: Medicare Other | Admitting: Cardiology

## 2019-10-03 VITALS — BP 95/52 | HR 69 | Temp 95.0°F | Ht 69.0 in | Wt 140.0 lb

## 2019-10-03 DIAGNOSIS — R55 Syncope and collapse: Secondary | ICD-10-CM | POA: Diagnosis not present

## 2019-10-03 DIAGNOSIS — I10 Essential (primary) hypertension: Secondary | ICD-10-CM | POA: Diagnosis not present

## 2019-10-03 DIAGNOSIS — I050 Rheumatic mitral stenosis: Secondary | ICD-10-CM | POA: Diagnosis not present

## 2019-10-03 NOTE — Patient Instructions (Signed)
Medication Instructions:  Stop Losartan *If you need a refill on your cardiac medications before your next appointment, please call your pharmacy*   Lab Work: none If you have labs (blood work) drawn today and your tests are completely normal, you will receive your results only by: Marland Kitchen MyChart Message (if you have MyChart) OR . A paper copy in the mail If you have any lab test that is abnormal or we need to change your treatment, we will call you to review the results.   Testing/Procedures: None  Follow-Up: At [facility], you and your health needs are our priority.  As part of our continuing mission to provide you with exceptional heart care, we have created designated Provider Care Teams.  These Care Teams include your primary Cardiologist (physician) and Advanced Practice Providers (APPs -  Physician Assistants and Nurse Practitioners) who all work together to provide you with the care you need, when you need it.  Your next appointment:   3 month(s)  The format for your next appointment:   In Person  Provider:   Oswaldo Milian, MD   Other Instructions Refer to electrophysiology. Check Blood Pressure Daily enter in log. Call office in 2 weeks or send message through [place]

## 2019-10-05 ENCOUNTER — Telehealth: Payer: Self-pay | Admitting: Internal Medicine

## 2019-10-05 NOTE — Telephone Encounter (Signed)
Please call the patient. He was seen in April and at the time he was diagnosed with iron deficiency anemia. Would like to see him sooner than the currently scheduled visit for August, please arrange a visit for next week

## 2019-10-06 NOTE — Telephone Encounter (Signed)
lvm for patient to call back and schedule an appoitnment

## 2019-10-06 NOTE — Telephone Encounter (Signed)
Paz would like to see Pt sometime next week please. If they have questions let me know. (40 mins).

## 2019-10-12 DIAGNOSIS — D229 Melanocytic nevi, unspecified: Secondary | ICD-10-CM | POA: Diagnosis not present

## 2019-10-12 DIAGNOSIS — C44719 Basal cell carcinoma of skin of left lower limb, including hip: Secondary | ICD-10-CM | POA: Diagnosis not present

## 2019-10-12 DIAGNOSIS — L819 Disorder of pigmentation, unspecified: Secondary | ICD-10-CM | POA: Diagnosis not present

## 2019-10-12 DIAGNOSIS — L708 Other acne: Secondary | ICD-10-CM | POA: Diagnosis not present

## 2019-10-12 DIAGNOSIS — D1801 Hemangioma of skin and subcutaneous tissue: Secondary | ICD-10-CM | POA: Diagnosis not present

## 2019-10-16 ENCOUNTER — Ambulatory Visit: Payer: Medicare Other | Admitting: Internal Medicine

## 2019-10-16 ENCOUNTER — Other Ambulatory Visit: Payer: Self-pay

## 2019-10-16 ENCOUNTER — Encounter: Payer: Self-pay | Admitting: Internal Medicine

## 2019-10-16 VITALS — BP 125/65 | HR 79 | Temp 97.7°F | Resp 18 | Ht 69.0 in | Wt 133.5 lb

## 2019-10-16 DIAGNOSIS — R296 Repeated falls: Secondary | ICD-10-CM

## 2019-10-16 DIAGNOSIS — E611 Iron deficiency: Secondary | ICD-10-CM | POA: Diagnosis not present

## 2019-10-16 DIAGNOSIS — R55 Syncope and collapse: Secondary | ICD-10-CM | POA: Diagnosis not present

## 2019-10-16 DIAGNOSIS — D649 Anemia, unspecified: Secondary | ICD-10-CM | POA: Diagnosis not present

## 2019-10-16 NOTE — Progress Notes (Signed)
Subjective:    Patient ID: [name], male    DOB: 06-16-1933, 86 y.o.   MRN: [id number]  DOS:  10/16/2019 Type of visit - description: Follow-up Here at my request due to anemia. He was found to be iron deficiency recently, on iron supplements.  Cardiology note reviewed  Continue with frequent falls, a number of times.  Mostly because he refuses to be helped transferring.  Review of Systems Denies nausea, vomiting.  Occasional diarrhea without blood No GERD symptoms.  No dysphagia or odynophagia. Has occasional lower abdominal discomfort which is chronic since about 2018. For pain very rarely takes 1 Advil.   Past Medical History:  Diagnosis Date   Allergic rhinitis    Anxiety    Bladder cancer [facility])    urologist-  dr Junious Silk--  low grade TA   BPH with elevated PSA    Complication of anesthesia    "paranoid after back surgery in october lasted 2 to 3 days"   DDD (degenerative disc disease), lumbar    Depression    Dry mouth    USES BIOTIN SPRAY/ MOUTHWASH   Frequency of urination    GERD (gastroesophageal reflux disease)    Hearing loss    does not wear his hearing aid   History of kidney stones    10/1989   Hyperlipidemia    Hypertension    Mild obstructive sleep apnea    PER PT  MILD OSA , NO CPAP RX,  STUDY DONE 2009   Nocturia    OAB (overactive bladder)    Osteoarthritis    KNEES, SHOULDER   Parkinson disease ([place]) DX   2005   NEUROLOGIST-   DR TAT--  IDIOPATHIC PARKINSON'S /  TREMORS CONTROLLED WITH MEDS   Urge urinary incontinence     Past Surgical History:  Procedure Laterality Date   CATARACT EXTRACTION W/ INTRAOCULAR LENS  IMPLANT, BILATERAL  2014   CYSTOSCOPY Bilateral 04/09/2017   Procedure: CYSTOSCOPY/ RETROGRADE;  Surgeon: Festus Aloe, MD;  [facility];  Service: Urology;  Laterality: Bilateral;   CYSTOSCOPY W/ RETROGRADES Bilateral 12/14/2014   Procedure: CYSTOSCOPY BLADDER BIOPSY FULGERATION MITOMYCIN C  BILATERAL RETROGRADE PYELOGRAM;  Surgeon: Festus Aloe, MD;  Location: [facility];  Service: Urology;  Laterality: Bilateral;   CYSTOSCOPY WITH STENT PLACEMENT Left 12/19/2013   Procedure: CYSTOSCOPY BLADDER BX, LEFT URETERAL STENT PLACEMENT , LEFT RETROGRADE AND PYLOGRAM;  Surgeon: Festus Aloe, MD;  Location[facility];  Service: Urology;  Laterality: Left;   EYE SURGERY Bilateral    ioc for cataract   HERNIA REPAIR  2013   inguinal   INGUINAL HERNIA REPAIR  03/09/2012   Procedure: HERNIA REPAIR INGUINAL ADULT;  Surgeon: Adin Hector, MD;  [facility];  Service: General;  Laterality: Right;   INSERTION OF MESH  03/09/2012   Procedure: INSERTION OF MESH;  Surgeon: Adin Hector, MD;  [facility];  Service: General;  Laterality: Right;   LUMBAR LAMINECTOMY/DECOMPRESSION MICRODISCECTOMY N/A 02/05/2017   Procedure: Laminectomy and Foraminotomy - Lumbar One-Two,Lumbar Two-Three, Lumbar Three-Four.;  Surgeon: Eustace Moore, MD;  Location: [place];  Service: Neurosurgery;  Laterality: N/A;   NASAL SINUS SURGERY  1982   ORIF RIGHT ARM FX  1949   HARDWARE REMOVED    REMOVAL BENIGN CYST LEFT FOREHEAD  1969   TRANSURETHRAL RESECTION OF BLADDER TUMOR N/A 12/19/2013   Procedure:  TRANSURETHRAL RESECTION OF BLADDER TUMOR WITH GYRUS (TURBT-GYRUS);  Surgeon: Festus Aloe, MD;  Location: [place];  Service: Urology;  Laterality: N/A;   TRANSURETHRAL RESECTION OF BLADDER TUMOR N/A 04/09/2017   Procedure: TRANSURETHRAL RESECTION OF BLADDER TUMOR / (TURBT) 2-5cm;  Surgeon: Festus Aloe, MD;  [facility];  Service: Urology;  Laterality: N/A;  ONLY NEEDS 90 MIN FOR BOTH PROCEDURES    Allergies as of 10/16/2019      Reactions   Pollen Extract Other (See Comments)   Other Other (See Comments)   Paranoia from anesthesia drug last visit-unknown drug       Medication List       Accurate as of October 16, 2019 11:59  PM. If you have any questions, ask your nurse or doctor.        STOP taking these medications   donepezil 10 MG tablet Commonly known as: ARICEPT Stopped by: [name], MD   donepezil 5 MG tablet Commonly known as: ARICEPT Stopped by: [name], MD   fexofenadine 180 MG tablet Commonly known as: ALLEGRA Stopped by: [name], MD   PROBIOTIC DAILY PO Stopped by: [name], MD   VESIcare 10 MG tablet Generic drug: solifenacin Stopped by: [name], MD     TAKE these medications   acetaminophen 500 MG tablet Commonly known as: TYLENOL Take 500-1,000 mg by mouth See admin instructions. 1000 mg in the morning, 500 mg at noon, 500 mg at 1600, and 1000 mg at night   ALPRAZolam 0.25 MG tablet Commonly known as: Xanax Take 1 tablet (0.25 mg total) by mouth 3 (three) times daily as needed for anxiety.   bacitracin ointment Apply 1 application topically 2 (two) times daily.   carbidopa-levodopa 50-200 MG tablet Commonly known as: SINEMET CR TAKE 1 TABLET BY MOUTH EVERYDAY AT BEDTIME   carbidopa-levodopa 25-100 MG tablet Commonly known as: SINEMET IR TAKE TWO TABLETS BY MOUTH FOUR TIMES DAILY   docusate sodium 50 MG capsule Commonly known as: COLACE Take 50-100 mg by mouth See admin instructions. Take 100 mg in the morning and 50 mg at 4pm   fluticasone 50 MCG/ACT nasal spray Commonly known as: FLONASE Place 2 sprays into both nostrils daily as needed for allergies or rhinitis.   ibuprofen 200 MG tablet Commonly known as: ADVIL Take 400 mg by mouth every 8 (eight) hours as needed.   MULTIVITAMIN ADULT PO Take by mouth daily.   Myrbetriq 25 MG Tb24 tablet Generic drug: mirabegron ER Take 50 mg by mouth daily. At 1600   polyethylene glycol 17 g packet Commonly known as: MIRALAX / GLYCOLAX Take 17 g by mouth daily as needed for mild constipation.   REFRESH OP Place 1-2 drops into both eyes daily as needed (dry eyes).   ROLAIDS PO Take 2 tablets by mouth as needed  (indigestion).   sertraline 100 MG tablet Commonly known as: ZOLOFT Take 1.5 tablets (150 mg total) by mouth daily.   tamsulosin 0.4 MG Caps capsule Commonly known as: FLOMAX Take 0.4 mg by mouth daily after supper.          Objective:   Physical Exam BP 125/65 (BP Location: Left Arm, Patient Position: Sitting, Cuff Size: Small)    Pulse 79    Temp 97.7 F (36.5 C) (Temporal)    Resp 18    Ht 5\' 9"  (1.753 m)    Wt 133 lb 8 oz (60.6 kg)    SpO2 98%    BMI 19.71 kg/m  General:   Well developed, NAD, chronically ill-appearing HEENT:  Normocephalic .  Face symmetric, atraumatic Abdomen: Soft, not tender Lower extremities: no pretibial edema bilaterally  Skin: Not pale. Not jaundice Neurologic:  alert & oriented X3.  Speech normal, gait not tested Psych--  Cognition and judgment appear intact.  Cooperative with normal attention span and concentration.  Behavior appropriate. No anxious or depressed appearing.      Assessment     Assessment HTN Hyperlipidemia Depression - anxiety: Lexapro intolerant 10-2016 OSA, mild per remote sleep study (~2008?), no CPAP Parkinson disease Dr. Carles Collet Dementia, mild GU: Dr. Junious Silk --BPH, elevated PSA --Bladder cancer-low-grade --OAB, incontinence  -- E.D. Dysphagia, MBE 09-2013 normal Dry mouth  DJD- back pain, sees Dr Nelva Bush, s/p local injections, on prn hydrocodone, surgery 2018 L > R LE edema: [place] (-) DVT 04-2016 [facility] Mitral stenosis: Mild to moderate per echo 07/2019  PLAN: Iron deficiency: last Hg  10.5, lower than before, iron levels consistent with iron deficiency.  GI ROS is benign. Last colonoscopy was in 2010.  Hardly ever take a NSAID, he is currently taking iron without apparent problems. Advised patient standard of care in the situation is a GI evaluation since etiology can be malignancy specifically colon cancer or stomach cancer. At the same time I advised with his overall health putting him through procedures would be  difficult. He will let me know if he is interested on further eval, if that is the case will refer to GI for procedures consideration. In the meantime we will check labs, continue iron Frequent falls: Patient continued to have frequent falls, the wife and a caregiver are here, they report that most of the falls are mechanical because he refuses help to transfer.  I strongly advised patient to accept the help, consequences of a fall can be quite severe.  He is a very independent person, listening therapy provided, counseled the best I could. Dementia: Refused to take Aricept due to potential for side effects. Syncope: Saw cardiology 10/05/2019, symptoms suspicious for arrhythmia, referred to EP. Also d/c losartan (overcontrolled BP?).  Recommend to check BPs Skin biopsy: At the left leg, done last week at dermatology, results pending. RTC 4 months   Today I spent more than 35 minutes with the patient  This visit occurred during the SARS-CoV-2 public health emergency.  Safety protocols were in place, including screening questions prior to the visit, additional usage of staff PPE, and extensive cleaning of exam room while observing appropriate contact time as indicated for disinfecting solutions.

## 2019-10-16 NOTE — Patient Instructions (Signed)
Check your blood pressure daily BP GOAL is between 110/65 and  135/85. If it is consistently higher or lower, let me know  Continue taking iron on an empty stomach, you may like to take it with a vitamin C supplement   GO TO THE LAB : Get the blood work     [place], [place] back for for a checkup in 4 months

## 2019-10-16 NOTE — Progress Notes (Signed)
Pre visit review using our clinic review tool, if applicable. No additional management support is needed unless otherwise documented below in the visit note. 

## 2019-10-17 LAB — CBC WITH DIFFERENTIAL/PLATELET
Basophils Absolute: 0.1 10*3/uL (ref 0.0–0.1)
Basophils Relative: 1 % (ref 0.0–3.0)
Eosinophils Absolute: 0.2 10*3/uL (ref 0.0–0.7)
Eosinophils Relative: 3.1 % (ref 0.0–5.0)
HCT: 37.4 % — ABNORMAL LOW (ref 39.0–52.0)
Hemoglobin: 12.5 g/dL — ABNORMAL LOW (ref 13.0–17.0)
Lymphocytes Relative: 16.9 % (ref 12.0–46.0)
Lymphs Abs: 0.9 10*3/uL (ref 0.7–4.0)
MCHC: 33.5 g/dL (ref 30.0–36.0)
MCV: 96.3 fl (ref 78.0–100.0)
Monocytes Absolute: 0.6 10*3/uL (ref 0.1–1.0)
Monocytes Relative: 11.1 % (ref 3.0–12.0)
Neutro Abs: 3.6 10*3/uL (ref 1.4–7.7)
Neutrophils Relative %: 67.9 % (ref 43.0–77.0)
Platelets: 265 10*3/uL (ref 150.0–400.0)
RBC: 3.88 Mil/uL — ABNORMAL LOW (ref 4.22–5.81)
RDW: 14 % (ref 11.5–15.5)
WBC: 5.3 10*3/uL (ref 4.0–10.5)

## 2019-10-17 LAB — IRON,TIBC AND FERRITIN PANEL
%SAT: 34 % (calc) (ref 20–48)
Ferritin: 271 ng/mL (ref 24–380)
Iron: 89 ug/dL (ref 50–180)
TIBC: 264 mcg/dL (calc) (ref 250–425)

## 2019-10-17 NOTE — Assessment & Plan Note (Signed)
Iron deficiency: last Hg  10.5, lower than before, iron levels consistent with iron deficiency.  GI ROS is benign. Last colonoscopy was in 2010.  Hardly ever take a NSAID, he is currently taking iron without apparent problems. Advised patient standard of care in the situation is a GI evaluation since etiology can be malignancy specifically colon cancer or stomach cancer. At the same time I advised with his overall health putting him through procedures would be difficult. He will let me know if he is interested on further eval, if that is the case will refer to GI for procedures consideration. In the meantime we will check labs, continue iron Frequent falls: Patient continued to have frequent falls, the wife and a caregiver are here, they report that most of the falls are mechanical because he refuses help to transfer.  I strongly advised patient to accept the help, consequences of a fall can be quite severe.  He is a very independent person, listening therapy provided, counseled the best I could. Dementia: Refused to take Aricept due to potential for side effects. Syncope: Saw cardiology 10/05/2019, symptoms suspicious for arrhythmia, referred to EP. Also d/c losartan (overcontrolled BP?).  Recommend to check BPs Skin biopsy: At the left leg, done last week at dermatology, results pending. RTC 4 months

## 2019-10-25 ENCOUNTER — Encounter: Payer: Self-pay | Admitting: Internal Medicine

## 2019-10-28 ENCOUNTER — Inpatient Hospital Stay (HOSPITAL_COMMUNITY)
Admission: EM | Admit: 2019-10-28 | Discharge: 2019-11-02 | DRG: 184 | Disposition: A | Discharge status: Hospice | Payer: Medicare Other | Attending: Internal Medicine | Admitting: Internal Medicine

## 2019-10-28 ENCOUNTER — Emergency Department (HOSPITAL_COMMUNITY): Payer: Medicare Other

## 2019-10-28 DIAGNOSIS — S32029A Unspecified fracture of second lumbar vertebra, initial encounter for closed fracture: Secondary | ICD-10-CM | POA: Diagnosis present

## 2019-10-28 DIAGNOSIS — N401 Enlarged prostate with lower urinary tract symptoms: Secondary | ICD-10-CM | POA: Diagnosis present

## 2019-10-28 DIAGNOSIS — R490 Dysphonia: Secondary | ICD-10-CM | POA: Diagnosis not present

## 2019-10-28 DIAGNOSIS — R630 Anorexia: Secondary | ICD-10-CM | POA: Diagnosis present

## 2019-10-28 DIAGNOSIS — M4186 Other forms of scoliosis, lumbar region: Secondary | ICD-10-CM | POA: Diagnosis present

## 2019-10-28 DIAGNOSIS — S32119A Unspecified Zone I fracture of sacrum, initial encounter for closed fracture: Secondary | ICD-10-CM | POA: Diagnosis present

## 2019-10-28 DIAGNOSIS — S32039A Unspecified fracture of third lumbar vertebra, initial encounter for closed fracture: Secondary | ICD-10-CM | POA: Diagnosis not present

## 2019-10-28 DIAGNOSIS — N400 Enlarged prostate without lower urinary tract symptoms: Secondary | ICD-10-CM | POA: Diagnosis present

## 2019-10-28 DIAGNOSIS — F419 Anxiety disorder, unspecified: Secondary | ICD-10-CM | POA: Diagnosis present

## 2019-10-28 DIAGNOSIS — M5136 Other intervertebral disc degeneration, lumbar region: Secondary | ICD-10-CM | POA: Diagnosis present

## 2019-10-28 DIAGNOSIS — S79922A Unspecified injury of left thigh, initial encounter: Secondary | ICD-10-CM | POA: Diagnosis not present

## 2019-10-28 DIAGNOSIS — Y92018 Other place in single-family (private) house as the place of occurrence of the external cause: Secondary | ICD-10-CM

## 2019-10-28 DIAGNOSIS — S32049A Unspecified fracture of fourth lumbar vertebra, initial encounter for closed fracture: Secondary | ICD-10-CM | POA: Diagnosis not present

## 2019-10-28 DIAGNOSIS — N3281 Overactive bladder: Secondary | ICD-10-CM | POA: Diagnosis present

## 2019-10-28 DIAGNOSIS — S0101XA Laceration without foreign body of scalp, initial encounter: Secondary | ICD-10-CM | POA: Diagnosis present

## 2019-10-28 DIAGNOSIS — R55 Syncope and collapse: Secondary | ICD-10-CM | POA: Diagnosis present

## 2019-10-28 DIAGNOSIS — G8929 Other chronic pain: Secondary | ICD-10-CM | POA: Diagnosis present

## 2019-10-28 DIAGNOSIS — G909 Disorder of the autonomic nervous system, unspecified: Secondary | ICD-10-CM | POA: Diagnosis present

## 2019-10-28 DIAGNOSIS — Z87442 Personal history of urinary calculi: Secondary | ICD-10-CM

## 2019-10-28 DIAGNOSIS — I499 Cardiac arrhythmia, unspecified: Secondary | ICD-10-CM | POA: Diagnosis not present

## 2019-10-28 DIAGNOSIS — M1990 Unspecified osteoarthritis, unspecified site: Secondary | ICD-10-CM | POA: Diagnosis present

## 2019-10-28 DIAGNOSIS — I493 Ventricular premature depolarization: Secondary | ICD-10-CM | POA: Diagnosis not present

## 2019-10-28 DIAGNOSIS — E440 Moderate protein-calorie malnutrition: Secondary | ICD-10-CM | POA: Diagnosis present

## 2019-10-28 DIAGNOSIS — Z515 Encounter for palliative care: Secondary | ICD-10-CM

## 2019-10-28 DIAGNOSIS — S2243XA Multiple fractures of ribs, bilateral, initial encounter for closed fracture: Secondary | ICD-10-CM | POA: Diagnosis not present

## 2019-10-28 DIAGNOSIS — R52 Pain, unspecified: Secondary | ICD-10-CM

## 2019-10-28 DIAGNOSIS — G20 Parkinson's disease: Secondary | ICD-10-CM | POA: Diagnosis present

## 2019-10-28 DIAGNOSIS — K8020 Calculus of gallbladder without cholecystitis without obstruction: Secondary | ICD-10-CM | POA: Diagnosis present

## 2019-10-28 DIAGNOSIS — K219 Gastro-esophageal reflux disease without esophagitis: Secondary | ICD-10-CM | POA: Diagnosis present

## 2019-10-28 DIAGNOSIS — Z833 Family history of diabetes mellitus: Secondary | ICD-10-CM

## 2019-10-28 DIAGNOSIS — R296 Repeated falls: Secondary | ICD-10-CM | POA: Diagnosis present

## 2019-10-28 DIAGNOSIS — S2242XA Multiple fractures of ribs, left side, initial encounter for closed fracture: Secondary | ICD-10-CM

## 2019-10-28 DIAGNOSIS — G4733 Obstructive sleep apnea (adult) (pediatric): Secondary | ICD-10-CM | POA: Diagnosis present

## 2019-10-28 DIAGNOSIS — E785 Hyperlipidemia, unspecified: Secondary | ICD-10-CM | POA: Diagnosis present

## 2019-10-28 DIAGNOSIS — R451 Restlessness and agitation: Secondary | ICD-10-CM | POA: Diagnosis not present

## 2019-10-28 DIAGNOSIS — S199XXA Unspecified injury of neck, initial encounter: Secondary | ICD-10-CM | POA: Diagnosis not present

## 2019-10-28 DIAGNOSIS — Z820 Family history of epilepsy and other diseases of the nervous system: Secondary | ICD-10-CM

## 2019-10-28 DIAGNOSIS — Z79899 Other long term (current) drug therapy: Secondary | ICD-10-CM

## 2019-10-28 DIAGNOSIS — F0280 Dementia in other diseases classified elsewhere without behavioral disturbance: Secondary | ICD-10-CM | POA: Diagnosis present

## 2019-10-28 DIAGNOSIS — S3210XA Unspecified fracture of sacrum, initial encounter for closed fracture: Secondary | ICD-10-CM

## 2019-10-28 DIAGNOSIS — W19XXXA Unspecified fall, initial encounter: Secondary | ICD-10-CM

## 2019-10-28 DIAGNOSIS — S32019A Unspecified fracture of first lumbar vertebra, initial encounter for closed fracture: Secondary | ICD-10-CM | POA: Diagnosis present

## 2019-10-28 DIAGNOSIS — Z8551 Personal history of malignant neoplasm of bladder: Secondary | ICD-10-CM

## 2019-10-28 DIAGNOSIS — F329 Major depressive disorder, single episode, unspecified: Secondary | ICD-10-CM | POA: Diagnosis present

## 2019-10-28 DIAGNOSIS — G20A1 Parkinson's disease without dyskinesia, without mention of fluctuations: Secondary | ICD-10-CM | POA: Diagnosis present

## 2019-10-28 DIAGNOSIS — Z7401 Bed confinement status: Secondary | ICD-10-CM

## 2019-10-28 DIAGNOSIS — S2232XA Fracture of one rib, left side, initial encounter for closed fracture: Secondary | ICD-10-CM | POA: Diagnosis not present

## 2019-10-28 DIAGNOSIS — I10 Essential (primary) hypertension: Secondary | ICD-10-CM | POA: Diagnosis not present

## 2019-10-28 DIAGNOSIS — S79921A Unspecified injury of right thigh, initial encounter: Secondary | ICD-10-CM | POA: Diagnosis not present

## 2019-10-28 DIAGNOSIS — R1313 Dysphagia, pharyngeal phase: Secondary | ICD-10-CM | POA: Diagnosis present

## 2019-10-28 DIAGNOSIS — Z87891 Personal history of nicotine dependence: Secondary | ICD-10-CM

## 2019-10-28 DIAGNOSIS — Z800 Family history of malignant neoplasm of digestive organs: Secondary | ICD-10-CM

## 2019-10-28 DIAGNOSIS — Z66 Do not resuscitate: Secondary | ICD-10-CM | POA: Diagnosis present

## 2019-10-28 DIAGNOSIS — F32A Depression, unspecified: Secondary | ICD-10-CM | POA: Diagnosis present

## 2019-10-28 DIAGNOSIS — W109XXA Fall (on) (from) unspecified stairs and steps, initial encounter: Secondary | ICD-10-CM | POA: Diagnosis present

## 2019-10-28 DIAGNOSIS — S32009A Unspecified fracture of unspecified lumbar vertebra, initial encounter for closed fracture: Secondary | ICD-10-CM

## 2019-10-28 DIAGNOSIS — M25462 Effusion, left knee: Secondary | ICD-10-CM | POA: Diagnosis not present

## 2019-10-28 DIAGNOSIS — Z681 Body mass index (BMI) 19 or less, adult: Secondary | ICD-10-CM

## 2019-10-28 DIAGNOSIS — R54 Age-related physical debility: Secondary | ICD-10-CM | POA: Diagnosis present

## 2019-10-28 DIAGNOSIS — Z20822 Contact with and (suspected) exposure to covid-19: Secondary | ICD-10-CM | POA: Diagnosis present

## 2019-10-28 DIAGNOSIS — D72829 Elevated white blood cell count, unspecified: Secondary | ICD-10-CM | POA: Diagnosis present

## 2019-10-28 DIAGNOSIS — R627 Adult failure to thrive: Secondary | ICD-10-CM | POA: Diagnosis present

## 2019-10-28 LAB — COMPREHENSIVE METABOLIC PANEL
ALT: 6 U/L (ref 0–44)
AST: 17 U/L (ref 15–41)
Albumin: 3.9 g/dL (ref 3.5–5.0)
Alkaline Phosphatase: 94 U/L (ref 38–126)
Anion gap: 7 (ref 5–15)
BUN: 19 mg/dL (ref 8–23)
CO2: 28 mmol/L (ref 22–32)
Calcium: 9.1 mg/dL (ref 8.9–10.3)
Chloride: 106 mmol/L (ref 98–111)
Creatinine, Ser: 0.99 mg/dL (ref 0.61–1.24)
GFR calc Af Amer: >60 mL/min (ref >60)
GFR calc non Af Amer: >60 mL/min (ref >60)
Glucose, Bld: 106 mg/dL — ABNORMAL HIGH (ref 70–99)
Potassium: 4.3 mmol/L (ref 3.5–5.1)
Sodium: 141 mmol/L (ref 135–145)
Total Bilirubin: 1.4 mg/dL — ABNORMAL HIGH (ref 0.3–1.2)
Total Protein: 6.3 g/dL — ABNORMAL LOW (ref 6.5–8.1)

## 2019-10-28 LAB — URINALYSIS, ROUTINE W REFLEX MICROSCOPIC
Bacteria, UA: NONE SEEN
Bilirubin Urine: NEGATIVE
Glucose, UA: NEGATIVE mg/dL
Ketones, ur: 5 mg/dL — AB
Leukocytes,Ua: NEGATIVE
Nitrite: NEGATIVE
Protein, ur: NEGATIVE mg/dL
Specific Gravity, Urine: 1.038 — ABNORMAL HIGH (ref 1.005–1.030)
pH: 5 (ref 5.0–8.0)

## 2019-10-28 LAB — CBC WITH DIFFERENTIAL/PLATELET
Abs Immature Granulocytes: 0.21 10*3/uL — ABNORMAL HIGH (ref 0.00–0.07)
Basophils Absolute: 0.1 10*3/uL (ref 0.0–0.1)
Basophils Relative: 1 %
Eosinophils Absolute: 0.2 10*3/uL (ref 0.0–0.5)
Eosinophils Relative: 1 %
HCT: 40.3 % (ref 39.0–52.0)
Hemoglobin: 13.1 g/dL (ref 13.0–17.0)
Immature Granulocytes: 2 %
Lymphocytes Relative: 7 %
Lymphs Abs: 0.9 10*3/uL (ref 0.7–4.0)
MCH: 31.6 pg (ref 26.0–34.0)
MCHC: 32.5 g/dL (ref 30.0–36.0)
MCV: 97.3 fL (ref 80.0–100.0)
Monocytes Absolute: 0.8 10*3/uL (ref 0.1–1.0)
Monocytes Relative: 5 %
Neutro Abs: 12.1 10*3/uL — ABNORMAL HIGH (ref 1.7–7.7)
Neutrophils Relative %: 84 %
Platelets: 209 10*3/uL (ref 150–400)
RBC: 4.14 MIL/uL — ABNORMAL LOW (ref 4.22–5.81)
RDW: 13.2 % (ref 11.5–15.5)
WBC: 14.2 10*3/uL — ABNORMAL HIGH (ref 4.0–10.5)
nRBC: 0 % (ref 0.0–0.2)

## 2019-10-28 LAB — I-STAT CHEM 8, ED
BUN: 20 mg/dL (ref 8–23)
Calcium, Ion: 1.2 mmol/L (ref 1.15–1.40)
Chloride: 102 mmol/L (ref 98–111)
Creatinine, Ser: 0.9 mg/dL (ref 0.61–1.24)
Glucose, Bld: 100 mg/dL — ABNORMAL HIGH (ref 70–99)
HCT: 38 % — ABNORMAL LOW (ref 39.0–52.0)
Hemoglobin: 12.9 g/dL — ABNORMAL LOW (ref 13.0–17.0)
Potassium: 3.7 mmol/L (ref 3.5–5.1)
Sodium: 141 mmol/L (ref 135–145)
TCO2: 25 mmol/L (ref 22–32)

## 2019-10-28 LAB — CBG MONITORING, ED: Glucose-Capillary: 81 mg/dL (ref 70–99)

## 2019-10-28 LAB — SAMPLE TO BLOOD BANK

## 2019-10-28 LAB — ETHANOL: Alcohol, Ethyl (B): <10 mg/dL (ref <10)

## 2019-10-28 LAB — LACTIC ACID, PLASMA: Lactic Acid, Venous: 1.1 mmol/L (ref 0.5–1.9)

## 2019-10-28 MED ORDER — FENTANYL CITRATE (PF) 100 MCG/2ML IJ SOLN
50.0000 ug | Freq: Once | INTRAMUSCULAR | Status: AC
  Administered 2019-10-28: 50 ug via INTRAVENOUS
  Filled 2019-10-28: qty 2

## 2019-10-28 MED ORDER — CARBIDOPA-LEVODOPA ER 50-200 MG PO TBCR
1.0000 | EXTENDED_RELEASE_TABLET | Freq: Once | ORAL | Status: AC
  Administered 2019-10-28: 1 via ORAL
  Filled 2019-10-28: qty 1

## 2019-10-28 MED ORDER — IOHEXOL 350 MG/ML SOLN
100.0000 mL | Freq: Once | INTRAVENOUS | Status: AC | PRN
  Administered 2019-10-28: 100 mL via INTRAVENOUS

## 2019-10-28 MED ORDER — LIDOCAINE-EPINEPHRINE-TETRACAINE (LET) TOPICAL GEL
3.0000 mL | Freq: Once | TOPICAL | Status: AC
  Administered 2019-10-28: 3 mL via TOPICAL
  Filled 2019-10-28: qty 3

## 2019-10-28 MED ORDER — SODIUM CHLORIDE 0.9 % IV BOLUS
500.0000 mL | Freq: Once | INTRAVENOUS | Status: AC
  Administered 2019-10-28: 500 mL via INTRAVENOUS

## 2019-10-28 NOTE — ED Provider Notes (Signed)
[place] EMERGENCY DEPARTMENT Provider Note   CSN: [id number] Arrival date & time: 10/28/19  1935     History Chief Complaint  Patient presents with  . Fall    [name] is a 86 y.o. male with past medical history significant for Parkinson's, [place], hypertension, hyperlipidemia, recurrent syncope who presents for evaluation after mechanical fall.  Family states he walks with a walker at baseline and needs help.  They thought he was in the restroom when they saw him walking up the stairs.  Typically family has to walk with him as he is unstable on his feet due to his Parkinson's.  Family states they watched him fall backwards down a flight of stairs approximately 11-12 steps.  Has history of chronic back pain however states his back pain is worsening.  No shortening or rotation of legs.  He does have laceration to back of scalp.  Patient left-sided rib pain, back pain.  Patient denies syncope, prior sudden onset thunderclap headache, weakness, chest pain or shortness of breath.  Denies additional aggravating or alleviating factors.  Unknown last tetanus.  Is currently being followed by cardiology for recurrent syncope.  Is post to get a loop implant recorder as his last Holter monitor did not show any arrhythmias and he did not have any syncopal events during this time. Denies anticoagulation.  History obtained from patient past medical records.  No interpreter is used.  HPI     Past Medical History:  Diagnosis Date  . Allergic rhinitis   . Anxiety   . Bladder cancer [facility]    urologist-  dr eskridge--  low grade TA  . BPH with elevated PSA   . Complication of anesthesia    "paranoid after back surgery in october lasted 2 to 3 days"  . DDD (degenerative disc disease), lumbar   . Depression   . Dry mouth    USES BIOTIN SPRAY/ MOUTHWASH  . Frequency of urination   . GERD (gastroesophageal reflux disease)   . Hearing loss    does not wear his hearing aid  .  History of kidney stones    10/1989  . Hyperlipidemia   . Hypertension   . Mild obstructive sleep apnea    PER PT  MILD OSA , NO CPAP RX,  STUDY DONE 2009  . Nocturia   . OAB (overactive bladder)   . Osteoarthritis    KNEES, SHOULDER  . Parkinson disease ([place]) DX   2005   NEUROLOGIST-   DR TAT--  IDIOPATHIC PARKINSON'S /  TREMORS CONTROLLED WITH MEDS  . Urge urinary incontinence     Patient Active Problem List   Diagnosis Date Noted  . BPH (benign prostatic hyperplasia) 08/21/2018  . OAB (overactive bladder) 08/21/2018  . S/P lumbar laminectomy 02/05/2017  . PCP NOTES >>>>>>>>>>>>>>>>>> 11/19/2015  . Dementia due to Parkinson's disease without behavioral disturbance ([place]) 11/07/2015  . PD (Parkinson's disease) ([place]) 11/07/2015  . Bladder cancer ([place]) 04/26/2014  . Right inguinal hernia 09/10/2011  . Direct inguinal hernia 08/07/2011  . Urinary frequency 03/18/2011  . Skin lesion 03/18/2011  . General medical examination 03/18/2011  . Fatigue 10/14/2010  . RHINITIS 02/12/2010  . GERD 12/03/2009  . Depression 08/28/2009  . WEIGHT LOSS 08/28/2009  . PSA, INCREASED 06/11/2009  . Back pain 01/09/2009  . NEOPLASM OF UNCERTAIN BEHAVIOR OF SKIN 07/05/2008  . Hyperlipidemia 02/28/2007  . ERECTILE DYSFUNCTION 02/28/2007  . Essential hypertension 02/28/2007  . Osteoarthritis 02/28/2007    Past  Surgical History:  Procedure Laterality Date  . CATARACT EXTRACTION W/ INTRAOCULAR LENS  IMPLANT, BILATERAL  2014  . CYSTOSCOPY Bilateral 04/09/2017   Procedure: CYSTOSCOPY/ RETROGRADE;  Surgeon: Festus Aloe, MD;  Location: [facility];  Service: Urology;  Laterality: Bilateral;  . CYSTOSCOPY W/ RETROGRADES Bilateral 12/14/2014   Procedure: CYSTOSCOPY BLADDER BIOPSY FULGERATION MITOMYCIN C BILATERAL RETROGRADE PYELOGRAM;  Surgeon: Festus Aloe, MD;  Location: [facility];  Service: Urology;  Laterality: Bilateral;  . CYSTOSCOPY WITH STENT PLACEMENT Left 12/19/2013    Procedure: CYSTOSCOPY BLADDER BX, LEFT URETERAL STENT PLACEMENT , LEFT RETROGRADE AND PYLOGRAM;  Surgeon: Festus Aloe, MD;  Location: [facility];  Service: Urology;  Laterality: Left;  . EYE SURGERY Bilateral    ioc for cataract  . HERNIA REPAIR  2013   inguinal  . INGUINAL HERNIA REPAIR  03/09/2012   Procedure: HERNIA REPAIR INGUINAL ADULT;  Surgeon: Adin Hector, MD;  Location: [facility];  Service: General;  Laterality: Right;  . INSERTION OF MESH  03/09/2012   Procedure: INSERTION OF MESH;  Surgeon: Adin Hector, MD;  Location: [facility];  Service: General;  Laterality: Right;  . LUMBAR LAMINECTOMY/DECOMPRESSION MICRODISCECTOMY N/A 02/05/2017   Procedure: Laminectomy and Foraminotomy - Lumbar One-Two,Lumbar Two-Three, Lumbar Three-Four.;  Surgeon: Eustace Moore, MD;  Location: [facility];  Service: Neurosurgery;  Laterality: N/A;  . NASAL SINUS SURGERY  1982  . ORIF RIGHT ARM FX  1949   HARDWARE REMOVED   . REMOVAL BENIGN CYST LEFT FOREHEAD  1969  . TRANSURETHRAL RESECTION OF BLADDER TUMOR N/A 12/19/2013   Procedure:  TRANSURETHRAL RESECTION OF BLADDER TUMOR WITH GYRUS (TURBT-GYRUS);  Surgeon: Festus Aloe, MD;  Location: [facility];  Service: Urology;  Laterality: N/A;  . TRANSURETHRAL RESECTION OF BLADDER TUMOR N/A 04/09/2017   Procedure: TRANSURETHRAL RESECTION OF BLADDER TUMOR / (TURBT) 2-5cm;  Surgeon: Festus Aloe, MD;  Location: [facility];  Service: Urology;  Laterality: N/A;  ONLY NEEDS 90 MIN FOR BOTH PROCEDURES       Family History  Problem Relation Age of Onset  . Alzheimer's disease Mother   . Liver cancer Father   . Cancer Father        colon  . Healthy Daughter   . Diabetes Son   . Prostate cancer Neg Hx   . CAD Neg Hx     Social History   Tobacco Use  . Smoking status: Former Smoker    Packs/day: 1.00    Years: 15.00    Pack years: 15.00    Types: Cigarettes    Quit date: 04/20/1968    Years since quitting: 51.5  .  Smokeless tobacco: Never Used  Vaping Use  . Vaping Use: Never used  Substance Use Topics  . Alcohol use: Yes    Comment: occassional glass of wine   . Drug use: No    Home Medications Prior to Admission medications   Medication Sig Start Date End Date Taking? Authorizing Provider  ALPRAZolam (XANAX) 0.25 MG tablet Take 1 tablet (0.25 mg total) by mouth 3 (three) times daily as needed for anxiety. 09/27/19  Yes Paz, Alda Berthold, MD  bacitracin ointment Apply 1 application topically 2 (two) times daily. 07/19/19  Yes Varney Biles, MD  Ca Carbonate-Mag Hydroxide (ROLAIDS PO) Take 2 tablets by mouth as needed (indigestion).    Yes [provider]  carbidopa-levodopa (SINEMET CR) 50-200 MG tablet TAKE 1 TABLET BY MOUTH EVERYDAY AT BEDTIME Patient taking differently: Take 1  tablet by mouth at bedtime.  06/30/19  Yes Tat, Eustace Quail, DO  carbidopa-levodopa (SINEMET IR) 25-100 MG tablet TAKE TWO TABLETS BY MOUTH FOUR TIMES DAILY Patient taking differently: Take 2 tablets by mouth 4 (four) times daily.  09/27/19  Yes Tat, Eustace Quail, DO  docusate sodium (COLACE) 50 MG capsule Take 50-100 mg by mouth See admin instructions. Take 100 mg in the morning and 50 mg at 4pm   Yes [provider]  fluticasone (FLONASE) 50 MCG/ACT nasal spray Place 2 sprays into both nostrils daily as needed for allergies or rhinitis. 12/29/18  Yes Paz, Alda Berthold, MD  ibuprofen (ADVIL,MOTRIN) 200 MG tablet Take 400 mg by mouth every 8 (eight) hours as needed for fever or mild pain.    Yes [provider]  Multiple Vitamin (MULTIVITAMIN WITH MINERALS) TABS tablet Take 1 tablet by mouth daily.   Yes [provider]  MYRBETRIQ 25 MG TB24 tablet Take 50 mg by mouth daily at 4 PM.  02/24/16  Yes [provider]  polyethylene glycol (MIRALAX / GLYCOLAX) packet Take 17 g by mouth daily as needed for mild constipation.    Yes [provider]  Polyvinyl Alcohol-Povidone (REFRESH OP) Place 1-2  drops into both eyes daily as needed (dry eyes).   Yes [provider]  sertraline (ZOLOFT) 100 MG tablet Take 1.5 tablets (150 mg total) by mouth daily. 09/27/19  Yes Paz, Alda Berthold, MD  tamsulosin (FLOMAX) 0.4 MG CAPS capsule Take 0.4 mg by mouth daily after supper.  05/01/16  Yes [provider]    Allergies    Pollen extract and Other  Review of Systems   Review of Systems  Constitutional: Negative.   HENT: Negative.   Respiratory: Negative.   Cardiovascular: Negative.   Gastrointestinal: Negative.   Genitourinary: Negative.   Musculoskeletal: Positive for back pain. Negative for arthralgias, gait problem, joint swelling, myalgias, neck pain and neck stiffness.  Skin: Negative.   Neurological: Positive for headaches. Negative for dizziness, tremors, seizures, syncope, facial asymmetry, speech difficulty, weakness, light-headedness and numbness.  All other systems reviewed and are negative.  Physical Exam Updated Vital Signs BP (!) 170/89   Pulse 88   Temp (!) 97.5 F (36.4 C) (Oral)   Resp (!) 21   SpO2 99%   Physical Exam Vitals and nursing note reviewed.  Constitutional:      General: He is not in acute distress.    Appearance: He is well-developed. He is not toxic-appearing or diaphoretic.     Comments: Elderly patient.  HENT:     Head: Normocephalic.     Jaw: There is normal jaw occlusion.      Comments: Laceration to posterior aspect of scalp.  No drooling, dysphagia or trismus    Mouth/Throat:     Lips: Pink.     Mouth: Mucous membranes are moist.     Pharynx: Oropharynx is clear. Uvula midline.     Comments: Tongue midline Eyes:     Extraocular Movements: Extraocular movements intact.     Conjunctiva/sclera: Conjunctivae normal.     Pupils: Pupils are equal, round, and reactive to light.     Comments: EOMs intact  Neck:     Trachea: Trachea and phonation normal.     Comments: C-collar in place Cardiovascular:     Rate and Rhythm: Normal  rate and regular rhythm.     Pulses: Normal pulses.          Radial pulses are 2+  on the right side and 2+ on the left side.       Dorsalis pedis pulses are 2+ on the right side and 2+ on the left side.       Posterior tibial pulses are 2+ on the right side and 2+ on the left side.     Heart sounds: Normal heart sounds.  Pulmonary:     Effort: Pulmonary effort is normal. No respiratory distress.     Breath sounds: Normal breath sounds and air entry.     Comments: Clear to auscultation bilaterally.  Speaks in full sentences without difficulty Chest:     Comments: Equal rise and fall of chest.  Tenderness to left posterior rib Abdominal:     General: Bowel sounds are normal. There is no distension.     Palpations: Abdomen is soft.     Tenderness: There is no abdominal tenderness.     Hernia: No hernia is present.     Comments: Soft, nontender.  Musculoskeletal:        General: Normal range of motion.     Comments: No shortening or rotation of legs.  Pelvis stable, nontender palpation.  Moderate lumbar tenderness L1-S1.  Moves bilateral upper and lower extremities without difficulty.  No bony tenderness to extremities  Feet:     Right foot:     Skin integrity: Skin integrity normal.     Left foot:     Skin integrity: Skin integrity normal.  Skin:    General: Skin is warm and dry.     Capillary Refill: Capillary refill takes less than 2 seconds.     Comments: Old skin tear to right olecranon, no surrounding erythema or warmth, 1 cm ulcer to left medial malleolus does not look actively infected  Neurological:     General: No focal deficit present.     Mental Status: He is alert. Mental status is at baseline.     Cranial Nerves: Cranial nerves are intact.     Sensory: Sensation is intact.     Motor: Motor function is intact.     Comments: Cranial nerves II to XII grossly intact, slow to speak however family states this is at baseline.  Unable to ambulate due to pain.     ED Results /  Procedures / Treatments   Labs (all labs ordered are listed, but only abnormal results are displayed) Labs Reviewed  CBC WITH DIFFERENTIAL/PLATELET - Abnormal; Notable for the following components:      Result Value   WBC 14.2 (*)    RBC 4.14 (*)    Neutro Abs 12.1 (*)    Abs Immature Granulocytes 0.21 (*)    All other components within normal limits  COMPREHENSIVE METABOLIC PANEL - Abnormal; Notable for the following components:   Glucose, Bld 106 (*)    Total Protein 6.3 (*)    Total Bilirubin 1.4 (*)    All other components within normal limits  URINALYSIS, ROUTINE W REFLEX MICROSCOPIC - Abnormal; Notable for the following components:   Specific Gravity, Urine 1.038 (*)    Hgb urine dipstick SMALL (*)    Ketones, ur 5 (*)    All other components within normal limits  I-STAT CHEM 8, ED - Abnormal; Notable for the following components:   Glucose, Bld 100 (*)    Hemoglobin 12.9 (*)    HCT 38.0 (*)    All other components within normal limits  SARS CORONAVIRUS 2 BY RT PCR (HOSPITAL ORDER, [place]  [facility])  LACTIC ACID, PLASMA  ETHANOL  CBC  PROTIME-INR  CBG MONITORING, ED  SAMPLE TO BLOOD BANK    EKG None  Radiology DG Chest 1 View  Result Date: 10/28/2019 CLINICAL DATA:  Status post fall. EXAM: CHEST  1 VIEW COMPARISON:  February 01, 2017 FINDINGS: There is no evidence of acute infiltrate, pleural effusion or pneumothorax. The heart size and mediastinal contours are within normal limits. There is moderate severity calcification of the aortic arch. An acute fracture of the seventh left rib is seen. IMPRESSION: Acute fracture of the seventh left rib. Electronically Signed   By: Virgina Norfolk M.D.   On: 10/28/2019 20:59   DG Pelvis 1-2 Views  Result Date: 10/28/2019 CLINICAL DATA:  Status post fall. EXAM: PELVIS - 1-2 VIEW COMPARISON:  None. FINDINGS: There is no evidence of pelvic fracture or diastasis. No pelvic bone lesions are seen. Mild  degenerative changes seen within the bilateral hips. IMPRESSION: No acute osseous abnormality. Electronically Signed   By: Virgina Norfolk M.D.   On: 10/28/2019 21:05   CT HEAD WO CONTRAST  Result Date: 10/28/2019 CLINICAL DATA:  [place] downstairs, posterior scalp laceration, Parkinson's, dementia EXAM: CT HEAD WITHOUT CONTRAST CT CERVICAL SPINE WITHOUT CONTRAST TECHNIQUE: Multidetector CT imaging of the head and cervical spine was performed following the standard protocol without intravenous contrast. Multiplanar CT image reconstructions of the cervical spine were also generated. COMPARISON:  07/18/2019 FINDINGS: CT HEAD FINDINGS Brain: Confluent areas of decreased attenuation throughout the periventricular white matter consistent with chronic small vessel ischemic changes, stable. No sign of acute infarct or hemorrhage. Lateral ventricles and remaining midline structures are unremarkable. No acute extra-axial fluid collections. No mass effect. Vascular: No hyperdense vessel or unexpected calcification. Skull: Normal. Negative for fracture or focal lesion. Sinuses/Orbits: Postsurgical changes bilateral maxillary sinuses. There is chronic mucosal thickening throughout the left maxillary sinus. Evidence of prior bilateral ethmoidectomies. Other: None. CT CERVICAL SPINE FINDINGS Alignment: Alignment is grossly anatomic. Skull base and vertebrae: No acute displaced fractures. Soft tissues and spinal canal: No prevertebral fluid or swelling. No visible canal hematoma. Disc levels: Mild multilevel spondylosis greatest at C4-5. There is diffuse facet hypertrophy. There is right predominant neural foraminal encroachment at the C4/C5 level. Upper chest: Airway is patent. Lung apices are clear. Other: Reconstructed images demonstrate no additional findings. IMPRESSION: 1. No acute intracranial process. Stable chronic small-vessel ischemic changes throughout the white matter. 2. No acute cervical spine fracture.  Electronically Signed   By: Randa Ngo M.D.   On: 10/28/2019 21:33   CT CHEST W CONTRAST  Result Date: 10/28/2019 CLINICAL DATA:  Acute pain due to trauma EXAM: CT CHEST, ABDOMEN, AND PELVIS WITH CONTRAST CT LUMBAR SPINE WITHOUT CONTRAST TECHNIQUE: Multidetector CT imaging of the chest, abdomen and pelvis was performed following the standard protocol during bolus administration of intravenous contrast. CONTRAST:  147mL OMNIPAQUE IOHEXOL 350 MG/ML SOLN COMPARISON:  CT L-spine dated November 14, 2009. CT of the pelvis dated November 15, 2013. FINDINGS: CT CHEST FINDINGS Cardiovascular: The heart size is normal. There are atherosclerotic changes of the thoracic aorta without evidence for dissection or aneurysm. There are coronary artery calcifications. There is no significant pericardial effusion. There is no large centrally located pulmonary embolism. Mediastinum/Nodes: -- No mediastinal lymphadenopathy. -- No hilar lymphadenopathy. -- No axillary lymphadenopathy. -- No supraclavicular lymphadenopathy. -- Normal thyroid gland where visualized. -  Unremarkable esophagus. Lungs/Pleura: There are trace bilateral pleural effusions. There is no pneumothorax. There is no focal infiltrate.  The trachea is unremarkable. Musculoskeletal: There is an acute minimally displaced fracture of the rudimentary twelfth rib on the right. There may be a nondisplaced buckle fracture of the twelfth rib on the left.There is a nondisplaced fracture of the lateral seventh rib. There are advanced degenerative changes of the bilateral glenohumeral joints. CT ABDOMEN PELVIS FINDINGS Hepatobiliary: There is a cyst in the left hepatic lobe. Cholelithiasis without acute inflammation.There is no biliary ductal dilation. Pancreas: Normal contours without ductal dilatation. No peripancreatic fluid collection. Spleen: Unremarkable. Adrenals/Urinary Tract: --Adrenal glands: Unremarkable. --Right kidney/ureter: No hydronephrosis or radiopaque kidney  stones. --Left kidney/ureter: There is a nonobstructing stone in the upper pole the left kidney. --Urinary bladder: Unremarkable. Stomach/Bowel: --Stomach/Duodenum: No hiatal hernia or other gastric abnormality. Normal duodenal course and caliber. --Small bowel: Unremarkable. --Colon: There is a large amount of stool in the rectum. --Appendix: Not visualized. No right lower quadrant inflammation or free fluid. Vascular/Lymphatic: Atherosclerotic calcification is present within the non-aneurysmal abdominal aorta, without hemodynamically significant stenosis. --No retroperitoneal lymphadenopathy. --No mesenteric lymphadenopathy. --No pelvic or inguinal lymphadenopathy. Reproductive: The prostate gland is enlarged. Other: No ascites or free air. The abdominal wall is normal. Musculoskeletal. There are acute mildly displaced fractures of the right L1 through L4 transverse processes. Degenerative changes are noted throughout the lumbar spine. 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 There is mild anterolisthesis of L5 on S1. There are degenerative changes throughout the visualized lumbar spine, greatest at the L1-L2 and L2-L3 levels. Facet arthrosis is noted. There is a probable nondisplaced fracture through the left sacral ala (axial series 4, image 71 on the lumbar reconstructions). IMPRESSION: 1. Acute mildly displaced fractures of the right L1 through L4 transverse processes. 2. There is a minimally displaced fracture of the posterior twelfth rib on the right. There is a nondisplaced fracture of the left lateral seventh rib. There is no pneumothorax. 3. Probable nondisplaced fracture through the left sacral ala. 4. Trace bilateral pleural effusions. 5. Cholelithiasis without acute inflammation. 6. Nonobstructive left nephrolithiasis. 7. Prostatomegaly. 8. Large amount of stool in the rectum. Aortic Atherosclerosis (ICD10-I70.0). Electronically Signed   By: Constance Holster M.D.   On: 10/28/2019 21:38    CT CERVICAL SPINE WO CONTRAST  Result Date: 10/28/2019 CLINICAL DATA:  [place] downstairs, posterior scalp laceration, Parkinson's, dementia EXAM: CT HEAD WITHOUT CONTRAST CT CERVICAL SPINE WITHOUT CONTRAST TECHNIQUE: Multidetector CT imaging of the head and cervical spine was performed following the standard protocol without intravenous contrast. Multiplanar CT image reconstructions of the cervical spine were also generated. COMPARISON:  07/18/2019 FINDINGS: CT HEAD FINDINGS Brain: Confluent areas of decreased attenuation throughout the periventricular white matter consistent with chronic small vessel ischemic changes, stable. No sign of acute infarct or hemorrhage. Lateral ventricles and remaining midline structures are unremarkable. No acute extra-axial fluid collections. No mass effect. Vascular: No hyperdense vessel or unexpected calcification. Skull: Normal. Negative for fracture or focal lesion. Sinuses/Orbits: Postsurgical changes bilateral maxillary sinuses. There is chronic mucosal thickening throughout the left maxillary sinus. Evidence of prior bilateral ethmoidectomies. Other: None. CT CERVICAL SPINE FINDINGS Alignment: Alignment is grossly anatomic. Skull base and vertebrae: No acute displaced fractures. Soft tissues and spinal canal: No prevertebral fluid or swelling. No visible canal hematoma. Disc levels: Mild multilevel spondylosis greatest at C4-5. There is diffuse facet hypertrophy. There is right predominant neural foraminal encroachment at the C4/C5 level. Upper chest: Airway is patent. Lung apices are clear. Other: Reconstructed images demonstrate no additional findings. IMPRESSION: 1. No acute intracranial  process. Stable chronic small-vessel ischemic changes throughout the white matter. 2. No acute cervical spine fracture. Electronically Signed   By: Randa Ngo M.D.   On: 10/28/2019 21:33   CT ABDOMEN PELVIS W CONTRAST  Result Date: 10/28/2019 CLINICAL DATA:  Acute pain due to  trauma EXAM: CT CHEST, ABDOMEN, AND PELVIS WITH CONTRAST CT LUMBAR SPINE WITHOUT CONTRAST TECHNIQUE: Multidetector CT imaging of the chest, abdomen and pelvis was performed following the standard protocol during bolus administration of intravenous contrast. CONTRAST:  152mL OMNIPAQUE IOHEXOL 350 MG/ML SOLN COMPARISON:  CT L-spine dated November 14, 2009. CT of the pelvis dated November 15, 2013. FINDINGS: CT CHEST FINDINGS Cardiovascular: The heart size is normal. There are atherosclerotic changes of the thoracic aorta without evidence for dissection or aneurysm. There are coronary artery calcifications. There is no significant pericardial effusion. There is no large centrally located pulmonary embolism. Mediastinum/Nodes: -- No mediastinal lymphadenopathy. -- No hilar lymphadenopathy. -- No axillary lymphadenopathy. -- No supraclavicular lymphadenopathy. -- Normal thyroid gland where visualized. -  Unremarkable esophagus. Lungs/Pleura: There are trace bilateral pleural effusions. There is no pneumothorax. There is no focal infiltrate. The trachea is unremarkable. Musculoskeletal: There is an acute minimally displaced fracture of the rudimentary twelfth rib on the right. There may be a nondisplaced buckle fracture of the twelfth rib on the left.There is a nondisplaced fracture of the lateral seventh rib. There are advanced degenerative changes of the bilateral glenohumeral joints. CT ABDOMEN PELVIS FINDINGS Hepatobiliary: There is a cyst in the left hepatic lobe. Cholelithiasis without acute inflammation.There is no biliary ductal dilation. Pancreas: Normal contours without ductal dilatation. No peripancreatic fluid collection. Spleen: Unremarkable. Adrenals/Urinary Tract: --Adrenal glands: Unremarkable. --Right kidney/ureter: No hydronephrosis or radiopaque kidney stones. --Left kidney/ureter: There is a nonobstructing stone in the upper pole the left kidney. --Urinary bladder: Unremarkable. Stomach/Bowel:  --Stomach/Duodenum: No hiatal hernia or other gastric abnormality. Normal duodenal course and caliber. --Small bowel: Unremarkable. --Colon: There is a large amount of stool in the rectum. --Appendix: Not visualized. No right lower quadrant inflammation or free fluid. Vascular/Lymphatic: Atherosclerotic calcification is present within the non-aneurysmal abdominal aorta, without hemodynamically significant stenosis. --No retroperitoneal lymphadenopathy. --No mesenteric lymphadenopathy. --No pelvic or inguinal lymphadenopathy. Reproductive: The prostate gland is enlarged. Other: No ascites or free air. The abdominal wall is normal. Musculoskeletal. There are acute mildly displaced fractures of the right L1 through L4 transverse processes. Degenerative changes are noted throughout the lumbar spine. 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 There is mild anterolisthesis of L5 on S1. There are degenerative changes throughout the visualized lumbar spine, greatest at the L1-L2 and L2-L3 levels. Facet arthrosis is noted. There is a probable nondisplaced fracture through the left sacral ala (axial series 4, image 71 on the lumbar reconstructions). IMPRESSION: 1. Acute mildly displaced fractures of the right L1 through L4 transverse processes. 2. There is a minimally displaced fracture of the posterior twelfth rib on the right. There is a nondisplaced fracture of the left lateral seventh rib. There is no pneumothorax. 3. Probable nondisplaced fracture through the left sacral ala. 4. Trace bilateral pleural effusions. 5. Cholelithiasis without acute inflammation. 6. Nonobstructive left nephrolithiasis. 7. Prostatomegaly. 8. Large amount of stool in the rectum. Aortic Atherosclerosis (ICD10-I70.0). Electronically Signed   By: Constance Holster M.D.   On: 10/28/2019 21:38   CT L-SPINE NO CHARGE  Result Date: 10/28/2019 CLINICAL DATA:  Acute pain due to trauma EXAM: CT CHEST, ABDOMEN, AND PELVIS WITH CONTRAST  CT LUMBAR SPINE  WITHOUT CONTRAST TECHNIQUE: Multidetector CT imaging of the chest, abdomen and pelvis was performed following the standard protocol during bolus administration of intravenous contrast. CONTRAST:  171mL OMNIPAQUE IOHEXOL 350 MG/ML SOLN COMPARISON:  CT L-spine dated November 14, 2009. CT of the pelvis dated November 15, 2013. FINDINGS: CT CHEST FINDINGS Cardiovascular: The heart size is normal. There are atherosclerotic changes of the thoracic aorta without evidence for dissection or aneurysm. There are coronary artery calcifications. There is no significant pericardial effusion. There is no large centrally located pulmonary embolism. Mediastinum/Nodes: -- No mediastinal lymphadenopathy. -- No hilar lymphadenopathy. -- No axillary lymphadenopathy. -- No supraclavicular lymphadenopathy. -- Normal thyroid gland where visualized. -  Unremarkable esophagus. Lungs/Pleura: There are trace bilateral pleural effusions. There is no pneumothorax. There is no focal infiltrate. The trachea is unremarkable. Musculoskeletal: There is an acute minimally displaced fracture of the rudimentary twelfth rib on the right. There may be a nondisplaced buckle fracture of the twelfth rib on the left.There is a nondisplaced fracture of the lateral seventh rib. There are advanced degenerative changes of the bilateral glenohumeral joints. CT ABDOMEN PELVIS FINDINGS Hepatobiliary: There is a cyst in the left hepatic lobe. Cholelithiasis without acute inflammation.There is no biliary ductal dilation. Pancreas: Normal contours without ductal dilatation. No peripancreatic fluid collection. Spleen: Unremarkable. Adrenals/Urinary Tract: --Adrenal glands: Unremarkable. --Right kidney/ureter: No hydronephrosis or radiopaque kidney stones. --Left kidney/ureter: There is a nonobstructing stone in the upper pole the left kidney. --Urinary bladder: Unremarkable. Stomach/Bowel: --Stomach/Duodenum: No hiatal hernia or other gastric abnormality. Normal  duodenal course and caliber. --Small bowel: Unremarkable. --Colon: There is a large amount of stool in the rectum. --Appendix: Not visualized. No right lower quadrant inflammation or free fluid. Vascular/Lymphatic: Atherosclerotic calcification is present within the non-aneurysmal abdominal aorta, without hemodynamically significant stenosis. --No retroperitoneal lymphadenopathy. --No mesenteric lymphadenopathy. --No pelvic or inguinal lymphadenopathy. Reproductive: The prostate gland is enlarged. Other: No ascites or free air. The abdominal wall is normal. Musculoskeletal. There are acute mildly displaced fractures of the right L1 through L4 transverse processes. Degenerative changes are noted throughout the lumbar spine. 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 There is mild anterolisthesis of L5 on S1. There are degenerative changes throughout the visualized lumbar spine, greatest at the L1-L2 and L2-L3 levels. Facet arthrosis is noted. There is a probable nondisplaced fracture through the left sacral ala (axial series 4, image 71 on the lumbar reconstructions). IMPRESSION: 1. Acute mildly displaced fractures of the right L1 through L4 transverse processes. 2. There is a minimally displaced fracture of the posterior twelfth rib on the right. There is a nondisplaced fracture of the left lateral seventh rib. There is no pneumothorax. 3. Probable nondisplaced fracture through the left sacral ala. 4. Trace bilateral pleural effusions. 5. Cholelithiasis without acute inflammation. 6. Nonobstructive left nephrolithiasis. 7. Prostatomegaly. 8. Large amount of stool in the rectum. Aortic Atherosclerosis (ICD10-I70.0). Electronically Signed   By: Constance Holster M.D.   On: 10/28/2019 21:38   DG Knee Complete 4 Views Left  Result Date: 10/28/2019 CLINICAL DATA:  Status post fall. EXAM: LEFT KNEE - COMPLETE 4+ VIEW COMPARISON:  None. FINDINGS: No evidence of acute fracture or dislocation. Mild medial  and lateral tibiofemoral compartment space narrowing is seen. Moderate to marked severity vascular calcification is noted. A very small joint effusion is present. IMPRESSION: 1. No evidence of acute fracture or dislocation. 2. Very small joint effusion. Electronically Signed   By: Virgina Norfolk M.D.   On: 10/28/2019 21:04  DG FEMUR MIN 2 VIEWS LEFT  Result Date: 10/28/2019 CLINICAL DATA:  Status post fall. EXAM: LEFT FEMUR 2 VIEWS COMPARISON:  None. FINDINGS: There is no evidence of fracture or other focal bone lesions. Moderate severity vascular calcification is seen. IMPRESSION: No acute osseous abnormality. Electronically Signed   By: Virgina Norfolk M.D.   On: 10/28/2019 21:01   DG FEMUR, MIN 2 VIEWS RIGHT  Result Date: 10/28/2019 CLINICAL DATA:  Status post fall. EXAM: RIGHT FEMUR 2 VIEWS COMPARISON:  None. FINDINGS: There is no evidence of fracture or other focal bone lesions. There is moderate to marked severity vascular calcification. IMPRESSION: 1. No acute osseous abnormality. Electronically Signed   By: Virgina Norfolk M.D.   On: 10/28/2019 21:02   Procedures .Marland KitchenLaceration Repair  Date/Time: 10/28/2019 10:50 PM Performed by: Nettie Elm, PA-C Authorized by: Nettie Elm, PA-C   Consent:    Consent obtained:  Verbal   Consent given by:  Patient and healthcare agent   Risks discussed:  Infection, need for additional repair, pain, poor cosmetic result and poor wound healing   Alternatives discussed:  No treatment and delayed treatment Universal protocol:    Procedure explained and questions answered to patient or proxy's satisfaction: yes     Relevant documents present and verified: yes     Test results available and properly labeled: yes     Imaging studies available: yes     Required blood products, implants, devices, and special equipment available: yes     Site/side marked: yes     Immediately prior to procedure, a time out was called: yes     Patient  identity confirmed:  Verbally with patient and arm band Anesthesia (see MAR for exact dosages):    Anesthesia method:  Topical application   Topical anesthetic:  LET Laceration details:    Location:  Scalp   Scalp location:  Crown   Length (cm):  1 Repair type:    Repair type:  Simple Pre-procedure details:    Preparation:  Patient was prepped and draped in usual sterile fashion and imaging obtained to evaluate for foreign bodies Exploration:    Hemostasis achieved with:  Direct pressure   Wound exploration: wound explored through full range of motion and entire depth of wound probed and visualized     Contaminated: no   Treatment:    Area cleansed with:  Betadine   Amount of cleaning:  Extensive   Irrigation solution:  Sterile saline   Irrigation method:  Pressure wash Skin repair:    Repair method:  Staples   Number of staples:  1 Approximation:    Approximation:  Close Post-procedure details:    Dressing:  Open (no dressing)   Patient tolerance of procedure:  Tolerated well, no immediate complications   (including critical care time)  Medications Ordered in ED Medications  sodium chloride 0.9 % bolus 500 mL (0 mLs Intravenous Stopped 10/28/19 2315)  fentaNYL (SUBLIMAZE) injection 50 mcg (50 mcg Intravenous Given 10/28/19 2021)  iohexol (OMNIPAQUE) 350 MG/ML injection 100 mL (100 mLs Intravenous Contrast Given 10/28/19 2102)  lidocaine-EPINEPHrine-tetracaine (LET) topical gel (3 mLs Topical Given 10/28/19 2234)  fentaNYL (SUBLIMAZE) injection 50 mcg (50 mcg Intravenous Given 10/28/19 2230)  carbidopa-levodopa (SINEMET CR) 50-200 MG per tablet controlled release 1 tablet (1 tablet Oral Given 10/28/19 2311)   ED Course  I have reviewed the triage vital signs and the nursing notes.  Pertinent labs & imaging results that were available during my care of  the patient were reviewed by me and considered in my medical decision making (see chart for details).  60 male with mechanical  fall filled on approximately 11-12 steps.  Patient with lower back pain, left-sided rib pain.  Does have laceration to posterior scalp.  He is at his baseline mentation per family.  No bony tenderness to extremities.  Protecting airway.  Abdomen soft, nontender.  Equal rise and fall of chest without evidence of flail chest.  Neurovascularly intact.  Plan on labs, imaging and reassess.  He is not anticoagulated.  Imaging personally viewed and interpreted.  He does require additional dose of pain medicine does not able to ambulate due to pain.  Plan on consulting with trauma for admission as well as neurosurgery given his lumbar transverse process fractures.  Labs with leukocytosis at 14.2 however no infectious symptoms Lactic acid 1.1 Metabolic panel with mild hyperglycemia, mildly elevated T bili however abdomen soft EKG without STEMI  CT imaging with:  IMPRESSION:  1. Acute mildly displaced fractures of the right L1 through L4  transverse processes.  2. There is a minimally displaced fracture of the posterior twelfth  rib on the right. There is a nondisplaced fracture of the left  lateral seventh rib. There is no pneumothorax.  3. Probable nondisplaced fracture through the left sacral ala.  4. Trace bilateral pleural effusions.  5. Cholelithiasis without acute inflammation.  6. Nonobstructive left nephrolithiasis.  7. Prostatomegaly.  8. Large amount of stool in the rectum.    Clinical Course as of Oct 28 2343  Sat Oct 28, 2019  2130 No acute fracture  DG Pelvis 1-2 Views [BH]  2131 No fracture or dislocation, small joint effusion  DG Knee Complete 4 Views Left [BH]  2131 No fracture or dislocation  DG FEMUR, MIN 2 VIEWS RIGHT [BH]  2131 No fracture or dislocation  DG FEMUR MIN 2 VIEWS LEFT [BH]  2131 Left seventh rib fracture  DG Chest 1 View [BH]  2236 No acute intracranial process  CT HEAD WO CONTRAST [BH]  2236 No acute cervical fracture  CT CERVICAL SPINE WO CONTRAST  [BH]    Clinical Course User Index [BH] Austen Wygant A, PA-C   Patient to be admitted for observation, continued pain management, likely PT given pain and inability to walk due to pain.  CONSULT with Dr. Dema Severin with Trauma service, recommends Medicine admit Trauma will follow  CONSULT with Neurosurgery Dr. Vertell Limber who recommends TLSO brace and will follow. Not unstable fracture, just pain management.  CONSULT with Dr. Daryll Drown with [facility] who will evaluate patient for admission.  The patient appears reasonably stabilized for admission considering the current resources, flow, and capabilities available in the ED at this time, and I doubt any other [facility] requiring further screening and/or treatment in the ED prior to admission.  I discussed patient with attending, Dr. Rogene Houston who agrees with the treatment, plan and disposition  MDM Rules/Calculators/A&P                           Final Clinical Impression(s) / ED Diagnoses Final diagnoses:  Fall  Closed fracture of multiple ribs of left side, initial encounter  Closed fracture of transverse process of lumbar vertebra, initial encounter ([place])  Closed fracture of sacrum, unspecified portion of sacrum, initial encounter ([place])  Laceration of scalp, initial encounter    Rx / DC Orders ED Discharge Orders    None  Davon Folta A, PA-C 10/28/19 2345    Fredia Sorrow, MD 11/02/19 0041

## 2019-10-28 NOTE — ED Notes (Signed)
Condom cath has been placed on the patient. Gauze and coban applied on the right posterior side of arm and left posterior side of lower leg.

## 2019-10-28 NOTE — H&P (Signed)
History and Physical    [name] HER:[id number] DOB: 12/03/1933 DOA: 10/28/2019  PCP: Colon Branch, MD Patient coming from: Home  Chief Complaint: fall  HPI: [name] is a 86 y.o. male with medical history significant of Parkinson's Disease, overactive bladder, osteoarthritis, depression, HTN, HLD, GERD, anxiety, BPH, h/o bladder cancer, DDD who presents after a fall.  [name] has been having a decline in function in the last few months with unsteadiness on his feet due to parkinson's disease as well as worsening dementia.  I spoke with his wife, [name], who reports increasing frustration from him about his need to depend on people to help him with walking and remembering tasks.  Today, his family thought he was in the restroom, however, he did take it upon himself to walk up the stairs.  He lost his footing and fell down about 12 steps.  His wife and family heard the fall. During the fall, he was apparently conscious and speaking to himself per his wife.  He is unsure if he lost consciousness, but feels that he just slipped.  He does have a history of recurrent syncope and has been seeing cardiology for this.  I reviewed the last note from June and his work up has included a TTE (08/16/19  - mild stenosis of the mitral valve, repeat in 1 year. Normal EF, indeterminate diastolic parameters), 30 day monitor (no events) and the plan was to proceed with a loop recorder.  The visit to EP has not occurred as of yet.    ED Course: In the ED, he was found to have acute mildly displaced fractures of the right L1-L4 transverse processes and a back brace was placed.  Further found to have minimally displaced fracture of the twelfth rib on the right and a non displaced fracture of the seventh lateral rib.  He has trace effusions bilaterally and probable non displaced fracture through the left sacral ala.  Also note to have gallstones, prostatomegaly and large amount of stool in the rectum.  Neurosurgery was  consulted and will follow along.  Gen Surg was consulted and recommended a pain regimen and will follow.  Xrays of the hips and knees showed no fracture. Blood work showed a leukocytosis with a neutrophilic predominance.  UA showed a small Hgb, no sign of infection.   Review of Systems: As per HPI otherwise all other systems reviewed and are negative.   Past Medical History:  Diagnosis Date  . Allergic rhinitis   . Anxiety   . Bladder cancer [facility])    urologist-  dr eskridge--  low grade TA  . BPH with elevated PSA   . Complication of anesthesia    "paranoid after back surgery in october lasted 2 to 3 days"  . DDD (degenerative disc disease), lumbar   . Depression   . Dry mouth    USES BIOTIN SPRAY/ MOUTHWASH  . Frequency of urination   . GERD (gastroesophageal reflux disease)   . Hearing loss    does not wear his hearing aid  . History of kidney stones    10/1989  . Hyperlipidemia   . Hypertension   . Mild obstructive sleep apnea    PER PT  MILD OSA , NO CPAP RX,  STUDY DONE 2009  . Nocturia   . OAB (overactive bladder)   . Osteoarthritis    KNEES, SHOULDER  . Parkinson disease ([place]) DX   2005   NEUROLOGIST-   DR TAT--  IDIOPATHIC PARKINSON'S /  TREMORS CONTROLLED WITH MEDS  . Urge urinary incontinence     Past Surgical History:  Procedure Laterality Date  . CATARACT EXTRACTION W/ INTRAOCULAR LENS  IMPLANT, BILATERAL  2014  . CYSTOSCOPY Bilateral 04/09/2017   Procedure: CYSTOSCOPY/ RETROGRADE;  Surgeon: Festus Aloe, MD;  Location: [facility];  Service: Urology;  Laterality: Bilateral;  . CYSTOSCOPY W/ RETROGRADES Bilateral 12/14/2014   Procedure: CYSTOSCOPY BLADDER BIOPSY FULGERATION MITOMYCIN C BILATERAL RETROGRADE PYELOGRAM;  Surgeon: Festus Aloe, MD;  Location: [facility];  Service: Urology;  Laterality: Bilateral;  . CYSTOSCOPY WITH STENT PLACEMENT Left 12/19/2013   Procedure: CYSTOSCOPY BLADDER BX, LEFT URETERAL STENT PLACEMENT , LEFT RETROGRADE  AND PYLOGRAM;  Surgeon: Festus Aloe, MD;  Location: [facility];  Service: Urology;  Laterality: Left;  . EYE SURGERY Bilateral    ioc for cataract  . HERNIA REPAIR  2013   inguinal  . INGUINAL HERNIA REPAIR  03/09/2012   Procedure: HERNIA REPAIR INGUINAL ADULT;  Surgeon: Adin Hector, MD;  Location: [facility];  Service: General;  Laterality: Right;  . INSERTION OF MESH  03/09/2012   Procedure: INSERTION OF MESH;  Surgeon: Adin Hector, MD;  Location: [facility];  Service: General;  Laterality: Right;  . LUMBAR LAMINECTOMY/DECOMPRESSION MICRODISCECTOMY N/A 02/05/2017   Procedure: Laminectomy and Foraminotomy - Lumbar One-Two,Lumbar Two-Three, Lumbar Three-Four.;  Surgeon: Eustace Moore, MD;  Location: [facility];  Service: Neurosurgery;  Laterality: N/A;  . NASAL SINUS SURGERY  1982  . ORIF RIGHT ARM FX  1949   HARDWARE REMOVED   . REMOVAL BENIGN CYST LEFT FOREHEAD  1969  . TRANSURETHRAL RESECTION OF BLADDER TUMOR N/A 12/19/2013   Procedure:  TRANSURETHRAL RESECTION OF BLADDER TUMOR WITH GYRUS (TURBT-GYRUS);  Surgeon: Festus Aloe, MD;  Location[facility];  Service: Urology;  Laterality: N/A;  . TRANSURETHRAL RESECTION OF BLADDER TUMOR N/A 04/09/2017   Procedure: TRANSURETHRAL RESECTION OF BLADDER TUMOR / (TURBT) 2-5cm;  Surgeon: Festus Aloe, MD;  Location: [facility];  Service: Urology;  Laterality: N/A;  ONLY NEEDS 90 MIN FOR BOTH PROCEDURES    Social History  reports that he quit smoking about 51 years ago. His smoking use included cigarettes. He has a 15.00 pack-year smoking history. He has never used smokeless tobacco. He reports current alcohol use. He reports that he does not use drugs.  Allergies  Allergen Reactions  . Pollen Extract Other (See Comments)  . Other Other (See Comments)    Paranoia from anesthesia drug last visit-unknown drug     Family History  Problem Relation Age of Onset  . Alzheimer's disease Mother   . Liver cancer  Father   . Cancer Father        colon  . Healthy Daughter   . Diabetes Son   . Prostate cancer Neg Hx   . CAD Neg Hx      Prior to Admission medications   Medication Sig Start Date End Date Taking? Authorizing Provider  ALPRAZolam (XANAX) 0.25 MG tablet Take 1 tablet (0.25 mg total) by mouth 3 (three) times daily as needed for anxiety. 09/27/19  Yes Paz, Alda Berthold, MD  bacitracin ointment Apply 1 application topically 2 (two) times daily. 07/19/19  Yes Varney Biles, MD  Ca Carbonate-Mag Hydroxide (ROLAIDS PO) Take 2 tablets by mouth as needed (indigestion).    Yes [provider]  carbidopa-levodopa (SINEMET CR) 50-200 MG tablet TAKE 1 TABLET BY MOUTH EVERYDAY AT BEDTIME Patient taking differently:  Take 1 tablet by mouth at bedtime.  06/30/19  Yes Tat, Eustace Quail, DO  carbidopa-levodopa (SINEMET IR) 25-100 MG tablet TAKE TWO TABLETS BY MOUTH FOUR TIMES DAILY Patient taking differently: Take 2 tablets by mouth 4 (four) times daily.  09/27/19  Yes Tat, Eustace Quail, DO  docusate sodium (COLACE) 50 MG capsule Take 50-100 mg by mouth See admin instructions. Take 100 mg in the morning and 50 mg at 4pm   Yes [provider]  fluticasone (FLONASE) 50 MCG/ACT nasal spray Place 2 sprays into both nostrils daily as needed for allergies or rhinitis. 12/29/18  Yes Paz, Alda Berthold, MD  ibuprofen (ADVIL,MOTRIN) 200 MG tablet Take 400 mg by mouth every 8 (eight) hours as needed for fever or mild pain.    Yes [provider]  Multiple Vitamin (MULTIVITAMIN WITH MINERALS) TABS tablet Take 1 tablet by mouth daily.   Yes [provider]  MYRBETRIQ 25 MG TB24 tablet Take 50 mg by mouth daily at 4 PM.  02/24/16  Yes [provider]  polyethylene glycol (MIRALAX / GLYCOLAX) packet Take 17 g by mouth daily as needed for mild constipation.    Yes [provider]  Polyvinyl Alcohol-Povidone (REFRESH OP) Place 1-2 drops into both eyes daily as needed (dry eyes).   Yes [provider]  sertraline (ZOLOFT) 100 MG tablet Take 1.5 tablets (150 mg total) by mouth daily. 09/27/19  Yes Paz, Alda Berthold, MD  tamsulosin (FLOMAX) 0.4 MG CAPS capsule Take 0.4 mg by mouth daily after supper.  05/01/16  Yes [provider]    Physical Exam: Vitals:   10/28/19 2015 10/28/19 2132 10/28/19 2300 10/28/19 2315  BP: (!) 164/75 (!) 180/87 (!) 172/90 (!) 170/89  Pulse: 74 85 86 88  Resp: 19 17 20  (!) 21  Temp:      TempSrc:      SpO2: 98% 99% 99% 99%    Constitutional: NAD, calm, comfortable, lying in bed, confused concerning surroundings Eyes:  lids and conjunctivae normal, EOMI ENMT: Mucous membranes are moist.  Neck: normal, supple Respiratory: Breathing comfortably, CTAB, no wheezing noted.  Cardiovascular: RR, NR, no murmur noted, Pulses intact in radial and DP arteries, no peripheral edema.  Abdomen: mild tenderness over the right abdomen with some voluntary guarding, soft, non distended, +BS Musculoskeletal: no clubbing / cyanosis. No contractures.  + resting tremor Skin: no rashes, lesions, ulcers on exposed skin Neurologic: CN 2-12 grossly intact. Sensation intact to light touch.  He has good strength in the arms and legs, however, he would not complete formal strength testing due to confusion.  Psychiatric: Alert and oriented X 2, confused at times, frustrated with back brace.    Labs on Admission: I have personally reviewed following labs and imaging studies  CBC: Recent Labs  Lab 10/28/19 1945 10/28/19 2037  WBC 14.2*  --   NEUTROABS 12.1*  --   HGB 13.1 12.9*  HCT 40.3 38.0*  MCV 97.3  --   PLT 209  --     Basic Metabolic Panel: Recent Labs  Lab 10/28/19 1945 10/28/19 2037  NA 141 141  K 4.3 3.7  CL 106 102  CO2 28  --   GLUCOSE 106* 100*  BUN 19 20  CREATININE 0.99 0.90  CALCIUM 9.1  --     GFR: Estimated Creatinine Clearance: 53.3 mL/min (by C-G formula based on SCr of 0.9 mg/dL).  Liver Function Tests: Recent Labs  Lab  10/28/19 1945  AST  17  ALT 6  ALKPHOS 94  BILITOT 1.4*  PROT 6.3*  ALBUMIN 3.9    Urine analysis:    Component Value Date/Time   COLORURINE YELLOW 10/28/2019 2318   APPEARANCEUR CLEAR 10/28/2019 2318   LABSPEC 1.038 (H) 10/28/2019 2318   PHURINE 5.0 10/28/2019 2318   GLUCOSEU NEGATIVE 10/28/2019 2318   GLUCOSEU NEGATIVE 08/23/2019 1017   HGBUR SMALL (A) 10/28/2019 2318   HGBUR negative 05/22/2009 0911   BILIRUBINUR NEGATIVE 10/28/2019 2318   BILIRUBINUR neg 03/18/2011 0903   KETONESUR 5 (A) 10/28/2019 2318   PROTEINUR NEGATIVE 10/28/2019 2318   UROBILINOGEN 0.2 08/23/2019 1017   NITRITE NEGATIVE 10/28/2019 2318   LEUKOCYTESUR NEGATIVE 10/28/2019 2318    Radiological Exams on Admission: DG Chest 1 View  Result Date: 10/28/2019 CLINICAL DATA:  Status post fall. EXAM: CHEST  1 VIEW COMPARISON:  February 01, 2017 FINDINGS: There is no evidence of acute infiltrate, pleural effusion or pneumothorax. The heart size and mediastinal contours are within normal limits. There is moderate severity calcification of the aortic arch. An acute fracture of the seventh left rib is seen. IMPRESSION: Acute fracture of the seventh left rib. Electronically Signed   By: Virgina Norfolk M.D.   On: 10/28/2019 20:59   DG Pelvis 1-2 Views  Result Date: 10/28/2019 CLINICAL DATA:  Status post fall. EXAM: PELVIS - 1-2 VIEW COMPARISON:  None. FINDINGS: There is no evidence of pelvic fracture or diastasis. No pelvic bone lesions are seen. Mild degenerative changes seen within the bilateral hips. IMPRESSION: No acute osseous abnormality. Electronically Signed   By: Virgina Norfolk M.D.   On: 10/28/2019 21:05   CT HEAD WO CONTRAST  Result Date: 10/28/2019 CLINICAL DATA:  [place] [address] downstairs, posterior scalp laceration, Parkinson's, dementia EXAM: CT HEAD WITHOUT CONTRAST CT CERVICAL SPINE WITHOUT CONTRAST TECHNIQUE: Multidetector CT imaging of the head and cervical spine was performed following the standard  protocol without intravenous contrast. Multiplanar CT image reconstructions of the cervical spine were also generated. COMPARISON:  07/18/2019 FINDINGS: CT HEAD FINDINGS Brain: Confluent areas of decreased attenuation throughout the periventricular white matter consistent with chronic small vessel ischemic changes, stable. No sign of acute infarct or hemorrhage. Lateral ventricles and remaining midline structures are unremarkable. No acute extra-axial fluid collections. No mass effect. Vascular: No hyperdense vessel or unexpected calcification. Skull: Normal. Negative for fracture or focal lesion. Sinuses/Orbits: Postsurgical changes bilateral maxillary sinuses. There is chronic mucosal thickening throughout the left maxillary sinus. Evidence of prior bilateral ethmoidectomies. Other: None. CT CERVICAL SPINE FINDINGS Alignment: Alignment is grossly anatomic. Skull base and vertebrae: No acute displaced fractures. Soft tissues and spinal canal: No prevertebral fluid or swelling. No visible canal hematoma. Disc levels: Mild multilevel spondylosis greatest at C4-5. There is diffuse facet hypertrophy. There is right predominant neural foraminal encroachment at the C4/C5 level. Upper chest: Airway is patent. Lung apices are clear. Other: Reconstructed images demonstrate no additional findings. IMPRESSION: 1. No acute intracranial process. Stable chronic small-vessel ischemic changes throughout the white matter. 2. No acute cervical spine fracture. Electronically Signed   By: Randa Ngo M.D.   On: 10/28/2019 21:33   CT CHEST W CONTRAST  Result Date: 10/28/2019 CLINICAL DATA:  Acute pain due to trauma EXAM: CT CHEST, ABDOMEN, AND PELVIS WITH CONTRAST CT LUMBAR SPINE WITHOUT CONTRAST TECHNIQUE: Multidetector CT imaging of the chest, abdomen and pelvis was performed following the standard protocol during bolus administration of intravenous contrast. CONTRAST:  15mL OMNIPAQUE IOHEXOL 350 MG/ML SOLN COMPARISON:  CT  L-spine dated November 14, 2009. CT of the pelvis dated November 15, 2013. FINDINGS: CT CHEST FINDINGS Cardiovascular: The heart size is normal. There are atherosclerotic changes of the thoracic aorta without evidence for dissection or aneurysm. There are coronary artery calcifications. There is no significant pericardial effusion. There is no large centrally located pulmonary embolism. Mediastinum/Nodes: -- No mediastinal lymphadenopathy. -- No hilar lymphadenopathy. -- No axillary lymphadenopathy. -- No supraclavicular lymphadenopathy. -- Normal thyroid gland where visualized. -  Unremarkable esophagus. Lungs/Pleura: There are trace bilateral pleural effusions. There is no pneumothorax. There is no focal infiltrate. The trachea is unremarkable. Musculoskeletal: There is an acute minimally displaced fracture of the rudimentary twelfth rib on the right. There may be a nondisplaced buckle fracture of the twelfth rib on the left.There is a nondisplaced fracture of the lateral seventh rib. There are advanced degenerative changes of the bilateral glenohumeral joints. CT ABDOMEN PELVIS FINDINGS Hepatobiliary: There is a cyst in the left hepatic lobe. Cholelithiasis without acute inflammation.There is no biliary ductal dilation. Pancreas: Normal contours without ductal dilatation. No peripancreatic fluid collection. Spleen: Unremarkable. Adrenals/Urinary Tract: --Adrenal glands: Unremarkable. --Right kidney/ureter: No hydronephrosis or radiopaque kidney stones. --Left kidney/ureter: There is a nonobstructing stone in the upper pole the left kidney. --Urinary bladder: Unremarkable. Stomach/Bowel: --Stomach/Duodenum: No hiatal hernia or other gastric abnormality. Normal duodenal course and caliber. --Small bowel: Unremarkable. --Colon: There is a large amount of stool in the rectum. --Appendix: Not visualized. No right lower quadrant inflammation or free fluid. Vascular/Lymphatic: Atherosclerotic calcification is present within the  non-aneurysmal abdominal aorta, without hemodynamically significant stenosis. --No retroperitoneal lymphadenopathy. --No mesenteric lymphadenopathy. --No pelvic or inguinal lymphadenopathy. Reproductive: The prostate gland is enlarged. Other: No ascites or free air. The abdominal wall is normal. Musculoskeletal. There are acute mildly displaced fractures of the right L1 through L4 transverse processes. Degenerative changes are noted throughout the lumbar spine. 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 There is mild anterolisthesis of L5 on S1. There are degenerative changes throughout the visualized lumbar spine, greatest at the L1-L2 and L2-L3 levels. Facet arthrosis is noted. There is a probable nondisplaced fracture through the left sacral ala (axial series 4, image 71 on the lumbar reconstructions). IMPRESSION: 1. Acute mildly displaced fractures of the right L1 through L4 transverse processes. 2. There is a minimally displaced fracture of the posterior twelfth rib on the right. There is a nondisplaced fracture of the left lateral seventh rib. There is no pneumothorax. 3. Probable nondisplaced fracture through the left sacral ala. 4. Trace bilateral pleural effusions. 5. Cholelithiasis without acute inflammation. 6. Nonobstructive left nephrolithiasis. 7. Prostatomegaly. 8. Large amount of stool in the rectum. Aortic Atherosclerosis (ICD10-I70.0). Electronically Signed   By: Constance Holster M.D.   On: 10/28/2019 21:38   CT CERVICAL SPINE WO CONTRAST  Result Date: 10/28/2019 CLINICAL DATA:  [place] [address] downstairs, posterior scalp laceration, Parkinson's, dementia EXAM: CT HEAD WITHOUT CONTRAST CT CERVICAL SPINE WITHOUT CONTRAST TECHNIQUE: Multidetector CT imaging of the head and cervical spine was performed following the standard protocol without intravenous contrast. Multiplanar CT image reconstructions of the cervical spine were also generated. COMPARISON:  07/18/2019 FINDINGS: CT HEAD FINDINGS  Brain: Confluent areas of decreased attenuation throughout the periventricular white matter consistent with chronic small vessel ischemic changes, stable. No sign of acute infarct or hemorrhage. Lateral ventricles and remaining midline structures are unremarkable. No acute extra-axial fluid collections. No mass effect. Vascular: No hyperdense vessel or unexpected calcification. Skull: Normal. Negative for fracture or focal lesion.  Sinuses/Orbits: Postsurgical changes bilateral maxillary sinuses. There is chronic mucosal thickening throughout the left maxillary sinus. Evidence of prior bilateral ethmoidectomies. Other: None. CT CERVICAL SPINE FINDINGS Alignment: Alignment is grossly anatomic. Skull base and vertebrae: No acute displaced fractures. Soft tissues and spinal canal: No prevertebral fluid or swelling. No visible canal hematoma. Disc levels: Mild multilevel spondylosis greatest at C4-5. There is diffuse facet hypertrophy. There is right predominant neural foraminal encroachment at the C4/C5 level. Upper chest: Airway is patent. Lung apices are clear. Other: Reconstructed images demonstrate no additional findings. IMPRESSION: 1. No acute intracranial process. Stable chronic small-vessel ischemic changes throughout the white matter. 2. No acute cervical spine fracture. Electronically Signed   By: Randa Ngo M.D.   On: 10/28/2019 21:33   CT ABDOMEN PELVIS W CONTRAST  Result Date: 10/28/2019 CLINICAL DATA:  Acute pain due to trauma EXAM: CT CHEST, ABDOMEN, AND PELVIS WITH CONTRAST CT LUMBAR SPINE WITHOUT CONTRAST TECHNIQUE: Multidetector CT imaging of the chest, abdomen and pelvis was performed following the standard protocol during bolus administration of intravenous contrast. CONTRAST:  166mL OMNIPAQUE IOHEXOL 350 MG/ML SOLN COMPARISON:  CT L-spine dated November 14, 2009. CT of the pelvis dated November 15, 2013. FINDINGS: CT CHEST FINDINGS Cardiovascular: The heart size is normal. There are atherosclerotic  changes of the thoracic aorta without evidence for dissection or aneurysm. There are coronary artery calcifications. There is no significant pericardial effusion. There is no large centrally located pulmonary embolism. Mediastinum/Nodes: -- No mediastinal lymphadenopathy. -- No hilar lymphadenopathy. -- No axillary lymphadenopathy. -- No supraclavicular lymphadenopathy. -- Normal thyroid gland where visualized. -  Unremarkable esophagus. Lungs/Pleura: There are trace bilateral pleural effusions. There is no pneumothorax. There is no focal infiltrate. The trachea is unremarkable. Musculoskeletal: There is an acute minimally displaced fracture of the rudimentary twelfth rib on the right. There may be a nondisplaced buckle fracture of the twelfth rib on the left.There is a nondisplaced fracture of the lateral seventh rib. There are advanced degenerative changes of the bilateral glenohumeral joints. CT ABDOMEN PELVIS FINDINGS Hepatobiliary: There is a cyst in the left hepatic lobe. Cholelithiasis without acute inflammation.There is no biliary ductal dilation. Pancreas: Normal contours without ductal dilatation. No peripancreatic fluid collection. Spleen: Unremarkable. Adrenals/Urinary Tract: --Adrenal glands: Unremarkable. --Right kidney/ureter: No hydronephrosis or radiopaque kidney stones. --Left kidney/ureter: There is a nonobstructing stone in the upper pole the left kidney. --Urinary bladder: Unremarkable. Stomach/Bowel: --Stomach/Duodenum: No hiatal hernia or other gastric abnormality. Normal duodenal course and caliber. --Small bowel: Unremarkable. --Colon: There is a large amount of stool in the rectum. --Appendix: Not visualized. No right lower quadrant inflammation or free fluid. Vascular/Lymphatic: Atherosclerotic calcification is present within the non-aneurysmal abdominal aorta, without hemodynamically significant stenosis. --No retroperitoneal lymphadenopathy. --No mesenteric lymphadenopathy. --No pelvic  or inguinal lymphadenopathy. Reproductive: The prostate gland is enlarged. Other: No ascites or free air. The abdominal wall is normal. Musculoskeletal. There are acute mildly displaced fractures of the right L1 through L4 transverse processes. Degenerative changes are noted throughout the lumbar spine. 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 There is mild anterolisthesis of L5 on S1. There are degenerative changes throughout the visualized lumbar spine, greatest at the L1-L2 and L2-L3 levels. Facet arthrosis is noted. There is a probable nondisplaced fracture through the left sacral ala (axial series 4, image 71 on the lumbar reconstructions). IMPRESSION: 1. Acute mildly displaced fractures of the right L1 through L4 transverse processes. 2. There is a minimally displaced fracture of the posterior twelfth  rib on the right. There is a nondisplaced fracture of the left lateral seventh rib. There is no pneumothorax. 3. Probable nondisplaced fracture through the left sacral ala. 4. Trace bilateral pleural effusions. 5. Cholelithiasis without acute inflammation. 6. Nonobstructive left nephrolithiasis. 7. Prostatomegaly. 8. Large amount of stool in the rectum. Aortic Atherosclerosis (ICD10-I70.0). Electronically Signed   By: Constance Holster M.D.   On: 10/28/2019 21:38   CT L-SPINE NO CHARGE  Result Date: 10/28/2019 CLINICAL DATA:  Acute pain due to trauma EXAM: CT CHEST, ABDOMEN, AND PELVIS WITH CONTRAST CT LUMBAR SPINE WITHOUT CONTRAST TECHNIQUE: Multidetector CT imaging of the chest, abdomen and pelvis was performed following the standard protocol during bolus administration of intravenous contrast. CONTRAST:  191mL OMNIPAQUE IOHEXOL 350 MG/ML SOLN COMPARISON:  CT L-spine dated November 14, 2009. CT of the pelvis dated November 15, 2013. FINDINGS: CT CHEST FINDINGS Cardiovascular: The heart size is normal. There are atherosclerotic changes of the thoracic aorta without evidence for dissection or  aneurysm. There are coronary artery calcifications. There is no significant pericardial effusion. There is no large centrally located pulmonary embolism. Mediastinum/Nodes: -- No mediastinal lymphadenopathy. -- No hilar lymphadenopathy. -- No axillary lymphadenopathy. -- No supraclavicular lymphadenopathy. -- Normal thyroid gland where visualized. -  Unremarkable esophagus. Lungs/Pleura: There are trace bilateral pleural effusions. There is no pneumothorax. There is no focal infiltrate. The trachea is unremarkable. Musculoskeletal: There is an acute minimally displaced fracture of the rudimentary twelfth rib on the right. There may be a nondisplaced buckle fracture of the twelfth rib on the left.There is a nondisplaced fracture of the lateral seventh rib. There are advanced degenerative changes of the bilateral glenohumeral joints. CT ABDOMEN PELVIS FINDINGS Hepatobiliary: There is a cyst in the left hepatic lobe. Cholelithiasis without acute inflammation.There is no biliary ductal dilation. Pancreas: Normal contours without ductal dilatation. No peripancreatic fluid collection. Spleen: Unremarkable. Adrenals/Urinary Tract: --Adrenal glands: Unremarkable. --Right kidney/ureter: No hydronephrosis or radiopaque kidney stones. --Left kidney/ureter: There is a nonobstructing stone in the upper pole the left kidney. --Urinary bladder: Unremarkable. Stomach/Bowel: --Stomach/Duodenum: No hiatal hernia or other gastric abnormality. Normal duodenal course and caliber. --Small bowel: Unremarkable. --Colon: There is a large amount of stool in the rectum. --Appendix: Not visualized. No right lower quadrant inflammation or free fluid. Vascular/Lymphatic: Atherosclerotic calcification is present within the non-aneurysmal abdominal aorta, without hemodynamically significant stenosis. --No retroperitoneal lymphadenopathy. --No mesenteric lymphadenopathy. --No pelvic or inguinal lymphadenopathy. Reproductive: The prostate gland is  enlarged. Other: No ascites or free air. The abdominal wall is normal. Musculoskeletal. There are acute mildly displaced fractures of the right L1 through L4 transverse processes. Degenerative changes are noted throughout the lumbar spine. 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 There is mild anterolisthesis of L5 on S1. There are degenerative changes throughout the visualized lumbar spine, greatest at the L1-L2 and L2-L3 levels. Facet arthrosis is noted. There is a probable nondisplaced fracture through the left sacral ala (axial series 4, image 71 on the lumbar reconstructions). IMPRESSION: 1. Acute mildly displaced fractures of the right L1 through L4 transverse processes. 2. There is a minimally displaced fracture of the posterior twelfth rib on the right. There is a nondisplaced fracture of the left lateral seventh rib. There is no pneumothorax. 3. Probable nondisplaced fracture through the left sacral ala. 4. Trace bilateral pleural effusions. 5. Cholelithiasis without acute inflammation. 6. Nonobstructive left nephrolithiasis. 7. Prostatomegaly. 8. Large amount of stool in the rectum. Aortic Atherosclerosis (ICD10-I70.0). Electronically Signed   By: Harrell Gave  Green M.D.   On: 10/28/2019 21:38   DG Knee Complete 4 Views Left  Result Date: 10/28/2019 CLINICAL DATA:  Status post fall. EXAM: LEFT KNEE - COMPLETE 4+ VIEW COMPARISON:  None. FINDINGS: No evidence of acute fracture or dislocation. Mild medial and lateral tibiofemoral compartment space narrowing is seen. Moderate to marked severity vascular calcification is noted. A very small joint effusion is present. IMPRESSION: 1. No evidence of acute fracture or dislocation. 2. Very small joint effusion. Electronically Signed   By: Virgina Norfolk M.D.   On: 10/28/2019 21:04   DG FEMUR MIN 2 VIEWS LEFT  Result Date: 10/28/2019 CLINICAL DATA:  Status post fall. EXAM: LEFT FEMUR 2 VIEWS COMPARISON:  None. FINDINGS: There is no evidence  of fracture or other focal bone lesions. Moderate severity vascular calcification is seen. IMPRESSION: No acute osseous abnormality. Electronically Signed   By: Virgina Norfolk M.D.   On: 10/28/2019 21:01   DG FEMUR, MIN 2 VIEWS RIGHT  Result Date: 10/28/2019 CLINICAL DATA:  Status post fall. EXAM: RIGHT FEMUR 2 VIEWS COMPARISON:  None. FINDINGS: There is no evidence of fracture or other focal bone lesions. There is moderate to marked severity vascular calcification. IMPRESSION: 1. No acute osseous abnormality. Electronically Signed   By: Virgina Norfolk M.D.   On: 10/28/2019 21:02    EKG: Independently reviewed. NSR, paired PACs  Assessment/Plan Fall PD (Parkinson's disease) L1-L4 fractures Rib fractures Likely pelvic fracture - Discussed with patient and wife, appears that fall was mechanical.  Due to multiple changes, decreased ability to walk on own, patient has been very frustrated and apparently attempted to climb the stairs by himself.  Patient's wife heard him speaking while falling and feels that he did not lose consciousness - Gen Surg and NSG are consulted - Plan for back brace, pain control - Consider PT when cleared by surgery - Continue sinemet at home dose  Recurrent Syncope  - Being worked up by Cardiology, reviewed last note and next step would be loop recorder - Monitor on telemetry for arrhythmia   Dementia due to Parkinson's disease without behavioral disturbance - Frequent redirection - Delirium precautions  Depression - Continue sertraline, xanax at home doses    Essential hypertension - Not currently on medication, monitor    Osteoarthritis - Pain regimen as per Gen Surgery    BPH (benign prostatic hyperplasia) OAB (overactive bladder)  - Continue myrbetriq and external catheter  - Continue tamsulosin   Leukocytosis - Likely reactive, UA and CXR without signs of infection - Monitor  DVT prophylaxis: Lovenox  Code Status:   DNR  Family  Communication:  [name] Nestler, had discussion concerning code status and patient's wishes for prolonged care, noted that based on his recent status, would think he would wish for DNR at this time.  Disposition Plan:   Patient is from:  Home  Anticipated DC to:  SNF vs. Home with [facility]  Anticipated DC date:  11/01/19  Anticipated DC barriers: Pain control, ambulation  Consults called:  Dr. Vertell Limber, NSG; Dr. Dema Severin, Gen Surg Admission status:  Telemetry, IP   Severity of Illness: The appropriate patient status for this patient is INPATIENT. Inpatient status is judged to be reasonable and necessary in order to provide the required intensity of service to ensure the patient's safety. The patient's presenting symptoms, physical exam findings, and initial radiographic and laboratory data in the context of their chronic comorbidities is felt to place them at high risk for further clinical deterioration. Furthermore,  it is not anticipated that the patient will be medically stable for discharge from the hospital within 2 midnights of admission. The following factors support the patient status of inpatient.   " The patient's presenting symptoms include Multiple fractures, pain. " The worrisome physical exam findings include Pain. " The initial radiographic and laboratory data are worrisome because of Fractures. " The chronic co-morbidities include syncope, PD.   * I certify that at the point of admission it is my clinical judgment that the patient will require inpatient hospital care spanning beyond 2 midnights from the point of admission due to high intensity of service, high risk for further deterioration and high frequency of surveillance required.Gilles Chiquito MD [facility]  How to contact the [facility] Attending or Consulting provider [place] or covering provider during after hours [place], for this patient?   1. Check the care team in [facility] and look for a) attending/consulting [facility] provider listed  and b) the [facility] team listed 2. Log into [email or web address] and use 's universal password to access. If you do not have the password, please contact the hospital operator. 3. Locate the [facility] provider you are looking for under [facility] and page to a number that you can be directly reached. 4. If you still have difficulty reaching the provider, please page the [facility] (Director on Call) for the Hospitalists listed on amion for assistance.  10/29/2019, 2:31 AM

## 2019-10-28 NOTE — Consult Note (Signed)
CC: Fall  Requesting provider: Shelby Dubin PA-C  HPI: [name] is an 86 y.o. male with hx of Parkinson's dementia, HTN, HLD, frequent falls whom fell down 11-12 steps eariler this evening. Known hx of recurrent syncope as well. He arrived complaining of back pain which he has at baseline but has worsened - lower back. Also with some discomfort to inspiration in chest wall. Denies any pain in his head, neck, abdomen, upper extremities, lower extremities. His daughter is present at bedside as due to dementia, history is a bit limited.  Past Medical History:  Diagnosis Date  . Allergic rhinitis   . Anxiety   . Bladder cancer [facility])    urologist-  dr eskridge--  low grade TA  . BPH with elevated PSA   . Complication of anesthesia    "paranoid after back surgery in october lasted 2 to 3 days"  . DDD (degenerative disc disease), lumbar   . Depression   . Dry mouth    USES BIOTIN SPRAY/ MOUTHWASH  . Frequency of urination   . GERD (gastroesophageal reflux disease)   . Hearing loss    does not wear his hearing aid  . History of kidney stones    10/1989  . Hyperlipidemia   . Hypertension   . Mild obstructive sleep apnea    PER PT  MILD OSA , NO CPAP RX,  STUDY DONE 2009  . Nocturia   . OAB (overactive bladder)   . Osteoarthritis    KNEES, SHOULDER  . Parkinson disease ([place]) DX   2005   NEUROLOGIST-   DR TAT--  IDIOPATHIC PARKINSON'S /  TREMORS CONTROLLED WITH MEDS  . Urge urinary incontinence     Past Surgical History:  Procedure Laterality Date  . CATARACT EXTRACTION W/ INTRAOCULAR LENS  IMPLANT, BILATERAL  2014  . CYSTOSCOPY Bilateral 04/09/2017   Procedure: CYSTOSCOPY/ RETROGRADE;  Surgeon: Festus Aloe, MD;  Location: [facility];  Service: Urology;  Laterality: Bilateral;  . CYSTOSCOPY W/ RETROGRADES Bilateral 12/14/2014   Procedure: CYSTOSCOPY BLADDER BIOPSY FULGERATION MITOMYCIN C BILATERAL RETROGRADE PYELOGRAM;  Surgeon: Festus Aloe, MD;  Location: [facility];  Service: Urology;  Laterality: Bilateral;  . CYSTOSCOPY WITH STENT PLACEMENT Left 12/19/2013   Procedure: CYSTOSCOPY BLADDER BX, LEFT URETERAL STENT PLACEMENT , LEFT RETROGRADE AND PYLOGRAM;  Surgeon: Festus Aloe, MD;  Location: [facility];  Service: Urology;  Laterality: Left;  . EYE SURGERY Bilateral    ioc for cataract  . HERNIA REPAIR  2013   inguinal  . INGUINAL HERNIA REPAIR  03/09/2012   Procedure: HERNIA REPAIR INGUINAL ADULT;  Surgeon: Adin Hector, MD;  Location: [facility];  Service: General;  Laterality: Right;  . INSERTION OF MESH  03/09/2012   Procedure: INSERTION OF MESH;  Surgeon: Adin Hector, MD;  Location: [facility];  Service: General;  Laterality: Right;  . LUMBAR LAMINECTOMY/DECOMPRESSION MICRODISCECTOMY N/A 02/05/2017   Procedure: Laminectomy and Foraminotomy - Lumbar One-Two,Lumbar Two-Three, Lumbar Three-Four.;  Surgeon: Eustace Moore, MD;  Location: [name];  Service: Neurosurgery;  Laterality: N/A;  . NASAL SINUS SURGERY  1982  . ORIF RIGHT ARM FX  1949   HARDWARE REMOVED   . REMOVAL BENIGN CYST LEFT FOREHEAD  1969  . TRANSURETHRAL RESECTION OF BLADDER TUMOR N/A 12/19/2013   Procedure:  TRANSURETHRAL RESECTION OF BLADDER TUMOR WITH GYRUS (TURBT-GYRUS);  Surgeon: Festus Aloe, MD;  [facility];  Service: Urology;  Laterality: N/A;  . TRANSURETHRAL RESECTION  OF BLADDER TUMOR N/A 04/09/2017   Procedure: TRANSURETHRAL RESECTION OF BLADDER TUMOR / (TURBT) 2-5cm;  Surgeon: Festus Aloe, MD;  Location: [facility];  Service: Urology;  Laterality: N/A;  ONLY NEEDS 90 MIN FOR BOTH PROCEDURES    Family History  Problem Relation Age of Onset  . Alzheimer's disease Mother   . Liver cancer Father   . Cancer Father        colon  . Healthy Daughter   . Diabetes Son   . Prostate cancer Neg Hx   . CAD Neg Hx     Social:  reports that he quit smoking about 51 years ago. His smoking use included cigarettes. He  has a 15.00 pack-year smoking history. He has never used smokeless tobacco. He reports current alcohol use. He reports that he does not use drugs.  Allergies:  Allergies  Allergen Reactions  . Pollen Extract Other (See Comments)  . Other Other (See Comments)    Paranoia from anesthesia drug last visit-unknown drug     Medications: I have reviewed the patient's current medications.  Results for orders placed or performed during the hospital encounter of 10/28/19 (from the past 48 hour(s))  CBC with Differential     Status: Abnormal   Collection Time: 10/28/19  7:45 PM  Result Value Ref Range   WBC 14.2 (H) 4.0 - 10.5 K/uL   RBC 4.14 (L) 4.22 - 5.81 MIL/uL   Hemoglobin 13.1 13.0 - 17.0 g/dL   HCT 40.3 39 - 52 %   MCV 97.3 80.0 - 100.0 fL   MCH 31.6 26.0 - 34.0 pg   MCHC 32.5 30.0 - 36.0 g/dL   RDW 13.2 11.5 - 15.5 %   Platelets 209 150 - 400 K/uL   nRBC 0.0 0.0 - 0.2 %   Neutrophils Relative % 84 %   Neutro Abs 12.1 (H) 1.7 - 7.7 K/uL   Lymphocytes Relative 7 %   Lymphs Abs 0.9 0.7 - 4.0 K/uL   Monocytes Relative 5 %   Monocytes Absolute 0.8 0 - 1 K/uL   Eosinophils Relative 1 %   Eosinophils Absolute 0.2 0 - 0 K/uL   Basophils Relative 1 %   Basophils Absolute 0.1 0 - 0 K/uL   Immature Granulocytes 2 %   Abs Immature Granulocytes 0.21 (H) 0.00 - 0.07 K/uL    Comment: Performed at [facility], [address] [place], [place] [address]  Comprehensive metabolic panel     Status: Abnormal   Collection Time: 10/28/19  7:45 PM  Result Value Ref Range   Sodium 141 135 - 145 mmol/L   Potassium 4.3 3.5 - 5.1 mmol/L   Chloride 106 98 - 111 mmol/L   CO2 28 22 - 32 mmol/L   Glucose, Bld 106 (H) 70 - 99 mg/dL    Comment: Glucose reference range applies only to samples taken after fasting for at least 8 hours.   BUN 19 8 - 23 mg/dL   Creatinine, Ser 0.99 0.61 - 1.24 mg/dL   Calcium 9.1 8.9 - 10.3 mg/dL   Total Protein 6.3 (L) 6.5 - 8.1 g/dL   Albumin 3.9 3.5 - 5.0 g/dL    AST 17 15 - 41 U/L   ALT 6 0 - 44 U/L   Alkaline Phosphatase 94 38 - 126 U/L   Total Bilirubin 1.4 (H) 0.3 - 1.2 mg/dL   GFR calc non Af Amer >60 >60 mL/min   GFR calc Af Amer >60 >60  mL/min   Anion gap 7 5 - 15    Comment: Performed at [facility] [address], [place], [place] [address]  Lactic acid, plasma     Status: None   Collection Time: 10/28/19  8:28 PM  Result Value Ref Range   Lactic Acid, Venous 1.1 0.5 - 1.9 mmol/L    Comment: Performed at [facility] [address] [place], [place] [address]  Ethanol     Status: None   Collection Time: 10/28/19  8:28 PM  Result Value Ref Range   Alcohol, Ethyl (B) <10 <10 mg/dL    Comment: (NOTE) Lowest detectable limit for serum alcohol is 10 mg/dL.  For medical purposes only. Performed at [facility], [place] [address], [place], [place] [address]   Sample to Blood Bank     Status: None   Collection Time: 10/28/19  8:30 PM  Result Value Ref Range   Blood Bank Specimen SAMPLE AVAILABLE FOR TESTING    Sample Expiration      10/29/2019,2359 Performed at [facility], [place] [address] [place], [place] [address]   I-Stat Chem 8, ED     Status: Abnormal   Collection Time: 10/28/19  8:37 PM  Result Value Ref Range   Sodium 141 135 - 145 mmol/L   Potassium 3.7 3.5 - 5.1 mmol/L   Chloride 102 98 - 111 mmol/L   BUN 20 8 - 23 mg/dL   Creatinine, Ser 0.90 0.61 - 1.24 mg/dL   Glucose, Bld 100 (H) 70 - 99 mg/dL    Comment: Glucose reference range applies only to samples taken after fasting for at least 8 hours.   Calcium, Ion 1.20 1.15 - 1.40 mmol/L   TCO2 25 22 - 32 mmol/L   Hemoglobin 12.9 (L) 13.0 - 17.0 g/dL   HCT 38.0 (L) 39 - 52 %  POC CBG, ED     Status: None   Collection Time: 10/28/19  9:42 PM  Result Value Ref Range   Glucose-Capillary 81 70 - 99 mg/dL    Comment: Glucose reference range applies only to samples taken after fasting for at least 8 hours.    DG Chest 1 View  Result Date:  10/28/2019 CLINICAL DATA:  Status post fall. EXAM: CHEST  1 VIEW COMPARISON:  February 01, 2017 FINDINGS: There is no evidence of acute infiltrate, pleural effusion or pneumothorax. The heart size and mediastinal contours are within normal limits. There is moderate severity calcification of the aortic arch. An acute fracture of the seventh left rib is seen. IMPRESSION: Acute fracture of the seventh left rib. Electronically Signed   By: Virgina Norfolk M.D.   On: 10/28/2019 20:59   DG Pelvis 1-2 Views  Result Date: 10/28/2019 CLINICAL DATA:  Status post fall. EXAM: PELVIS - 1-2 VIEW COMPARISON:  None. FINDINGS: There is no evidence of pelvic fracture or diastasis. No pelvic bone lesions are seen. Mild degenerative changes seen within the bilateral hips. IMPRESSION: No acute osseous abnormality. Electronically Signed   By: Virgina Norfolk M.D.   On: 10/28/2019 21:05   CT HEAD WO CONTRAST  Result Date: 10/28/2019 CLINICAL DATA:  [address] downstairs, posterior scalp laceration, Parkinson's, dementia EXAM: CT HEAD WITHOUT CONTRAST CT CERVICAL SPINE WITHOUT CONTRAST TECHNIQUE: Multidetector CT imaging of the head and cervical spine was performed following the standard protocol without intravenous contrast. Multiplanar CT image reconstructions of the cervical spine were also generated. COMPARISON:  07/18/2019 FINDINGS: CT HEAD FINDINGS  Brain: Confluent areas of decreased attenuation throughout the periventricular Starlin Steib matter consistent with chronic small vessel ischemic changes, stable. No sign of acute infarct or hemorrhage. Lateral ventricles and remaining midline structures are unremarkable. No acute extra-axial fluid collections. No mass effect. Vascular: No hyperdense vessel or unexpected calcification. Skull: Normal. Negative for fracture or focal lesion. Sinuses/Orbits: Postsurgical changes bilateral maxillary sinuses. There is chronic mucosal thickening throughout the left maxillary sinus. Evidence of prior  bilateral ethmoidectomies. Other: None. CT CERVICAL SPINE FINDINGS Alignment: Alignment is grossly anatomic. Skull base and vertebrae: No acute displaced fractures. Soft tissues and spinal canal: No prevertebral fluid or swelling. No visible canal hematoma. Disc levels: Mild multilevel spondylosis greatest at C4-5. There is diffuse facet hypertrophy. There is right predominant neural foraminal encroachment at the C4/C5 level. Upper chest: Airway is patent. Lung apices are clear. Other: Reconstructed images demonstrate no additional findings. IMPRESSION: 1. No acute intracranial process. Stable chronic small-vessel ischemic changes throughout the Jamyron Redd matter. 2. No acute cervical spine fracture. Electronically Signed   By: Randa Ngo M.D.   On: 10/28/2019 21:33   CT CHEST W CONTRAST  Result Date: 10/28/2019 CLINICAL DATA:  Acute pain due to trauma EXAM: CT CHEST, ABDOMEN, AND PELVIS WITH CONTRAST CT LUMBAR SPINE WITHOUT CONTRAST TECHNIQUE: Multidetector CT imaging of the chest, abdomen and pelvis was performed following the standard protocol during bolus administration of intravenous contrast. CONTRAST:  135mL OMNIPAQUE IOHEXOL 350 MG/ML SOLN COMPARISON:  CT L-spine dated November 14, 2009. CT of the pelvis dated November 15, 2013. FINDINGS: CT CHEST FINDINGS Cardiovascular: The heart size is normal. There are atherosclerotic changes of the thoracic aorta without evidence for dissection or aneurysm. There are coronary artery calcifications. There is no significant pericardial effusion. There is no large centrally located pulmonary embolism. Mediastinum/Nodes: -- No mediastinal lymphadenopathy. -- No hilar lymphadenopathy. -- No axillary lymphadenopathy. -- No supraclavicular lymphadenopathy. -- Normal thyroid gland where visualized. -  Unremarkable esophagus. Lungs/Pleura: There are trace bilateral pleural effusions. There is no pneumothorax. There is no focal infiltrate. The trachea is unremarkable.  Musculoskeletal: There is an acute minimally displaced fracture of the rudimentary twelfth rib on the right. There may be a nondisplaced buckle fracture of the twelfth rib on the left.There is a nondisplaced fracture of the lateral seventh rib. There are advanced degenerative changes of the bilateral glenohumeral joints. CT ABDOMEN PELVIS FINDINGS Hepatobiliary: There is a cyst in the left hepatic lobe. Cholelithiasis without acute inflammation.There is no biliary ductal dilation. Pancreas: Normal contours without ductal dilatation. No peripancreatic fluid collection. Spleen: Unremarkable. Adrenals/Urinary Tract: --Adrenal glands: Unremarkable. --Right kidney/ureter: No hydronephrosis or radiopaque kidney stones. --Left kidney/ureter: There is a nonobstructing stone in the upper pole the left kidney. --Urinary bladder: Unremarkable. Stomach/Bowel: --Stomach/Duodenum: No hiatal hernia or other gastric abnormality. Normal duodenal course and caliber. --Small bowel: Unremarkable. --Colon: There is a large amount of stool in the rectum. --Appendix: Not visualized. No right lower quadrant inflammation or free fluid. Vascular/Lymphatic: Atherosclerotic calcification is present within the non-aneurysmal abdominal aorta, without hemodynamically significant stenosis. --No retroperitoneal lymphadenopathy. --No mesenteric lymphadenopathy. --No pelvic or inguinal lymphadenopathy. Reproductive: The prostate gland is enlarged. Other: No ascites or free air. The abdominal wall is normal. Musculoskeletal. There are acute mildly displaced fractures of the right L1 through L4 transverse processes. Degenerative changes are noted throughout the lumbar spine. 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 There is mild anterolisthesis of L5 on S1. There are degenerative changes throughout the visualized lumbar spine,  greatest at the L1-L2 and L2-L3 levels. Facet arthrosis is noted. There is a probable nondisplaced fracture  through the left sacral ala (axial series 4, image 71 on the lumbar reconstructions). IMPRESSION: 1. Acute mildly displaced fractures of the right L1 through L4 transverse processes. 2. There is a minimally displaced fracture of the posterior twelfth rib on the right. There is a nondisplaced fracture of the left lateral seventh rib. There is no pneumothorax. 3. Probable nondisplaced fracture through the left sacral ala. 4. Trace bilateral pleural effusions. 5. Cholelithiasis without acute inflammation. 6. Nonobstructive left nephrolithiasis. 7. Prostatomegaly. 8. Large amount of stool in the rectum. Aortic Atherosclerosis (ICD10-I70.0). Electronically Signed   By: Constance Holster M.D.   On: 10/28/2019 21:38   CT CERVICAL SPINE WO CONTRAST  Result Date: 10/28/2019 CLINICAL DATA:  [address] downstairs, posterior scalp laceration, Parkinson's, dementia EXAM: CT HEAD WITHOUT CONTRAST CT CERVICAL SPINE WITHOUT CONTRAST TECHNIQUE: Multidetector CT imaging of the head and cervical spine was performed following the standard protocol without intravenous contrast. Multiplanar CT image reconstructions of the cervical spine were also generated. COMPARISON:  07/18/2019 FINDINGS: CT HEAD FINDINGS Brain: Confluent areas of decreased attenuation throughout the periventricular Samanthamarie Ezzell matter consistent with chronic small vessel ischemic changes, stable. No sign of acute infarct or hemorrhage. Lateral ventricles and remaining midline structures are unremarkable. No acute extra-axial fluid collections. No mass effect. Vascular: No hyperdense vessel or unexpected calcification. Skull: Normal. Negative for fracture or focal lesion. Sinuses/Orbits: Postsurgical changes bilateral maxillary sinuses. There is chronic mucosal thickening throughout the left maxillary sinus. Evidence of prior bilateral ethmoidectomies. Other: None. CT CERVICAL SPINE FINDINGS Alignment: Alignment is grossly anatomic. Skull base and vertebrae: No acute displaced  fractures. Soft tissues and spinal canal: No prevertebral fluid or swelling. No visible canal hematoma. Disc levels: Mild multilevel spondylosis greatest at C4-5. There is diffuse facet hypertrophy. There is right predominant neural foraminal encroachment at the C4/C5 level. Upper chest: Airway is patent. Lung apices are clear. Other: Reconstructed images demonstrate no additional findings. IMPRESSION: 1. No acute intracranial process. Stable chronic small-vessel ischemic changes throughout the Milferd Ansell matter. 2. No acute cervical spine fracture. Electronically Signed   By: Randa Ngo M.D.   On: 10/28/2019 21:33   CT ABDOMEN PELVIS W CONTRAST  Result Date: 10/28/2019 CLINICAL DATA:  Acute pain due to trauma EXAM: CT CHEST, ABDOMEN, AND PELVIS WITH CONTRAST CT LUMBAR SPINE WITHOUT CONTRAST TECHNIQUE: Multidetector CT imaging of the chest, abdomen and pelvis was performed following the standard protocol during bolus administration of intravenous contrast. CONTRAST:  152mL OMNIPAQUE IOHEXOL 350 MG/ML SOLN COMPARISON:  CT L-spine dated November 14, 2009. CT of the pelvis dated November 15, 2013. FINDINGS: CT CHEST FINDINGS Cardiovascular: The heart size is normal. There are atherosclerotic changes of the thoracic aorta without evidence for dissection or aneurysm. There are coronary artery calcifications. There is no significant pericardial effusion. There is no large centrally located pulmonary embolism. Mediastinum/Nodes: -- No mediastinal lymphadenopathy. -- No hilar lymphadenopathy. -- No axillary lymphadenopathy. -- No supraclavicular lymphadenopathy. -- Normal thyroid gland where visualized. -  Unremarkable esophagus. Lungs/Pleura: There are trace bilateral pleural effusions. There is no pneumothorax. There is no focal infiltrate. The trachea is unremarkable. Musculoskeletal: There is an acute minimally displaced fracture of the rudimentary twelfth rib on the right. There may be a nondisplaced buckle fracture of the  twelfth rib on the left.There is a nondisplaced fracture of the lateral seventh rib. There are advanced degenerative changes of the bilateral glenohumeral joints. CT  ABDOMEN PELVIS FINDINGS Hepatobiliary: There is a cyst in the left hepatic lobe. Cholelithiasis without acute inflammation.There is no biliary ductal dilation. Pancreas: Normal contours without ductal dilatation. No peripancreatic fluid collection. Spleen: Unremarkable. Adrenals/Urinary Tract: --Adrenal glands: Unremarkable. --Right kidney/ureter: No hydronephrosis or radiopaque kidney stones. --Left kidney/ureter: There is a nonobstructing stone in the upper pole the left kidney. --Urinary bladder: Unremarkable. Stomach/Bowel: --Stomach/Duodenum: No hiatal hernia or other gastric abnormality. Normal duodenal course and caliber. --Small bowel: Unremarkable. --Colon: There is a large amount of stool in the rectum. --Appendix: Not visualized. No right lower quadrant inflammation or free fluid. Vascular/Lymphatic: Atherosclerotic calcification is present within the non-aneurysmal abdominal aorta, without hemodynamically significant stenosis. --No retroperitoneal lymphadenopathy. --No mesenteric lymphadenopathy. --No pelvic or inguinal lymphadenopathy. Reproductive: The prostate gland is enlarged. Other: No ascites or free air. The abdominal wall is normal. Musculoskeletal. There are acute mildly displaced fractures of the right L1 through L4 transverse processes. Degenerative changes are noted throughout the lumbar spine. 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 There is mild anterolisthesis of L5 on S1. There are degenerative changes throughout the visualized lumbar spine, greatest at the L1-L2 and L2-L3 levels. Facet arthrosis is noted. There is a probable nondisplaced fracture through the left sacral ala (axial series 4, image 71 on the lumbar reconstructions). IMPRESSION: 1. Acute mildly displaced fractures of the right L1 through L4  transverse processes. 2. There is a minimally displaced fracture of the posterior twelfth rib on the right. There is a nondisplaced fracture of the left lateral seventh rib. There is no pneumothorax. 3. Probable nondisplaced fracture through the left sacral ala. 4. Trace bilateral pleural effusions. 5. Cholelithiasis without acute inflammation. 6. Nonobstructive left nephrolithiasis. 7. Prostatomegaly. 8. Large amount of stool in the rectum. Aortic Atherosclerosis (ICD10-I70.0). Electronically Signed   By: Constance Holster M.D.   On: 10/28/2019 21:38   CT L-SPINE NO CHARGE  Result Date: 10/28/2019 CLINICAL DATA:  Acute pain due to trauma EXAM: CT CHEST, ABDOMEN, AND PELVIS WITH CONTRAST CT LUMBAR SPINE WITHOUT CONTRAST TECHNIQUE: Multidetector CT imaging of the chest, abdomen and pelvis was performed following the standard protocol during bolus administration of intravenous contrast. CONTRAST:  193mL OMNIPAQUE IOHEXOL 350 MG/ML SOLN COMPARISON:  CT L-spine dated November 14, 2009. CT of the pelvis dated November 15, 2013. FINDINGS: CT CHEST FINDINGS Cardiovascular: The heart size is normal. There are atherosclerotic changes of the thoracic aorta without evidence for dissection or aneurysm. There are coronary artery calcifications. There is no significant pericardial effusion. There is no large centrally located pulmonary embolism. Mediastinum/Nodes: -- No mediastinal lymphadenopathy. -- No hilar lymphadenopathy. -- No axillary lymphadenopathy. -- No supraclavicular lymphadenopathy. -- Normal thyroid gland where visualized. -  Unremarkable esophagus. Lungs/Pleura: There are trace bilateral pleural effusions. There is no pneumothorax. There is no focal infiltrate. The trachea is unremarkable. Musculoskeletal: There is an acute minimally displaced fracture of the rudimentary twelfth rib on the right. There may be a nondisplaced buckle fracture of the twelfth rib on the left.There is a nondisplaced fracture of the lateral  seventh rib. There are advanced degenerative changes of the bilateral glenohumeral joints. CT ABDOMEN PELVIS FINDINGS Hepatobiliary: There is a cyst in the left hepatic lobe. Cholelithiasis without acute inflammation.There is no biliary ductal dilation. Pancreas: Normal contours without ductal dilatation. No peripancreatic fluid collection. Spleen: Unremarkable. Adrenals/Urinary Tract: --Adrenal glands: Unremarkable. --Right kidney/ureter: No hydronephrosis or radiopaque kidney stones. --Left kidney/ureter: There is a nonobstructing stone in the upper pole the left kidney. --  Urinary bladder: Unremarkable. Stomach/Bowel: --Stomach/Duodenum: No hiatal hernia or other gastric abnormality. Normal duodenal course and caliber. --Small bowel: Unremarkable. --Colon: There is a large amount of stool in the rectum. --Appendix: Not visualized. No right lower quadrant inflammation or free fluid. Vascular/Lymphatic: Atherosclerotic calcification is present within the non-aneurysmal abdominal aorta, without hemodynamically significant stenosis. --No retroperitoneal lymphadenopathy. --No mesenteric lymphadenopathy. --No pelvic or inguinal lymphadenopathy. Reproductive: The prostate gland is enlarged. Other: No ascites or free air. The abdominal wall is normal. Musculoskeletal. There are acute mildly displaced fractures of the right L1 through L4 transverse processes. Degenerative changes are noted throughout the lumbar spine. 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 There is mild anterolisthesis of L5 on S1. There are degenerative changes throughout the visualized lumbar spine, greatest at the L1-L2 and L2-L3 levels. Facet arthrosis is noted. There is a probable nondisplaced fracture through the left sacral ala (axial series 4, image 71 on the lumbar reconstructions). IMPRESSION: 1. Acute mildly displaced fractures of the right L1 through L4 transverse processes. 2. There is a minimally displaced fracture of the  posterior twelfth rib on the right. There is a nondisplaced fracture of the left lateral seventh rib. There is no pneumothorax. 3. Probable nondisplaced fracture through the left sacral ala. 4. Trace bilateral pleural effusions. 5. Cholelithiasis without acute inflammation. 6. Nonobstructive left nephrolithiasis. 7. Prostatomegaly. 8. Large amount of stool in the rectum. Aortic Atherosclerosis (ICD10-I70.0). Electronically Signed   By: Constance Holster M.D.   On: 10/28/2019 21:38   DG Knee Complete 4 Views Left  Result Date: 10/28/2019 CLINICAL DATA:  Status post fall. EXAM: LEFT KNEE - COMPLETE 4+ VIEW COMPARISON:  None. FINDINGS: No evidence of acute fracture or dislocation. Mild medial and lateral tibiofemoral compartment space narrowing is seen. Moderate to marked severity vascular calcification is noted. A very small joint effusion is present. IMPRESSION: 1. No evidence of acute fracture or dislocation. 2. Very small joint effusion. Electronically Signed   By: Virgina Norfolk M.D.   On: 10/28/2019 21:04   DG FEMUR MIN 2 VIEWS LEFT  Result Date: 10/28/2019 CLINICAL DATA:  Status post fall. EXAM: LEFT FEMUR 2 VIEWS COMPARISON:  None. FINDINGS: There is no evidence of fracture or other focal bone lesions. Moderate severity vascular calcification is seen. IMPRESSION: No acute osseous abnormality. Electronically Signed   By: Virgina Norfolk M.D.   On: 10/28/2019 21:01   DG FEMUR, MIN 2 VIEWS RIGHT  Result Date: 10/28/2019 CLINICAL DATA:  Status post fall. EXAM: RIGHT FEMUR 2 VIEWS COMPARISON:  None. FINDINGS: There is no evidence of fracture or other focal bone lesions. There is moderate to marked severity vascular calcification. IMPRESSION: 1. No acute osseous abnormality. Electronically Signed   By: Virgina Norfolk M.D.   On: 10/28/2019 21:02    ROS - all of the below systems have been reviewed with the patient and positives are indicated with bold text General: chills, fever or night  sweats Eyes: blurry vision or double vision ENT: epistaxis or sore throat Allergy/Immunology: itchy/watery eyes or nasal congestion Hematologic/Lymphatic: bleeding problems, blood clots or swollen lymph nodes Endocrine: temperature intolerance or unexpected weight changes Breast: new or changing breast lumps or nipple discharge Resp: cough, shortness of breath, or wheezing CV: chest pain or dyspnea on exertion GI: as per HPI GU: dysuria, trouble voiding, or hematuria MSK: joint pain or joint stiffness Neuro: TIA or stroke symptoms Derm: pruritus and skin lesion changes Psych: anxiety and depression  PE Blood pressure Marland Kitchen)  180/87, pulse 85, temperature (!) 97.5 F (36.4 C), temperature source Oral, resp. rate 17, SpO2 99 %. Constitutional: NAD; conversant but somewhat confused; no deformities Head: Small lac repaired on scalp by ED-P - staple Eyes: Moist conjunctiva; no lid lag; anicteric; pupils equal and round Neck: Trachea midline; no thyromegaly Lungs: Normal respiratory effort; no tactile fremitus CV: RRR; no palpable thrills; no pitting edema GI: Abd soft, minimally tender - mainly near costal margins; no rebound nor guarding; no palpable hepatosplenomegaly MSK: Normal range of motion of extremities; no clubbing/cyanosis. Back tenderness was noted over l-spine. No gross neurologic deficits Psychiatric: Appropriate affect; alert and oriented to person Lymphatic: No palpable cervical or axillary lymphadenopathy  Results for orders placed or performed during the hospital encounter of 10/28/19 (from the past 48 hour(s))  CBC with Differential     Status: Abnormal   Collection Time: 10/28/19  7:45 PM  Result Value Ref Range   WBC 14.2 (H) 4.0 - 10.5 K/uL   RBC 4.14 (L) 4.22 - 5.81 MIL/uL   Hemoglobin 13.1 13.0 - 17.0 g/dL   HCT 40.3 39 - 52 %   MCV 97.3 80.0 - 100.0 fL   MCH 31.6 26.0 - 34.0 pg   MCHC 32.5 30.0 - 36.0 g/dL   RDW 13.2 11.5 - 15.5 %   Platelets 209 150 - 400  K/uL   nRBC 0.0 0.0 - 0.2 %   Neutrophils Relative % 84 %   Neutro Abs 12.1 (H) 1.7 - 7.7 K/uL   Lymphocytes Relative 7 %   Lymphs Abs 0.9 0.7 - 4.0 K/uL   Monocytes Relative 5 %   Monocytes Absolute 0.8 0 - 1 K/uL   Eosinophils Relative 1 %   Eosinophils Absolute 0.2 0 - 0 K/uL   Basophils Relative 1 %   Basophils Absolute 0.1 0 - 0 K/uL   Immature Granulocytes 2 %   Abs Immature Granulocytes 0.21 (H) 0.00 - 0.07 K/uL    Comment: Performed at [facility], [address] [place], [place] [address]  Comprehensive metabolic panel     Status: Abnormal   Collection Time: 10/28/19  7:45 PM  Result Value Ref Range   Sodium 141 135 - 145 mmol/L   Potassium 4.3 3.5 - 5.1 mmol/L   Chloride 106 98 - 111 mmol/L   CO2 28 22 - 32 mmol/L   Glucose, Bld 106 (H) 70 - 99 mg/dL    Comment: Glucose reference range applies only to samples taken after fasting for at least 8 hours.   BUN 19 8 - 23 mg/dL   Creatinine, Ser 0.99 0.61 - 1.24 mg/dL   Calcium 9.1 8.9 - 10.3 mg/dL   Total Protein 6.3 (L) 6.5 - 8.1 g/dL   Albumin 3.9 3.5 - 5.0 g/dL   AST 17 15 - 41 U/L   ALT 6 0 - 44 U/L   Alkaline Phosphatase 94 38 - 126 U/L   Total Bilirubin 1.4 (H) 0.3 - 1.2 mg/dL   GFR calc non Af Amer >60 >60 mL/min   GFR calc Af Amer >60 >60 mL/min   Anion gap 7 5 - 15    Comment: Performed at [facility] [address], [place], [place] [address]  Lactic acid, plasma     Status: None   Collection Time: 10/28/19  8:28 PM  Result Value Ref Range   Lactic Acid, Venous 1.1 0.5 - 1.9 mmol/L    Comment: Performed at [facility], [place] [address] [place], [place] [address]  Ethanol     Status: None   Collection Time: 10/28/19  8:28 PM  Result Value Ref Range   Alcohol, Ethyl (B) <10 <10 mg/dL    Comment: (NOTE) Lowest detectable limit for serum alcohol is 10 mg/dL.  For medical purposes only. Performed at [facility], [place] [address], [place], [place] [address]   Sample to Blood  Bank     Status: None   Collection Time: 10/28/19  8:30 PM  Result Value Ref Range   Blood Bank Specimen SAMPLE AVAILABLE FOR TESTING    Sample Expiration      10/29/2019,2359 Performed at [facility], [place] [address], [place], [place] [address]   I-Stat Chem 8, ED     Status: Abnormal   Collection Time: 10/28/19  8:37 PM  Result Value Ref Range   Sodium 141 135 - 145 mmol/L   Potassium 3.7 3.5 - 5.1 mmol/L   Chloride 102 98 - 111 mmol/L   BUN 20 8 - 23 mg/dL   Creatinine, Ser 0.90 0.61 - 1.24 mg/dL   Glucose, Bld 100 (H) 70 - 99 mg/dL    Comment: Glucose reference range applies only to samples taken after fasting for at least 8 hours.   Calcium, Ion 1.20 1.15 - 1.40 mmol/L   TCO2 25 22 - 32 mmol/L   Hemoglobin 12.9 (L) 13.0 - 17.0 g/dL   HCT 38.0 (L) 39 - 52 %  POC CBG, ED     Status: None   Collection Time: 10/28/19  9:42 PM  Result Value Ref Range   Glucose-Capillary 81 70 - 99 mg/dL    Comment: Glucose reference range applies only to samples taken after fasting for at least 8 hours.    DG Chest 1 View  Result Date: 10/28/2019 CLINICAL DATA:  Status post fall. EXAM: CHEST  1 VIEW COMPARISON:  February 01, 2017 FINDINGS: There is no evidence of acute infiltrate, pleural effusion or pneumothorax. The heart size and mediastinal contours are within normal limits. There is moderate severity calcification of the aortic arch. An acute fracture of the seventh left rib is seen. IMPRESSION: Acute fracture of the seventh left rib. Electronically Signed   By: Virgina Norfolk M.D.   On: 10/28/2019 20:59   DG Pelvis 1-2 Views  Result Date: 10/28/2019 CLINICAL DATA:  Status post fall. EXAM: PELVIS - 1-2 VIEW COMPARISON:  None. FINDINGS: There is no evidence of pelvic fracture or diastasis. No pelvic bone lesions are seen. Mild degenerative changes seen within the bilateral hips. IMPRESSION: No acute osseous abnormality. Electronically Signed   By: Virgina Norfolk M.D.   On: 10/28/2019  21:05   CT HEAD WO CONTRAST  Result Date: 10/28/2019 CLINICAL DATA:  [address] downstairs, posterior scalp laceration, Parkinson's, dementia EXAM: CT HEAD WITHOUT CONTRAST CT CERVICAL SPINE WITHOUT CONTRAST TECHNIQUE: Multidetector CT imaging of the head and cervical spine was performed following the standard protocol without intravenous contrast. Multiplanar CT image reconstructions of the cervical spine were also generated. COMPARISON:  07/18/2019 FINDINGS: CT HEAD FINDINGS Brain: Confluent areas of decreased attenuation throughout the periventricular [place] matter consistent with chronic small vessel ischemic changes, stable. No sign of acute infarct or hemorrhage. Lateral ventricles and remaining midline structures are unremarkable. No acute extra-axial fluid collections. No mass effect. Vascular: No hyperdense vessel or unexpected calcification. Skull: Normal. Negative for fracture or focal lesion. Sinuses/Orbits: Postsurgical changes bilateral maxillary sinuses. There is chronic  mucosal thickening throughout the left maxillary sinus. Evidence of prior bilateral ethmoidectomies. Other: None. CT CERVICAL SPINE FINDINGS Alignment: Alignment is grossly anatomic. Skull base and vertebrae: No acute displaced fractures. Soft tissues and spinal canal: No prevertebral fluid or swelling. No visible canal hematoma. Disc levels: Mild multilevel spondylosis greatest at C4-5. There is diffuse facet hypertrophy. There is right predominant neural foraminal encroachment at the C4/C5 level. Upper chest: Airway is patent. Lung apices are clear. Other: Reconstructed images demonstrate no additional findings. IMPRESSION: 1. No acute intracranial process. Stable chronic small-vessel ischemic changes throughout the Susumu Hackler matter. 2. No acute cervical spine fracture. Electronically Signed   By: Randa Ngo M.D.   On: 10/28/2019 21:33   CT CHEST W CONTRAST  Result Date: 10/28/2019 CLINICAL DATA:  Acute pain due to trauma EXAM: CT  CHEST, ABDOMEN, AND PELVIS WITH CONTRAST CT LUMBAR SPINE WITHOUT CONTRAST TECHNIQUE: Multidetector CT imaging of the chest, abdomen and pelvis was performed following the standard protocol during bolus administration of intravenous contrast. CONTRAST:  125mL OMNIPAQUE IOHEXOL 350 MG/ML SOLN COMPARISON:  CT L-spine dated November 14, 2009. CT of the pelvis dated November 15, 2013. FINDINGS: CT CHEST FINDINGS Cardiovascular: The heart size is normal. There are atherosclerotic changes of the thoracic aorta without evidence for dissection or aneurysm. There are coronary artery calcifications. There is no significant pericardial effusion. There is no large centrally located pulmonary embolism. Mediastinum/Nodes: -- No mediastinal lymphadenopathy. -- No hilar lymphadenopathy. -- No axillary lymphadenopathy. -- No supraclavicular lymphadenopathy. -- Normal thyroid gland where visualized. -  Unremarkable esophagus. Lungs/Pleura: There are trace bilateral pleural effusions. There is no pneumothorax. There is no focal infiltrate. The trachea is unremarkable. Musculoskeletal: There is an acute minimally displaced fracture of the rudimentary twelfth rib on the right. There may be a nondisplaced buckle fracture of the twelfth rib on the left.There is a nondisplaced fracture of the lateral seventh rib. There are advanced degenerative changes of the bilateral glenohumeral joints. CT ABDOMEN PELVIS FINDINGS Hepatobiliary: There is a cyst in the left hepatic lobe. Cholelithiasis without acute inflammation.There is no biliary ductal dilation. Pancreas: Normal contours without ductal dilatation. No peripancreatic fluid collection. Spleen: Unremarkable. Adrenals/Urinary Tract: --Adrenal glands: Unremarkable. --Right kidney/ureter: No hydronephrosis or radiopaque kidney stones. --Left kidney/ureter: There is a nonobstructing stone in the upper pole the left kidney. --Urinary bladder: Unremarkable. Stomach/Bowel: --Stomach/Duodenum: No hiatal  hernia or other gastric abnormality. Normal duodenal course and caliber. --Small bowel: Unremarkable. --Colon: There is a large amount of stool in the rectum. --Appendix: Not visualized. No right lower quadrant inflammation or free fluid. Vascular/Lymphatic: Atherosclerotic calcification is present within the non-aneurysmal abdominal aorta, without hemodynamically significant stenosis. --No retroperitoneal lymphadenopathy. --No mesenteric lymphadenopathy. --No pelvic or inguinal lymphadenopathy. Reproductive: The prostate gland is enlarged. Other: No ascites or free air. The abdominal wall is normal. Musculoskeletal. There are acute mildly displaced fractures of the right L1 through L4 transverse processes. Degenerative changes are noted throughout the lumbar spine. 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 There is mild anterolisthesis of L5 on S1. There are degenerative changes throughout the visualized lumbar spine, greatest at the L1-L2 and L2-L3 levels. Facet arthrosis is noted. There is a probable nondisplaced fracture through the left sacral ala (axial series 4, image 71 on the lumbar reconstructions). IMPRESSION: 1. Acute mildly displaced fractures of the right L1 through L4 transverse processes. 2. There is a minimally displaced fracture of the posterior twelfth rib on the right. There is a nondisplaced fracture of  the left lateral seventh rib. There is no pneumothorax. 3. Probable nondisplaced fracture through the left sacral ala. 4. Trace bilateral pleural effusions. 5. Cholelithiasis without acute inflammation. 6. Nonobstructive left nephrolithiasis. 7. Prostatomegaly. 8. Large amount of stool in the rectum. Aortic Atherosclerosis (ICD10-I70.0). Electronically Signed   By: Constance Holster M.D.   On: 10/28/2019 21:38   CT CERVICAL SPINE WO CONTRAST  Result Date: 10/28/2019 CLINICAL DATA:  [address] downstairs, posterior scalp laceration, Parkinson's, dementia EXAM: CT HEAD WITHOUT CONTRAST  CT CERVICAL SPINE WITHOUT CONTRAST TECHNIQUE: Multidetector CT imaging of the head and cervical spine was performed following the standard protocol without intravenous contrast. Multiplanar CT image reconstructions of the cervical spine were also generated. COMPARISON:  07/18/2019 FINDINGS: CT HEAD FINDINGS Brain: Confluent areas of decreased attenuation throughout the periventricular [place]i matter consistent with chronic small vessel ischemic changes, stable. No sign of acute infarct or hemorrhage. Lateral ventricles and remaining midline structures are unremarkable. No acute extra-axial fluid collections. No mass effect. Vascular: No hyperdense vessel or unexpected calcification. Skull: Normal. Negative for fracture or focal lesion. Sinuses/Orbits: Postsurgical changes bilateral maxillary sinuses. There is chronic mucosal thickening throughout the left maxillary sinus. Evidence of prior bilateral ethmoidectomies. Other: None. CT CERVICAL SPINE FINDINGS Alignment: Alignment is grossly anatomic. Skull base and vertebrae: No acute displaced fractures. Soft tissues and spinal canal: No prevertebral fluid or swelling. No visible canal hematoma. Disc levels: Mild multilevel spondylosis greatest at C4-5. There is diffuse facet hypertrophy. There is right predominant neural foraminal encroachment at the C4/C5 level. Upper chest: Airway is patent. Lung apices are clear. Other: Reconstructed images demonstrate no additional findings. IMPRESSION: 1. No acute intracranial process. Stable chronic small-vessel ischemic changes throughout the  Grosser matter. 2. No acute cervical spine fracture. Electronically Signed   By: Randa Ngo M.D.   On: 10/28/2019 21:33   CT ABDOMEN PELVIS W CONTRAST  Result Date: 10/28/2019 CLINICAL DATA:  Acute pain due to trauma EXAM: CT CHEST, ABDOMEN, AND PELVIS WITH CONTRAST CT LUMBAR SPINE WITHOUT CONTRAST TECHNIQUE: Multidetector CT imaging of the chest, abdomen and pelvis was performed  following the standard protocol during bolus administration of intravenous contrast. CONTRAST:  154mL OMNIPAQUE IOHEXOL 350 MG/ML SOLN COMPARISON:  CT L-spine dated November 14, 2009. CT of the pelvis dated November 15, 2013. FINDINGS: CT CHEST FINDINGS Cardiovascular: The heart size is normal. There are atherosclerotic changes of the thoracic aorta without evidence for dissection or aneurysm. There are coronary artery calcifications. There is no significant pericardial effusion. There is no large centrally located pulmonary embolism. Mediastinum/Nodes: -- No mediastinal lymphadenopathy. -- No hilar lymphadenopathy. -- No axillary lymphadenopathy. -- No supraclavicular lymphadenopathy. -- Normal thyroid gland where visualized. -  Unremarkable esophagus. Lungs/Pleura: There are trace bilateral pleural effusions. There is no pneumothorax. There is no focal infiltrate. The trachea is unremarkable. Musculoskeletal: There is an acute minimally displaced fracture of the rudimentary twelfth rib on the right. There may be a nondisplaced buckle fracture of the twelfth rib on the left.There is a nondisplaced fracture of the lateral seventh rib. There are advanced degenerative changes of the bilateral glenohumeral joints. CT ABDOMEN PELVIS FINDINGS Hepatobiliary: There is a cyst in the left hepatic lobe. Cholelithiasis without acute inflammation.There is no biliary ductal dilation. Pancreas: Normal contours without ductal dilatation. No peripancreatic fluid collection. Spleen: Unremarkable. Adrenals/Urinary Tract: --Adrenal glands: Unremarkable. --Right kidney/ureter: No hydronephrosis or radiopaque kidney stones. --Left kidney/ureter: There is a nonobstructing stone in the upper pole the left kidney. --Urinary bladder: Unremarkable. Stomach/Bowel: --Stomach/Duodenum: No  hiatal hernia or other gastric abnormality. Normal duodenal course and caliber. --Small bowel: Unremarkable. --Colon: There is a large amount of stool in the rectum.  --Appendix: Not visualized. No right lower quadrant inflammation or free fluid. Vascular/Lymphatic: Atherosclerotic calcification is present within the non-aneurysmal abdominal aorta, without hemodynamically significant stenosis. --No retroperitoneal lymphadenopathy. --No mesenteric lymphadenopathy. --No pelvic or inguinal lymphadenopathy. Reproductive: The prostate gland is enlarged. Other: No ascites or free air. The abdominal wall is normal. Musculoskeletal. There are acute mildly displaced fractures of the right L1 through L4 transverse processes. Degenerative changes are noted throughout the lumbar spine. 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 There is mild anterolisthesis of L5 on S1. There are degenerative changes throughout the visualized lumbar spine, greatest at the L1-L2 and L2-L3 levels. Facet arthrosis is noted. There is a probable nondisplaced fracture through the left sacral ala (axial series 4, image 71 on the lumbar reconstructions). IMPRESSION: 1. Acute mildly displaced fractures of the right L1 through L4 transverse processes. 2. There is a minimally displaced fracture of the posterior twelfth rib on the right. There is a nondisplaced fracture of the left lateral seventh rib. There is no pneumothorax. 3. Probable nondisplaced fracture through the left sacral ala. 4. Trace bilateral pleural effusions. 5. Cholelithiasis without acute inflammation. 6. Nonobstructive left nephrolithiasis. 7. Prostatomegaly. 8. Large amount of stool in the rectum. Aortic Atherosclerosis (ICD10-I70.0). Electronically Signed   By: Constance Holster M.D.   On: 10/28/2019 21:38   CT L-SPINE NO CHARGE  Result Date: 10/28/2019 CLINICAL DATA:  Acute pain due to trauma EXAM: CT CHEST, ABDOMEN, AND PELVIS WITH CONTRAST CT LUMBAR SPINE WITHOUT CONTRAST TECHNIQUE: Multidetector CT imaging of the chest, abdomen and pelvis was performed following the standard protocol during bolus administration of intravenous  contrast. CONTRAST:  140mL OMNIPAQUE IOHEXOL 350 MG/ML SOLN COMPARISON:  CT L-spine dated November 14, 2009. CT of the pelvis dated November 15, 2013. FINDINGS: CT CHEST FINDINGS Cardiovascular: The heart size is normal. There are atherosclerotic changes of the thoracic aorta without evidence for dissection or aneurysm. There are coronary artery calcifications. There is no significant pericardial effusion. There is no large centrally located pulmonary embolism. Mediastinum/Nodes: -- No mediastinal lymphadenopathy. -- No hilar lymphadenopathy. -- No axillary lymphadenopathy. -- No supraclavicular lymphadenopathy. -- Normal thyroid gland where visualized. -  Unremarkable esophagus. Lungs/Pleura: There are trace bilateral pleural effusions. There is no pneumothorax. There is no focal infiltrate. The trachea is unremarkable. Musculoskeletal: There is an acute minimally displaced fracture of the rudimentary twelfth rib on the right. There may be a nondisplaced buckle fracture of the twelfth rib on the left.There is a nondisplaced fracture of the lateral seventh rib. There are advanced degenerative changes of the bilateral glenohumeral joints. CT ABDOMEN PELVIS FINDINGS Hepatobiliary: There is a cyst in the left hepatic lobe. Cholelithiasis without acute inflammation.There is no biliary ductal dilation. Pancreas: Normal contours without ductal dilatation. No peripancreatic fluid collection. Spleen: Unremarkable. Adrenals/Urinary Tract: --Adrenal glands: Unremarkable. --Right kidney/ureter: No hydronephrosis or radiopaque kidney stones. --Left kidney/ureter: There is a nonobstructing stone in the upper pole the left kidney. --Urinary bladder: Unremarkable. Stomach/Bowel: --Stomach/Duodenum: No hiatal hernia or other gastric abnormality. Normal duodenal course and caliber. --Small bowel: Unremarkable. --Colon: There is a large amount of stool in the rectum. --Appendix: Not visualized. No right lower quadrant inflammation or free  fluid. Vascular/Lymphatic: Atherosclerotic calcification is present within the non-aneurysmal abdominal aorta, without hemodynamically significant stenosis. --No retroperitoneal lymphadenopathy. --No mesenteric lymphadenopathy. --No pelvic or inguinal  lymphadenopathy. Reproductive: The prostate gland is enlarged. Other: No ascites or free air. The abdominal wall is normal. Musculoskeletal. There are acute mildly displaced fractures of the right L1 through L4 transverse processes. Degenerative changes are noted throughout the lumbar spine. 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 There is mild anterolisthesis of L5 on S1. There are degenerative changes throughout the visualized lumbar spine, greatest at the L1-L2 and L2-L3 levels. Facet arthrosis is noted. There is a probable nondisplaced fracture through the left sacral ala (axial series 4, image 71 on the lumbar reconstructions). IMPRESSION: 1. Acute mildly displaced fractures of the right L1 through L4 transverse processes. 2. There is a minimally displaced fracture of the posterior twelfth rib on the right. There is a nondisplaced fracture of the left lateral seventh rib. There is no pneumothorax. 3. Probable nondisplaced fracture through the left sacral ala. 4. Trace bilateral pleural effusions. 5. Cholelithiasis without acute inflammation. 6. Nonobstructive left nephrolithiasis. 7. Prostatomegaly. 8. Large amount of stool in the rectum. Aortic Atherosclerosis (ICD10-I70.0). Electronically Signed   By: Constance Holster M.D.   On: 10/28/2019 21:38   DG Knee Complete 4 Views Left  Result Date: 10/28/2019 CLINICAL DATA:  Status post fall. EXAM: LEFT KNEE - COMPLETE 4+ VIEW COMPARISON:  None. FINDINGS: No evidence of acute fracture or dislocation. Mild medial and lateral tibiofemoral compartment space narrowing is seen. Moderate to marked severity vascular calcification is noted. A very small joint effusion is present. IMPRESSION: 1. No evidence  of acute fracture or dislocation. 2. Very small joint effusion. Electronically Signed   By: Virgina Norfolk M.D.   On: 10/28/2019 21:04   DG FEMUR MIN 2 VIEWS LEFT  Result Date: 10/28/2019 CLINICAL DATA:  Status post fall. EXAM: LEFT FEMUR 2 VIEWS COMPARISON:  None. FINDINGS: There is no evidence of fracture or other focal bone lesions. Moderate severity vascular calcification is seen. IMPRESSION: No acute osseous abnormality. Electronically Signed   By: Virgina Norfolk M.D.   On: 10/28/2019 21:01   DG FEMUR, MIN 2 VIEWS RIGHT  Result Date: 10/28/2019 CLINICAL DATA:  Status post fall. EXAM: RIGHT FEMUR 2 VIEWS COMPARISON:  None. FINDINGS: There is no evidence of fracture or other focal bone lesions. There is moderate to marked severity vascular calcification. IMPRESSION: 1. No acute osseous abnormality. Electronically Signed   By: Virgina Norfolk M.D.   On: 10/28/2019 21:02     A/P: DILLIN LOFGREN is an 86 y.o. male with Parkinson's dementia, recurrent syncope and frequent falls, HTN, HLD, DM here following fall down 11-12 steps  L1-L4 TP fxs; possible L sacral ala fx - Dr. Vertell Limber consulted by ED-P recommended brace L 7th and R 12th rib fxs - multimodal pain control - tylenol scheduled; ibuprofen 400-600 mg every 6 hours as needed; oxycodone 5 mg every 6 hrs as needed; diludid 0.5 mg IV q 3 hrs prn. Pulmonary toilet - IS hourly while awake; prn oxygen Syncope evaluation/workup - as per medicine - we appreciate assistance in his care and will follow with you Diet as tolerated Would likely benefit from sitter vs close proximity to RN station given frequent falls and dementia  Sharon Mt. Dema Severin, M.D. [facility] Use [email or web address] to contact on call provider

## 2019-10-28 NOTE — ED Notes (Signed)
Pt to xr 

## 2019-10-28 NOTE — ED Triage Notes (Signed)
Pt arrived via [facility] from home following a mechanical fall down stairs of approx 10-12 steps. Pt presents with a laceration to back of head. Pt Hx includes dementia and parkinson's and is unable to a. Pt able to move all limbs and c/o of lumbar pain but per EMS this is chronic.

## 2019-10-29 DIAGNOSIS — S32029A Unspecified fracture of second lumbar vertebra, initial encounter for closed fracture: Secondary | ICD-10-CM | POA: Diagnosis present

## 2019-10-29 DIAGNOSIS — F329 Major depressive disorder, single episode, unspecified: Secondary | ICD-10-CM

## 2019-10-29 DIAGNOSIS — W19XXXA Unspecified fall, initial encounter: Secondary | ICD-10-CM | POA: Insufficient documentation

## 2019-10-29 DIAGNOSIS — S32009A Unspecified fracture of unspecified lumbar vertebra, initial encounter for closed fracture: Secondary | ICD-10-CM | POA: Insufficient documentation

## 2019-10-29 DIAGNOSIS — S2242XA Multiple fractures of ribs, left side, initial encounter for closed fracture: Secondary | ICD-10-CM | POA: Insufficient documentation

## 2019-10-29 DIAGNOSIS — G20 Parkinson's disease: Secondary | ICD-10-CM

## 2019-10-29 DIAGNOSIS — R451 Restlessness and agitation: Secondary | ICD-10-CM | POA: Diagnosis not present

## 2019-10-29 DIAGNOSIS — S2243XA Multiple fractures of ribs, bilateral, initial encounter for closed fracture: Secondary | ICD-10-CM | POA: Diagnosis present

## 2019-10-29 DIAGNOSIS — F0280 Dementia in other diseases classified elsewhere without behavioral disturbance: Secondary | ICD-10-CM | POA: Diagnosis present

## 2019-10-29 DIAGNOSIS — R55 Syncope and collapse: Secondary | ICD-10-CM | POA: Diagnosis not present

## 2019-10-29 DIAGNOSIS — Y92018 Other place in single-family (private) house as the place of occurrence of the external cause: Secondary | ICD-10-CM | POA: Diagnosis not present

## 2019-10-29 DIAGNOSIS — S32049A Unspecified fracture of fourth lumbar vertebra, initial encounter for closed fracture: Secondary | ICD-10-CM | POA: Diagnosis present

## 2019-10-29 DIAGNOSIS — S3210XA Unspecified fracture of sacrum, initial encounter for closed fracture: Secondary | ICD-10-CM | POA: Diagnosis not present

## 2019-10-29 DIAGNOSIS — W109XXA Fall (on) (from) unspecified stairs and steps, initial encounter: Secondary | ICD-10-CM | POA: Diagnosis present

## 2019-10-29 DIAGNOSIS — I10 Essential (primary) hypertension: Secondary | ICD-10-CM | POA: Diagnosis present

## 2019-10-29 DIAGNOSIS — R627 Adult failure to thrive: Secondary | ICD-10-CM | POA: Diagnosis present

## 2019-10-29 DIAGNOSIS — R630 Anorexia: Secondary | ICD-10-CM | POA: Diagnosis present

## 2019-10-29 DIAGNOSIS — R1313 Dysphagia, pharyngeal phase: Secondary | ICD-10-CM | POA: Diagnosis present

## 2019-10-29 DIAGNOSIS — N3281 Overactive bladder: Secondary | ICD-10-CM

## 2019-10-29 DIAGNOSIS — R52 Pain, unspecified: Secondary | ICD-10-CM | POA: Insufficient documentation

## 2019-10-29 DIAGNOSIS — E785 Hyperlipidemia, unspecified: Secondary | ICD-10-CM | POA: Diagnosis present

## 2019-10-29 DIAGNOSIS — S32019A Unspecified fracture of first lumbar vertebra, initial encounter for closed fracture: Secondary | ICD-10-CM | POA: Diagnosis present

## 2019-10-29 DIAGNOSIS — Z515 Encounter for palliative care: Secondary | ICD-10-CM | POA: Diagnosis not present

## 2019-10-29 DIAGNOSIS — S32039A Unspecified fracture of third lumbar vertebra, initial encounter for closed fracture: Secondary | ICD-10-CM | POA: Diagnosis present

## 2019-10-29 DIAGNOSIS — Z681 Body mass index (BMI) 19 or less, adult: Secondary | ICD-10-CM | POA: Diagnosis not present

## 2019-10-29 DIAGNOSIS — E440 Moderate protein-calorie malnutrition: Secondary | ICD-10-CM | POA: Diagnosis present

## 2019-10-29 DIAGNOSIS — Z66 Do not resuscitate: Secondary | ICD-10-CM | POA: Diagnosis present

## 2019-10-29 DIAGNOSIS — S0101XA Laceration without foreign body of scalp, initial encounter: Secondary | ICD-10-CM | POA: Diagnosis present

## 2019-10-29 DIAGNOSIS — G909 Disorder of the autonomic nervous system, unspecified: Secondary | ICD-10-CM | POA: Diagnosis present

## 2019-10-29 DIAGNOSIS — S32119A Unspecified Zone I fracture of sacrum, initial encounter for closed fracture: Secondary | ICD-10-CM | POA: Diagnosis present

## 2019-10-29 DIAGNOSIS — Z20822 Contact with and (suspected) exposure to covid-19: Secondary | ICD-10-CM | POA: Diagnosis present

## 2019-10-29 LAB — CBC
HCT: 38.8 % — ABNORMAL LOW (ref 39.0–52.0)
HCT: 38.6 % — ABNORMAL LOW (ref 39.0–52.0)
Hemoglobin: 12.6 g/dL — ABNORMAL LOW (ref 13.0–17.0)
Hemoglobin: 12.8 g/dL — ABNORMAL LOW (ref 13.0–17.0)
MCH: 31.2 pg (ref 26.0–34.0)
MCH: 31.8 pg (ref 26.0–34.0)
MCHC: 32.5 g/dL (ref 30.0–36.0)
MCHC: 33.2 g/dL (ref 30.0–36.0)
MCV: 96 fL (ref 80.0–100.0)
MCV: 95.8 fL (ref 80.0–100.0)
Platelets: 239 10*3/uL (ref 150–400)
Platelets: 251 10*3/uL (ref 150–400)
RBC: 4.04 MIL/uL — ABNORMAL LOW (ref 4.22–5.81)
RBC: 4.03 MIL/uL — ABNORMAL LOW (ref 4.22–5.81)
RDW: 13 % (ref 11.5–15.5)
RDW: 13 % (ref 11.5–15.5)
WBC: 13.8 10*3/uL — ABNORMAL HIGH (ref 4.0–10.5)
WBC: 13.6 10*3/uL — ABNORMAL HIGH (ref 4.0–10.5)
nRBC: 0 % (ref 0.0–0.2)
nRBC: 0 % (ref 0.0–0.2)

## 2019-10-29 LAB — CREATININE, SERUM
Creatinine, Ser: 0.96 mg/dL (ref 0.61–1.24)
GFR calc Af Amer: >60 mL/min (ref >60)
GFR calc non Af Amer: >60 mL/min (ref >60)

## 2019-10-29 LAB — PROTIME-INR
INR: 1.2 (ref 0.8–1.2)
Prothrombin Time: 15 seconds (ref 11.4–15.2)

## 2019-10-29 LAB — SARS CORONAVIRUS 2 BY RT PCR (HOSPITAL ORDER, PERFORMED IN ~~LOC~~ HOSPITAL LAB): SARS Coronavirus 2: NEGATIVE

## 2019-10-29 MED ORDER — SODIUM CHLORIDE 0.9% FLUSH
3.0000 mL | Freq: Two times a day (BID) | INTRAVENOUS | Status: DC
  Administered 2019-10-29 – 2019-11-02 (×5): 3 mL via INTRAVENOUS

## 2019-10-29 MED ORDER — IBUPROFEN 200 MG PO TABS
400.0000 mg | ORAL_TABLET | Freq: Four times a day (QID) | ORAL | Status: DC | PRN
  Administered 2019-10-30: 600 mg via ORAL
  Filled 2019-10-29: qty 3

## 2019-10-29 MED ORDER — HYDROMORPHONE HCL 1 MG/ML IJ SOLN
0.5000 mg | INTRAMUSCULAR | Status: DC | PRN
  Administered 2019-10-29: 0.5 mg via INTRAVENOUS
  Filled 2019-10-29: qty 1

## 2019-10-29 MED ORDER — POLYETHYLENE GLYCOL 3350 17 G PO PACK
17.0000 g | PACK | Freq: Every day | ORAL | Status: DC
  Administered 2019-10-29 – 2019-11-02 (×5): 17 g via ORAL
  Filled 2019-10-29 (×5): qty 1

## 2019-10-29 MED ORDER — HYDROMORPHONE HCL 1 MG/ML IJ SOLN: 0.5000 mg | INTRAMUSCULAR | Status: DC | PRN

## 2019-10-29 MED ORDER — DOCUSATE SODIUM 50 MG PO CAPS: 50.0000 mg | ORAL_CAPSULE | ORAL | Status: DC

## 2019-10-29 MED ORDER — HALOPERIDOL LACTATE 5 MG/ML IJ SOLN
2.0000 mg | Freq: Once | INTRAMUSCULAR | Status: AC
  Administered 2019-10-29: 2 mg via INTRAVENOUS
  Filled 2019-10-29: qty 1

## 2019-10-29 MED ORDER — ENOXAPARIN SODIUM 40 MG/0.4ML ~~LOC~~ SOLN: 40.0000 mg | SUBCUTANEOUS | Status: DC

## 2019-10-29 MED ORDER — CALCIUM CARBONATE ANTACID 500 MG PO CHEW: 1.0000 | CHEWABLE_TABLET | ORAL | Status: DC | PRN

## 2019-10-29 MED ORDER — SORBITOL 70 % SOLN
960.0000 mL | TOPICAL_OIL | Freq: Once | ORAL | Status: AC
  Administered 2019-10-29: 960 mL via RECTAL
  Filled 2019-10-29: qty 473

## 2019-10-29 MED ORDER — ALPRAZOLAM 0.25 MG PO TABS
0.2500 mg | ORAL_TABLET | Freq: Three times a day (TID) | ORAL | Status: DC | PRN
  Administered 2019-10-30 – 2019-10-31 (×3): 0.25 mg via ORAL
  Filled 2019-10-29 (×3): qty 1

## 2019-10-29 MED ORDER — SERTRALINE HCL 50 MG PO TABS
150.0000 mg | ORAL_TABLET | Freq: Every day | ORAL | Status: DC
  Administered 2019-10-29 – 2019-11-02 (×5): 150 mg via ORAL
  Filled 2019-10-29 (×5): qty 1

## 2019-10-29 MED ORDER — ACETAMINOPHEN 650 MG RE SUPP: 650.0000 mg | Freq: Four times a day (QID) | RECTAL | Status: DC | PRN

## 2019-10-29 MED ORDER — CA CARBONATE-MAG HYDROXIDE 550-110 MG PO CHEW: CHEWABLE_TABLET | ORAL | Status: DC | PRN

## 2019-10-29 MED ORDER — MIRABEGRON ER 50 MG PO TB24
50.0000 mg | ORAL_TABLET | Freq: Every day | ORAL | Status: DC
  Administered 2019-10-29 – 2019-11-01 (×4): 50 mg via ORAL
  Filled 2019-10-29 (×5): qty 1

## 2019-10-29 MED ORDER — ENOXAPARIN SODIUM 40 MG/0.4ML ~~LOC~~ SOLN
40.0000 mg | SUBCUTANEOUS | Status: DC
  Administered 2019-10-29 – 2019-11-02 (×5): 40 mg via SUBCUTANEOUS
  Filled 2019-10-29 (×5): qty 0.4

## 2019-10-29 MED ORDER — CARBIDOPA-LEVODOPA 25-100 MG PO TABS
2.0000 | ORAL_TABLET | Freq: Four times a day (QID) | ORAL | Status: DC
  Administered 2019-10-29 – 2019-11-02 (×16): 2 via ORAL
  Filled 2019-10-29 (×22): qty 2

## 2019-10-29 MED ORDER — TAMSULOSIN HCL 0.4 MG PO CAPS
0.4000 mg | ORAL_CAPSULE | Freq: Every day | ORAL | Status: DC
  Administered 2019-10-29 – 2019-11-01 (×4): 0.4 mg via ORAL
  Filled 2019-10-29 (×4): qty 1

## 2019-10-29 MED ORDER — ACETAMINOPHEN 325 MG PO TABS
650.0000 mg | ORAL_TABLET | Freq: Four times a day (QID) | ORAL | Status: DC | PRN
  Administered 2019-10-29 – 2019-11-02 (×3): 650 mg via ORAL
  Filled 2019-10-29 (×3): qty 2

## 2019-10-29 MED ORDER — POLYETHYLENE GLYCOL 3350 17 G PO PACK: 17.0000 g | PACK | Freq: Every day | ORAL | Status: DC | PRN

## 2019-10-29 MED ORDER — SODIUM CHLORIDE 0.9 % IV SOLN: INTRAVENOUS | Status: AC

## 2019-10-29 MED ORDER — OXYCODONE HCL 5 MG PO TABS
5.0000 mg | ORAL_TABLET | Freq: Four times a day (QID) | ORAL | Status: DC | PRN
  Filled 2019-10-29: qty 1

## 2019-10-29 MED ORDER — SENNOSIDES-DOCUSATE SODIUM 8.6-50 MG PO TABS
1.0000 | ORAL_TABLET | Freq: Two times a day (BID) | ORAL | Status: DC
  Administered 2019-10-29 – 2019-11-02 (×8): 1 via ORAL
  Filled 2019-10-29 (×10): qty 1

## 2019-10-29 MED ORDER — FLUTICASONE PROPIONATE 50 MCG/ACT NA SUSP
2.0000 | Freq: Every day | NASAL | Status: DC | PRN
  Filled 2019-10-29: qty 16

## 2019-10-29 NOTE — Progress Notes (Signed)
Orthostatic vital signs not completed upon admission due to pt discomfort.

## 2019-10-29 NOTE — ED Notes (Signed)
Pt found with TLSO brace off and all monitoring equipment removed. Pt confused and oriented only to self. Pt expressed concern that staff is trying to kill him. Attempted calming techniques. Unsuccessful.  Text/page midlevel. Haldol ordered. Ortho tech place brace back on. Pt now appears asleep with equal rise and fall of chest. Will continue to [place]

## 2019-10-29 NOTE — ED Notes (Signed)
Report given to Ashley

## 2019-10-29 NOTE — Consult Note (Signed)
Reason for Consult:multiple lumbar transverse process fractures after a fall Referring Physician: Fahim [name] [name] is an 86 y.o. male.   HPI: [name] is a 86 y.o. male with medical history significant of Parkinson's Disease, overactive bladder, osteoarthritis, depression, HTN, HLD, GERD, anxiety, BPH, h/o bladder cancer, DDD who presents after a fall.  Mr. [name] has been having a decline in function in the last few months with unsteadiness on his feet due to parkinson's disease as well as worsening dementia. He lost his footing while at home and fell down about 12 steps.  His wife and family heard the fall. During the fall, he was apparently conscious and speaking to himself per his wife.  He is unsure if he lost consciousness, but feels that he just slipped.  He does have a history of recurrent syncope and has been seeing cardiology for this. In the ED, he was found to have acute mildly displaced fractures of the right L1-L4 transverse processes and a back brace was placed, per my recommendation.  Further found to have minimally displaced fracture of the twelfth rib on the right and a non displaced fracture of the seventh lateral rib.  He has trace effusions bilaterally and probable non displaced fracture through the left sacral ala.  Also note to have gallstones, prostatomegaly and large amount of stool in the rectum.  Gen Surg was consulted and recommended a pain regimen and will follow.  Xrays of the hips and knees showed no fracture.     Past Medical History:  Diagnosis Date  . Allergic rhinitis   . Anxiety   . Bladder cancer [facility])    urologist-  dr [name]--  low grade TA  . BPH with elevated PSA   . Complication of anesthesia    "paranoid after back surgery in october lasted 2 to 3 days"  . DDD (degenerative disc disease), lumbar   . Depression   . Dry mouth    USES BIOTIN SPRAY/ MOUTHWASH  . Frequency of urination   . GERD (gastroesophageal reflux disease)   . Hearing loss     does not wear his hearing aid  . History of kidney stones    10/1989  . Hyperlipidemia   . Hypertension   . Mild obstructive sleep apnea    PER PT  MILD OSA , NO CPAP RX,  STUDY DONE 2009  . Nocturia   . OAB (overactive bladder)   . Osteoarthritis    KNEES, SHOULDER  . Parkinson disease ([name]) DX   2005   NEUROLOGIST-   DR TAT--  IDIOPATHIC PARKINSON'S /  TREMORS CONTROLLED WITH MEDS  . Urge urinary incontinence     Past Surgical History:  Procedure Laterality Date  . CATARACT EXTRACTION W/ INTRAOCULAR LENS  IMPLANT, BILATERAL  2014  . CYSTOSCOPY Bilateral 04/09/2017   Procedure: CYSTOSCOPY/ RETROGRADE;  Surgeon: Festus Aloe, MD;  Location: [facility];  Service: Urology;  Laterality: Bilateral;  . CYSTOSCOPY W/ RETROGRADES Bilateral 12/14/2014   Procedure: CYSTOSCOPY BLADDER BIOPSY FULGERATION MITOMYCIN C BILATERAL RETROGRADE PYELOGRAM;  Surgeon: Festus Aloe, MD;  Location: [facility];  Service: Urology;  Laterality: Bilateral;  . CYSTOSCOPY WITH STENT PLACEMENT Left 12/19/2013   Procedure: CYSTOSCOPY BLADDER BX, LEFT URETERAL STENT PLACEMENT , LEFT RETROGRADE AND PYLOGRAM;  Surgeon: Festus Aloe, MD;  Location: [facility];  Service: Urology;  Laterality: Left;  . EYE SURGERY Bilateral    ioc for cataract  . HERNIA REPAIR  2013  inguinal  . INGUINAL HERNIA REPAIR  03/09/2012   Procedure: HERNIA REPAIR INGUINAL ADULT;  Surgeon: Adin Hector, MD;  Location: [facility];  Service: General;  Laterality: Right;  . INSERTION OF MESH  03/09/2012   Procedure: INSERTION OF MESH;  Surgeon: Adin Hector, MD;  Location: [facility];  Service: General;  Laterality: Right;  . LUMBAR LAMINECTOMY/DECOMPRESSION MICRODISCECTOMY N/A 02/05/2017   Procedure: Laminectomy and Foraminotomy - Lumbar One-Two,Lumbar Two-Three, Lumbar Three-Four.;  Surgeon: Eustace Moore, MD;  Location: [place];  Service: Neurosurgery;  Laterality: N/A;  . NASAL SINUS SURGERY  1982  . ORIF  RIGHT ARM FX  1949   HARDWARE REMOVED   . REMOVAL BENIGN CYST LEFT FOREHEAD  1969  . TRANSURETHRAL RESECTION OF BLADDER TUMOR N/A 12/19/2013   Procedure:  TRANSURETHRAL RESECTION OF BLADDER TUMOR WITH GYRUS (TURBT-GYRUS);  Surgeon: Festus Aloe, MD;  Location: [facility];  Service: Urology;  Laterality: N/A;  . TRANSURETHRAL RESECTION OF BLADDER TUMOR N/A 04/09/2017   Procedure: TRANSURETHRAL RESECTION OF BLADDER TUMOR / (TURBT) 2-5cm;  Surgeon: Festus Aloe, MD;  Location: [facility];  Service: Urology;  Laterality: N/A;  ONLY NEEDS 90 MIN FOR BOTH PROCEDURES    Family History  Problem Relation Age of Onset  . Alzheimer's disease Mother   . Liver cancer Father   . Cancer Father        colon  . Healthy Daughter   . Diabetes Son   . Prostate cancer Neg Hx   . CAD Neg Hx     Social History:  reports that he quit smoking about 51 years ago. His smoking use included cigarettes. He has a 15.00 pack-year smoking history. He has never used smokeless tobacco. He reports current alcohol use. He reports that he does not use drugs.  Allergies:  Allergies  Allergen Reactions  . Pollen Extract Other (See Comments)  . Other Other (See Comments)    Paranoia from anesthesia drug last visit-unknown drug     Medications: I have reviewed the patient's current medications.  Results for orders placed or performed during the hospital encounter of 10/28/19 (from the past 48 hour(s))  CBC with Differential     Status: Abnormal   Collection Time: 10/28/19  7:45 PM  Result Value Ref Range   WBC 14.2 (H) 4.0 - 10.5 K/uL   RBC 4.14 (L) 4.22 - 5.81 MIL/uL   Hemoglobin 13.1 13.0 - 17.0 g/dL   HCT 40.3 39 - 52 %   MCV 97.3 80.0 - 100.0 fL   MCH 31.6 26.0 - 34.0 pg   MCHC 32.5 30.0 - 36.0 g/dL   RDW 13.2 11.5 - 15.5 %   Platelets 209 150 - 400 K/uL   nRBC 0.0 0.0 - 0.2 %   Neutrophils Relative % 84 %   Neutro Abs 12.1 (H) 1.7 - 7.7 K/uL   Lymphocytes Relative 7 %   Lymphs Abs 0.9  0.7 - 4.0 K/uL   Monocytes Relative 5 %   Monocytes Absolute 0.8 0 - 1 K/uL   Eosinophils Relative 1 %   Eosinophils Absolute 0.2 0 - 0 K/uL   Basophils Relative 1 %   Basophils Absolute 0.1 0 - 0 K/uL   Immature Granulocytes 2 %   Abs Immature Granulocytes 0.21 (H) 0.00 - 0.07 K/uL    Comment: Performed at [facility], [address], [place], [place] [address]  Comprehensive metabolic panel     Status: Abnormal   Collection  Time: 10/28/19  7:45 PM  Result Value Ref Range   Sodium 141 135 - 145 mmol/L   Potassium 4.3 3.5 - 5.1 mmol/L   Chloride 106 98 - 111 mmol/L   CO2 28 22 - 32 mmol/L   Glucose, Bld 106 (H) 70 - 99 mg/dL    Comment: Glucose reference range applies only to samples taken after fasting for at least 8 hours.   BUN 19 8 - 23 mg/dL   Creatinine, Ser 0.99 0.61 - 1.24 mg/dL   Calcium 9.1 8.9 - 10.3 mg/dL   Total Protein 6.3 (L) 6.5 - 8.1 g/dL   Albumin 3.9 3.5 - 5.0 g/dL   AST 17 15 - 41 U/L   ALT 6 0 - 44 U/L   Alkaline Phosphatase 94 38 - 126 U/L   Total Bilirubin 1.4 (H) 0.3 - 1.2 mg/dL   GFR calc non Af Amer >60 >60 mL/min   GFR calc Af Amer >60 >60 mL/min   Anion gap 7 5 - 15    Comment: Performed at [facility] [address], [place], [place] [address]  Lactic acid, plasma     Status: None   Collection Time: 10/28/19  8:28 PM  Result Value Ref Range   Lactic Acid, Venous 1.1 0.5 - 1.9 mmol/L    Comment: Performed at [facility] [address], [place], [place] [address]  Ethanol     Status: None   Collection Time: 10/28/19  8:28 PM  Result Value Ref Range   Alcohol, Ethyl (B) <10 <10 mg/dL    Comment: (NOTE) Lowest detectable limit for serum alcohol is 10 mg/dL.  For medical purposes only. Performed at [facility], [place] [address], [place], [place] [address]   Sample to Blood Bank     Status: None   Collection Time: 10/28/19  8:30 PM  Result Value Ref Range   Blood Bank Specimen SAMPLE AVAILABLE FOR TESTING    Sample  Expiration      10/29/2019,2359 Performed at [facility], [place] [address] [place], [place] [address]   I-Stat Chem 8, ED     Status: Abnormal   Collection Time: 10/28/19  8:37 PM  Result Value Ref Range   Sodium 141 135 - 145 mmol/L   Potassium 3.7 3.5 - 5.1 mmol/L   Chloride 102 98 - 111 mmol/L   BUN 20 8 - 23 mg/dL   Creatinine, Ser 0.90 0.61 - 1.24 mg/dL   Glucose, Bld 100 (H) 70 - 99 mg/dL    Comment: Glucose reference range applies only to samples taken after fasting for at least 8 hours.   Calcium, Ion 1.20 1.15 - 1.40 mmol/L   TCO2 25 22 - 32 mmol/L   Hemoglobin 12.9 (L) 13.0 - 17.0 g/dL   HCT 38.0 (L) 39 - 52 %  POC CBG, ED     Status: None   Collection Time: 10/28/19  9:42 PM  Result Value Ref Range   Glucose-Capillary 81 70 - 99 mg/dL    Comment: Glucose reference range applies only to samples taken after fasting for at least 8 hours.  SARS Coronavirus 2 by RT PCR (hospital order, performed in [facility] hospital lab) Nasopharyngeal Nasopharyngeal Swab     Status: None   Collection Time: 10/28/19 10:39 PM   Specimen: Nasopharyngeal Swab  Result Value Ref Range   SARS Coronavirus 2 NEGATIVE NEGATIVE    Comment: (NOTE) SARS-CoV-2 target nucleic acids  are NOT DETECTED.  The SARS-CoV-2 RNA is generally detectable in upper and lower respiratory specimens during the acute phase of infection. The lowest concentration of SARS-CoV-2 viral copies this assay can detect is 250 copies / mL. A negative result does not preclude SARS-CoV-2 infection and should not be used as the sole basis for treatment or other patient management decisions.  A negative result may occur with improper specimen collection / handling, submission of specimen other than nasopharyngeal swab, presence of viral mutation(s) within the areas targeted by this assay, and inadequate number of viral copies (<250 copies / mL). A negative result must be combined with clinical observations, patient history,  and epidemiological information.  Fact Sheet for Patients:   StrictlyIdeas.no  Fact Sheet for Healthcare Providers: BankingDealers.co.za  This test is not yet approved or  cleared by the [place] FDA and has been authorized for detection and/or diagnosis of SARS-CoV-2 by FDA under an Emergency Use Authorization (EUA).  This EUA will remain in effect (meaning this test can be used) for the duration of the COVID-19 declaration under Section 564(b)(1) of the Act, 21 U.S.C. section 360bbb-3(b)(1), unless the authorization is terminated or revoked sooner.  Performed at [facility], [place] [address] [place], [place] [address]   Urinalysis, Routine w reflex microscopic     Status: Abnormal   Collection Time: 10/28/19 11:18 PM  Result Value Ref Range   Color, Urine YELLOW YELLOW   APPearance CLEAR CLEAR   Specific Gravity, Urine 1.038 (H) 1.005 - 1.030   pH 5.0 5.0 - 8.0   Glucose, UA NEGATIVE NEGATIVE mg/dL   Hgb urine dipstick SMALL (A) NEGATIVE   Bilirubin Urine NEGATIVE NEGATIVE   Ketones, ur 5 (A) NEGATIVE mg/dL   Protein, ur NEGATIVE NEGATIVE mg/dL   Nitrite NEGATIVE NEGATIVE   Leukocytes,Ua NEGATIVE NEGATIVE   RBC / HPF 0-5 0 - 5 RBC/hpf   WBC, UA 0-5 0 - 5 WBC/hpf   Bacteria, UA NONE SEEN NONE SEEN   Mucus PRESENT    Hyaline Casts, UA PRESENT     Comment: Performed at [facility] [address], [place], [place] [address]    DG Chest 1 View  Result Date: 10/28/2019 CLINICAL DATA:  Status post fall. EXAM: CHEST  1 VIEW COMPARISON:  February 01, 2017 FINDINGS: There is no evidence of acute infiltrate, pleural effusion or pneumothorax. The heart size and mediastinal contours are within normal limits. There is moderate severity calcification of the aortic arch. An acute fracture of the seventh left rib is seen. IMPRESSION: Acute fracture of the seventh left rib. Electronically Signed   By: Virgina Norfolk M.D.    On: 10/28/2019 20:59   DG Pelvis 1-2 Views  Result Date: 10/28/2019 CLINICAL DATA:  Status post fall. EXAM: PELVIS - 1-2 VIEW COMPARISON:  None. FINDINGS: There is no evidence of pelvic fracture or diastasis. No pelvic bone lesions are seen. Mild degenerative changes seen within the bilateral hips. IMPRESSION: No acute osseous abnormality. Electronically Signed   By: Virgina Norfolk M.D.   On: 10/28/2019 21:05   CT HEAD WO CONTRAST  Result Date: 10/28/2019 CLINICAL DATA:  [address] downstairs, posterior scalp laceration, Parkinson's, dementia EXAM: CT HEAD WITHOUT CONTRAST CT CERVICAL SPINE WITHOUT CONTRAST TECHNIQUE: Multidetector CT imaging of the head and cervical spine was performed following the standard protocol without intravenous contrast. Multiplanar CT image reconstructions of the cervical spine were also generated. COMPARISON:  07/18/2019 FINDINGS: CT HEAD FINDINGS Brain: Confluent areas  of decreased attenuation throughout the periventricular white matter consistent with chronic small vessel ischemic changes, stable. No sign of acute infarct or hemorrhage. Lateral ventricles and remaining midline structures are unremarkable. No acute extra-axial fluid collections. No mass effect. Vascular: No hyperdense vessel or unexpected calcification. Skull: Normal. Negative for fracture or focal lesion. Sinuses/Orbits: Postsurgical changes bilateral maxillary sinuses. There is chronic mucosal thickening throughout the left maxillary sinus. Evidence of prior bilateral ethmoidectomies. Other: None. CT CERVICAL SPINE FINDINGS Alignment: Alignment is grossly anatomic. Skull base and vertebrae: No acute displaced fractures. Soft tissues and spinal canal: No prevertebral fluid or swelling. No visible canal hematoma. Disc levels: Mild multilevel spondylosis greatest at C4-5. There is diffuse facet hypertrophy. There is right predominant neural foraminal encroachment at the C4/C5 level. Upper chest: Airway is patent.  Lung apices are clear. Other: Reconstructed images demonstrate no additional findings. IMPRESSION: 1. No acute intracranial process. Stable chronic small-vessel ischemic changes throughout the white matter. 2. No acute cervical spine fracture. Electronically Signed   By: Randa Ngo M.D.   On: 10/28/2019 21:33   CT CHEST W CONTRAST  Result Date: 10/28/2019 CLINICAL DATA:  Acute pain due to trauma EXAM: CT CHEST, ABDOMEN, AND PELVIS WITH CONTRAST CT LUMBAR SPINE WITHOUT CONTRAST TECHNIQUE: Multidetector CT imaging of the chest, abdomen and pelvis was performed following the standard protocol during bolus administration of intravenous contrast. CONTRAST:  177mL OMNIPAQUE IOHEXOL 350 MG/ML SOLN COMPARISON:  CT L-spine dated November 14, 2009. CT of the pelvis dated November 15, 2013. FINDINGS: CT CHEST FINDINGS Cardiovascular: The heart size is normal. There are atherosclerotic changes of the thoracic aorta without evidence for dissection or aneurysm. There are coronary artery calcifications. There is no significant pericardial effusion. There is no large centrally located pulmonary embolism. Mediastinum/Nodes: -- No mediastinal lymphadenopathy. -- No hilar lymphadenopathy. -- No axillary lymphadenopathy. -- No supraclavicular lymphadenopathy. -- Normal thyroid gland where visualized. -  Unremarkable esophagus. Lungs/Pleura: There are trace bilateral pleural effusions. There is no pneumothorax. There is no focal infiltrate. The trachea is unremarkable. Musculoskeletal: There is an acute minimally displaced fracture of the rudimentary twelfth rib on the right. There may be a nondisplaced buckle fracture of the twelfth rib on the left.There is a nondisplaced fracture of the lateral seventh rib. There are advanced degenerative changes of the bilateral glenohumeral joints. CT ABDOMEN PELVIS FINDINGS Hepatobiliary: There is a cyst in the left hepatic lobe. Cholelithiasis without acute inflammation.There is no biliary ductal  dilation. Pancreas: Normal contours without ductal dilatation. No peripancreatic fluid collection. Spleen: Unremarkable. Adrenals/Urinary Tract: --Adrenal glands: Unremarkable. --Right kidney/ureter: No hydronephrosis or radiopaque kidney stones. --Left kidney/ureter: There is a nonobstructing stone in the upper pole the left kidney. --Urinary bladder: Unremarkable. Stomach/Bowel: --Stomach/Duodenum: No hiatal hernia or other gastric abnormality. Normal duodenal course and caliber. --Small bowel: Unremarkable. --Colon: There is a large amount of stool in the rectum. --Appendix: Not visualized. No right lower quadrant inflammation or free fluid. Vascular/Lymphatic: Atherosclerotic calcification is present within the non-aneurysmal abdominal aorta, without hemodynamically significant stenosis. --No retroperitoneal lymphadenopathy. --No mesenteric lymphadenopathy. --No pelvic or inguinal lymphadenopathy. Reproductive: The prostate gland is enlarged. Other: No ascites or free air. The abdominal wall is normal. Musculoskeletal. There are acute mildly displaced fractures of the right L1 through L4 transverse processes. Degenerative changes are noted throughout the lumbar spine. 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 There is mild anterolisthesis of L5 on S1. There are degenerative changes throughout the visualized lumbar spine, greatest at the  L1-L2 and L2-L3 levels. Facet arthrosis is noted. There is a probable nondisplaced fracture through the left sacral ala (axial series 4, image 71 on the lumbar reconstructions). IMPRESSION: 1. Acute mildly displaced fractures of the right L1 through L4 transverse processes. 2. There is a minimally displaced fracture of the posterior twelfth rib on the right. There is a nondisplaced fracture of the left lateral seventh rib. There is no pneumothorax. 3. Probable nondisplaced fracture through the left sacral ala. 4. Trace bilateral pleural effusions. 5. Cholelithiasis  without acute inflammation. 6. Nonobstructive left nephrolithiasis. 7. Prostatomegaly. 8. Large amount of stool in the rectum. Aortic Atherosclerosis (ICD10-I70.0). Electronically Signed   By: Constance Holster M.D.   On: 10/28/2019 21:38   CT CERVICAL SPINE WO CONTRAST  Result Date: 10/28/2019 CLINICAL DATA:  [address] downstairs, posterior scalp laceration, Parkinson's, dementia EXAM: CT HEAD WITHOUT CONTRAST CT CERVICAL SPINE WITHOUT CONTRAST TECHNIQUE: Multidetector CT imaging of the head and cervical spine was performed following the standard protocol without intravenous contrast. Multiplanar CT image reconstructions of the cervical spine were also generated. COMPARISON:  07/18/2019 FINDINGS: CT HEAD FINDINGS Brain: Confluent areas of decreased attenuation throughout the periventricular white matter consistent with chronic small vessel ischemic changes, stable. No sign of acute infarct or hemorrhage. Lateral ventricles and remaining midline structures are unremarkable. No acute extra-axial fluid collections. No mass effect. Vascular: No hyperdense vessel or unexpected calcification. Skull: Normal. Negative for fracture or focal lesion. Sinuses/Orbits: Postsurgical changes bilateral maxillary sinuses. There is chronic mucosal thickening throughout the left maxillary sinus. Evidence of prior bilateral ethmoidectomies. Other: None. CT CERVICAL SPINE FINDINGS Alignment: Alignment is grossly anatomic. Skull base and vertebrae: No acute displaced fractures. Soft tissues and spinal canal: No prevertebral fluid or swelling. No visible canal hematoma. Disc levels: Mild multilevel spondylosis greatest at C4-5. There is diffuse facet hypertrophy. There is right predominant neural foraminal encroachment at the C4/C5 level. Upper chest: Airway is patent. Lung apices are clear. Other: Reconstructed images demonstrate no additional findings. IMPRESSION: 1. No acute intracranial process. Stable chronic small-vessel ischemic  changes throughout the white matter. 2. No acute cervical spine fracture. Electronically Signed   By: Randa Ngo M.D.   On: 10/28/2019 21:33   CT ABDOMEN PELVIS W CONTRAST  Result Date: 10/28/2019 CLINICAL DATA:  Acute pain due to trauma EXAM: CT CHEST, ABDOMEN, AND PELVIS WITH CONTRAST CT LUMBAR SPINE WITHOUT CONTRAST TECHNIQUE: Multidetector CT imaging of the chest, abdomen and pelvis was performed following the standard protocol during bolus administration of intravenous contrast. CONTRAST:  121mL OMNIPAQUE IOHEXOL 350 MG/ML SOLN COMPARISON:  CT L-spine dated November 14, 2009. CT of the pelvis dated November 15, 2013. FINDINGS: CT CHEST FINDINGS Cardiovascular: The heart size is normal. There are atherosclerotic changes of the thoracic aorta without evidence for dissection or aneurysm. There are coronary artery calcifications. There is no significant pericardial effusion. There is no large centrally located pulmonary embolism. Mediastinum/Nodes: -- No mediastinal lymphadenopathy. -- No hilar lymphadenopathy. -- No axillary lymphadenopathy. -- No supraclavicular lymphadenopathy. -- Normal thyroid gland where visualized. -  Unremarkable esophagus. Lungs/Pleura: There are trace bilateral pleural effusions. There is no pneumothorax. There is no focal infiltrate. The trachea is unremarkable. Musculoskeletal: There is an acute minimally displaced fracture of the rudimentary twelfth rib on the right. There may be a nondisplaced buckle fracture of the twelfth rib on the left.There is a nondisplaced fracture of the lateral seventh rib. There are advanced degenerative changes of the bilateral glenohumeral joints. CT ABDOMEN PELVIS FINDINGS Hepatobiliary:  There is a cyst in the left hepatic lobe. Cholelithiasis without acute inflammation.There is no biliary ductal dilation. Pancreas: Normal contours without ductal dilatation. No peripancreatic fluid collection. Spleen: Unremarkable. Adrenals/Urinary Tract: --Adrenal  glands: Unremarkable. --Right kidney/ureter: No hydronephrosis or radiopaque kidney stones. --Left kidney/ureter: There is a nonobstructing stone in the upper pole the left kidney. --Urinary bladder: Unremarkable. Stomach/Bowel: --Stomach/Duodenum: No hiatal hernia or other gastric abnormality. Normal duodenal course and caliber. --Small bowel: Unremarkable. --Colon: There is a large amount of stool in the rectum. --Appendix: Not visualized. No right lower quadrant inflammation or free fluid. Vascular/Lymphatic: Atherosclerotic calcification is present within the non-aneurysmal abdominal aorta, without hemodynamically significant stenosis. --No retroperitoneal lymphadenopathy. --No mesenteric lymphadenopathy. --No pelvic or inguinal lymphadenopathy. Reproductive: The prostate gland is enlarged. Other: No ascites or free air. The abdominal wall is normal. Musculoskeletal. There are acute mildly displaced fractures of the right L1 through L4 transverse processes. Degenerative changes are noted throughout the lumbar spine. 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 There is mild anterolisthesis of L5 on S1. There are degenerative changes throughout the visualized lumbar spine, greatest at the L1-L2 and L2-L3 levels. Facet arthrosis is noted. There is a probable nondisplaced fracture through the left sacral ala (axial series 4, image 71 on the lumbar reconstructions). IMPRESSION: 1. Acute mildly displaced fractures of the right L1 through L4 transverse processes. 2. There is a minimally displaced fracture of the posterior twelfth rib on the right. There is a nondisplaced fracture of the left lateral seventh rib. There is no pneumothorax. 3. Probable nondisplaced fracture through the left sacral ala. 4. Trace bilateral pleural effusions. 5. Cholelithiasis without acute inflammation. 6. Nonobstructive left nephrolithiasis. 7. Prostatomegaly. 8. Large amount of stool in the rectum. Aortic Atherosclerosis  (ICD10-I70.0). Electronically Signed   By: Constance Holster M.D.   On: 10/28/2019 21:38   CT L-SPINE NO CHARGE  Result Date: 10/28/2019 CLINICAL DATA:  Acute pain due to trauma EXAM: CT CHEST, ABDOMEN, AND PELVIS WITH CONTRAST CT LUMBAR SPINE WITHOUT CONTRAST TECHNIQUE: Multidetector CT imaging of the chest, abdomen and pelvis was performed following the standard protocol during bolus administration of intravenous contrast. CONTRAST:  133mL OMNIPAQUE IOHEXOL 350 MG/ML SOLN COMPARISON:  CT L-spine dated November 14, 2009. CT of the pelvis dated November 15, 2013. FINDINGS: CT CHEST FINDINGS Cardiovascular: The heart size is normal. There are atherosclerotic changes of the thoracic aorta without evidence for dissection or aneurysm. There are coronary artery calcifications. There is no significant pericardial effusion. There is no large centrally located pulmonary embolism. Mediastinum/Nodes: -- No mediastinal lymphadenopathy. -- No hilar lymphadenopathy. -- No axillary lymphadenopathy. -- No supraclavicular lymphadenopathy. -- Normal thyroid gland where visualized. -  Unremarkable esophagus. Lungs/Pleura: There are trace bilateral pleural effusions. There is no pneumothorax. There is no focal infiltrate. The trachea is unremarkable. Musculoskeletal: There is an acute minimally displaced fracture of the rudimentary twelfth rib on the right. There may be a nondisplaced buckle fracture of the twelfth rib on the left.There is a nondisplaced fracture of the lateral seventh rib. There are advanced degenerative changes of the bilateral glenohumeral joints. CT ABDOMEN PELVIS FINDINGS Hepatobiliary: There is a cyst in the left hepatic lobe. Cholelithiasis without acute inflammation.There is no biliary ductal dilation. Pancreas: Normal contours without ductal dilatation. No peripancreatic fluid collection. Spleen: Unremarkable. Adrenals/Urinary Tract: --Adrenal glands: Unremarkable. --Right kidney/ureter: No hydronephrosis or  radiopaque kidney stones. --Left kidney/ureter: There is a nonobstructing stone in the upper pole the left kidney. --Urinary bladder: Unremarkable.  Stomach/Bowel: --Stomach/Duodenum: No hiatal hernia or other gastric abnormality. Normal duodenal course and caliber. --Small bowel: Unremarkable. --Colon: There is a large amount of stool in the rectum. --Appendix: Not visualized. No right lower quadrant inflammation or free fluid. Vascular/Lymphatic: Atherosclerotic calcification is present within the non-aneurysmal abdominal aorta, without hemodynamically significant stenosis. --No retroperitoneal lymphadenopathy. --No mesenteric lymphadenopathy. --No pelvic or inguinal lymphadenopathy. Reproductive: The prostate gland is enlarged. Other: No ascites or free air. The abdominal wall is normal. Musculoskeletal. There are acute mildly displaced fractures of the right L1 through L4 transverse processes. Degenerative changes are noted throughout the lumbar spine. 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 There is mild anterolisthesis of L5 on S1. There are degenerative changes throughout the visualized lumbar spine, greatest at the L1-L2 and L2-L3 levels. Facet arthrosis is noted. There is a probable nondisplaced fracture through the left sacral ala (axial series 4, image 71 on the lumbar reconstructions). IMPRESSION: 1. Acute mildly displaced fractures of the right L1 through L4 transverse processes. 2. There is a minimally displaced fracture of the posterior twelfth rib on the right. There is a nondisplaced fracture of the left lateral seventh rib. There is no pneumothorax. 3. Probable nondisplaced fracture through the left sacral ala. 4. Trace bilateral pleural effusions. 5. Cholelithiasis without acute inflammation. 6. Nonobstructive left nephrolithiasis. 7. Prostatomegaly. 8. Large amount of stool in the rectum. Aortic Atherosclerosis (ICD10-I70.0). Electronically Signed   By: Constance Holster M.D.    On: 10/28/2019 21:38   DG Knee Complete 4 Views Left  Result Date: 10/28/2019 CLINICAL DATA:  Status post fall. EXAM: LEFT KNEE - COMPLETE 4+ VIEW COMPARISON:  None. FINDINGS: No evidence of acute fracture or dislocation. Mild medial and lateral tibiofemoral compartment space narrowing is seen. Moderate to marked severity vascular calcification is noted. A very small joint effusion is present. IMPRESSION: 1. No evidence of acute fracture or dislocation. 2. Very small joint effusion. Electronically Signed   By: Virgina Norfolk M.D.   On: 10/28/2019 21:04   DG FEMUR MIN 2 VIEWS LEFT  Result Date: 10/28/2019 CLINICAL DATA:  Status post fall. EXAM: LEFT FEMUR 2 VIEWS COMPARISON:  None. FINDINGS: There is no evidence of fracture or other focal bone lesions. Moderate severity vascular calcification is seen. IMPRESSION: No acute osseous abnormality. Electronically Signed   By: Virgina Norfolk M.D.   On: 10/28/2019 21:01   DG FEMUR, MIN 2 VIEWS RIGHT  Result Date: 10/28/2019 CLINICAL DATA:  Status post fall. EXAM: RIGHT FEMUR 2 VIEWS COMPARISON:  None. FINDINGS: There is no evidence of fracture or other focal bone lesions. There is moderate to marked severity vascular calcification. IMPRESSION: 1. No acute osseous abnormality. Electronically Signed   By: Virgina Norfolk M.D.   On: 10/28/2019 21:02    Review of Systems - Negative except As per HPI    Blood pressure (!) 152/91, pulse 91, temperature (!) 97.5 F (36.4 C), temperature source Oral, resp. rate 20, SpO2 100 %. Physical Exam  Assessment/Plan: Patient has multiple right lumbar transverse process fractures ( L 1 - L 4).  He also has a questionable left sacral ala fracture.  He has dementia, Parkinson's and degenerative lumbar scoliosis as well as 12th rib fracture.  Transverse process fractures are painful due to their muscular attachments and generally patients feel better with bracing, but this is for comfort, rather than required for  structural support.  If the patient is uncomfortable in a brace or is confused and prefers not to  have it on, he does not need to wear it.  He can mobilize as tolerated in the hospital and follow up with me in the office as an outpatient.  Peggyann Shoals, MD 10/29/2019, 8:27 AM

## 2019-10-29 NOTE — ED Notes (Addendum)
This nurse to pt room to give medication regimen as ordered, pt daughter at bedside. Informs nurse pt normally take medication fine with water. This nurse to administer medication, pt did not tolerate well- this nurse notified attending MD of above- ordered to keep NPO for now

## 2019-10-29 NOTE — ED Notes (Signed)
Lunch tray ordered placed  

## 2019-10-29 NOTE — Progress Notes (Signed)
Orthopedic Tech Progress Note Patient Details:  [name] 08-Aug-1933 [id number]  Patient ID: [name], male   DOB: [birth year], 86 y.o.   MRN: [id number] Pt removed brace I reapplied it.  Karolee Stamps 10/29/2019, 4:26 AM

## 2019-10-29 NOTE — Progress Notes (Signed)
Orthopedic Tech Progress Note Patient Details:  [name] Aug 06, 1933 [id number]  Patient ID: [name], male   DOB: [birth year], 86 y.o.   MRN: [id number] Applied tlso  Karolee Stamps 10/29/2019, 12:50 AM

## 2019-10-29 NOTE — Progress Notes (Signed)
PROGRESS NOTE  [name]:[id number] DOB: Jun 03, 1933 DOA: 10/28/2019 PCP: Colon Branch, MD  HPI/Recap of past 24 hours: HPI from Dr [name] is a 86 y.o. male with medical history significant of Parkinson's Disease, overactive bladder, osteoarthritis, depression, HTN, HLD, GERD, anxiety, BPH, h/o bladder cancer, DDD who presents after a fall.  Mr. [name] has been having a decline in function in the last few months with unsteadiness on his feet due to parkinson's disease as well as worsening dementia. Prior to fall, family thought he was in the restroom, however, he did take it upon himself to walk up the stairs.  He lost his footing and fell down about 12 steps. During the fall, he was apparently conscious and speaking to himself per his wife.  He is unsure if he lost consciousness, but feels that he just slipped.  He does have a history of recurrent syncope and has been seeing cardiology with work up including a TTE (08/16/19-mild stenosis of the mitral valve, Normal EF, indeterminate diastolic parameters), 30 day monitor (no events) and the plan was to proceed with a loop recorder.  The visit to EP has not occurred as of yet. In the ED, he was found to have acute mildly displaced fractures of the right L1-L4 transverse processes and a back brace was placed.  Further found to have minimally displaced fracture of the twelfth rib on the right and a non displaced fracture of the seventh lateral rib with probable non displaced fracture through the left sacral ala. Neurosurgery and trauma was consulted.  Patient admitted for further management.    Today, patient was awake, alert, but disoriented.  Reports pain especially around his sacral area.  Denies any chest pain, shortness of breath, abdominal pain, nausea/vomiting, fever/chills.  Assessment/Plan: Active Problems:   Hyperlipidemia   Depression   Essential hypertension   Osteoarthritis   Dementia due to Parkinson's disease without  behavioral disturbance (HCC)   PD (Parkinson's disease) (HCC)   BPH (benign prostatic hyperplasia)   OAB (overactive bladder)   Syncope and collapse   Likely mechanical fall with multiple fractures Acute mildly displaced fractures of the right L1-L4 transverse processes Minimally displaced fracture of the twelfth rib on the right and a non displaced fracture of the seventh lateral rib Non displaced fracture through the left sacral ala CT head/cervical spine with no acute abnormality Trauma surgery consulted, pain management Neurosurgery consulted, recommend back brace for comfort, mobilize as tolerated, follow-up as an outpatient  Pain management, incentive spirometry for rib fractures Fall precautions PT/OT  History of recurrent syncope EKG with sinus rhythm with PVCs Recent work up including a TTE (08/16/19-mild stenosis of the mitral valve, Normal EF, indeterminate diastolic parameters), 30 day monitor (no events) and the plan was to proceed with a loop recorder Follow-up with cardiology as an outpatient for possible loop recorder placement Telemetry  Leukocytosis Likely reactive, currently afebrile UA negative for infection CT chest with no pneumonia Daily CBC  Dementia/Parkinson's disease Delirium precautions Continue Sinemet Fall precautions  BPH/overactive bladder Continue Myrbetriq, tamsulosin  Constipation Give enema once Bowel regimen while on narcotics  Depression Continue sertraline, Xanax        Malnutrition Type:      Malnutrition Characteristics:      Nutrition Interventions:       Estimated body mass index is 19.71 kg/m as calculated from the following:   Height as of 10/16/19: 5\' 9"  (1.753 m).   Weight as of 10/16/19:  60.6 kg.     Code Status: DNR  Family Communication: Plan to discuss with family  Disposition Plan: Status is: Inpatient  Remains inpatient appropriate because:Inpatient level of care appropriate due to severity  of illness   Dispo: The patient is from: Home              Anticipated d/c is to: TBD              Anticipated d/c date is: 2 days              Patient currently is not medically stable to d/c.    Consultants:  Trauma surgery  Neurosurgery  Procedures:  None  Antimicrobials:  None  DVT prophylaxis: lovenox   Objective: Vitals:   10/29/19 0403 10/29/19 0600 10/29/19 0622 10/29/19 0800  BP: (!) 147/74 (!) 152/91 (!) 152/91 (!) 154/89  Pulse: 85 (!) 46 91 87  Resp: (!) 23  20 18   Temp:      TempSrc:      SpO2: 100% 100% 100% 100%   No intake or output data in the 24 hours ending 10/29/19 1308 There were no vitals filed for this visit.  Exam:  General: NAD, alert, awake, disoriented, noted to be in brace  Cardiovascular: S1, S2 present  Respiratory: CTAB  Abdomen: Soft, nontender, nondistended, bowel sounds present  Musculoskeletal: No bilateral pedal edema noted  Skin: Normal  Psychiatry: Normal mood    Data Reviewed: CBC: Recent Labs  Lab 10/28/19 1945 10/28/19 2037 10/29/19 1025  WBC 14.2*  --  13.8*  NEUTROABS 12.1*  --   --   HGB 13.1 12.9* 12.6*  HCT 40.3 38.0* 38.8*  MCV 97.3  --  96.0  PLT 209  --  527   Basic Metabolic Panel: Recent Labs  Lab 10/28/19 1945 10/28/19 2037  NA 141 141  K 4.3 3.7  CL 106 102  CO2 28  --   GLUCOSE 106* 100*  BUN 19 20  CREATININE 0.99 0.90  CALCIUM 9.1  --    GFR: Estimated Creatinine Clearance: 53.3 mL/min (by C-G formula based on SCr of 0.9 mg/dL). Liver Function Tests: Recent Labs  Lab 10/28/19 1945  AST 17  ALT 6  ALKPHOS 94  BILITOT 1.4*  PROT 6.3*  ALBUMIN 3.9   No results for input(s): LIPASE, AMYLASE in the last 168 hours. No results for input(s): AMMONIA in the last 168 hours. Coagulation Profile: Recent Labs  Lab 10/29/19 1025  INR 1.2   Cardiac Enzymes: No results for input(s): CKTOTAL, CKMB, CKMBINDEX, TROPONINI in the last 168 hours. BNP (last 3 results) No  results for input(s): PROBNP in the last 8760 hours. HbA1C: No results for input(s): HGBA1C in the last 72 hours. CBG: Recent Labs  Lab 10/28/19 2142  GLUCAP 81   Lipid Profile: No results for input(s): CHOL, HDL, LDLCALC, TRIG, CHOLHDL, LDLDIRECT in the last 72 hours. Thyroid Function Tests: No results for input(s): TSH, T4TOTAL, FREET4, T3FREE, THYROIDAB in the last 72 hours. Anemia Panel: No results for input(s): VITAMINB12, FOLATE, FERRITIN, TIBC, IRON, RETICCTPCT in the last 72 hours. Urine analysis:    Component Value Date/Time   COLORURINE YELLOW 10/28/2019 2318   APPEARANCEUR CLEAR 10/28/2019 2318   LABSPEC 1.038 (H) 10/28/2019 2318   PHURINE 5.0 10/28/2019 2318   GLUCOSEU NEGATIVE 10/28/2019 2318   GLUCOSEU NEGATIVE 08/23/2019 1017   HGBUR SMALL (A) 10/28/2019 2318   HGBUR negative 05/22/2009 0911   BILIRUBINUR NEGATIVE 10/28/2019 2318  BILIRUBINUR neg 03/18/2011 0903   KETONESUR 5 (A) 10/28/2019 2318   PROTEINUR NEGATIVE 10/28/2019 2318   UROBILINOGEN 0.2 08/23/2019 1017   NITRITE NEGATIVE 10/28/2019 2318   LEUKOCYTESUR NEGATIVE 10/28/2019 2318   Sepsis Labs: @LABRCNTIP (procalcitonin:4,lacticidven:4)  ) Recent Results (from the past 240 hour(s))  SARS Coronavirus 2 by RT PCR (hospital order, performed in [facility] hospital lab) Nasopharyngeal Nasopharyngeal Swab     Status: None   Collection Time: 10/28/19 10:39 PM   Specimen: Nasopharyngeal Swab  Result Value Ref Range Status   SARS Coronavirus 2 NEGATIVE NEGATIVE Final    Comment: (NOTE) SARS-CoV-2 target nucleic acids are NOT DETECTED.  The SARS-CoV-2 RNA is generally detectable in upper and lower respiratory specimens during the acute phase of infection. The lowest concentration of SARS-CoV-2 viral copies this assay can detect is 250 copies / mL. A negative result does not preclude SARS-CoV-2 infection and should not be used as the sole basis for treatment or other patient management decisions.  A  negative result may occur with improper specimen collection / handling, submission of specimen other than nasopharyngeal swab, presence of viral mutation(s) within the areas targeted by this assay, and inadequate number of viral copies (<250 copies / mL). A negative result must be combined with clinical observations, patient history, and epidemiological information.  Fact Sheet for Patients:   StrictlyIdeas.no  Fact Sheet for Healthcare Providers: BankingDealers.co.za  This test is not yet approved or  cleared by the [place] FDA and has been authorized for detection and/or diagnosis of SARS-CoV-2 by FDA under an Emergency Use Authorization (EUA).  This EUA will remain in effect (meaning this test can be used) for the duration of the COVID-19 declaration under Section 564(b)(1) of the Act, 21 U.S.C. section 360bbb-3(b)(1), unless the authorization is terminated or revoked sooner.  Performed at [facility], [place] [address], [place], [place] [address]       Studies: DG Chest 1 View  Result Date: 10/28/2019 CLINICAL DATA:  Status post fall. EXAM: CHEST  1 VIEW COMPARISON:  February 01, 2017 FINDINGS: There is no evidence of acute infiltrate, pleural effusion or pneumothorax. The heart size and mediastinal contours are within normal limits. There is moderate severity calcification of the aortic arch. An acute fracture of the seventh left rib is seen. IMPRESSION: Acute fracture of the seventh left rib. Electronically Signed   By: Virgina Norfolk M.D.   On: 10/28/2019 20:59   DG Pelvis 1-2 Views  Result Date: 10/28/2019 CLINICAL DATA:  Status post fall. EXAM: PELVIS - 1-2 VIEW COMPARISON:  None. FINDINGS: There is no evidence of pelvic fracture or diastasis. No pelvic bone lesions are seen. Mild degenerative changes seen within the bilateral hips. IMPRESSION: No acute osseous abnormality. Electronically Signed   By: Virgina Norfolk M.D.   On: 10/28/2019 21:05   CT HEAD WO CONTRAST  Result Date: 10/28/2019 CLINICAL DATA:  [address] downstairs, posterior scalp laceration, Parkinson's, dementia EXAM: CT HEAD WITHOUT CONTRAST CT CERVICAL SPINE WITHOUT CONTRAST TECHNIQUE: Multidetector CT imaging of the head and cervical spine was performed following the standard protocol without intravenous contrast. Multiplanar CT image reconstructions of the cervical spine were also generated. COMPARISON:  07/18/2019 FINDINGS: CT HEAD FINDINGS Brain: Confluent areas of decreased attenuation throughout the periventricular white matter consistent with chronic small vessel ischemic changes, stable. No sign of acute infarct or hemorrhage. Lateral ventricles and remaining midline structures are unremarkable. No acute extra-axial fluid collections. No mass effect. Vascular: No hyperdense vessel  or unexpected calcification. Skull: Normal. Negative for fracture or focal lesion. Sinuses/Orbits: Postsurgical changes bilateral maxillary sinuses. There is chronic mucosal thickening throughout the left maxillary sinus. Evidence of prior bilateral ethmoidectomies. Other: None. CT CERVICAL SPINE FINDINGS Alignment: Alignment is grossly anatomic. Skull base and vertebrae: No acute displaced fractures. Soft tissues and spinal canal: No prevertebral fluid or swelling. No visible canal hematoma. Disc levels: Mild multilevel spondylosis greatest at C4-5. There is diffuse facet hypertrophy. There is right predominant neural foraminal encroachment at the C4/C5 level. Upper chest: Airway is patent. Lung apices are clear. Other: Reconstructed images demonstrate no additional findings. IMPRESSION: 1. No acute intracranial process. Stable chronic small-vessel ischemic changes throughout the white matter. 2. No acute cervical spine fracture. Electronically Signed   By: Randa Ngo M.D.   On: 10/28/2019 21:33   CT CHEST W CONTRAST  Result Date: 10/28/2019 CLINICAL DATA:   Acute pain due to trauma EXAM: CT CHEST, ABDOMEN, AND PELVIS WITH CONTRAST CT LUMBAR SPINE WITHOUT CONTRAST TECHNIQUE: Multidetector CT imaging of the chest, abdomen and pelvis was performed following the standard protocol during bolus administration of intravenous contrast. CONTRAST:  114mL OMNIPAQUE IOHEXOL 350 MG/ML SOLN COMPARISON:  CT L-spine dated November 14, 2009. CT of the pelvis dated November 15, 2013. FINDINGS: CT CHEST FINDINGS Cardiovascular: The heart size is normal. There are atherosclerotic changes of the thoracic aorta without evidence for dissection or aneurysm. There are coronary artery calcifications. There is no significant pericardial effusion. There is no large centrally located pulmonary embolism. Mediastinum/Nodes: -- No mediastinal lymphadenopathy. -- No hilar lymphadenopathy. -- No axillary lymphadenopathy. -- No supraclavicular lymphadenopathy. -- Normal thyroid gland where visualized. -  Unremarkable esophagus. Lungs/Pleura: There are trace bilateral pleural effusions. There is no pneumothorax. There is no focal infiltrate. The trachea is unremarkable. Musculoskeletal: There is an acute minimally displaced fracture of the rudimentary twelfth rib on the right. There may be a nondisplaced buckle fracture of the twelfth rib on the left.There is a nondisplaced fracture of the lateral seventh rib. There are advanced degenerative changes of the bilateral glenohumeral joints. CT ABDOMEN PELVIS FINDINGS Hepatobiliary: There is a cyst in the left hepatic lobe. Cholelithiasis without acute inflammation.There is no biliary ductal dilation. Pancreas: Normal contours without ductal dilatation. No peripancreatic fluid collection. Spleen: Unremarkable. Adrenals/Urinary Tract: --Adrenal glands: Unremarkable. --Right kidney/ureter: No hydronephrosis or radiopaque kidney stones. --Left kidney/ureter: There is a nonobstructing stone in the upper pole the left kidney. --Urinary bladder: Unremarkable.  Stomach/Bowel: --Stomach/Duodenum: No hiatal hernia or other gastric abnormality. Normal duodenal course and caliber. --Small bowel: Unremarkable. --Colon: There is a large amount of stool in the rectum. --Appendix: Not visualized. No right lower quadrant inflammation or free fluid. Vascular/Lymphatic: Atherosclerotic calcification is present within the non-aneurysmal abdominal aorta, without hemodynamically significant stenosis. --No retroperitoneal lymphadenopathy. --No mesenteric lymphadenopathy. --No pelvic or inguinal lymphadenopathy. Reproductive: The prostate gland is enlarged. Other: No ascites or free air. The abdominal wall is normal. Musculoskeletal. There are acute mildly displaced fractures of the right L1 through L4 transverse processes. Degenerative changes are noted throughout the lumbar spine. 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 There is mild anterolisthesis of L5 on S1. There are degenerative changes throughout the visualized lumbar spine, greatest at the L1-L2 and L2-L3 levels. Facet arthrosis is noted. There is a probable nondisplaced fracture through the left sacral ala (axial series 4, image 71 on the lumbar reconstructions). IMPRESSION: 1. Acute mildly displaced fractures of the right L1 through L4 transverse processes. 2.  There is a minimally displaced fracture of the posterior twelfth rib on the right. There is a nondisplaced fracture of the left lateral seventh rib. There is no pneumothorax. 3. Probable nondisplaced fracture through the left sacral ala. 4. Trace bilateral pleural effusions. 5. Cholelithiasis without acute inflammation. 6. Nonobstructive left nephrolithiasis. 7. Prostatomegaly. 8. Large amount of stool in the rectum. Aortic Atherosclerosis (ICD10-I70.0). Electronically Signed   By: Constance Holster M.D.   On: 10/28/2019 21:38   CT CERVICAL SPINE WO CONTRAST  Result Date: 10/28/2019 CLINICAL DATA:  [address] downstairs, posterior scalp laceration,  Parkinson's, dementia EXAM: CT HEAD WITHOUT CONTRAST CT CERVICAL SPINE WITHOUT CONTRAST TECHNIQUE: Multidetector CT imaging of the head and cervical spine was performed following the standard protocol without intravenous contrast. Multiplanar CT image reconstructions of the cervical spine were also generated. COMPARISON:  07/18/2019 FINDINGS: CT HEAD FINDINGS Brain: Confluent areas of decreased attenuation throughout the periventricular white matter consistent with chronic small vessel ischemic changes, stable. No sign of acute infarct or hemorrhage. Lateral ventricles and remaining midline structures are unremarkable. No acute extra-axial fluid collections. No mass effect. Vascular: No hyperdense vessel or unexpected calcification. Skull: Normal. Negative for fracture or focal lesion. Sinuses/Orbits: Postsurgical changes bilateral maxillary sinuses. There is chronic mucosal thickening throughout the left maxillary sinus. Evidence of prior bilateral ethmoidectomies. Other: None. CT CERVICAL SPINE FINDINGS Alignment: Alignment is grossly anatomic. Skull base and vertebrae: No acute displaced fractures. Soft tissues and spinal canal: No prevertebral fluid or swelling. No visible canal hematoma. Disc levels: Mild multilevel spondylosis greatest at C4-5. There is diffuse facet hypertrophy. There is right predominant neural foraminal encroachment at the C4/C5 level. Upper chest: Airway is patent. Lung apices are clear. Other: Reconstructed images demonstrate no additional findings. IMPRESSION: 1. No acute intracranial process. Stable chronic small-vessel ischemic changes throughout the white matter. 2. No acute cervical spine fracture. Electronically Signed   By: Randa Ngo M.D.   On: 10/28/2019 21:33   CT ABDOMEN PELVIS W CONTRAST  Result Date: 10/28/2019 CLINICAL DATA:  Acute pain due to trauma EXAM: CT CHEST, ABDOMEN, AND PELVIS WITH CONTRAST CT LUMBAR SPINE WITHOUT CONTRAST TECHNIQUE: Multidetector CT imaging  of the chest, abdomen and pelvis was performed following the standard protocol during bolus administration of intravenous contrast. CONTRAST:  111mL OMNIPAQUE IOHEXOL 350 MG/ML SOLN COMPARISON:  CT L-spine dated November 14, 2009. CT of the pelvis dated November 15, 2013. FINDINGS: CT CHEST FINDINGS Cardiovascular: The heart size is normal. There are atherosclerotic changes of the thoracic aorta without evidence for dissection or aneurysm. There are coronary artery calcifications. There is no significant pericardial effusion. There is no large centrally located pulmonary embolism. Mediastinum/Nodes: -- No mediastinal lymphadenopathy. -- No hilar lymphadenopathy. -- No axillary lymphadenopathy. -- No supraclavicular lymphadenopathy. -- Normal thyroid gland where visualized. -  Unremarkable esophagus. Lungs/Pleura: There are trace bilateral pleural effusions. There is no pneumothorax. There is no focal infiltrate. The trachea is unremarkable. Musculoskeletal: There is an acute minimally displaced fracture of the rudimentary twelfth rib on the right. There may be a nondisplaced buckle fracture of the twelfth rib on the left.There is a nondisplaced fracture of the lateral seventh rib. There are advanced degenerative changes of the bilateral glenohumeral joints. CT ABDOMEN PELVIS FINDINGS Hepatobiliary: There is a cyst in the left hepatic lobe. Cholelithiasis without acute inflammation.There is no biliary ductal dilation. Pancreas: Normal contours without ductal dilatation. No peripancreatic fluid collection. Spleen: Unremarkable. Adrenals/Urinary Tract: --Adrenal glands: Unremarkable. --Right kidney/ureter: No hydronephrosis or radiopaque kidney stones. --  Left kidney/ureter: There is a nonobstructing stone in the upper pole the left kidney. --Urinary bladder: Unremarkable. Stomach/Bowel: --Stomach/Duodenum: No hiatal hernia or other gastric abnormality. Normal duodenal course and caliber. --Small bowel: Unremarkable. --Colon:  There is a large amount of stool in the rectum. --Appendix: Not visualized. No right lower quadrant inflammation or free fluid. Vascular/Lymphatic: Atherosclerotic calcification is present within the non-aneurysmal abdominal aorta, without hemodynamically significant stenosis. --No retroperitoneal lymphadenopathy. --No mesenteric lymphadenopathy. --No pelvic or inguinal lymphadenopathy. Reproductive: The prostate gland is enlarged. Other: No ascites or free air. The abdominal wall is normal. Musculoskeletal. There are acute mildly displaced fractures of the right L1 through L4 transverse processes. Degenerative changes are noted throughout the lumbar spine. 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 There is mild anterolisthesis of L5 on S1. There are degenerative changes throughout the visualized lumbar spine, greatest at the L1-L2 and L2-L3 levels. Facet arthrosis is noted. There is a probable nondisplaced fracture through the left sacral ala (axial series 4, image 71 on the lumbar reconstructions). IMPRESSION: 1. Acute mildly displaced fractures of the right L1 through L4 transverse processes. 2. There is a minimally displaced fracture of the posterior twelfth rib on the right. There is a nondisplaced fracture of the left lateral seventh rib. There is no pneumothorax. 3. Probable nondisplaced fracture through the left sacral ala. 4. Trace bilateral pleural effusions. 5. Cholelithiasis without acute inflammation. 6. Nonobstructive left nephrolithiasis. 7. Prostatomegaly. 8. Large amount of stool in the rectum. Aortic Atherosclerosis (ICD10-I70.0). Electronically Signed   By: Constance Holster M.D.   On: 10/28/2019 21:38   CT L-SPINE NO CHARGE  Result Date: 10/28/2019 CLINICAL DATA:  Acute pain due to trauma EXAM: CT CHEST, ABDOMEN, AND PELVIS WITH CONTRAST CT LUMBAR SPINE WITHOUT CONTRAST TECHNIQUE: Multidetector CT imaging of the chest, abdomen and pelvis was performed following the standard  protocol during bolus administration of intravenous contrast. CONTRAST:  167mL OMNIPAQUE IOHEXOL 350 MG/ML SOLN COMPARISON:  CT L-spine dated November 14, 2009. CT of the pelvis dated November 15, 2013. FINDINGS: CT CHEST FINDINGS Cardiovascular: The heart size is normal. There are atherosclerotic changes of the thoracic aorta without evidence for dissection or aneurysm. There are coronary artery calcifications. There is no significant pericardial effusion. There is no large centrally located pulmonary embolism. Mediastinum/Nodes: -- No mediastinal lymphadenopathy. -- No hilar lymphadenopathy. -- No axillary lymphadenopathy. -- No supraclavicular lymphadenopathy. -- Normal thyroid gland where visualized. -  Unremarkable esophagus. Lungs/Pleura: There are trace bilateral pleural effusions. There is no pneumothorax. There is no focal infiltrate. The trachea is unremarkable. Musculoskeletal: There is an acute minimally displaced fracture of the rudimentary twelfth rib on the right. There may be a nondisplaced buckle fracture of the twelfth rib on the left.There is a nondisplaced fracture of the lateral seventh rib. There are advanced degenerative changes of the bilateral glenohumeral joints. CT ABDOMEN PELVIS FINDINGS Hepatobiliary: There is a cyst in the left hepatic lobe. Cholelithiasis without acute inflammation.There is no biliary ductal dilation. Pancreas: Normal contours without ductal dilatation. No peripancreatic fluid collection. Spleen: Unremarkable. Adrenals/Urinary Tract: --Adrenal glands: Unremarkable. --Right kidney/ureter: No hydronephrosis or radiopaque kidney stones. --Left kidney/ureter: There is a nonobstructing stone in the upper pole the left kidney. --Urinary bladder: Unremarkable. Stomach/Bowel: --Stomach/Duodenum: No hiatal hernia or other gastric abnormality. Normal duodenal course and caliber. --Small bowel: Unremarkable. --Colon: There is a large amount of stool in the rectum. --Appendix: Not  visualized. No right lower quadrant inflammation or free fluid. Vascular/Lymphatic: Atherosclerotic calcification is  present within the non-aneurysmal abdominal aorta, without hemodynamically significant stenosis. --No retroperitoneal lymphadenopathy. --No mesenteric lymphadenopathy. --No pelvic or inguinal lymphadenopathy. Reproductive: The prostate gland is enlarged. Other: No ascites or free air. The abdominal wall is normal. Musculoskeletal. There are acute mildly displaced fractures of the right L1 through L4 transverse processes. Degenerative changes are noted throughout the lumbar spine. 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 There is mild anterolisthesis of L5 on S1. There are degenerative changes throughout the visualized lumbar spine, greatest at the L1-L2 and L2-L3 levels. Facet arthrosis is noted. There is a probable nondisplaced fracture through the left sacral ala (axial series 4, image 71 on the lumbar reconstructions). IMPRESSION: 1. Acute mildly displaced fractures of the right L1 through L4 transverse processes. 2. There is a minimally displaced fracture of the posterior twelfth rib on the right. There is a nondisplaced fracture of the left lateral seventh rib. There is no pneumothorax. 3. Probable nondisplaced fracture through the left sacral ala. 4. Trace bilateral pleural effusions. 5. Cholelithiasis without acute inflammation. 6. Nonobstructive left nephrolithiasis. 7. Prostatomegaly. 8. Large amount of stool in the rectum. Aortic Atherosclerosis (ICD10-I70.0). Electronically Signed   By: Constance Holster M.D.   On: 10/28/2019 21:38   DG Knee Complete 4 Views Left  Result Date: 10/28/2019 CLINICAL DATA:  Status post fall. EXAM: LEFT KNEE - COMPLETE 4+ VIEW COMPARISON:  None. FINDINGS: No evidence of acute fracture or dislocation. Mild medial and lateral tibiofemoral compartment space narrowing is seen. Moderate to marked severity vascular calcification is noted. A very  small joint effusion is present. IMPRESSION: 1. No evidence of acute fracture or dislocation. 2. Very small joint effusion. Electronically Signed   By: Virgina Norfolk M.D.   On: 10/28/2019 21:04   DG FEMUR MIN 2 VIEWS LEFT  Result Date: 10/28/2019 CLINICAL DATA:  Status post fall. EXAM: LEFT FEMUR 2 VIEWS COMPARISON:  None. FINDINGS: There is no evidence of fracture or other focal bone lesions. Moderate severity vascular calcification is seen. IMPRESSION: No acute osseous abnormality. Electronically Signed   By: Virgina Norfolk M.D.   On: 10/28/2019 21:01   DG FEMUR, MIN 2 VIEWS RIGHT  Result Date: 10/28/2019 CLINICAL DATA:  Status post fall. EXAM: RIGHT FEMUR 2 VIEWS COMPARISON:  None. FINDINGS: There is no evidence of fracture or other focal bone lesions. There is moderate to marked severity vascular calcification. IMPRESSION: 1. No acute osseous abnormality. Electronically Signed   By: Virgina Norfolk M.D.   On: 10/28/2019 21:02    Scheduled Meds: . carbidopa-levodopa  2 tablet Oral QID  . mirabegron ER  50 mg Oral q1600  . sertraline  150 mg Oral Daily  . tamsulosin  0.4 mg Oral QPC supper    Continuous Infusions:   LOS: 0 days     Alma Friendly, MD [facility]  If 7PM-7AM, please contact night-coverage [email or web address] 10/29/2019, 1:08 PM

## 2019-10-30 ENCOUNTER — Inpatient Hospital Stay (HOSPITAL_COMMUNITY): Payer: Medicare Other

## 2019-10-30 DIAGNOSIS — R55 Syncope and collapse: Secondary | ICD-10-CM | POA: Diagnosis not present

## 2019-10-30 DIAGNOSIS — N3281 Overactive bladder: Secondary | ICD-10-CM | POA: Diagnosis not present

## 2019-10-30 DIAGNOSIS — F329 Major depressive disorder, single episode, unspecified: Secondary | ICD-10-CM | POA: Diagnosis not present

## 2019-10-30 DIAGNOSIS — G20 Parkinson's disease: Secondary | ICD-10-CM | POA: Diagnosis not present

## 2019-10-30 LAB — CBC WITH DIFFERENTIAL/PLATELET
Abs Immature Granulocytes: 0.05 10*3/uL (ref 0.00–0.07)
Basophils Absolute: 0.1 10*3/uL (ref 0.0–0.1)
Basophils Relative: 1 %
Eosinophils Absolute: 0.1 10*3/uL (ref 0.0–0.5)
Eosinophils Relative: 1 %
HCT: 33.5 % — ABNORMAL LOW (ref 39.0–52.0)
Hemoglobin: 10.8 g/dL — ABNORMAL LOW (ref 13.0–17.0)
Immature Granulocytes: 1 %
Lymphocytes Relative: 8 %
Lymphs Abs: 0.8 10*3/uL (ref 0.7–4.0)
MCH: 31 pg (ref 26.0–34.0)
MCHC: 32.2 g/dL (ref 30.0–36.0)
MCV: 96.3 fL (ref 80.0–100.0)
Monocytes Absolute: 0.9 10*3/uL (ref 0.1–1.0)
Monocytes Relative: 10 %
Neutro Abs: 7.6 10*3/uL (ref 1.7–7.7)
Neutrophils Relative %: 79 %
Platelets: 179 10*3/uL (ref 150–400)
RBC: 3.48 MIL/uL — ABNORMAL LOW (ref 4.22–5.81)
RDW: 13.2 % (ref 11.5–15.5)
WBC: 9.5 10*3/uL (ref 4.0–10.5)
nRBC: 0 % (ref 0.0–0.2)

## 2019-10-30 LAB — BASIC METABOLIC PANEL
Anion gap: 8 (ref 5–15)
BUN: 21 mg/dL (ref 8–23)
CO2: 25 mmol/L (ref 22–32)
Calcium: 8.8 mg/dL — ABNORMAL LOW (ref 8.9–10.3)
Chloride: 106 mmol/L (ref 98–111)
Creatinine, Ser: 1.12 mg/dL (ref 0.61–1.24)
GFR calc Af Amer: >60 mL/min (ref >60)
GFR calc non Af Amer: >60 mL/min (ref >60)
Glucose, Bld: 127 mg/dL — ABNORMAL HIGH (ref 70–99)
Potassium: 4 mmol/L (ref 3.5–5.1)
Sodium: 139 mmol/L (ref 135–145)

## 2019-10-30 LAB — GLUCOSE, CAPILLARY: Glucose-Capillary: 101 mg/dL — ABNORMAL HIGH (ref 70–99)

## 2019-10-30 NOTE — Evaluation (Signed)
Occupational Therapy Evaluation Patient Details Name: [name] MRN: [id number] DOB: 05-08-1933 Today's Date: 10/30/2019    History of Present Illness [name] is an 86 yo male presenting after a fall down ~12 steps. Upon workup, the pt was found to have acute mildly displaced fx of L1-L4 transverse processes and R 12th rib as well as non-displaced fx of the 7th rib and L sacral ala. PMH includes: recurrent syncope, Parkinson's Disease, dementia, overactive bladder, HTN, HLD, GERD, BPH, bladder cancer, and osteoarthritis.    Clinical Impression   PTA pt reports needing some assist with bathing and dressing and utilizing RW or cane for mobility. Pt was admitted for above and treated for problem list below (see OT Problem List). Pt was alert and oriented to self and situation. Limited eval due to orthostatic BP - pt denied feeling dizzy, but reported he had a hard time focusing on objects he was looking at. Requires multimodal cues for safety and sequencing with slow initiation and increased time for processing. Requires Mod A - Total A +2 for ADLs due to generalized weakness, pain, and decreased cognition. Requires Max A +2 with transfers and bed mobility with verbal cues for sequencing and safety. Reports his wife is home 24/7 and able to assist in ADLs. Believe pt would benefit from skilled OT services acutely and at the SNF level to increase safety and return to PLOF.   Vitals: Supine: 149/77 (95) HR 85 Initial Sitting: 121/85 (96) HR 91 Standing: 141/107 HR 94 Sitting after activity: 125/72 (86) HR 91    Follow Up Recommendations  SNF;Supervision/Assistance - 24 hour    Equipment Recommendations  Other (comment) (TBD at next venue of care)       Precautions / Restrictions Precautions Precautions: Fall;Back Precaution Booklet Issued: No Required Braces or Orthoses: Spinal Brace Spinal Brace: Thoracolumbosacral orthotic Restrictions - Brace is just for comfort Weight Bearing  Restrictions: No      Mobility Bed Mobility Overal bed mobility: Needs Assistance Bed Mobility: Rolling;Sidelying to Sit;Sit to Supine Rolling: Max assist;+2 for physical assistance Sidelying to sit: Max assist;+2 for physical assistance   Sit to supine: Max assist;+2 for physical assistance   General bed mobility comments: Max A +2 due to decreased cognition and pain  Transfers Overall transfer level: Needs assistance Equipment used: 2 person hand held assist Transfers: Sit to/from Stand Sit to Stand: Max assist;+2 physical assistance         General transfer comment: Max A +2 with verbal cues for posture    Balance Overall balance assessment: History of Falls;Needs assistance Sitting-balance support: Bilateral upper extremity supported Sitting balance-Leahy Scale: Poor Sitting balance - Comments: min-mod A for balance Postural control: Left lateral lean;Posterior lean Standing balance support: Bilateral upper extremity supported Standing balance-Leahy Scale: Poor Standing balance comment: 2HHA with standing                           ADL either performed or assessed with clinical judgement   ADL Overall ADL's : Needs assistance/impaired Eating/Feeding: NPO   Grooming: Sitting;Cueing for sequencing;Maximal assistance Grooming Details (indicate cue type and reason): Mod A for sitting balance at EOB with cues for sequencing Upper Body Bathing: Bed level;Moderate assistance;Cueing for sequencing Upper Body Bathing Details (indicate cue type and reason): mod A with cueing for sequencing Lower Body Bathing: Total assistance;+2 for physical assistance;Bed level Lower Body Bathing Details (indicate cue type and reason): Total A +2 with Max A+2  for bed mobility and unable to reach legs due to pain and need for BUE support Upper Body Dressing : Maximal assistance;Sitting;Cueing for sequencing Upper Body Dressing Details (indicate cue type and reason): Max A with  cueing for sequencing Lower Body Dressing: Total assistance;+2 for physical assistance;Sit to/from stand Lower Body Dressing Details (indicate cue type and reason): Total A +2 due to need for BUE support when standing   Toilet Transfer Details (indicate cue type and reason): deferred due to pt safety   Toileting - Clothing Manipulation Details (indicate cue type and reason): deferred due to safety   Tub/Shower Transfer Details (indicate cue type and reason): deferred due to safety Functional mobility during ADLs: Maximal assistance;+2 for physical assistance (2 HHA) General ADL Comments: pt presents with generalized weakness, pain, and decreased cognition                  Pertinent Vitals/Pain Pain Assessment: Faces Faces Pain Scale: Hurts a little bit Pain Location: L hip  Pain Descriptors / Indicators: Discomfort;Grimacing;Guarding Pain Intervention(s): Limited activity within patient's tolerance;Monitored during session;Repositioned     Hand Dominance Left   Extremity/Trunk Assessment Upper Extremity Assessment Upper Extremity Assessment: Generalized weakness   Lower Extremity Assessment Lower Extremity Assessment: Defer to PT evaluation   Cervical / Trunk Assessment Cervical / Trunk Assessment: Other exceptions Cervical / Trunk Exceptions: s/p mechanical fall with fx   Communication Communication Communication: HOH   Cognition Arousal/Alertness: Awake/alert Behavior During Therapy: Flat affect;WFL for tasks assessed/performed Overall Cognitive Status: History of cognitive impairments - at baseline                                 General Comments: Oriented to self and situation   General Comments  Limited eval - pt with symptomatic orthostatic BP during session            Home Living Family/patient expects to be discharged to:: Private residence Living Arrangements: Spouse/significant other Available Help at Discharge: Family;Available 24  hours/day Type of Home: House       Home Layout: Two level Alternate Level Stairs-Number of Steps: flight   Bathroom Shower/Tub: Walk-in shower         Home Equipment: Cane - single point;Walker - 2 wheels;Shower seat          Prior Functioning/Environment Level of Independence: Needs assistance  Gait / Transfers Assistance Needed: uses cane and RW for mobility ADL's / Homemaking Assistance Needed: Wife assists in bathing and LB dressing            OT Problem List: Decreased strength;Decreased range of motion;Decreased activity tolerance;Impaired balance (sitting and/or standing);Decreased coordination;Decreased cognition;Decreased safety awareness;Decreased knowledge of precautions;Decreased knowledge of use of DME or AE;Pain;Cardiopulmonary status limiting activity      OT Treatment/Interventions: Self-care/ADL training;Therapeutic exercise;Energy conservation;DME and/or AE instruction;Therapeutic activities;Cognitive remediation/compensation;Patient/family education;Balance training    OT Goals(Current goals can be found in the care plan section) Acute Rehab OT Goals Patient Stated Goal: none stated OT Goal Formulation: With patient Time For Goal Achievement: 11/13/19 Potential to Achieve Goals: Good  OT Frequency: Min 2X/week           Co-evaluation PT/OT/SLP Co-Evaluation/Treatment: Yes Reason for Co-Treatment: Complexity of the patient's impairments (multi-system involvement);Necessary to address cognition/behavior during functional activity;For patient/therapist safety;To address functional/ADL transfers   OT goals addressed during session: Strengthening/ROM;Proper use of Adaptive equipment and DME;ADL's and self-care      AM-PAC OT "6  Clicks" Daily Activity     Outcome Measure Help from another person eating meals?:  (NPO) Help from another person taking care of personal grooming?: A Lot Help from another person toileting, which includes using toliet,  bedpan, or urinal?: Total Help from another person bathing (including washing, rinsing, drying)?: Total Help from another person to put on and taking off regular upper body clothing?: A Lot Help from another person to put on and taking off regular lower body clothing?: Total 6 Click Score: 7   End of Session Equipment Utilized During Treatment: Gait belt Nurse Communication: Mobility status  Activity Tolerance: Treatment limited secondary to medical complications (Comment);Patient limited by pain Patient left: in bed;with call bell/phone within reach;with bed alarm set;Other (comment) (physician in room)  OT Visit Diagnosis: Unsteadiness on feet (R26.81);Other abnormalities of gait and mobility (R26.89);Pain;Other symptoms and signs involving cognitive function;History of falling (Z91.81);Muscle weakness (generalized) (M62.81) Pain - Right/Left: Left Pain - part of body: Hip;Leg                Time: 2761-4709 OT Time Calculation (min): 33 min Charges:  OT General Charges $OT Visit: 1 Visit OT Evaluation $OT Eval Moderate Complexity: 1 Mod  Makalyn Lennox/OTS  Rhyker Silversmith 10/30/2019, 10:25 AM

## 2019-10-30 NOTE — Evaluation (Signed)
Physical Therapy Evaluation Patient Details Name: [name] MRN: [id number] DOB: 08/24/1933 Today's Date: 10/30/2019   History of Present Illness  [name] is an 86 yo male presenting after a fall down ~12 steps. Upon workup, the pt was found to have acute mildly displaced fx of L1-L4 transverse processes and R 12th rib as well as non-displaced fx of the 7th rib and L sacral ala. PMH includes: recurrent syncope, Parkinson's Disease, dementia, overactive bladder, HTN, HLD, GERD, BPH, bladder cancer, and osteoarthritis.   Clinical Impression  Pt in bed upon arrival of PT/OT, agreeable to evaluation at this time. Prior to admission the pt reports he was ambulating with use of a cane or RW, and receiving some assist from his wife for ADLs such as dressing and bathing. The pt now presents with limitations in functional mobility, coordination, strength, power, and stability due to above dx and other chronic conditions, and will continue to benefit from skilled PT to address these deficits. The pt was able to complete bed mobility and basic transfers today, but required maxA of 2 to complete safely due to weakness and instability in both seated and standing positions. The pt continues to be at high risk for continued falls and will benefit from skilled PT acutely and following d/c to improve mobility and reduce caregiver burden by improving safety and independence with mobility.      Follow Up Recommendations SNF;Supervision/Assistance - 24 hour    Equipment Recommendations  Other (comment) (defer to post acute)    Recommendations for Other Services       Precautions / Restrictions Precautions Precautions: Fall;Back Precaution Booklet Issued: No Precaution Comments: orthostatic. Required Braces or Orthoses: Spinal Brace Spinal Brace: Thoracolumbosacral orthotic;Applied in sitting position Restrictions Weight Bearing Restrictions: No      Mobility  Bed Mobility Overal bed mobility:  Needs Assistance Bed Mobility: Rolling;Sidelying to Sit;Sit to Supine Rolling: Max assist;+2 for physical assistance Sidelying to sit: Max assist;+2 for physical assistance   Sit to supine: Max assist;+2 for physical assistance   General bed mobility comments: Max A +2 due to decreased cognition and pain, assist to BLE and elevate trunk from bed. pt does attempt to assist with simple cues and sig extra time  Transfers Overall transfer level: Needs assistance Equipment used: 2 person hand held assist Transfers: Sit to/from Stand Sit to Stand: Max assist;+2 physical assistance         General transfer comment: Max A +2 with verbal cues for posture, pt with strong posterior lean, poor forward wt shift  Ambulation/Gait Ambulation/Gait assistance:  (unable)                  Balance Overall balance assessment: History of Falls;Needs assistance Sitting-balance support: Bilateral upper extremity supported Sitting balance-Leahy Scale: Poor Sitting balance - Comments: min-mod A for balance, did progress to minG at times but fluccuating Postural control: Left lateral lean;Posterior lean Standing balance support: Bilateral upper extremity supported Standing balance-Leahy Scale: Poor Standing balance comment: 2HHA with standing, strong posterior lean                             Pertinent Vitals/Pain Pain Assessment: Faces Faces Pain Scale: Hurts a little bit Pain Location: L hip  Pain Descriptors / Indicators: Discomfort;Grimacing;Guarding Pain Intervention(s): Limited activity within patient's tolerance;Monitored during session;Repositioned    Home Living Family/patient expects to be discharged to:: Private residence Living Arrangements: Spouse/significant other Available Help at Discharge:  Family;Available 24 hours/day Type of Home: House       Home Layout: Two level Home Equipment: Cane - single point;Walker - 2 wheels;Shower seat      Prior Function  Level of Independence: Needs assistance   Gait / Transfers Assistance Needed: pt reports using cane and RW for mobility, unclear on other equipment or level of assist needed  ADL's / Homemaking Assistance Needed: Wife assists in bathing and LB dressing        Hand Dominance   Dominant Hand: Left    Extremity/Trunk Assessment   Upper Extremity Assessment Upper Extremity Assessment: Generalized weakness    Lower Extremity Assessment Lower Extremity Assessment: Generalized weakness    Cervical / Trunk Assessment Cervical / Trunk Assessment: Other exceptions Cervical / Trunk Exceptions: multiple fxs: L1-L4 transverse processes, 12th rib, 7th rib, L sacral ala  Communication   Communication: HOH  Cognition Arousal/Alertness: Awake/alert Behavior During Therapy: Flat affect;WFL for tasks assessed/performed Overall Cognitive Status: History of cognitive impairments - at baseline                                 General Comments: Oriented to self and situation, initially some lethargy but improved with movement. Pt able to verbalize needs (ex: tissue to wipe his nose), but benefits from repeated cues for safety and sequencing      General Comments General comments (skin integrity, edema, etc.): limited eval, pt with symptomatic orthostatic BL during session, reports feeling "disoreinted"    Exercises     Assessment/Plan    PT Assessment Patient needs continued PT services  PT Problem List Decreased strength;Decreased mobility;Decreased safety awareness;Decreased coordination;Decreased knowledge of precautions;Decreased activity tolerance;Decreased cognition;Decreased balance;Decreased knowledge of use of DME       PT Treatment Interventions DME instruction;Therapeutic exercise;Gait training;Balance training;Stair training;Functional mobility training;Cognitive remediation;Therapeutic activities;Patient/family education    PT Goals (Current goals can be found in  the Care Plan section)  Acute Rehab PT Goals Patient Stated Goal: none stated PT Goal Formulation: Patient unable to participate in goal setting Time For Goal Achievement: 11/13/19 Potential to Achieve Goals: Fair    Frequency Min 3X/week   Barriers to discharge Decreased caregiver support pt currently requiring more assist than spouse can realistically provide    Co-evaluation PT/OT/SLP Co-Evaluation/Treatment: Yes Reason for Co-Treatment: Complexity of the patient's impairments (multi-system involvement);For patient/therapist safety;Necessary to address cognition/behavior during functional activity PT goals addressed during session: Mobility/safety with mobility;Balance OT goals addressed during session: Strengthening/ROM;Proper use of Adaptive equipment and DME;ADL's and self-care       AM-PAC PT "6 Clicks" Mobility  Outcome Measure Help needed turning from your back to your side while in a flat bed without using bedrails?: A Lot Help needed moving from lying on your back to sitting on the side of a flat bed without using bedrails?: A Lot Help needed moving to and from a bed to a chair (including a wheelchair)?: Total Help needed standing up from a chair using your arms (e.g., wheelchair or bedside chair)?: A Lot Help needed to walk in hospital room?: Total Help needed climbing 3-5 steps with a railing? : Total 6 Click Score: 9    End of Session Equipment Utilized During Treatment: Gait belt;Back brace Activity Tolerance: Patient limited by fatigue Patient left: in bed;with call bell/phone within reach;with bed alarm set (mitt on LUE) Nurse Communication: Mobility status (cognition) PT Visit Diagnosis: Repeated falls (R29.6);Muscle weakness (generalized) (M62.81);Difficulty in  walking, not elsewhere classified (R26.2);Pain Pain - part of body:  (back)    Time: 1561-5379 PT Time Calculation (min) (ACUTE ONLY): 38 min   Charges:   PT Evaluation $PT Eval Moderate  Complexity: 1 Mod          Karma Ganja, PT, DPT   Acute Rehabilitation Department Pager #: [phone]  Otho Bellows 10/30/2019, 11:59 AM

## 2019-10-30 NOTE — NC FL2 (Deleted)
[place] LEVEL OF CARE SCREENING TOOL     IDENTIFICATION  Patient Name: [name] Birthdate: 1933/09/06 Sex: male Admission Date (Current Location): 10/28/2019  [facility] and [place] Number:      Facility and Address:         Provider Number:    Attending Physician Name and Address:  Alma Friendly, MD  Relative Name and Phone Number:       Current Level of Care:   Recommended Level of Care:   Prior Approval Number:    Date Approved/Denied:   PASRR Number:    Discharge Plan:      Current Diagnoses: Patient Active Problem List   Diagnosis Date Noted  . Syncope and collapse 10/29/2019  . Multiple closed fractures of ribs of left side   . Sacral fracture, closed ([place])   . Closed fracture of transverse process of lumbar vertebra ([place])   . Fall   . Pain   . BPH (benign prostatic hyperplasia) 08/21/2018  . OAB (overactive bladder) 08/21/2018  . S/P lumbar laminectomy 02/05/2017  . PCP NOTES >>>>>>>>>>>>>>>>>> 11/19/2015  . Dementia due to Parkinson's disease without behavioral disturbance ([place]) 11/07/2015  . PD (Parkinson's disease) ([place]) 11/07/2015  . Bladder cancer ([place]) 04/26/2014  . Right inguinal hernia 09/10/2011  . Direct inguinal hernia 08/07/2011  . Urinary frequency 03/18/2011  . Skin lesion 03/18/2011  . General medical examination 03/18/2011  . Fatigue 10/14/2010  . RHINITIS 02/12/2010  . GERD 12/03/2009  . Depression 08/28/2009  . WEIGHT LOSS 08/28/2009  . PSA, INCREASED 06/11/2009  . Back pain 01/09/2009  . NEOPLASM OF UNCERTAIN BEHAVIOR OF SKIN 07/05/2008  . Hyperlipidemia 02/28/2007  . ERECTILE DYSFUNCTION 02/28/2007  . Essential hypertension 02/28/2007  . Osteoarthritis 02/28/2007    Orientation RESPIRATION BLADDER Height & Weight            Weight:   Height:     BEHAVIORAL SYMPTOMS/MOOD NEUROLOGICAL BOWEL NUTRITION STATUS           AMBULATORY STATUS COMMUNICATION OF NEEDS Skin                                Personal Care Assistance Level of Assistance              Functional Limitations Info             SPECIAL CARE FACTORS FREQUENCY                       Contractures      Additional Factors Info                  Current Medications (10/30/2019):  This is the current hospital active medication list Current Facility-Administered Medications  Medication Dose Route Frequency Provider Last Rate Last Admin  . acetaminophen (TYLENOL) tablet 650 mg  650 mg Oral Q6H PRN Gilles Chiquito B, MD   650 mg at 10/29/19 2149   Or  . acetaminophen (TYLENOL) suppository 650 mg  650 mg Rectal Q6H PRN Sid Falcon, MD      . ALPRAZolam Duanne Moron) tablet 0.25 mg  0.25 mg Oral TID PRN Sid Falcon, MD      . calcium carbonate (TUMS - dosed in mg elemental calcium) chewable tablet 200 mg of elemental calcium  1 tablet Oral PRN Alma Friendly, MD      . carbidopa-levodopa (SINEMET IR) 25-100  MG per tablet immediate release 2 tablet  2 tablet Oral QID Sid Falcon, MD   2 tablet at 10/30/19 1001  . enoxaparin (LOVENOX) injection 40 mg  40 mg Subcutaneous Q24H Alma Friendly, MD   40 mg at 10/29/19 1442  . fluticasone (FLONASE) 50 MCG/ACT nasal spray 2 spray  2 spray Each Nare Daily PRN Gilles Chiquito B, MD      . HYDROmorphone (DILAUDID) injection 0.5 mg  0.5 mg Intravenous Q4H PRN Alma Friendly, MD   0.5 mg at 10/29/19 1845  . ibuprofen (ADVIL) tablet 400-600 mg  400-600 mg Oral Q6H PRN Gilles Chiquito B, MD   600 mg at 10/30/19 1001  . mirabegron ER (MYRBETRIQ) tablet 50 mg  50 mg Oral q1600 Gilles Chiquito B, MD   50 mg at 10/29/19 1500  . oxyCODONE (Oxy IR/ROXICODONE) immediate release tablet 5 mg  5 mg Oral Q6H PRN Gilles Chiquito B, MD      . polyethylene glycol (MIRALAX / GLYCOLAX) packet 17 g  17 g Oral Daily PRN Gilles Chiquito B, MD      . polyethylene glycol (MIRALAX / GLYCOLAX) packet 17 g  17 g Oral Daily Alma Friendly, MD   17 g at 10/30/19 1002  .  senna-docusate (Senokot-S) tablet 1 tablet  1 tablet Oral BID Alma Friendly, MD   1 tablet at 10/30/19 1002  . sertraline (ZOLOFT) tablet 150 mg  150 mg Oral Daily Gilles Chiquito B, MD   150 mg at 10/30/19 1001  . sodium chloride flush (NS) 0.9 % injection 3 mL  3 mL Intravenous Q12H Gilles Chiquito B, MD   3 mL at 10/29/19 2143  . tamsulosin (FLOMAX) capsule 0.4 mg  0.4 mg Oral QPC supper Gilles Chiquito B, MD   0.4 mg at 10/29/19 1721     Discharge Medications: Please see discharge summary for a list of discharge medications.  Relevant Imaging Results:  Relevant Lab Results:   Additional Information    Sharin Mons, RN

## 2019-10-30 NOTE — Progress Notes (Addendum)
PROGRESS NOTE  [name]:[id number] DOB: [birth year] DOA: 10/28/2019 PCP: Colon Branch, MD  HPI/Recap of past 24 hours: HPI from Dr Jamse Mead [name] is a 86 y.o. male with medical history significant of Parkinson's Disease, overactive bladder, osteoarthritis, depression, HTN, HLD, GERD, anxiety, BPH, h/o bladder cancer, DDD who presents after a fall.  [name] has been having a decline in function in the last few months with unsteadiness on his feet due to parkinson's disease as well as worsening dementia. Prior to fall, family thought he was in the restroom, however, he did take it upon himself to walk up the stairs.  He lost his footing and fell down about 12 steps. During the fall, he was apparently conscious and speaking to himself per his wife.  He is unsure if he lost consciousness, but feels that he just slipped.  He does have a history of recurrent syncope and has been seeing cardiology with work up including a TTE (08/16/19-mild stenosis of the mitral valve, Normal EF, indeterminate diastolic parameters), 30 day monitor (no events) and the plan was to proceed with a loop recorder.  The visit to EP has not occurred as of yet. In the ED, he was found to have acute mildly displaced fractures of the right L1-L4 transverse processes and a back brace was placed.  Further found to have minimally displaced fracture of the twelfth rib on the right and a non displaced fracture of the seventh lateral rib with probable non displaced fracture through the left sacral ala. Neurosurgery and trauma was consulted.  Patient admitted for further management.     Today, met patient at bedside, with PT/OT working with patient.  Patient is oriented to self and date of birth, but disoriented to place, states he was in the [place].  Unable to obtain ROS as patient is not able to hold a conversation.      Assessment/Plan: Active Problems:   Hyperlipidemia   Depression   Essential hypertension    Osteoarthritis   Dementia due to Parkinson's disease without behavioral disturbance (HCC)   PD (Parkinson's disease) (HCC)   BPH (benign prostatic hyperplasia)   OAB (overactive bladder)   Syncope and collapse   Likely mechanical fall with multiple fractures History of Parkinson's, orthostatic vitals not clearly positive Acute mildly displaced fractures of the right L1-L4 transverse processes Minimally displaced fracture of the twelfth rib on the right and a non displaced fracture of the seventh lateral rib Non displaced fracture through the left sacral ala CT head/cervical spine with no acute abnormality Trauma surgery consulted, pain management Neurosurgery consulted, recommend back brace for comfort, mobilize as tolerated, follow-up as an outpatient  Pain management, incentive spirometry for rib fractures Fall precautions PT/OT  History of recurrent syncope EKG with sinus rhythm with PVCs Recent work up including a TTE (08/16/19-mild stenosis of the mitral valve, Normal EF, indeterminate diastolic parameters), 30 day monitor (no events) and the plan was to proceed with a loop recorder Follow-up with cardiology as an outpatient for possible loop recorder placement Telemetry  Leukocytosis Resolved Likely reactive, currently afebrile UA negative for infection CT chest with no pneumonia Daily CBC  Dementia/Parkinson's disease/dysphagia Delirium precautions SLP on board, recommend dysphagia 2 solids, with high risk of aspiration Continue Sinemet Fall precautions  BPH/overactive bladder Continue Myrbetriq, tamsulosin  Constipation Give enema once Bowel regimen while on narcotics  Depression Continue sertraline, Xanax        Malnutrition Type:  Malnutrition Characteristics:      Nutrition Interventions:       Estimated body mass index is 19.71 kg/m as calculated from the following:   Height as of 10/16/19: 5' 9" (1.753 m).   Weight as of 10/16/19:  60.6 kg.     Code Status: DNR  Family Communication: Plan to discuss with family  Disposition Plan: Status is: Inpatient  Remains inpatient appropriate because:Inpatient level of care appropriate due to severity of illness   Dispo: The patient is from: Home              Anticipated d/c is to: SNF              Anticipated d/c date is: 2 days              Patient currently is not medically stable to d/c.    Consultants:  Trauma surgery  Neurosurgery  Procedures:  None  Antimicrobials:  None  DVT prophylaxis: lovenox   Objective: Vitals:   10/29/19 2326 10/30/19 0300 10/30/19 0741 10/30/19 1412  BP: 124/73 (!) 145/75 (!) 116/95 127/64  Pulse: 88 85 (!) 55 (!) 50  Resp: _0 Temp: 97.8 F (36.6 C) 98 F (36.7 C) 97.6 F (36.4 C) 97.7 F (36.5 C)  TempSrc: Oral Oral Oral   SpO2: 99% 98% 96% 97%    Intake/Output Summary (Last 24 hours) at 10/30/2019 1536 Last data filed at 10/30/2019 0900 Gross per 24 hour  Intake 1095.81 ml  Output 500 ml  Net 595.81 ml   There were no vitals filed for this visit.  Exam:  General: NAD, alert, awake, disoriented, chronically ill-appearing  Cardiovascular: S1, S2 present  Respiratory: CTAB  Abdomen: Soft, nontender, nondistended, bowel sounds present  Musculoskeletal: No bilateral pedal edema noted  Skin: Normal  Psychiatry:  Unable to assess    Data Reviewed: CBC: Recent Labs  Lab 10/28/19 1945 10/28/19 2037 10/29/19 1025 10/29/19 1522 10/30/19 0327  WBC 14.2*  --  13.8* 13.6* 9.5  NEUTROABS 12.1*  --   --   --  7.6  HGB 13.1 12.9* 12.6* 12.8* 10.8*  HCT 40.3 38.0* 38.8* 38.6* 33.5*  MCV 97.3  --  96.0 95.8 96.3  PLT 209  --  239 251 314   Basic Metabolic Panel: Recent Labs  Lab 10/28/19 1945 10/28/19 2037 10/29/19 1522 10/30/19 0327  NA 141 141  --  139  K 4.3 3.7  --  4.0  CL 106 102  --  106  CO2 28  --   --  25  GLUCOSE 106* 100*  --  127*  BUN 19 20  --  21  CREATININE  0.99 0.90 0.96 1.12  CALCIUM 9.1  --   --  8.8*   GFR: CrCl cannot be calculated (Unknown ideal weight.). Liver Function Tests: Recent Labs  Lab 10/28/19 1945  AST 17  ALT 6  ALKPHOS 94  BILITOT 1.4*  PROT 6.3*  ALBUMIN 3.9   No results for input(s): LIPASE, AMYLASE in the last 168 hours. No results for input(s): AMMONIA in the last 168 hours. Coagulation Profile: Recent Labs  Lab 10/29/19 1025  INR 1.2   Cardiac Enzymes: No results for input(s): CKTOTAL, CKMB, CKMBINDEX, TROPONINI in the last 168 hours. BNP (last 3 results) No results for input(s): PROBNP in the last 8760 hours. HbA1C: No results for input(s): HGBA1C in the last 72 hours. CBG: Recent Labs  Lab 10/28/19 2142 10/30/19 0654  GLUCAP 81 101*   Lipid Profile: No results for input(s): CHOL, HDL, LDLCALC, TRIG, CHOLHDL, LDLDIRECT in the last 72 hours. Thyroid Function Tests: No results for input(s): TSH, T4TOTAL, FREET4, T3FREE, THYROIDAB in the last 72 hours. Anemia Panel: No results for input(s): VITAMINB12, FOLATE, FERRITIN, TIBC, IRON, RETICCTPCT in the last 72 hours. Urine analysis:    Component Value Date/Time   COLORURINE YELLOW 10/28/2019 2318   APPEARANCEUR CLEAR 10/28/2019 2318   LABSPEC 1.038 (H) 10/28/2019 2318   PHURINE 5.0 10/28/2019 2318   GLUCOSEU NEGATIVE 10/28/2019 2318   GLUCOSEU NEGATIVE 08/23/2019 1017   HGBUR SMALL (A) 10/28/2019 2318   HGBUR negative 05/22/2009 0911   BILIRUBINUR NEGATIVE 10/28/2019 2318   BILIRUBINUR neg 03/18/2011 0903   KETONESUR 5 (A) 10/28/2019 2318   PROTEINUR NEGATIVE 10/28/2019 2318   UROBILINOGEN 0.2 08/23/2019 1017   NITRITE NEGATIVE 10/28/2019 2318   LEUKOCYTESUR NEGATIVE 10/28/2019 2318   Sepsis Labs: _0 (procalcitonin:4,lacticidven:4)  ) Recent Results (from the past 240 hour(s))  SARS Coronavirus 2 by RT PCR (hospital order, performed in [place] hospital lab) Nasopharyngeal Nasopharyngeal Swab     Status: None   Collection  Time: 10/28/19 10:39 PM   Specimen: Nasopharyngeal Swab  Result Value Ref Range Status   SARS Coronavirus 2 NEGATIVE NEGATIVE Final    Comment: (NOTE) SARS-CoV-2 target nucleic acids are NOT DETECTED.  The SARS-CoV-2 RNA is generally detectable in upper and lower respiratory specimens during the acute phase of infection. The lowest concentration of SARS-CoV-2 viral copies this assay can detect is 250 copies / mL. A negative result does not preclude SARS-CoV-2 infection and should not be used as the sole basis for treatment or other patient management decisions.  A negative result may occur with improper specimen collection / handling, submission of specimen other than nasopharyngeal swab, presence of viral mutation(s) within the areas targeted by this assay, and inadequate number of viral copies (<250 copies / mL). A negative result must be combined with clinical observations, patient history, and epidemiological information.  Fact Sheet for Patients:   StrictlyIdeas.no  Fact Sheet for Healthcare Providers: BankingDealers.co.za  This test is not yet approved or  cleared by the [place] FDA and has been authorized for detection and/or diagnosis of SARS-CoV-2 by FDA under an Emergency Use Authorization (EUA).  This EUA will remain in effect (meaning this test can be used) for the duration of the COVID-19 declaration under Section 564(b)(1) of the Act, 21 U.S.C. section 360bbb-3(b)(1), unless the authorization is terminated or revoked sooner.  Performed at [facility], [place] [address], [place], [place] [address]       Studies: DG Swallowing Func-Speech Pathology  Result Date: 10/30/2019 Objective Swallowing Evaluation: Type of Study: MBS-Modified Barium Swallow Study  Patient Details Name: [name] MRN: [id number] Date of Birth: 09-04-1933 Today's Date: 10/30/2019 Time: SLP Start Time (ACUTE ONLY): 1310 -SLP Stop Time  (ACUTE ONLY): 1340 SLP Time Calculation (min) (ACUTE ONLY): 30 min Past Medical History: Past Medical History: Diagnosis Date  Allergic rhinitis   Anxiety   Bladder cancer [facility])   urologist-  dr Junious Silk--  low grade TA  BPH with elevated PSA   Complication of anesthesia   "paranoid after back surgery in october lasted 2 to 3 days"  DDD (degenerative disc disease), lumbar   Depression   Dry mouth   USES BIOTIN SPRAY/ MOUTHWASH  Frequency of urination   GERD (gastroesophageal reflux disease)   Hearing loss   does not wear  his hearing aid  History of kidney stones   10/1989  Hyperlipidemia   Hypertension   Mild obstructive sleep apnea   PER PT  MILD OSA , NO CPAP RX,  STUDY DONE 2009  Nocturia   OAB (overactive bladder)   Osteoarthritis   KNEES, SHOULDER  Parkinson disease ([place]) DX   2005  NEUROLOGIST-   DR TAT--  IDIOPATHIC PARKINSON'S /  TREMORS CONTROLLED WITH MEDS  Urge urinary incontinence  Past Surgical History: Past Surgical History: Procedure Laterality Date  CATARACT EXTRACTION W/ INTRAOCULAR LENS  IMPLANT, BILATERAL  2014  CYSTOSCOPY Bilateral 04/09/2017  Procedure: CYSTOSCOPY/ RETROGRADE;  Surgeon: Festus Aloe, MD;  Location: [facility];  Service: Urology;  Laterality: Bilateral;  CYSTOSCOPY W/ RETROGRADES Bilateral 12/14/2014  Procedure: CYSTOSCOPY BLADDER BIOPSY FULGERATION MITOMYCIN C BILATERAL RETROGRADE PYELOGRAM;  Surgeon: Festus Aloe, MD;  Location[facility];  Service: Urology;  Laterality: Bilateral;  CYSTOSCOPY WITH STENT PLACEMENT Left 12/19/2013  Procedure: CYSTOSCOPY BLADDER BX, LEFT URETERAL STENT PLACEMENT , LEFT RETROGRADE AND PYLOGRAM;  Surgeon: Festus Aloe, MD;  Location[facility];  Service: Urology;  Laterality: Left;  EYE SURGERY Bilateral   ioc for cataract  HERNIA REPAIR  2013  inguinal  INGUINAL HERNIA REPAIR  03/09/2012  Procedure: HERNIA REPAIR INGUINAL ADULT;  Surgeon: Adin Hector, MD;  Location: [facility];   Service: General;  Laterality: Right;  INSERTION OF MESH  03/09/2012  Procedure: INSERTION OF MESH;  Surgeon: Adin Hector, MD;  Location: [facility];  Service: General;  Laterality: Right;  LUMBAR LAMINECTOMY/DECOMPRESSION MICRODISCECTOMY N/A 02/05/2017  Procedure: Laminectomy and Foraminotomy - Lumbar One-Two,Lumbar Two-Three, Lumbar Three-Four.;  Surgeon: Eustace Moore, MD;  Location: [facility];  Service: Neurosurgery;  Laterality: N/A;  NASAL SINUS SURGERY  1982  ORIF RIGHT ARM FX  1949  HARDWARE REMOVED   REMOVAL BENIGN CYST LEFT FOREHEAD  1969  TRANSURETHRAL RESECTION OF BLADDER TUMOR N/A 12/19/2013  Procedure:  TRANSURETHRAL RESECTION OF BLADDER TUMOR WITH GYRUS (TURBT-GYRUS);  Surgeon: Festus Aloe, MD;  Location: [facility];  Service: Urology;  Laterality: N/A;  TRANSURETHRAL RESECTION OF BLADDER TUMOR N/A 04/09/2017  Procedure: TRANSURETHRAL RESECTION OF BLADDER TUMOR / (TURBT) 2-5cm;  Surgeon: Festus Aloe, MD;  Location: [facility];  Service: Urology;  Laterality: N/A;  ONLY NEEDS 90 MIN FOR BOTH PROCEDURES HPI: [name] is a 86 y.o. male with medical history significant of Parkinson's Disease, overactive bladder, osteoarthritis, depression, HTN, HLD, GERD, anxiety, BPH, h/o bladder cancer, DDD who presents after a fall. [name] has been having a decline in function in the last few months with unsteadiness on his feet due to parkinson's disease as well as worsening dementia. In the ED, he was found to have acute mildly displaced fractures of the right L1-L4 transverse processes and a back brace was placed.  Further found to have minimally displaced fracture of the twelfth rib on the right and a non displaced fracture of the seventh lateral rib with probable non displaced fracture through the left sacral ala. CT head/cervical spine with no acute abnormality. In 2015, pt was evaluated for dysphagia after Parkinsons dx, but swallow was WNL on MBS. Pt visited OP SLP for speech  therapy.  No data recorded Assessment / Plan / Recommendation CHL IP CLINICAL IMPRESSIONS 10/30/2019 Clinical Impression  Pt demonstrates a profound pharyngeal, and likely chronic, dysphagia due to decreased bolus flow through the cervical esophagus. Pt has a small but significant bony protrusion on C 4/5 that  visibly pushed again the opening of the UES. Pt has relatively good good pharyngeal peristalsis and laryngeal closure, but decreased hyoid excursion, epiglottic deflection and bolus propulsion. The tail fo the bolus does not transit through the UES and remains pooled in the pyriform sinuses. Residue is significant enough that aspiration occurs post swallow with all liquids tested. Pt does sense heavy tracheal aspirate and clears with a throat clear or cough, but it is repeatedly aspirated as he does not clear the residue from the pharynx. Pt is difficult to cue due to cognition; he could not achieve a chin tuck, would not attempt a head turn, and despite cueing for effortful swallows or ways to clear residue orally, he was not successful. Given that primary cause of dysphagia is anatomical, with a possible secondary component of weakness due to Parkinson's, this has likely been a chronic problem. Pt may tolerate aspiration, or may be slightly worse than baseline given his new injuries and impairment. Ongoing oral intake will need to accepted by family with known risk. Pt would be a poor candidate for alternate nutrition given mentation.   SLP Visit Diagnosis Dysphagia, pharyngeal phase (R13.13) Attention and concentration deficit following -- Frontal lobe and executive function deficit following -- Impact on safety and function Severe aspiration risk;Risk for inadequate nutrition/hydration   CHL IP TREATMENT RECOMMENDATION 10/30/2019 Treatment Recommendations Therapy as outlined in treatment plan below   Prognosis 10/30/2019 Prognosis for Safe Diet Advancement Guarded Barriers to Reach Goals Cognitive  deficits;Severity of deficits Barriers/Prognosis Comment -- CHL IP DIET RECOMMENDATION 10/30/2019 SLP Diet Recommendations Thin liquid;Dysphagia 2 (Fine chop) solids Liquid Administration via Cup Medication Administration Crushed with puree Compensations Slow rate;Small sips/bites Postural Changes Seated upright at 90 degrees   CHL IP OTHER RECOMMENDATIONS 10/30/2019 Recommended Consults -- Oral Care Recommendations Oral care before and after PO Other Recommendations --   CHL IP FOLLOW UP RECOMMENDATIONS 10/30/2019 Follow up Recommendations Skilled Nursing facility;24 hour supervision/assistance   CHL IP FREQUENCY AND DURATION 10/30/2019 Speech Therapy Frequency (ACUTE ONLY) min 2x/week Treatment Duration 2 weeks      CHL IP ORAL PHASE 10/30/2019 Oral Phase WFL Oral - Pudding Teaspoon -- Oral - Pudding Cup -- Oral - Honey Teaspoon -- Oral - Honey Cup -- Oral - Nectar Teaspoon -- Oral - Nectar Cup -- Oral - Nectar Straw -- Oral - Thin Teaspoon -- Oral - Thin Cup -- Oral - Thin Straw -- Oral - Puree -- Oral - Mech Soft -- Oral - Regular -- Oral - Multi-Consistency -- Oral - Pill -- Oral Phase - Comment --  CHL IP PHARYNGEAL PHASE 10/30/2019 Pharyngeal Phase Impaired Pharyngeal- Pudding Teaspoon -- Pharyngeal -- Pharyngeal- Pudding Cup -- Pharyngeal -- Pharyngeal- Honey Teaspoon Penetration/Apiration after swallow;Pharyngeal residue - pyriform Pharyngeal Material enters airway, passes BELOW cords then ejected out Pharyngeal- Honey Cup -- Pharyngeal -- Pharyngeal- Nectar Teaspoon Penetration/Apiration after swallow;Pharyngeal residue - pyriform Pharyngeal Material enters airway, passes BELOW cords and not ejected out despite cough attempt by patient Pharyngeal- Nectar Cup Penetration/Apiration after swallow;Pharyngeal residue - pyriform Pharyngeal Material enters airway, passes BELOW cords and not ejected out despite cough attempt by patient Pharyngeal- Nectar Straw Penetration/Apiration after swallow;Pharyngeal residue -  pyriform Pharyngeal Material enters airway, passes BELOW cords and not ejected out despite cough attempt by patient Pharyngeal- Thin Teaspoon Penetration/Apiration after swallow;Pharyngeal residue - pyriform Pharyngeal Material enters airway, passes BELOW cords and not ejected out despite cough attempt by patient Pharyngeal- Thin Cup Penetration/Apiration after swallow;Pharyngeal residue - pyriform Pharyngeal Material enters  airway, passes BELOW cords and not ejected out despite cough attempt by patient Pharyngeal- Thin Straw -- Pharyngeal -- Pharyngeal- Puree NT Pharyngeal -- Pharyngeal- Mechanical Soft -- Pharyngeal -- Pharyngeal- Regular -- Pharyngeal -- Pharyngeal- Multi-consistency -- Pharyngeal -- Pharyngeal- Pill -- Pharyngeal -- Pharyngeal Comment --  CHL IP CERVICAL ESOPHAGEAL PHASE 10/30/2019 Cervical Esophageal Phase Impaired Pudding Teaspoon -- Pudding Cup -- Honey Teaspoon -- Honey Cup -- Nectar Teaspoon -- Nectar Cup -- Nectar Straw -- Thin Teaspoon -- Thin Cup -- Thin Straw -- Puree -- Mechanical Soft -- Regular -- Multi-consistency -- Pill -- Cervical Esophageal Comment -- DeBlois, Katherene Ponto 10/30/2019, 3:07 PM               Scheduled Meds:  carbidopa-levodopa  2 tablet Oral QID   enoxaparin (LOVENOX) injection  40 mg Subcutaneous Q24H   mirabegron ER  50 mg Oral q1600   polyethylene glycol  17 g Oral Daily   senna-docusate  1 tablet Oral BID   sertraline  150 mg Oral Daily   sodium chloride flush  3 mL Intravenous Q12H   tamsulosin  0.4 mg Oral QPC supper    Continuous Infusions:   LOS: 1 day     Alma Friendly, MD [facility]  If 7PM-7AM, please contact night-coverage [email or web address] 10/30/2019, 3:36 PM

## 2019-10-30 NOTE — Progress Notes (Signed)
Modified Barium Swallow Progress Note  Patient Details  Name: [name] MRN: [id number] Date of Birth: [birth year]  Today's Date: 10/30/2019  Modified Barium Swallow completed.  Full report located under Chart Review in the Imaging Section.  Brief recommendations include the following:  Clinical Impression  Pt demonstrates a profound pharyngeal, and likely chronic, dysphagia due to decreased bolus flow through the cervical esophagus. Pt has a small but significant bony protrusion on C 4/5 that visibly pushed again the opening of the UES. Pt has relatively good good pharyngeal peristalsis and laryngeal closure, but decreased hyoid excursion, epiglottic deflection and bolus propulsion. The tail fo the bolus does not transit through the UES and remains pooled in the pyriform sinuses. Residue is significant enough that aspiration occurs post swallow with all liquids tested. Pt does sense heavy tracheal aspirate and clears with a throat clear or cough, but it is repeatedly aspirated as he does not clear the residue from the pharynx. Pt is difficult to cue due to cognition; he could not achieve a chin tuck, would not attempt a head turn, and despite cueing for effortful swallows or ways to clear residue orally, he was not successful. given that primary cause of dysphagia is anatomical, with possibly secondary component of weakness due to Parkinson's, this has likely been a chronic problem. Pt may tolerate aspiration, or may be slightly worse than baseline given his new injuries and impairment. Ongoing oral intake will need to accepted by family with known risk. Pt would be a poor candidate for alternate nutrition given mentation.    Swallow Evaluation Recommendations       SLP Diet Recommendations: Thin liquid;Dysphagia 2 (Fine chop) solids (with known risk of aspiration, for comfort)   Liquid Administration via: Cup   Medication Administration: Crushed with puree   Supervision: Full assist for  feeding   Compensations: Slow rate;Small sips/bites   Postural Changes: Seated upright at 90 degrees   Oral Care Recommendations: Oral care before and after PO        Klair Leising, Katherene Ponto 10/30/2019,3:01 PM

## 2019-10-30 NOTE — TOC Initial Note (Addendum)
Transition of Care [facility]) - Initial/Assessment Note    Patient Details  Name: [name] MRN: [id number] Date of Birth: 1934-03-28  Transition of Care [facility]) CM/SW Contact:    Sharin Mons, RN Phone Number: 10/30/2019, 4:33 PM  Clinical Narrative:     Admitted s/p fall, hx of Parkinson's Disease, overactive bladder, osteoarthritis, depression, HTN, HLD, GERD, anxiety, BPH, h/o bladder cancer, DDD, dementiaFrom with wife.              NCM spoke with pt's wife [name] @ bedside regarding d/c planning. [name] stated PTA  she and daughter had looked into pt going into a secured AFL / memory care unit with [facility]. NCM shared [name] evaluation and recommendations : SNF with 24/7 supervision and assistance.  [name] explained per admission liaison Demetrius Charity of [facility] [phone], fax # [phone]) could provide assistance / supervision needed. NCM called admission liaison while @ bedside on speaker and confirmed. Liaison stated pt will reside on a memory care unit/ locked unit with [facility] therapy providing PT needed. HH orders, F2F will be needed from MD for services.    TOC team will continue to monitor and follow for  Needs...   Verner Mould Nickell (Spouse)      [phone]         Expected Discharge Plan: Memory Care (PT/OT SERVICES) Barriers to Discharge: Continued Medical Work up   Patient Goals and CMS Choice        Expected Discharge Plan and Services Expected Discharge Plan: Memory Care (PT/OT SERVICES)                                              Prior Living Arrangements/Services                       Activities of Daily Living      Permission Sought/Granted                  Emotional Assessment              Admission diagnosis:  Syncope and collapse [R55] Pain [R52] Fall [W19.XXXA] Laceration of scalp, initial encounter [S01.01XA] Closed fracture of transverse process of lumbar vertebra, initial encounter ([place])  [S32.009A] Closed fracture of multiple ribs of left side, initial encounter [S22.42XA] Closed fracture of sacrum, unspecified portion of sacrum, initial encounter [facility]) [S32.10XA] Patient Active Problem List   Diagnosis Date Noted  . Syncope and collapse 10/29/2019  . Multiple closed fractures of ribs of left side   . Sacral fracture, closed ([place])   . Closed fracture of transverse process of lumbar vertebra ([place])   . Fall   . Pain   . BPH (benign prostatic hyperplasia) 08/21/2018  . OAB (overactive bladder) 08/21/2018  . S/P lumbar laminectomy 02/05/2017  . PCP NOTES >>>>>>>>>>>>>>>>>> 11/19/2015  . Dementia due to Parkinson's disease without behavioral disturbance ([place]) 11/07/2015  . PD (Parkinson's disease) ([place]) 11/07/2015  . Bladder cancer ([place]) 04/26/2014  . Right inguinal hernia 09/10/2011  . Direct inguinal hernia 08/07/2011  . Urinary frequency 03/18/2011  . Skin lesion 03/18/2011  . General medical examination 03/18/2011  . Fatigue 10/14/2010  . RHINITIS 02/12/2010  . GERD 12/03/2009  . Depression 08/28/2009  . WEIGHT LOSS 08/28/2009  . PSA, INCREASED 06/11/2009  . Back pain 01/09/2009  . NEOPLASM OF UNCERTAIN BEHAVIOR OF  SKIN 07/05/2008  . Hyperlipidemia 02/28/2007  . ERECTILE DYSFUNCTION 02/28/2007  . Essential hypertension 02/28/2007  . Osteoarthritis 02/28/2007   PCP:  Colon Branch, MD Pharmacy:   [facility] [address] [place], [place] [address] Phone: [phone] Fax: [phone]     Social Determinants of Health (SDOH) Interventions    Readmission Risk Interventions No flowsheet data found.

## 2019-10-30 NOTE — Evaluation (Signed)
Clinical/Bedside Swallow Evaluation Patient Details  Name: [name] MRN: [id number] Date of Birth: 1933-05-03  Today's Date: 10/30/2019 Time: SLP Start Time (ACUTE ONLY): 0908 SLP Stop Time (ACUTE ONLY): 0930 SLP Time Calculation (min) (ACUTE ONLY): 22 min  Past Medical History:  Past Medical History:  Diagnosis Date  . Allergic rhinitis   . Anxiety   . Bladder cancer [facility])    urologist-  dr eskridge--  low grade TA  . BPH with elevated PSA   . Complication of anesthesia    "paranoid after back surgery in october lasted 2 to 3 days"  . DDD (degenerative disc disease), lumbar   . Depression   . Dry mouth    USES BIOTIN SPRAY/ MOUTHWASH  . Frequency of urination   . GERD (gastroesophageal reflux disease)   . Hearing loss    does not wear his hearing aid  . History of kidney stones    10/1989  . Hyperlipidemia   . Hypertension   . Mild obstructive sleep apnea    PER PT  MILD OSA , NO CPAP RX,  STUDY DONE 2009  . Nocturia   . OAB (overactive bladder)   . Osteoarthritis    KNEES, SHOULDER  . Parkinson disease ([place]) DX   2005   NEUROLOGIST-   DR TAT--  IDIOPATHIC PARKINSON'S /  TREMORS CONTROLLED WITH MEDS  . Urge urinary incontinence    Past Surgical History:  Past Surgical History:  Procedure Laterality Date  . CATARACT EXTRACTION W/ INTRAOCULAR LENS  IMPLANT, BILATERAL  2014  . CYSTOSCOPY Bilateral 04/09/2017   Procedure: CYSTOSCOPY/ RETROGRADE;  Surgeon: Festus Aloe, MD;  Location: [facility];  Service: Urology;  Laterality: Bilateral;  . CYSTOSCOPY W/ RETROGRADES Bilateral 12/14/2014   Procedure: CYSTOSCOPY BLADDER BIOPSY FULGERATION MITOMYCIN C BILATERAL RETROGRADE PYELOGRAM;  Surgeon: Festus Aloe, MD;  Location: [facility];  Service: Urology;  Laterality: Bilateral;  . CYSTOSCOPY WITH STENT PLACEMENT Left 12/19/2013   Procedure: CYSTOSCOPY BLADDER BX, LEFT URETERAL STENT PLACEMENT , LEFT RETROGRADE AND PYLOGRAM;  Surgeon: Festus Aloe, MD;   Location: [facility];  Service: Urology;  Laterality: Left;  . EYE SURGERY Bilateral    ioc for cataract  . HERNIA REPAIR  2013   inguinal  . INGUINAL HERNIA REPAIR  03/09/2012   Procedure: HERNIA REPAIR INGUINAL ADULT;  Surgeon: Adin Hector, MD;  Location: [facility];  Service: General;  Laterality: Right;  . INSERTION OF MESH  03/09/2012   Procedure: INSERTION OF MESH;  Surgeon: Adin Hector, MD;  Location: [facility];  Service: General;  Laterality: Right;  . LUMBAR LAMINECTOMY/DECOMPRESSION MICRODISCECTOMY N/A 02/05/2017   Procedure: Laminectomy and Foraminotomy - Lumbar One-Two,Lumbar Two-Three, Lumbar Three-Four.;  Surgeon: Eustace Moore, MD;  Location: [facility];  Service: Neurosurgery;  Laterality: N/A;  . NASAL SINUS SURGERY  1982  . ORIF RIGHT ARM FX  1949   HARDWARE REMOVED   . REMOVAL BENIGN CYST LEFT FOREHEAD  1969  . TRANSURETHRAL RESECTION OF BLADDER TUMOR N/A 12/19/2013   Procedure:  TRANSURETHRAL RESECTION OF BLADDER TUMOR WITH GYRUS (TURBT-GYRUS);  Surgeon: Festus Aloe, MD;  Location: [facility];  Service: Urology;  Laterality: N/A;  . TRANSURETHRAL RESECTION OF BLADDER TUMOR N/A 04/09/2017   Procedure: TRANSURETHRAL RESECTION OF BLADDER TUMOR / (TURBT) 2-5cm;  Surgeon: Festus Aloe, MD;  Location: [facility];  Service: Urology;  Laterality: N/A;  ONLY NEEDS 90 MIN FOR BOTH PROCEDURES   HPI:  [name] is  a 86 y.o. male with medical history significant of Parkinson's Disease, overactive bladder, osteoarthritis, depression, HTN, HLD, GERD, anxiety, BPH, h/o bladder cancer, DDD who presents after a fall. [name] has been having a decline in function in the last few months with unsteadiness on his feet due to parkinson's disease as well as worsening dementia. In the ED, he was found to have acute mildly displaced fractures of the right L1-L4 transverse processes and a back brace was placed.  Further found to have minimally displaced fracture of  the twelfth rib on the right and a non displaced fracture of the seventh lateral rib with probable non displaced fracture through the left sacral ala. CT head/cervical spine with no acute abnormality. In 2015, pt was evaluated for dysphagia after Parkinsons dx, but swallow was WNL on MBS. Pt visited OP SLP for speech therapy.    Assessment / Plan / Recommendation Clinical Impression  Even when provided with optimal positioning with cues for neutral head, small bites and sips, hand over hand assist pt demonstrates intermittent coughing and multiple swallows with thin water but also oatmeal. Given history of Parkinsons and now further debilitating injury limiting mobility, pt will need instrumental swallow assessment to determine best diet and methods to reduce risk of aspiration pna.  SLP Visit Diagnosis: Dysphagia, oropharyngeal phase (R13.12)    Aspiration Risk  Moderate aspiration risk    Diet Recommendation NPO except meds   Medication Administration: Crushed with puree    Other  Recommendations Oral Care Recommendations: Oral care QID   Follow up Recommendations        Frequency and Duration            Prognosis Prognosis for Safe Diet Advancement: Fair Barriers to Reach Goals: Cognitive deficits      Swallow Study   General HPI: [name] is a 86 y.o. male with medical history significant of Parkinson's Disease, overactive bladder, osteoarthritis, depression, HTN, HLD, GERD, anxiety, BPH, h/o bladder cancer, DDD who presents after a fall. [name] has been having a decline in function in the last few months with unsteadiness on his feet due to parkinson's disease as well as worsening dementia. In the ED, he was found to have acute mildly displaced fractures of the right L1-L4 transverse processes and a back brace was placed.  Further found to have minimally displaced fracture of the twelfth rib on the right and a non displaced fracture of the seventh lateral rib with probable non  displaced fracture through the left sacral ala. CT head/cervical spine with no acute abnormality. In 2015, pt was evaluated for dysphagia after Parkinsons dx, but swallow was WNL on MBS. Pt visited OP SLP for speech therapy.  Type of Study: Bedside Swallow Evaluation Previous Swallow Assessment: see impression Diet Prior to this Study: Regular;Thin liquids Temperature Spikes Noted: No Respiratory Status: Room air History of Recent Intubation: No Behavior/Cognition: Alert;Cooperative;Pleasant mood Oral Cavity Assessment: Within Functional Limits Oral Care Completed by SLP: No Oral Cavity - Dentition: Adequate natural dentition Vision: Functional for self-feeding Self-Feeding Abilities: Needs assist Patient Positioning: Upright in bed Baseline Vocal Quality: Normal Volitional Cough: Weak Volitional Swallow: Able to elicit    Oral/Motor/Sensory Function Overall Oral Motor/Sensory Function: Within functional limits   Ice Chips Ice chips: Within functional limits   Thin Liquid Thin Liquid: Impaired Presentation: Cup;Straw Pharyngeal  Phase Impairments: Multiple swallows;Suspected delayed Swallow;Cough - Immediate    Nectar Thick Nectar Thick Liquid: Not tested   Honey Thick Honey Thick Liquid:  Not tested   Puree Puree: Impaired Presentation: Spoon Pharyngeal Phase Impairments: Multiple swallows;Wet Vocal Quality;Throat Clearing - Immediate;Cough - Immediate   Solid     Solid: Not tested      Lynann Beaver 10/30/2019,9:36 AM

## 2019-10-30 NOTE — Progress Notes (Signed)
Subjective: Patient reports that "I'm doing all right.". He is disoriented and stated that [place] was at a resort in [place]. Per RN, he was agitated throughout the night and not allowing the staff to bathe him. This morning he is mildly lethargic with mild dysphonia.    Objective: Vital signs in last 24 hours: Temp:  [97.6 F (36.4 C)-98.1 F (36.7 C)] 97.6 F (36.4 C) (07/12 0741) Pulse Rate:  [55-88] 55 (07/12 0741) Resp:  [16-18] 16 (07/12 0741) BP: (104-170)/(58-95) 116/95 (07/12 0741) SpO2:  [96 %-100 %] 96 % (07/12 0741) FiO2 (%):  [21 %] 21 % (07/11 1415)  Intake/Output from previous day: 07/11 0701 - 07/12 0700 In: 975.8 [I.V.:975.8] Out: 500 [Urine:500] Intake/Output this shift: No intake/output data recorded.  Physical Exam: The patient easily arouses to voice and appears mildly lethargic. He is oriented to self only. He follows commands. Back brace is in place. Right hand mitt on for patient safety. MAEW.   Lab Results: Recent Labs    10/29/19 1522 10/30/19 0327  WBC 13.6* 9.5  HGB 12.8* 10.8*  HCT 38.6* 33.5*  PLT 251 179   BMET Recent Labs    10/28/19 1945 10/28/19 1945 10/28/19 2037 10/28/19 2037 10/29/19 1522 10/30/19 0327  NA 141   < > 141  --   --  139  K 4.3   < > 3.7  --   --  4.0  CL 106   < > 102  --   --  106  CO2 28  --   --   --   --  25  GLUCOSE 106*   < > 100*  --   --  127*  BUN 19   < > 20  --   --  21  CREATININE 0.99   < > 0.90   < > 0.96 1.12  CALCIUM 9.1  --   --   --   --  8.8*   < > = values in this interval not displayed.    Studies/Results: DG Chest 1 View  Result Date: 10/28/2019 CLINICAL DATA:  Status post fall. EXAM: CHEST  1 VIEW COMPARISON:  February 01, 2017 FINDINGS: There is no evidence of acute infiltrate, pleural effusion or pneumothorax. The heart size and mediastinal contours are within normal limits. There is moderate severity calcification of the aortic arch. An acute fracture of the seventh left rib is seen.  IMPRESSION: Acute fracture of the seventh left rib. Electronically Signed   By: Virgina Norfolk M.D.   On: 10/28/2019 20:59   DG Pelvis 1-2 Views  Result Date: 10/28/2019 CLINICAL DATA:  Status post fall. EXAM: PELVIS - 1-2 VIEW COMPARISON:  None. FINDINGS: There is no evidence of pelvic fracture or diastasis. No pelvic bone lesions are seen. Mild degenerative changes seen within the bilateral hips. IMPRESSION: No acute osseous abnormality. Electronically Signed   By: Virgina Norfolk M.D.   On: 10/28/2019 21:05   CT HEAD WO CONTRAST  Result Date: 10/28/2019 CLINICAL DATA:  [address] downstairs, posterior scalp laceration, Parkinson's, dementia EXAM: CT HEAD WITHOUT CONTRAST CT CERVICAL SPINE WITHOUT CONTRAST TECHNIQUE: Multidetector CT imaging of the head and cervical spine was performed following the standard protocol without intravenous contrast. Multiplanar CT image reconstructions of the cervical spine were also generated. COMPARISON:  07/18/2019 FINDINGS: CT HEAD FINDINGS Brain: Confluent areas of decreased attenuation throughout the periventricular white matter consistent with chronic small vessel ischemic changes, stable. No sign of acute infarct or hemorrhage. Lateral ventricles  and remaining midline structures are unremarkable. No acute extra-axial fluid collections. No mass effect. Vascular: No hyperdense vessel or unexpected calcification. Skull: Normal. Negative for fracture or focal lesion. Sinuses/Orbits: Postsurgical changes bilateral maxillary sinuses. There is chronic mucosal thickening throughout the left maxillary sinus. Evidence of prior bilateral ethmoidectomies. Other: None. CT CERVICAL SPINE FINDINGS Alignment: Alignment is grossly anatomic. Skull base and vertebrae: No acute displaced fractures. Soft tissues and spinal canal: No prevertebral fluid or swelling. No visible canal hematoma. Disc levels: Mild multilevel spondylosis greatest at C4-5. There is diffuse facet hypertrophy. There  is right predominant neural foraminal encroachment at the C4/C5 level. Upper chest: Airway is patent. Lung apices are clear. Other: Reconstructed images demonstrate no additional findings. IMPRESSION: 1. No acute intracranial process. Stable chronic small-vessel ischemic changes throughout the white matter. 2. No acute cervical spine fracture. Electronically Signed   By: Randa Ngo M.D.   On: 10/28/2019 21:33   CT CHEST W CONTRAST  Result Date: 10/28/2019 CLINICAL DATA:  Acute pain due to trauma EXAM: CT CHEST, ABDOMEN, AND PELVIS WITH CONTRAST CT LUMBAR SPINE WITHOUT CONTRAST TECHNIQUE: Multidetector CT imaging of the chest, abdomen and pelvis was performed following the standard protocol during bolus administration of intravenous contrast. CONTRAST:  146mL OMNIPAQUE IOHEXOL 350 MG/ML SOLN COMPARISON:  CT L-spine dated November 14, 2009. CT of the pelvis dated November 15, 2013. FINDINGS: CT CHEST FINDINGS Cardiovascular: The heart size is normal. There are atherosclerotic changes of the thoracic aorta without evidence for dissection or aneurysm. There are coronary artery calcifications. There is no significant pericardial effusion. There is no large centrally located pulmonary embolism. Mediastinum/Nodes: -- No mediastinal lymphadenopathy. -- No hilar lymphadenopathy. -- No axillary lymphadenopathy. -- No supraclavicular lymphadenopathy. -- Normal thyroid gland where visualized. -  Unremarkable esophagus. Lungs/Pleura: There are trace bilateral pleural effusions. There is no pneumothorax. There is no focal infiltrate. The trachea is unremarkable. Musculoskeletal: There is an acute minimally displaced fracture of the rudimentary twelfth rib on the right. There may be a nondisplaced buckle fracture of the twelfth rib on the left.There is a nondisplaced fracture of the lateral seventh rib. There are advanced degenerative changes of the bilateral glenohumeral joints. CT ABDOMEN PELVIS FINDINGS Hepatobiliary: There is  a cyst in the left hepatic lobe. Cholelithiasis without acute inflammation.There is no biliary ductal dilation. Pancreas: Normal contours without ductal dilatation. No peripancreatic fluid collection. Spleen: Unremarkable. Adrenals/Urinary Tract: --Adrenal glands: Unremarkable. --Right kidney/ureter: No hydronephrosis or radiopaque kidney stones. --Left kidney/ureter: There is a nonobstructing stone in the upper pole the left kidney. --Urinary bladder: Unremarkable. Stomach/Bowel: --Stomach/Duodenum: No hiatal hernia or other gastric abnormality. Normal duodenal course and caliber. --Small bowel: Unremarkable. --Colon: There is a large amount of stool in the rectum. --Appendix: Not visualized. No right lower quadrant inflammation or free fluid. Vascular/Lymphatic: Atherosclerotic calcification is present within the non-aneurysmal abdominal aorta, without hemodynamically significant stenosis. --No retroperitoneal lymphadenopathy. --No mesenteric lymphadenopathy. --No pelvic or inguinal lymphadenopathy. Reproductive: The prostate gland is enlarged. Other: No ascites or free air. The abdominal wall is normal. Musculoskeletal. There are acute mildly displaced fractures of the right L1 through L4 transverse processes. Degenerative changes are noted throughout the lumbar spine. 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 There is mild anterolisthesis of L5 on S1. There are degenerative changes throughout the visualized lumbar spine, greatest at the L1-L2 and L2-L3 levels. Facet arthrosis is noted. There is a probable nondisplaced fracture through the left sacral ala (axial series 4, image 71 on  the lumbar reconstructions). IMPRESSION: 1. Acute mildly displaced fractures of the right L1 through L4 transverse processes. 2. There is a minimally displaced fracture of the posterior twelfth rib on the right. There is a nondisplaced fracture of the left lateral seventh rib. There is no pneumothorax. 3. Probable  nondisplaced fracture through the left sacral ala. 4. Trace bilateral pleural effusions. 5. Cholelithiasis without acute inflammation. 6. Nonobstructive left nephrolithiasis. 7. Prostatomegaly. 8. Large amount of stool in the rectum. Aortic Atherosclerosis (ICD10-I70.0). Electronically Signed   By: Constance Holster M.D.   On: 10/28/2019 21:38   CT CERVICAL SPINE WO CONTRAST  Result Date: 10/28/2019 CLINICAL DATA:  [place] downstairs, posterior scalp laceration, Parkinson's, dementia EXAM: CT HEAD WITHOUT CONTRAST CT CERVICAL SPINE WITHOUT CONTRAST TECHNIQUE: Multidetector CT imaging of the head and cervical spine was performed following the standard protocol without intravenous contrast. Multiplanar CT image reconstructions of the cervical spine were also generated. COMPARISON:  07/18/2019 FINDINGS: CT HEAD FINDINGS Brain: Confluent areas of decreased attenuation throughout the periventricular white matter consistent with chronic small vessel ischemic changes, stable. No sign of acute infarct or hemorrhage. Lateral ventricles and remaining midline structures are unremarkable. No acute extra-axial fluid collections. No mass effect. Vascular: No hyperdense vessel or unexpected calcification. Skull: Normal. Negative for fracture or focal lesion. Sinuses/Orbits: Postsurgical changes bilateral maxillary sinuses. There is chronic mucosal thickening throughout the left maxillary sinus. Evidence of prior bilateral ethmoidectomies. Other: None. CT CERVICAL SPINE FINDINGS Alignment: Alignment is grossly anatomic. Skull base and vertebrae: No acute displaced fractures. Soft tissues and spinal canal: No prevertebral fluid or swelling. No visible canal hematoma. Disc levels: Mild multilevel spondylosis greatest at C4-5. There is diffuse facet hypertrophy. There is right predominant neural foraminal encroachment at the C4/C5 level. Upper chest: Airway is patent. Lung apices are clear. Other: Reconstructed images demonstrate no  additional findings. IMPRESSION: 1. No acute intracranial process. Stable chronic small-vessel ischemic changes throughout the white matter. 2. No acute cervical spine fracture. Electronically Signed   By: Randa Ngo M.D.   On: 10/28/2019 21:33   CT ABDOMEN PELVIS W CONTRAST  Result Date: 10/28/2019 CLINICAL DATA:  Acute pain due to trauma EXAM: CT CHEST, ABDOMEN, AND PELVIS WITH CONTRAST CT LUMBAR SPINE WITHOUT CONTRAST TECHNIQUE: Multidetector CT imaging of the chest, abdomen and pelvis was performed following the standard protocol during bolus administration of intravenous contrast. CONTRAST:  162mL OMNIPAQUE IOHEXOL 350 MG/ML SOLN COMPARISON:  CT L-spine dated November 14, 2009. CT of the pelvis dated November 15, 2013. FINDINGS: CT CHEST FINDINGS Cardiovascular: The heart size is normal. There are atherosclerotic changes of the thoracic aorta without evidence for dissection or aneurysm. There are coronary artery calcifications. There is no significant pericardial effusion. There is no large centrally located pulmonary embolism. Mediastinum/Nodes: -- No mediastinal lymphadenopathy. -- No hilar lymphadenopathy. -- No axillary lymphadenopathy. -- No supraclavicular lymphadenopathy. -- Normal thyroid gland where visualized. -  Unremarkable esophagus. Lungs/Pleura: There are trace bilateral pleural effusions. There is no pneumothorax. There is no focal infiltrate. The trachea is unremarkable. Musculoskeletal: There is an acute minimally displaced fracture of the rudimentary twelfth rib on the right. There may be a nondisplaced buckle fracture of the twelfth rib on the left.There is a nondisplaced fracture of the lateral seventh rib. There are advanced degenerative changes of the bilateral glenohumeral joints. CT ABDOMEN PELVIS FINDINGS Hepatobiliary: There is a cyst in the left hepatic lobe. Cholelithiasis without acute inflammation.There is no biliary ductal dilation. Pancreas: Normal contours without ductal  dilatation.  No peripancreatic fluid collection. Spleen: Unremarkable. Adrenals/Urinary Tract: --Adrenal glands: Unremarkable. --Right kidney/ureter: No hydronephrosis or radiopaque kidney stones. --Left kidney/ureter: There is a nonobstructing stone in the upper pole the left kidney. --Urinary bladder: Unremarkable. Stomach/Bowel: --Stomach/Duodenum: No hiatal hernia or other gastric abnormality. Normal duodenal course and caliber. --Small bowel: Unremarkable. --Colon: There is a large amount of stool in the rectum. --Appendix: Not visualized. No right lower quadrant inflammation or free fluid. Vascular/Lymphatic: Atherosclerotic calcification is present within the non-aneurysmal abdominal aorta, without hemodynamically significant stenosis. --No retroperitoneal lymphadenopathy. --No mesenteric lymphadenopathy. --No pelvic or inguinal lymphadenopathy. Reproductive: The prostate gland is enlarged. Other: No ascites or free air. The abdominal wall is normal. Musculoskeletal. There are acute mildly displaced fractures of the right L1 through L4 transverse processes. Degenerative changes are noted throughout the lumbar spine. 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 There is mild anterolisthesis of L5 on S1. There are degenerative changes throughout the visualized lumbar spine, greatest at the L1-L2 and L2-L3 levels. Facet arthrosis is noted. There is a probable nondisplaced fracture through the left sacral ala (axial series 4, image 71 on the lumbar reconstructions). IMPRESSION: 1. Acute mildly displaced fractures of the right L1 through L4 transverse processes. 2. There is a minimally displaced fracture of the posterior twelfth rib on the right. There is a nondisplaced fracture of the left lateral seventh rib. There is no pneumothorax. 3. Probable nondisplaced fracture through the left sacral ala. 4. Trace bilateral pleural effusions. 5. Cholelithiasis without acute inflammation. 6. Nonobstructive left  nephrolithiasis. 7. Prostatomegaly. 8. Large amount of stool in the rectum. Aortic Atherosclerosis (ICD10-I70.0). Electronically Signed   By: Constance Holster M.D.   On: 10/28/2019 21:38   CT L-SPINE NO CHARGE  Result Date: 10/28/2019 CLINICAL DATA:  Acute pain due to trauma EXAM: CT CHEST, ABDOMEN, AND PELVIS WITH CONTRAST CT LUMBAR SPINE WITHOUT CONTRAST TECHNIQUE: Multidetector CT imaging of the chest, abdomen and pelvis was performed following the standard protocol during bolus administration of intravenous contrast. CONTRAST:  129mL OMNIPAQUE IOHEXOL 350 MG/ML SOLN COMPARISON:  CT L-spine dated November 14, 2009. CT of the pelvis dated November 15, 2013. FINDINGS: CT CHEST FINDINGS Cardiovascular: The heart size is normal. There are atherosclerotic changes of the thoracic aorta without evidence for dissection or aneurysm. There are coronary artery calcifications. There is no significant pericardial effusion. There is no large centrally located pulmonary embolism. Mediastinum/Nodes: -- No mediastinal lymphadenopathy. -- No hilar lymphadenopathy. -- No axillary lymphadenopathy. -- No supraclavicular lymphadenopathy. -- Normal thyroid gland where visualized. -  Unremarkable esophagus. Lungs/Pleura: There are trace bilateral pleural effusions. There is no pneumothorax. There is no focal infiltrate. The trachea is unremarkable. Musculoskeletal: There is an acute minimally displaced fracture of the rudimentary twelfth rib on the right. There may be a nondisplaced buckle fracture of the twelfth rib on the left.There is a nondisplaced fracture of the lateral seventh rib. There are advanced degenerative changes of the bilateral glenohumeral joints. CT ABDOMEN PELVIS FINDINGS Hepatobiliary: There is a cyst in the left hepatic lobe. Cholelithiasis without acute inflammation.There is no biliary ductal dilation. Pancreas: Normal contours without ductal dilatation. No peripancreatic fluid collection. Spleen: Unremarkable.  Adrenals/Urinary Tract: --Adrenal glands: Unremarkable. --Right kidney/ureter: No hydronephrosis or radiopaque kidney stones. --Left kidney/ureter: There is a nonobstructing stone in the upper pole the left kidney. --Urinary bladder: Unremarkable. Stomach/Bowel: --Stomach/Duodenum: No hiatal hernia or other gastric abnormality. Normal duodenal course and caliber. --Small bowel: Unremarkable. --Colon: There is a large amount of stool  in the rectum. --Appendix: Not visualized. No right lower quadrant inflammation or free fluid. Vascular/Lymphatic: Atherosclerotic calcification is present within the non-aneurysmal abdominal aorta, without hemodynamically significant stenosis. --No retroperitoneal lymphadenopathy. --No mesenteric lymphadenopathy. --No pelvic or inguinal lymphadenopathy. Reproductive: The prostate gland is enlarged. Other: No ascites or free air. The abdominal wall is normal. Musculoskeletal. There are acute mildly displaced fractures of the right L1 through L4 transverse processes. Degenerative changes are noted throughout the lumbar spine. 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 There is mild anterolisthesis of L5 on S1. There are degenerative changes throughout the visualized lumbar spine, greatest at the L1-L2 and L2-L3 levels. Facet arthrosis is noted. There is a probable nondisplaced fracture through the left sacral ala (axial series 4, image 71 on the lumbar reconstructions). IMPRESSION: 1. Acute mildly displaced fractures of the right L1 through L4 transverse processes. 2. There is a minimally displaced fracture of the posterior twelfth rib on the right. There is a nondisplaced fracture of the left lateral seventh rib. There is no pneumothorax. 3. Probable nondisplaced fracture through the left sacral ala. 4. Trace bilateral pleural effusions. 5. Cholelithiasis without acute inflammation. 6. Nonobstructive left nephrolithiasis. 7. Prostatomegaly. 8. Large amount of stool in the  rectum. Aortic Atherosclerosis (ICD10-I70.0). Electronically Signed   By: Constance Holster M.D.   On: 10/28/2019 21:38   DG Knee Complete 4 Views Left  Result Date: 10/28/2019 CLINICAL DATA:  Status post fall. EXAM: LEFT KNEE - COMPLETE 4+ VIEW COMPARISON:  None. FINDINGS: No evidence of acute fracture or dislocation. Mild medial and lateral tibiofemoral compartment space narrowing is seen. Moderate to marked severity vascular calcification is noted. A very small joint effusion is present. IMPRESSION: 1. No evidence of acute fracture or dislocation. 2. Very small joint effusion. Electronically Signed   By: Virgina Norfolk M.D.   On: 10/28/2019 21:04   DG FEMUR MIN 2 VIEWS LEFT  Result Date: 10/28/2019 CLINICAL DATA:  Status post fall. EXAM: LEFT FEMUR 2 VIEWS COMPARISON:  None. FINDINGS: There is no evidence of fracture or other focal bone lesions. Moderate severity vascular calcification is seen. IMPRESSION: No acute osseous abnormality. Electronically Signed   By: Virgina Norfolk M.D.   On: 10/28/2019 21:01   DG FEMUR, MIN 2 VIEWS RIGHT  Result Date: 10/28/2019 CLINICAL DATA:  Status post fall. EXAM: RIGHT FEMUR 2 VIEWS COMPARISON:  None. FINDINGS: There is no evidence of fracture or other focal bone lesions. There is moderate to marked severity vascular calcification. IMPRESSION: 1. No acute osseous abnormality. Electronically Signed   By: Virgina Norfolk M.D.   On: 10/28/2019 21:02    Assessment/Plan: Continue back brace for patient comfort as needed. If patient becomes agitated in regards to the back brace, it can be removed as the brace is for patient comfort and not for structural support. Ambulate patient as tolerated. Continue supportive care. No new neurosurgical recommendations at this time. Patient will need to follow up with me in the office as an outpatient.     LOS: 1 day    Peggyann Shoals, MD 10/30/2019, 9:04 AM

## 2019-10-31 ENCOUNTER — Other Ambulatory Visit: Payer: Self-pay

## 2019-10-31 DIAGNOSIS — S2242XA Multiple fractures of ribs, left side, initial encounter for closed fracture: Secondary | ICD-10-CM | POA: Diagnosis not present

## 2019-10-31 DIAGNOSIS — R55 Syncope and collapse: Secondary | ICD-10-CM | POA: Diagnosis not present

## 2019-10-31 DIAGNOSIS — N3281 Overactive bladder: Secondary | ICD-10-CM | POA: Diagnosis not present

## 2019-10-31 DIAGNOSIS — G20 Parkinson's disease: Secondary | ICD-10-CM | POA: Diagnosis not present

## 2019-10-31 DIAGNOSIS — Z7189 Other specified counseling: Secondary | ICD-10-CM

## 2019-10-31 DIAGNOSIS — S3210XA Unspecified fracture of sacrum, initial encounter for closed fracture: Secondary | ICD-10-CM

## 2019-10-31 DIAGNOSIS — F329 Major depressive disorder, single episode, unspecified: Secondary | ICD-10-CM | POA: Diagnosis not present

## 2019-10-31 DIAGNOSIS — S32009A Unspecified fracture of unspecified lumbar vertebra, initial encounter for closed fracture: Secondary | ICD-10-CM

## 2019-10-31 DIAGNOSIS — Z515 Encounter for palliative care: Secondary | ICD-10-CM

## 2019-10-31 LAB — BASIC METABOLIC PANEL
Anion gap: 12 (ref 5–15)
BUN: 21 mg/dL (ref 8–23)
CO2: 23 mmol/L (ref 22–32)
Calcium: 8.9 mg/dL (ref 8.9–10.3)
Chloride: 105 mmol/L (ref 98–111)
Creatinine, Ser: 0.98 mg/dL (ref 0.61–1.24)
GFR calc Af Amer: >60 mL/min (ref >60)
GFR calc non Af Amer: >60 mL/min (ref >60)
Glucose, Bld: 103 mg/dL — ABNORMAL HIGH (ref 70–99)
Potassium: 3.4 mmol/L — ABNORMAL LOW (ref 3.5–5.1)
Sodium: 140 mmol/L (ref 135–145)

## 2019-10-31 LAB — CBC WITH DIFFERENTIAL/PLATELET
Abs Immature Granulocytes: 0.04 K/uL (ref 0.00–0.07)
Basophils Absolute: 0.1 K/uL (ref 0.0–0.1)
Basophils Relative: 1 %
Eosinophils Absolute: 0.2 K/uL (ref 0.0–0.5)
Eosinophils Relative: 2 %
HCT: 32.3 % — ABNORMAL LOW (ref 39.0–52.0)
Hemoglobin: 10.5 g/dL — ABNORMAL LOW (ref 13.0–17.0)
Immature Granulocytes: 0 %
Lymphocytes Relative: 8 %
Lymphs Abs: 0.8 K/uL (ref 0.7–4.0)
MCH: 31.3 pg (ref 26.0–34.0)
MCHC: 32.5 g/dL (ref 30.0–36.0)
MCV: 96.4 fL (ref 80.0–100.0)
Monocytes Absolute: 0.9 K/uL (ref 0.1–1.0)
Monocytes Relative: 10 %
Neutro Abs: 7.7 K/uL (ref 1.7–7.7)
Neutrophils Relative %: 79 %
Platelets: 152 K/uL (ref 150–400)
RBC: 3.35 MIL/uL — ABNORMAL LOW (ref 4.22–5.81)
RDW: 12.9 % (ref 11.5–15.5)
WBC: 9.7 K/uL (ref 4.0–10.5)
nRBC: 0 % (ref 0.0–0.2)

## 2019-10-31 MED ORDER — POTASSIUM CHLORIDE CRYS ER 20 MEQ PO TBCR
40.0000 meq | EXTENDED_RELEASE_TABLET | Freq: Once | ORAL | Status: AC
  Administered 2019-10-31: 40 meq via ORAL
  Filled 2019-10-31: qty 2

## 2019-10-31 NOTE — Consult Note (Signed)
Consultation Note Date: 10/31/2019   Patient Name: [name]  DOB: [birth year]  MRN: [id number]  Age / Sex: 86 y.o., male  PCP: Joe Branch, MD Referring Physician: Alma Friendly, MD  Reason for Consultation: Establishing goals of care  HPI/Patient Profile: 86 y.o. male  with past medical history of bladder cancer, parkinsons and parkinsons dementia who was admitted on 10/28/2019 with multiple fractures after a fall secondary to a syncopal episode.  He is quite frail and has fractures to his lumbar vertebra, multiple ribs and sacrum.  Per PT he is a 2+ max assist for bed mobility due to fractures and pain.  He was found to have profound dysphagia secondary to a C4 - C5 bony protrusion against the opening of his upper esophageal sphincter.   This causes concern for aspiration.  He has had difficulty in the hospital with confusion and mild agitation.  PMT was asked to see to assist with determining overall goals of care.   Clinical Assessment and Goals of Care:  I have reviewed medical records including EPIC notes, labs and imaging, received report from the care team, examined the patient and met at bedside with his wife [name] to discuss diagnosis prognosis, [name], EOL wishes, disposition and options.  I introduced Palliative Medicine as specialized medical care for people living with serious illness. It focuses on providing relief from the symptoms and stress of a serious illness.   We discussed a brief life review of the patient.  [name] and [name] worked together in [employer] for [employer].  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  He grew up in the [place].  Prior to this admission [name] lived at home with [name].  He has in home care 10 am - 4 pm and 8 pm to 8 am.   [name] tells me that [name] thought  he lived in a facility and kept packing bags stating that he wanted to go home.  When she asked where home was he would give her their current address.  She has noticed his dementia becoming worse recently.  He was able to ambulate with a cane but she was concerned about the stairs and therefore was evaluating memory care/ALF in order to keep him safe and well cared for.    [name] mentions that [name] ate well but his weight has dropped from 210 to approximately 133 unintentionally.  We discussed her current illness and what it means in the larger context of her on-going co-morbidities.  Natural disease trajectory and expectations at EOL were discussed.  It is surprising to [name] how quickly [name] has declined.  As he lays in bed he talks about eating ice cream and seems to have pleasant conversations to himself   Advanced directives, concepts specific to code status, artifical feeding and hydration, and rehospitalization were considered and discussed.  [name] and [name] took care of their advanced directives years ago.  [name] does not want heroic  measures or feeding tubes.  Hospice and Palliative Care services outpatient were explained and offered.  [name] asked many questions which I answered to the best of my ability.  She would like to speak with her daughter [name] about next steps - Home with Hospice, ALF with or without Hospice or SNF.    Questions and concerns were addressed.  The family was encouraged to call with questions or concerns.   Primary Decision Maker:  NEXT OF KIN Wife, [name]    SUMMARY OF RECOMMENDATIONS    Patient was DNR prior to admission. Wife and daughter are considering next steps - including possibly home with Hospice, ALF +/- hospice or SNF rehab. PMT will follow up with [facility] tomorrow.  Code Status/Advance Care Planning:  DNR   Symptom Management:   Patient appears comfortable.  Additional Recommendations (Limitations, Scope, Preferences):  Minimize  Medications, full scope treatment.  Palliative Prophylaxis:   Delirium Protocol  Psycho-social/Spiritual:   Desire for further Chaplaincy support: would be welcomed.  He is Nurse, learning disability.  Prognosis:  Less than 6 months given rapid decline, rapid weight loss, delirium, fall with multiple fracture causing him to be bed bound.    Discharge Planning: To Be Determined      Primary Diagnoses: Present on Admission: . Syncope and collapse . Hyperlipidemia . Depression . Essential hypertension . Osteoarthritis . Dementia due to Parkinson's disease without behavioral disturbance ([place]) . PD (Parkinson's disease) ([place]) . BPH (benign prostatic hyperplasia) . OAB (overactive bladder)   I have reviewed the medical record, interviewed the patient and family, and examined the patient. The following aspects are pertinent.  Past Medical History:  Diagnosis Date  . Allergic rhinitis   . Anxiety   . Bladder cancer [facility])    urologist-  [name]--  low grade TA  . BPH with elevated PSA   . Complication of anesthesia    "paranoid after back surgery in october lasted 2 to 3 days"  . DDD (degenerative disc disease), lumbar   . Depression   . Dry mouth    USES BIOTIN SPRAY/ MOUTHWASH  . Frequency of urination   . GERD (gastroesophageal reflux disease)   . Hearing loss    does not wear his hearing aid  . History of kidney stones    10/1989  . Hyperlipidemia   . Hypertension   . Mild obstructive sleep apnea    PER PT  MILD OSA , NO CPAP RX,  STUDY DONE 2009  . Nocturia   . OAB (overactive bladder)   . Osteoarthritis    KNEES, SHOULDER  . Parkinson disease ([place]) DX   2005   NEUROLOGIST-   [name]--  IDIOPATHIC PARKINSON'S /  TREMORS CONTROLLED WITH MEDS  . Urge urinary incontinence    Social History   Socioeconomic History  . Marital status: Married    Spouse name: [name]  . Number of children: 2  . Years of education: Not on file  . Highest education level: Master's degree (e.g.,  MA, MS, MEng, MEd, MSW, MBA)  Occupational History  . Occupation: retired 1995, worked in [place] , finances   Tobacco Use  . Smoking status: Former Smoker    Packs/day: 1.00    Years: 15.00    Pack years: 15.00    Types: Cigarettes    Quit date: 04/20/1968    Years since quitting: 51.5  . Smokeless tobacco: Never Used  Vaping Use  . Vaping Use: Never used  Substance and Sexual Activity  .  Alcohol use: Yes    Comment: occassional glass of wine   . Drug use: No  . Sexual activity: Not Currently  Other Topics Concern  . Not on file  Social History Narrative   Lives w/ wife   Has a Daughter 66 y/o   Left handed   Social Determinants of Health   Financial Resource Strain: Low Risk   . Difficulty of Paying Living Expenses: Not hard at all  Food Insecurity: No Food Insecurity  . Worried About Charity fundraiser in the Last Year: Never true  . Ran Out of Food in the Last Year: Never true  Transportation Needs: No Transportation Needs  . Lack of Transportation (Medical): No  . Lack of Transportation (Non-Medical): No  Physical Activity:   . Days of Exercise per Week:   . Minutes of Exercise per Session:   Stress:   . Feeling of Stress :   Social Connections:   . Frequency of Communication with Friends and Family:   . Frequency of Social Gatherings with Friends and Family:   . Attends Religious Services:   . Active Member of Clubs or Organizations:   . Attends Archivist Meetings:   Marland Kitchen Marital Status:    Family History  Problem Relation Age of Onset  . Alzheimer's disease Mother   . Liver cancer Father   . Cancer Father        Joe  . Healthy Daughter   . Diabetes Son   . Prostate cancer Neg Hx   . CAD Neg Hx     Allergies  Allergen Reactions  . Pollen Extract Other (See Comments)  . Other Other (See Comments)    Paranoia from anesthesia drug last visit-unknown drug       Vital Signs: BP (!) 149/84 (BP Location: Left Arm)   Pulse 92   Temp 98  F (36.7 C) (Oral)   Resp 18   SpO2 97%  Pain Scale: PAINAD POSS *See Group Information*: 1-Acceptable,Awake and alert Pain Score: 2    SpO2: SpO2: 97 % O2 Device:SpO2: 97 % O2 Flow Rate: .O2 Flow Rate (L/min): 0 L/min (pt on room air)    Palliative Assessment/Data: 30%     Time In: 10:00 Time Out: 11:00 Time Total: 60 min. Visit consisted of counseling and education dealing with the complex and emotionally intense issues surrounding the need for palliative care and symptom management in the setting of serious and potentially life-threatening illness. Greater than 50%  of this time was spent counseling and coordinating care related to the above assessment and plan.  Signed by: Florentina Jenny, PA-C Palliative Medicine  Please contact Palliative Medicine Team phone at [phone] for questions and concerns.  For individual provider: See Shea Evans

## 2019-10-31 NOTE — Progress Notes (Signed)
  Speech Language Pathology Treatment: Dysphagia  Patient Details Name: [name] MRN: [id number] DOB: [birth year] Today's Date: 10/31/2019 Time: 1320-1400 SLP Time Calculation (min) (ACUTE ONLY): 40 min  Assessment / Plan / Recommendation Clinical Impression  Pt was seen at bedside for follow up after MBS completed yesterday. Pt was awake, but unable to participate in education due to confusion and intermittent agitation. [name] wife was present, and was receptive to review of MBS video with explanation of results and recommendations. SLP provided written suggestions regarding continuing PO intake with known aspiration risk. Per wife, Palliative Care consult was today. SLP set up suction to facilitate effective oral care. Pt was receptive to gentle oral care, but reported it hurt his tongue. Following oral care, pt accepted 1 bolus of magic cup. Multiple swallows, change in voice quality, and weak congested nonproductive cough noted. SLP will follow up to continue education with pt's wife. Pt is at significantly high risk of aspiration with any po intake. Wife was encouraged to complete oral care on a regular basis to minimize oral bacteria load, and minimize aspiration of oral bacteria with po trials.   HPI HPI: [name] is a 86 y.o. male with medical history significant of Parkinson's Disease, overactive bladder, osteoarthritis, depression, HTN, HLD, GERD, anxiety, BPH, h/o bladder cancer, DDD who presents after a fall. [name] has been having a decline in function in the last few months with unsteadiness on his feet due to parkinson's disease as well as worsening dementia. In the ED, he was found to have acute mildly displaced fractures of the right L1-L4 transverse processes and a back brace was placed.  Further found to have minimally displaced fracture of the twelfth rib on the right and a non displaced fracture of the seventh lateral rib with probable non displaced fracture through the left  sacral ala. CT head/cervical spine with no acute abnormality. In 2015, pt was evaluated for dysphagia after Parkinsons dx, but swallow was WNL on MBS. Pt visited OP SLP for speech therapy.       SLP Plan  Continue with current plan of care       Recommendations  Diet recommendations: Dysphagia 2 (fine chop);Thin liquid Liquids provided via: Cup;Straw Medication Administration: Crushed with puree Supervision: Staff to assist with self feeding;Full supervision/cueing for compensatory strategies Compensations: Slow rate;Small sips/bites Postural Changes and/or Swallow Maneuvers: Seated upright 90 degrees;Upright 30-60 min after meal                Oral Care Recommendations: Oral care QID Follow up Recommendations: Skilled Nursing facility;24 hour supervision/assistance SLP Visit Diagnosis: Dysphagia, pharyngeal phase (R13.13) Plan: Continue with current plan of care       GO              [name] B. [name], [facility], [place] Speech Language Pathologist Office: [phone] Pager: [phone]  Shonna Chock 10/31/2019, 2:25 PM

## 2019-10-31 NOTE — Progress Notes (Signed)
PROGRESS NOTE  [name] BTD:[id number] DOB: 1933-09-21 DOA: 10/28/2019 PCP: Colon Branch, MD  HPI/Recap of past 24 hours: HPI from Dr Jamse Mead [name] is a 86 y.o. male with medical history significant of Parkinson's Disease, overactive bladder, osteoarthritis, depression, HTN, HLD, GERD, anxiety, BPH, h/o bladder cancer, DDD who presents after a fall.  [name] has been having a decline in function in the last few months with unsteadiness on his feet due to parkinson's disease as well as worsening dementia. Prior to fall, family thought he was in the restroom, however, he did take it upon himself to walk up the stairs.  He lost his footing and fell down about 12 steps. During the fall, he was apparently conscious and speaking to himself per his wife.  He is unsure if he lost consciousness, but feels that he just slipped.  He does have a history of recurrent syncope and has been seeing cardiology with work up including a TTE (08/16/19-mild stenosis of the mitral valve, Normal EF, indeterminate diastolic parameters), 30 day monitor (no events) and the plan was to proceed with a loop recorder.  The visit to EP has not occurred as of yet. In the ED, he was found to have acute mildly displaced fractures of the right L1-L4 transverse processes and a back brace was placed.  Further found to have minimally displaced fracture of the twelfth rib on the right and a non displaced fracture of the seventh lateral rib with probable non displaced fracture through the left sacral ala. Neurosurgery and trauma was consulted.  Patient admitted for further management.     Today, patient seen and examined at bedside.  Disoriented, unable to engage in any meaningful conversation.  Wife at bedside, discussed extensively about patient's poor prognosis, rapid decline, multiple falls and dementia.  Wife deciding on possible hospice and relates that patient has mentioned it prior to him being disoriented       Assessment/Plan: Active Problems:   Hyperlipidemia   Depression   Essential hypertension   Osteoarthritis   Dementia due to Parkinson's disease without behavioral disturbance (HCC)   PD (Parkinson's disease) (HCC)   BPH (benign prostatic hyperplasia)   OAB (overactive bladder)   Syncope and collapse   Palliative care encounter   Likely mechanical fall with multiple fractures History of Parkinson's, orthostatic vitals not clearly positive Acute mildly displaced fractures of the right L1-L4 transverse processes Minimally displaced fracture of the twelfth rib on the right and a non displaced fracture of the seventh lateral rib Non displaced fracture through the left sacral ala CT head/cervical spine with no acute abnormality Trauma surgery consulted, pain management Neurosurgery consulted, recommend back brace for comfort, mobilize as tolerated, follow-up as an outpatient  Pain management, incentive spirometry for rib fractures Fall precautions PT/OT  History of recurrent syncope EKG with sinus rhythm with PVCs Recent work up including a TTE (08/16/19-mild stenosis of the mitral valve, Normal EF, indeterminate diastolic parameters), 30 day monitor (no events) and the plan was to proceed with a loop recorder Follow-up with cardiology as an outpatient for possible loop recorder placement Telemetry  Leukocytosis Resolved Likely reactive, currently afebrile UA negative for infection CT chest with no pneumonia Daily CBC  Dementia/Parkinson's disease/dysphagia Delirium precautions SLP on board, recommend dysphagia 2 solids for comfort, with high risk of aspiration Continue Sinemet Fall precautions  BPH/overactive bladder Continue Myrbetriq, tamsulosin  Constipation Give enema once Bowel regimen while on narcotics  Depression Continue sertraline, Xanax  Malnutrition Type:      Malnutrition Characteristics:      Nutrition Interventions:        Estimated body mass index is 19.71 kg/m as calculated from the following:   Height as of 10/16/19: 5\' 9"  (1.753 m).   Weight as of 10/16/19: 60.6 kg.     Code Status: DNR  Family Communication: Discussed with wife extensively at bedside on 10/31/2019  Disposition Plan: Status is: Inpatient  Remains inpatient appropriate because:Inpatient level of care appropriate due to severity of illness   Dispo: The patient is from: Home              Anticipated d/c is to: SNF Vs hospice              Anticipated d/c date is: 1 day              Patient currently is not medically stable to d/c.    Consultants:  Trauma surgery  Neurosurgery  Procedures:  None  Antimicrobials:  None  DVT prophylaxis: lovenox   Objective: Vitals:   10/30/19 1930 10/31/19 0300 10/31/19 0831 10/31/19 1430  BP: (!) 106/57 (!) 109/51 (!) 149/84 133/86  Pulse: 62 65 92 82  Resp: 17 16 18 17   Temp: 98 F (36.7 C) 98.1 F (36.7 C) 98 F (36.7 C) 98.2 F (36.8 C)  TempSrc: Oral Axillary Oral Axillary  SpO2: 93% 94% 97% 98%    Intake/Output Summary (Last 24 hours) at 10/31/2019 1624 Last data filed at 10/30/2019 2300 Gross per 24 hour  Intake --  Output 600 ml  Net -600 ml   There were no vitals filed for this visit.  Exam:  General: NAD, alert, awake, disoriented, chronically ill-appearing  Cardiovascular: S1, S2 present  Respiratory: CTAB  Abdomen: Soft, nontender, nondistended, bowel sounds present  Musculoskeletal: No bilateral pedal edema noted  Skin: Normal  Psychiatry:  Unable to assess    Data Reviewed: CBC: Recent Labs  Lab 10/28/19 1945 10/28/19 1945 10/28/19 2037 10/29/19 1025 10/29/19 1522 10/30/19 0327 10/31/19 0341  WBC 14.2*  --   --  13.8* 13.6* 9.5 9.7  NEUTROABS 12.1*  --   --   --   --  7.6 7.7  HGB 13.1   < > 12.9* 12.6* 12.8* 10.8* 10.5*  HCT 40.3   < > 38.0* 38.8* 38.6* 33.5* 32.3*  MCV 97.3  --   --  96.0 95.8 96.3 96.4  PLT 209  --   --  239  251 179 152   < > = values in this interval not displayed.   Basic Metabolic Panel: Recent Labs  Lab 10/28/19 1945 10/28/19 2037 10/29/19 1522 10/30/19 0327 10/31/19 0341  NA 141 141  --  139 140  K 4.3 3.7  --  4.0 3.4*  CL 106 102  --  106 105  CO2 28  --   --  25 23  GLUCOSE 106* 100*  --  127* 103*  BUN 19 20  --  21 21  CREATININE 0.99 0.90 0.96 1.12 0.98  CALCIUM 9.1  --   --  8.8* 8.9   GFR: CrCl cannot be calculated (Unknown ideal weight.). Liver Function Tests: Recent Labs  Lab 10/28/19 1945  AST 17  ALT 6  ALKPHOS 94  BILITOT 1.4*  PROT 6.3*  ALBUMIN 3.9   No results for input(s): LIPASE, AMYLASE in the last 168 hours. No results for input(s): AMMONIA in the last 168 hours. Coagulation Profile:  Recent Labs  Lab 10/29/19 1025  INR 1.2   Cardiac Enzymes: No results for input(s): CKTOTAL, CKMB, CKMBINDEX, TROPONINI in the last 168 hours. BNP (last 3 results) No results for input(s): PROBNP in the last 8760 hours. HbA1C: No results for input(s): HGBA1C in the last 72 hours. CBG: Recent Labs  Lab 10/28/19 2142 10/30/19 0654  GLUCAP 81 101*   Lipid Profile: No results for input(s): CHOL, HDL, LDLCALC, TRIG, CHOLHDL, LDLDIRECT in the last 72 hours. Thyroid Function Tests: No results for input(s): TSH, T4TOTAL, FREET4, T3FREE, THYROIDAB in the last 72 hours. Anemia Panel: No results for input(s): VITAMINB12, FOLATE, FERRITIN, TIBC, IRON, RETICCTPCT in the last 72 hours. Urine analysis:    Component Value Date/Time   COLORURINE YELLOW 10/28/2019 2318   APPEARANCEUR CLEAR 10/28/2019 2318   LABSPEC 1.038 (H) 10/28/2019 2318   PHURINE 5.0 10/28/2019 2318   GLUCOSEU NEGATIVE 10/28/2019 2318   GLUCOSEU NEGATIVE 08/23/2019 1017   HGBUR SMALL (A) 10/28/2019 2318   HGBUR negative 05/22/2009 0911   BILIRUBINUR NEGATIVE 10/28/2019 2318   BILIRUBINUR neg 03/18/2011 0903   KETONESUR 5 (A) 10/28/2019 2318   PROTEINUR NEGATIVE 10/28/2019 2318    UROBILINOGEN 0.2 08/23/2019 1017   NITRITE NEGATIVE 10/28/2019 2318   LEUKOCYTESUR NEGATIVE 10/28/2019 2318   Sepsis Labs: @LABRCNTIP (procalcitonin:4,lacticidven:4)  ) Recent Results (from the past 240 hour(s))  SARS Coronavirus 2 by RT PCR (hospital order, performed in [place] hospital lab) Nasopharyngeal Nasopharyngeal Swab     Status: None   Collection Time: 10/28/19 10:39 PM   Specimen: Nasopharyngeal Swab  Result Value Ref Range Status   SARS Coronavirus 2 NEGATIVE NEGATIVE Final    Comment: (NOTE) SARS-CoV-2 target nucleic acids are NOT DETECTED.  The SARS-CoV-2 RNA is generally detectable in upper and lower respiratory specimens during the acute phase of infection. The lowest concentration of SARS-CoV-2 viral copies this assay can detect is 250 copies / mL. A negative result does not preclude SARS-CoV-2 infection and should not be used as the sole basis for treatment or other patient management decisions.  A negative result may occur with improper specimen collection / handling, submission of specimen other than nasopharyngeal swab, presence of viral mutation(s) within the areas targeted by this assay, and inadequate number of viral copies (<250 copies / mL). A negative result must be combined with clinical observations, patient history, and epidemiological information.  Fact Sheet for Patients:   StrictlyIdeas.no  Fact Sheet for Healthcare Providers: BankingDealers.co.za  This test is not yet approved or  cleared by the [place] FDA and has been authorized for detection and/or diagnosis of SARS-CoV-2 by FDA under an Emergency Use Authorization (EUA).  This EUA will remain in effect (meaning this test can be used) for the duration of the COVID-19 declaration under Section 564(b)(1) of the Act, 21 U.S.C. section 360bbb-3(b)(1), unless the authorization is terminated or revoked sooner.  Performed at [facility], [place] [address] [place], [place] [address]       Studies: No results found.  Scheduled Meds: . carbidopa-levodopa  2 tablet Oral QID  . enoxaparin (LOVENOX) injection  40 mg Subcutaneous Q24H  . mirabegron ER  50 mg Oral q1600  . polyethylene glycol  17 g Oral Daily  . senna-docusate  1 tablet Oral BID  . sertraline  150 mg Oral Daily  . sodium chloride flush  3 mL Intravenous Q12H  . tamsulosin  0.4 mg Oral QPC supper    Continuous Infusions:   LOS:  2 days     Alma Friendly, MD [facility]  If 7PM-7AM, please contact night-coverage [email or web address] 10/31/2019, 4:24 PM

## 2019-10-31 NOTE — TOC CAGE-AID Note (Signed)
Transition of Care [facility]) - CAGE-AID Screening   Patient Details  Name: [name] MRN: [id number] Date of Birth: October 03, 1933  Transition of [facility]) CM/SW Contact:    Emeterio Reeve, [place] Phone Number: 10/31/2019, 1:33 PM   Clinical Narrative:  Pt is unable to participate in assessment due to documented memory impairment.   CAGE-AID Screening: Substance Abuse Screening unable to be completed due to: : Patient unable to participate                  Providence Crosby Clinical Social Worker [phone]

## 2019-10-31 NOTE — NC FL2 (Signed)
[place] LEVEL OF CARE SCREENING TOOL     IDENTIFICATION  Patient Name: [name] Birthdate: Oct 19, 1933 Sex: male Admission Date (Current Location): 10/28/2019  [facility] and [place] Number:  Herbalist and Address:  [facility] [place], [place] [address], [place], [place] [address]      Provider Number: [id number]  Attending Physician Name and Address:  Alma Friendly, MD  Relative Name and Phone Number:       Current Level of Care: Other (Comment) Recommended Level of Care: Memory Care Prior Approval Number:    Date Approved/Denied:   PASRR Number:    Discharge Plan: Other (Comment) (Secure AL/ Memory Care Unit)    Current Diagnoses: Patient Active Problem List   Diagnosis Date Noted  . Syncope and collapse 10/29/2019  . Multiple closed fractures of ribs of left side   . Sacral fracture, closed ([place])   . Closed fracture of transverse process of lumbar vertebra ([place])   . Fall   . Pain   . BPH (benign prostatic hyperplasia) 08/21/2018  . OAB (overactive bladder) 08/21/2018  . S/P lumbar laminectomy 02/05/2017  . PCP NOTES >>>>>>>>>>>>>>>>>> 11/19/2015  . Dementia due to Parkinson's disease without behavioral disturbance ([place]) 11/07/2015  . PD (Parkinson's disease) ([place]) 11/07/2015  . Bladder cancer ([place]) 04/26/2014  . Right inguinal hernia 09/10/2011  . Direct inguinal hernia 08/07/2011  . Urinary frequency 03/18/2011  . Skin lesion 03/18/2011  . General medical examination 03/18/2011  . Fatigue 10/14/2010  . RHINITIS 02/12/2010  . GERD 12/03/2009  . Depression 08/28/2009  . WEIGHT LOSS 08/28/2009  . PSA, INCREASED 06/11/2009  . Back pain 01/09/2009  . NEOPLASM OF UNCERTAIN BEHAVIOR OF SKIN 07/05/2008  . Hyperlipidemia 02/28/2007  . ERECTILE DYSFUNCTION 02/28/2007  . Essential hypertension 02/28/2007  . Osteoarthritis 02/28/2007    Orientation RESPIRATION BLADDER Height & Weight     Self  Normal Continent  Weight:   Height:     BEHAVIORAL SYMPTOMS/MOOD NEUROLOGICAL BOWEL NUTRITION STATUS      Incontinent Diet (refer to d/c summary, currently on dyspagia 2, thin liquids)  AMBULATORY STATUS COMMUNICATION OF NEEDS Skin   Extensive Assist Verbally Normal                       Personal Care Assistance Level of Assistance  Bathing, Feeding, Dressing Bathing Assistance: Maximum assistance Feeding assistance: Limited assistance Dressing Assistance: Maximum assistance     Functional Limitations Info  Sight, Hearing, Speech Sight Info: Adequate Hearing Info: Impaired (hoh) Speech Info: Adequate    SPECIAL CARE FACTORS FREQUENCY  PT (By licensed PT), OT (By licensed OT)     PT Frequency: 5x/week, evaluate and treat OT Frequency: 5x/week, evaluate and treat            Contractures Contractures Info: Not present    Additional Factors Info  Code Status, Allergies Code Status Info: Full Code Allergies Info: Pollen Extract           Current Medications (10/31/2019):  This is the current hospital active medication list Current Facility-Administered Medications  Medication Dose Route Frequency Provider Last Rate Last Admin  . acetaminophen (TYLENOL) tablet 650 mg  650 mg Oral Q6H PRN Sid Falcon, MD   650 mg at 10/30/19 1433   Or  . acetaminophen (TYLENOL) suppository 650 mg  650 mg Rectal Q6H PRN Sid Falcon, MD      . ALPRAZolam Duanne Moron) tablet 0.25 mg  0.25 mg Oral TID PRN Sid Falcon, MD   0.25 mg at 10/31/19 0905  . calcium carbonate (TUMS - dosed in mg elemental calcium) chewable tablet 200 mg of elemental calcium  1 tablet Oral PRN Alma Friendly, MD      . carbidopa-levodopa (SINEMET IR) 25-100 MG per tablet immediate release 2 tablet  2 tablet Oral QID Sid Falcon, MD   2 tablet at 10/31/19 0905  . enoxaparin (LOVENOX) injection 40 mg  40 mg Subcutaneous Q24H Alma Friendly, MD   40 mg at 10/30/19 1434  . fluticasone (FLONASE) 50 MCG/ACT nasal  spray 2 spray  2 spray Each Nare Daily PRN Sid Falcon, MD      . mirabegron ER [facility]) tablet 50 mg  50 mg Oral q1600 Sid Falcon, MD   50 mg at 10/30/19 1830  . oxyCODONE (Oxy IR/ROXICODONE) immediate release tablet 5 mg  5 mg Oral Q6H PRN Gilles Chiquito B, MD      . polyethylene glycol (MIRALAX / GLYCOLAX) packet 17 g  17 g Oral Daily Alma Friendly, MD   17 g at 10/31/19 0904  . senna-docusate (Senokot-S) tablet 1 tablet  1 tablet Oral BID Alma Friendly, MD   1 tablet at 10/31/19 0905  . sertraline (ZOLOFT) tablet 150 mg  150 mg Oral Daily Gilles Chiquito B, MD   150 mg at 10/31/19 0904  . sodium chloride flush (NS) 0.9 % injection 3 mL  3 mL Intravenous Q12H Gilles Chiquito B, MD   3 mL at 10/31/19 0906  . tamsulosin (FLOMAX) capsule 0.4 mg  0.4 mg Oral QPC supper Sid Falcon, MD   0.4 mg at 10/30/19 1830     Discharge Medications: Please see discharge summary for a list of discharge medications.  Relevant Imaging Results:  Relevant Lab Results:   Additional Information SS# [id number]  Sharin Mons, RN

## 2019-11-01 ENCOUNTER — Institutional Professional Consult (permissible substitution): Payer: Medicare Other | Admitting: Internal Medicine

## 2019-11-01 DIAGNOSIS — F0280 Dementia in other diseases classified elsewhere without behavioral disturbance: Secondary | ICD-10-CM | POA: Diagnosis not present

## 2019-11-01 DIAGNOSIS — F329 Major depressive disorder, single episode, unspecified: Secondary | ICD-10-CM | POA: Diagnosis not present

## 2019-11-01 DIAGNOSIS — S2242XA Multiple fractures of ribs, left side, initial encounter for closed fracture: Secondary | ICD-10-CM | POA: Diagnosis not present

## 2019-11-01 DIAGNOSIS — Z515 Encounter for palliative care: Secondary | ICD-10-CM

## 2019-11-01 DIAGNOSIS — S3210XA Unspecified fracture of sacrum, initial encounter for closed fracture: Secondary | ICD-10-CM | POA: Diagnosis not present

## 2019-11-01 DIAGNOSIS — S32009A Unspecified fracture of unspecified lumbar vertebra, initial encounter for closed fracture: Secondary | ICD-10-CM | POA: Diagnosis not present

## 2019-11-01 DIAGNOSIS — G20 Parkinson's disease: Secondary | ICD-10-CM | POA: Diagnosis not present

## 2019-11-01 LAB — BASIC METABOLIC PANEL
Anion gap: 8 (ref 5–15)
BUN: 20 mg/dL (ref 8–23)
CO2: 23 mmol/L (ref 22–32)
Calcium: 8.8 mg/dL — ABNORMAL LOW (ref 8.9–10.3)
Chloride: 108 mmol/L (ref 98–111)
Creatinine, Ser: 0.85 mg/dL (ref 0.61–1.24)
GFR calc Af Amer: >60 mL/min (ref >60)
GFR calc non Af Amer: >60 mL/min (ref >60)
Glucose, Bld: 107 mg/dL — ABNORMAL HIGH (ref 70–99)
Potassium: 3.9 mmol/L (ref 3.5–5.1)
Sodium: 139 mmol/L (ref 135–145)

## 2019-11-01 LAB — CBC WITH DIFFERENTIAL/PLATELET
Abs Immature Granulocytes: 0.04 10*3/uL (ref 0.00–0.07)
Basophils Absolute: 0.1 10*3/uL (ref 0.0–0.1)
Basophils Relative: 1 %
Eosinophils Absolute: 0.2 10*3/uL (ref 0.0–0.5)
Eosinophils Relative: 3 %
HCT: 31.7 % — ABNORMAL LOW (ref 39.0–52.0)
Hemoglobin: 10.1 g/dL — ABNORMAL LOW (ref 13.0–17.0)
Immature Granulocytes: 1 %
Lymphocytes Relative: 12 %
Lymphs Abs: 0.9 10*3/uL (ref 0.7–4.0)
MCH: 30.7 pg (ref 26.0–34.0)
MCHC: 31.9 g/dL (ref 30.0–36.0)
MCV: 96.4 fL (ref 80.0–100.0)
Monocytes Absolute: 0.8 10*3/uL (ref 0.1–1.0)
Monocytes Relative: 10 %
Neutro Abs: 5.7 10*3/uL (ref 1.7–7.7)
Neutrophils Relative %: 73 %
Platelets: 166 10*3/uL (ref 150–400)
RBC: 3.29 MIL/uL — ABNORMAL LOW (ref 4.22–5.81)
RDW: 13 % (ref 11.5–15.5)
WBC: 7.7 10*3/uL (ref 4.0–10.5)
nRBC: 0 % (ref 0.0–0.2)

## 2019-11-01 LAB — GLUCOSE, CAPILLARY: Glucose-Capillary: 103 mg/dL — ABNORMAL HIGH (ref 70–99)

## 2019-11-01 NOTE — Plan of Care (Signed)
  Problem: Education: Goal: Knowledge of General Education information will improve Description: Including pain rating scale, medication(s)/side effects and non-pharmacologic comfort measures Outcome: Not Progressing   Problem: Health Behavior/Discharge Planning: Goal: Ability to manage health-related needs will improve Outcome: Not Progressing   Problem: Clinical Measurements: Goal: Ability to maintain clinical measurements within normal limits will improve Outcome: Not Progressing  Patient has dementia

## 2019-11-01 NOTE — Progress Notes (Signed)
PT Refused all his night medicine including his sinemet IR scheduled for tonight, attending doctor paged and informed , will try again and monitor

## 2019-11-01 NOTE — Progress Notes (Signed)
Daily Progress Note   Patient Name: [name]       Date: 11/01/2019 DOB: December 17, 1933  Age: 86 y.o. MRN#: [id number] Attending Physician: Barb Merino, MD Primary Care Physician: Colon Branch, MD Admit Date: 10/28/2019  Reason for Consultation/Follow-up: To discuss complex medical decision making related to patient's goals of care  Subjective:   Met with [name] at bedside.  She is gathering data and struggling to make the right decision for [name].  We talked for about 45 min.  We reviewed  MOST form.    We discussed the FAST scale for alzheimers dementia.  [name]'s dementia appears to have advanced significantly as a result of his fall, fractures and hospitalization.  He continues to have delirium, occasionally throwing out a phrase that is not attached to any particular subject matter.  He is calm and does not appear in pain.  [name] has refused to eat today.  Lunch tray sits untouched.  He attempts a small sip of liquid and struggles with it.   He was too lethargic to work with PT but has had no sedating meds today.   I expressed the [name] that I'm concerned he looks [facility] appropriate.  [name] with agreeable to meet with Hospice to learn about them directly.    I contacted [name] to speak with her.   Assessment: Patient lethargic, delirious but controlled, not eating/drinking enough to survive.   Patient Profile/HPI:  86 y.o. male  with past medical history of bladder cancer, parkinsons and parkinsons dementia who was admitted on 10/28/2019 with multiple fractures after a fall secondary to a syncopal episode.  He is quite frail and has fractures to his lumbar vertebra, multiple ribs and sacrum.  Per PT he is a 2+ max assist for bed mobility due to fractures and pain.  He  was found to have profound dysphagia secondary to a C4 - C5 bony protrusion against the opening of his upper esophageal sphincter.   This causes concern for aspiration.  He has had difficulty in the hospital with confusion and mild agitation.  PMT was asked to see to assist with determining overall goals of care.    Length of Stay: 3   Vital Signs: BP (!) 151/78 (BP Location: Left Arm)   Pulse 83   Temp Marland Kitchen)  97.2 F (36.2 C) (Oral)   Resp 17   SpO2 90%  SpO2: SpO2: 90 % O2 Device: O2 Device: Room Air O2 Flow Rate: O2 Flow Rate (L/min): 0 L/min (pt on room air)       Palliative Assessment/Data: 10-20%     Palliative Care Plan    Recommendations/Plan:  Patient appears to engage less today than yesterday.  He is lethargic and has declined.  Will ask Hospice to give [name] information about their services and evaluate Mr. [name] facility vs Home with Hospice.   I fear he would not fair well at SNF rehab with delirium.  Code Status:  DNR  Prognosis:   < 2 weeks not eating / drinking enough to sustain.  Refractory delirium.   Discharge Planning:  To Be Determined  Care plan was discussed with Wife, MD  Thank you for allowing the Palliative Medicine Team to assist in the care of this patient.  Total time spent:  45 min.     Greater than 50%  of this time was spent counseling and coordinating care related to the above assessment and plan.  Joe Jenny, PA-C Palliative Medicine  Please contact Palliative MedicineTeam phone at [phone] for questions and concerns between 7 am - 7 pm.   Please see AMION for individual provider pager numbers.

## 2019-11-01 NOTE — Progress Notes (Signed)
Physical Therapy Treatment Patient Details Name: [name] MRN: [id number] DOB: April 28, 1933 Today's Date: 11/01/2019    History of Present Illness [name] is an 86 yo male presenting after a fall down ~12 steps. Upon workup, the pt was found to have acute mildly displaced fx of L1-L4 transverse processes and R 12th rib as well as non-displaced fx of the 7th rib and L sacral ala. PMH includes: recurrent syncope, Parkinson's Disease, dementia, overactive bladder, HTN, HLD, GERD, BPH, bladder cancer, and osteoarthritis.     PT Comments    Pt was seen for mobility and was too sleepy to sit up on side of bed.  Repositioned in midline orientation, and were able to get him to acknowledge the therapy session to move legs.  Assisted with ROM and then pt was quite lethargic.  Follow up with him to get more active mobility going toward return to gait skills.   Follow Up Recommendations  SNF     Equipment Recommendations  None recommended by PT (will have rehab team at SNF decide)    Recommendations for Other Services       Precautions / Restrictions Precautions Precautions: Fall;Back Precaution Booklet Issued: No Precaution Comments: orthostatic. Required Braces or Orthoses: Spinal Brace Spinal Brace: Thoracolumbosacral orthotic;Applied in sitting position Restrictions Weight Bearing Restrictions: No    Mobility  Bed Mobility Overal bed mobility: Needs Assistance Bed Mobility: Rolling (midline orientation) Rolling: Mod assist         General bed mobility comments: mod assist to reposition in midline  Transfers Overall transfer level: Needs assistance               General transfer comment: pt is too lethargic to sit up on bed  Ambulation/Gait                 Stairs             Wheelchair Mobility    Modified Rankin (Stroke Patients Only)       Balance                                            Cognition  Arousal/Alertness: Lethargic Behavior During Therapy: Flat affect Overall Cognitive Status: History of cognitive impairments - at baseline                                 General Comments: sleepy      Exercises General Exercises - Lower Extremity Ankle Circles/Pumps: AAROM;10 reps Heel Slides: AAROM;10 reps Hip ABduction/ADduction: AAROM;10 reps Straight Leg Raises: AAROM;10 reps Hip Flexion/Marching: AAROM;10 reps    General Comments        Pertinent Vitals/Pain Pain Assessment: Faces Faces Pain Scale: Hurts a little bit Pain Location: minor discomfort repositioning Pain Descriptors / Indicators: Guarding Pain Intervention(s): Monitored during session;Repositioned    Home Living                      Prior Function            PT Goals (current goals can now be found in the care plan section) Acute Rehab PT Goals Patient Stated Goal: none stated-lethargic Progress towards PT goals: Progressing toward goals    Frequency    Min 3X/week      PT Plan Current plan remains  appropriate    Co-evaluation              AM-PAC PT "6 Clicks" Mobility   Outcome Measure  Help needed turning from your back to your side while in a flat bed without using bedrails?: A Lot Help needed moving from lying on your back to sitting on the side of a flat bed without using bedrails?: A Lot Help needed moving to and from a bed to a chair (including a wheelchair)?: Total Help needed standing up from a chair using your arms (e.g., wheelchair or bedside chair)?: Total Help needed to walk in hospital room?: Total Help needed climbing 3-5 steps with a railing? : Total 6 Click Score: 8    End of Session   Activity Tolerance: Patient limited by fatigue Patient left: in bed;with call bell/phone within reach;with bed alarm set Nurse Communication: Mobility status PT Visit Diagnosis: Repeated falls (R29.6);Muscle weakness (generalized) (M62.81);Difficulty in  walking, not elsewhere classified (R26.2);Pain     Time: 2956-2130 PT Time Calculation (min) (ACUTE ONLY): 15 min  Charges:                       Ramond Dial 11/01/2019, 2:43 PM  Mee Hives, PT MS Acute Rehab Dept. Number: [place]

## 2019-11-01 NOTE — Progress Notes (Signed)
PROGRESS NOTE    MADHAV MOHON  WUX:[id number] DOB: May 18, 1933 DOA: 10/28/2019 PCP: Colon Branch, MD    Brief Narrative:  Samit Sylve Coxis a 86 y.o.malewith medical history significant ofParkinson's Disease, overactive bladder, osteoarthritis, depression, HTN, HLD, GERD, anxiety, BPH, h/o bladder cancer, DDD who presents after a fall with recent deterioration of symptoms, decline in function and debility with poor appetite.  In the emergency room, hemodynamically stable.  L1-L4 transverse process fracture, right 12th rib and seventh lateral rib fracture.  Neurosurgery and trauma consulted.  Conservative management advised.   Assessment & Plan:   Active Problems:   Hyperlipidemia   Depression   Essential hypertension   Osteoarthritis   Dementia due to Parkinson's disease without behavioral disturbance (HCC)   PD (Parkinson's disease) (HCC)   BPH (benign prostatic hyperplasia)   OAB (overactive bladder)   Syncope and collapse   Palliative care encounter  Mechanical fall with multiple fractures, history of Parkinson's ,autonomic dysfunction, failure to thrive, dysphagia Trauma and neurosurgery recommended conservative management. Adequate pain medications.  PT OT if able.  Back brace for comfort. With poor outcome, palliative consulted, discussing about possible hospice options versus skilled nursing facility placement. Seen by nutrition and speech, remains on dysphagia 2 diet.  Recurrent syncope: No obvious cause.  Recent echocardiogram normal.  Cardiology planning loop recorder placement as outpatient.  Poor prognosis.  Depression: On sertraline and Xanax that continued.  Nutrition Status:  Moderate protein calorie malnutrition, underweight. Augment nutrition, currently limited due to dysphagia.      DVT prophylaxis: enoxaparin (LOVENOX) injection 40 mg Start: 10/29/19 1430   Code Status: DNR  family Communication: None today.  Will meet with family. Disposition Plan:  Status is: Inpatient  Remains inpatient appropriate because:Unsafe d/c plan   Dispo: The patient is from: Home              Anticipated d/c is to: Unknown.  Probable hospice.              Anticipated d/c date is: 2 days              Patient currently is not medically stable to d/c.         Consultants:   Trauma, neurosurgery, palliative medicine  Procedures:   None  Antimicrobials:   None   Subjective: Patient seen and examined.  No overnight events.  Poor historian.  Unable to give any information.  Mostly sleepy and eyes closed.  Hoarse voice.  Objective: Vitals:   10/31/19 0831 10/31/19 1430 10/31/19 2235 11/01/19 0648  BP: (!) 149/84 133/86 (!) 141/90 (!) 154/98  Pulse: 92 82 86 89  Resp: 18 17 18 16   Temp: 98 F (36.7 C) 98.2 F (36.8 C) 98.6 F (37 C) 98.6 F (37 C)  TempSrc: Oral Axillary Axillary Axillary  SpO2: 97% 98% 95% 90%    Intake/Output Summary (Last 24 hours) at 11/01/2019 1050 Last data filed at 10/31/2019 1900 Gross per 24 hour  Intake 240 ml  Output 500 ml  Net -260 ml   There were no vitals filed for this visit.  Examination:  Physical Exam Constitutional:      Comments: Chronically sick looking, not in any distress.  HENT:     Head: Normocephalic.  Eyes:     Pupils: Pupils are equal, round, and reactive to light.  Cardiovascular:     Rate and Rhythm: Regular rhythm.  Abdominal:     Palpations: Abdomen is soft.  Neurological:  General: No focal deficit present.     Comments: Sleepy, lethargic, alert oriented x1.  Confused and disoriented.        Data Reviewed: I have personally reviewed following labs and imaging studies  CBC: Recent Labs  Lab 10/28/19 1945 10/28/19 2037 10/29/19 1025 10/29/19 1522 10/30/19 0327 10/31/19 0341 11/01/19 0132  WBC 14.2*   < > 13.8* 13.6* 9.5 9.7 7.7  NEUTROABS 12.1*  --   --   --  7.6 7.7 5.7  HGB 13.1   < > 12.6* 12.8* 10.8* 10.5* 10.1*  HCT 40.3   < > 38.8* 38.6* 33.5*  32.3* 31.7*  MCV 97.3   < > 96.0 95.8 96.3 96.4 96.4  PLT 209   < > 239 251 179 152 166   < > = values in this interval not displayed.   Basic Metabolic Panel: Recent Labs  Lab 10/28/19 1945 10/28/19 1945 10/28/19 2037 10/29/19 1522 10/30/19 0327 10/31/19 0341 11/01/19 0132  NA 141  --  141  --  139 140 139  K 4.3  --  3.7  --  4.0 3.4* 3.9  CL 106  --  102  --  106 105 108  CO2 28  --   --   --  25 23 23   GLUCOSE 106*  --  100*  --  127* 103* 107*  BUN 19  --  20  --  21 21 20   CREATININE 0.99   < > 0.90 0.96 1.12 0.98 0.85  CALCIUM 9.1  --   --   --  8.8* 8.9 8.8*   < > = values in this interval not displayed.   GFR: CrCl cannot be calculated (Unknown ideal weight.). Liver Function Tests: Recent Labs  Lab 10/28/19 1945  AST 17  ALT 6  ALKPHOS 94  BILITOT 1.4*  PROT 6.3*  ALBUMIN 3.9   No results for input(s): LIPASE, AMYLASE in the last 168 hours. No results for input(s): AMMONIA in the last 168 hours. Coagulation Profile: Recent Labs  Lab 10/29/19 1025  INR 1.2   Cardiac Enzymes: No results for input(s): CKTOTAL, CKMB, CKMBINDEX, TROPONINI in the last 168 hours. BNP (last 3 results) No results for input(s): PROBNP in the last 8760 hours. HbA1C: No results for input(s): HGBA1C in the last 72 hours. CBG: Recent Labs  Lab 10/28/19 2142 10/30/19 0654 11/01/19 0737  GLUCAP 81 101* 103*   Lipid Profile: No results for input(s): CHOL, HDL, LDLCALC, TRIG, CHOLHDL, LDLDIRECT in the last 72 hours. Thyroid Function Tests: No results for input(s): TSH, T4TOTAL, FREET4, T3FREE, THYROIDAB in the last 72 hours. Anemia Panel: No results for input(s): VITAMINB12, FOLATE, FERRITIN, TIBC, IRON, RETICCTPCT in the last 72 hours. Sepsis Labs: Recent Labs  Lab 10/28/19 2028  LATICACIDVEN 1.1    Recent Results (from the past 240 hour(s))  SARS Coronavirus 2 by RT PCR (hospital order, performed in [facility] hospital lab) Nasopharyngeal Nasopharyngeal Swab      Status: None   Collection Time: 10/28/19 10:39 PM   Specimen: Nasopharyngeal Swab  Result Value Ref Range Status   SARS Coronavirus 2 NEGATIVE NEGATIVE Final    Comment: (NOTE) SARS-CoV-2 target nucleic acids are NOT DETECTED.  The SARS-CoV-2 RNA is generally detectable in upper and lower respiratory specimens during the acute phase of infection. The lowest concentration of SARS-CoV-2 viral copies this assay can detect is 250 copies / mL. A negative result does not preclude SARS-CoV-2 infection and should not be used as  the sole basis for treatment or other patient management decisions.  A negative result may occur with improper specimen collection / handling, submission of specimen other than nasopharyngeal swab, presence of viral mutation(s) within the areas targeted by this assay, and inadequate number of viral copies (<250 copies / mL). A negative result must be combined with clinical observations, patient history, and epidemiological information.  Fact Sheet for Patients:   StrictlyIdeas.no  Fact Sheet for Healthcare Providers: BankingDealers.co.za  This test is not yet approved or  cleared by the [place] FDA and has been authorized for detection and/or diagnosis of SARS-CoV-2 by FDA under an Emergency Use Authorization (EUA).  This EUA will remain in effect (meaning this test can be used) for the duration of the COVID-19 declaration under Section 564(b)(1) of the Act, 21 U.S.C. section 360bbb-3(b)(1), unless the authorization is terminated or revoked sooner.  Performed at [facility], [place] [address] [place], [place] [address]          Radiology Studies: DG Swallowing Func-Speech Pathology  Result Date: 10/30/2019 Objective Swallowing Evaluation: Type of Study: MBS-Modified Barium Swallow Study  Patient Details Name: XAIDYN KEPNER MRN: [id number] Date of Birth: [birth year] Today's Date: 10/30/2019 Time: SLP  Start Time (ACUTE ONLY): 1310 -SLP Stop Time (ACUTE ONLY): 1340 SLP Time Calculation (min) (ACUTE ONLY): 30 min Past Medical History: Past Medical History: Diagnosis Date . Allergic rhinitis  . Anxiety  . Bladder cancer [facility])   urologist-  dr eskridge--  low grade TA . BPH with elevated PSA  . Complication of anesthesia   "paranoid after back surgery in october lasted 2 to 3 days" . DDD (degenerative disc disease), lumbar  . Depression  . Dry mouth   USES BIOTIN SPRAY/ MOUTHWASH . Frequency of urination  . GERD (gastroesophageal reflux disease)  . Hearing loss   does not wear his hearing aid . History of kidney stones   10/1989 . Hyperlipidemia  . Hypertension  . Mild obstructive sleep apnea   PER PT  MILD OSA , NO CPAP RX,  STUDY DONE 2009 . Nocturia  . OAB (overactive bladder)  . Osteoarthritis   KNEES, SHOULDER . Parkinson disease ([place]) DX   2005  NEUROLOGIST-   DR TAT--  IDIOPATHIC PARKINSON'S /  TREMORS CONTROLLED WITH MEDS . Urge urinary incontinence  Past Surgical History: Past Surgical History: Procedure Laterality Date . CATARACT EXTRACTION W/ INTRAOCULAR LENS  IMPLANT, BILATERAL  2014 . CYSTOSCOPY Bilateral 04/09/2017  Procedure: CYSTOSCOPY/ RETROGRADE;  Surgeon: Festus Aloe, MD;  Location: [facility];  Service: Urology;  Laterality: Bilateral; . CYSTOSCOPY W/ RETROGRADES Bilateral 12/14/2014  Procedure: CYSTOSCOPY BLADDER BIOPSY FULGERATION MITOMYCIN C BILATERAL RETROGRADE PYELOGRAM;  Surgeon: Festus Aloe, MD;  Location[facility];  Service: Urology;  Laterality: Bilateral; . CYSTOSCOPY WITH STENT PLACEMENT Left 12/19/2013  Procedure: CYSTOSCOPY BLADDER BX, LEFT URETERAL STENT PLACEMENT , LEFT RETROGRADE AND PYLOGRAM;  Surgeon: Festus Aloe, MD;  Location: [facility];  Service: Urology;  Laterality: Left; . EYE SURGERY Bilateral   ioc for cataract . HERNIA REPAIR  2013  inguinal . INGUINAL HERNIA REPAIR  03/09/2012  Procedure: HERNIA REPAIR INGUINAL ADULT;  Surgeon:  Adin Hector, MD;  Location: [facility];  Service: General;  Laterality: Right; . INSERTION OF MESH  03/09/2012  Procedure: INSERTION OF MESH;  Surgeon: Adin Hector, MD;  Location: [facility];  Service: General;  Laterality: Right; . LUMBAR LAMINECTOMY/DECOMPRESSION MICRODISCECTOMY N/A 02/05/2017  Procedure: Laminectomy and Foraminotomy - Lumbar  One-Two,Lumbar Two-Three, Lumbar Three-Four.;  Surgeon: Eustace Moore, MD;  Location: [facility];  Service: Neurosurgery;  Laterality: N/A; . NASAL SINUS SURGERY  1982 . ORIF RIGHT ARM FX  1949  HARDWARE REMOVED  . REMOVAL BENIGN CYST LEFT FOREHEAD  1969 . TRANSURETHRAL RESECTION OF BLADDER TUMOR N/A 12/19/2013  Procedure:  TRANSURETHRAL RESECTION OF BLADDER TUMOR WITH GYRUS (TURBT-GYRUS);  Surgeon: Festus Aloe, MD;  Location: [facility];  Service: Urology;  Laterality: N/A; . TRANSURETHRAL RESECTION OF BLADDER TUMOR N/A 04/09/2017  Procedure: TRANSURETHRAL RESECTION OF BLADDER TUMOR / (TURBT) 2-5cm;  Surgeon: Festus Aloe, MD;  Location: [facility];  Service: Urology;  Laterality: N/A;  ONLY NEEDS 90 MIN FOR BOTH PROCEDURES HPI: DAIMIEN PATMON is a 86 y.o. male with medical history significant of Parkinson's Disease, overactive bladder, osteoarthritis, depression, HTN, HLD, GERD, anxiety, BPH, h/o bladder cancer, DDD who presents after a fall. Mr. Hensley has been having a decline in function in the last few months with unsteadiness on his feet due to parkinson's disease as well as worsening dementia. In the ED, he was found to have acute mildly displaced fractures of the right L1-L4 transverse processes and a back brace was placed.  Further found to have minimally displaced fracture of the twelfth rib on the right and a non displaced fracture of the seventh lateral rib with probable non displaced fracture through the left sacral ala. CT head/cervical spine with no acute abnormality. In 2015, pt was evaluated for dysphagia after Parkinsons dx, but swallow was WNL  on MBS. Pt visited OP SLP for speech therapy.  No data recorded Assessment / Plan / Recommendation CHL IP CLINICAL IMPRESSIONS 10/30/2019 Clinical Impression  Pt demonstrates a profound pharyngeal, and likely chronic, dysphagia due to decreased bolus flow through the cervical esophagus. Pt has a small but significant bony protrusion on C 4/5 that visibly pushed again the opening of the UES. Pt has relatively good good pharyngeal peristalsis and laryngeal closure, but decreased hyoid excursion, epiglottic deflection and bolus propulsion. The tail fo the bolus does not transit through the UES and remains pooled in the pyriform sinuses. Residue is significant enough that aspiration occurs post swallow with all liquids tested. Pt does sense heavy tracheal aspirate and clears with a throat clear or cough, but it is repeatedly aspirated as he does not clear the residue from the pharynx. Pt is difficult to cue due to cognition; he could not achieve a chin tuck, would not attempt a head turn, and despite cueing for effortful swallows or ways to clear residue orally, he was not successful. Given that primary cause of dysphagia is anatomical, with a possible secondary component of weakness due to Parkinson's, this has likely been a chronic problem. Pt may tolerate aspiration, or may be slightly worse than baseline given his new injuries and impairment. Ongoing oral intake will need to accepted by family with known risk. Pt would be a poor candidate for alternate nutrition given mentation.   SLP Visit Diagnosis Dysphagia, pharyngeal phase (R13.13) Attention and concentration deficit following -- Frontal lobe and executive function deficit following -- Impact on safety and function Severe aspiration risk;Risk for inadequate nutrition/hydration   CHL IP TREATMENT RECOMMENDATION 10/30/2019 Treatment Recommendations Therapy as outlined in treatment plan below   Prognosis 10/30/2019 Prognosis for Safe Diet Advancement Guarded Barriers  to Reach Goals Cognitive deficits;Severity of deficits Barriers/Prognosis Comment -- CHL IP DIET RECOMMENDATION 10/30/2019 SLP Diet Recommendations Thin liquid;Dysphagia 2 (Fine chop) solids Liquid Administration via Cup  Medication Administration Crushed with puree Compensations Slow rate;Small sips/bites Postural Changes Seated upright at 90 degrees   CHL IP OTHER RECOMMENDATIONS 10/30/2019 Recommended Consults -- Oral Care Recommendations Oral care before and after PO Other Recommendations --   CHL IP FOLLOW UP RECOMMENDATIONS 10/30/2019 Follow up Recommendations Skilled Nursing facility;24 hour supervision/assistance   CHL IP FREQUENCY AND DURATION 10/30/2019 Speech Therapy Frequency (ACUTE ONLY) min 2x/week Treatment Duration 2 weeks      CHL IP ORAL PHASE 10/30/2019 Oral Phase WFL Oral - Pudding Teaspoon -- Oral - Pudding Cup -- Oral - Honey Teaspoon -- Oral - Honey Cup -- Oral - Nectar Teaspoon -- Oral - Nectar Cup -- Oral - Nectar Straw -- Oral - Thin Teaspoon -- Oral - Thin Cup -- Oral - Thin Straw -- Oral - Puree -- Oral - Mech Soft -- Oral - Regular -- Oral - Multi-Consistency -- Oral - Pill -- Oral Phase - Comment --  CHL IP PHARYNGEAL PHASE 10/30/2019 Pharyngeal Phase Impaired Pharyngeal- Pudding Teaspoon -- Pharyngeal -- Pharyngeal- Pudding Cup -- Pharyngeal -- Pharyngeal- Honey Teaspoon Penetration/Apiration after swallow;Pharyngeal residue - pyriform Pharyngeal Material enters airway, passes BELOW cords then ejected out Pharyngeal- Honey Cup -- Pharyngeal -- Pharyngeal- Nectar Teaspoon Penetration/Apiration after swallow;Pharyngeal residue - pyriform Pharyngeal Material enters airway, passes BELOW cords and not ejected out despite cough attempt by patient Pharyngeal- Nectar Cup Penetration/Apiration after swallow;Pharyngeal residue - pyriform Pharyngeal Material enters airway, passes BELOW cords and not ejected out despite cough attempt by patient Pharyngeal- Nectar Straw Penetration/Apiration after  swallow;Pharyngeal residue - pyriform Pharyngeal Material enters airway, passes BELOW cords and not ejected out despite cough attempt by patient Pharyngeal- Thin Teaspoon Penetration/Apiration after swallow;Pharyngeal residue - pyriform Pharyngeal Material enters airway, passes BELOW cords and not ejected out despite cough attempt by patient Pharyngeal- Thin Cup Penetration/Apiration after swallow;Pharyngeal residue - pyriform Pharyngeal Material enters airway, passes BELOW cords and not ejected out despite cough attempt by patient Pharyngeal- Thin Straw -- Pharyngeal -- Pharyngeal- Puree NT Pharyngeal -- Pharyngeal- Mechanical Soft -- Pharyngeal -- Pharyngeal- Regular -- Pharyngeal -- Pharyngeal- Multi-consistency -- Pharyngeal -- Pharyngeal- Pill -- Pharyngeal -- Pharyngeal Comment --  CHL IP CERVICAL ESOPHAGEAL PHASE 10/30/2019 Cervical Esophageal Phase Impaired Pudding Teaspoon -- Pudding Cup -- Honey Teaspoon -- Honey Cup -- Nectar Teaspoon -- Nectar Cup -- Nectar Straw -- Thin Teaspoon -- Thin Cup -- Thin Straw -- Puree -- Mechanical Soft -- Regular -- Multi-consistency -- Pill -- Cervical Esophageal Comment -- DeBlois, Katherene Ponto 10/30/2019, 3:07 PM                   Scheduled Meds: . carbidopa-levodopa  2 tablet Oral QID  . enoxaparin (LOVENOX) injection  40 mg Subcutaneous Q24H  . mirabegron ER  50 mg Oral q1600  . polyethylene glycol  17 g Oral Daily  . senna-docusate  1 tablet Oral BID  . sertraline  150 mg Oral Daily  . sodium chloride flush  3 mL Intravenous Q12H  . tamsulosin  0.4 mg Oral QPC supper   Continuous Infusions:   LOS: 3 days    Time spent: 30 minutes 30 minutes    Barb Merino, MD [facility] Pager [phone]

## 2019-11-02 DIAGNOSIS — R55 Syncope and collapse: Secondary | ICD-10-CM | POA: Diagnosis not present

## 2019-11-02 DIAGNOSIS — Z515 Encounter for palliative care: Secondary | ICD-10-CM | POA: Diagnosis not present

## 2019-11-02 DIAGNOSIS — F0280 Dementia in other diseases classified elsewhere without behavioral disturbance: Secondary | ICD-10-CM | POA: Diagnosis not present

## 2019-11-02 DIAGNOSIS — G20 Parkinson's disease: Secondary | ICD-10-CM | POA: Diagnosis not present

## 2019-11-02 LAB — GLUCOSE, CAPILLARY: Glucose-Capillary: 109 mg/dL — ABNORMAL HIGH (ref 70–99)

## 2019-11-02 NOTE — Progress Notes (Addendum)
  Speech Language Pathology Treatment: Dysphagia  Patient Details Name: [name] MRN: [id number] DOB: 1934-04-18 Today's Date: 11/02/2019 Time: 1205-1223 SLP Time Calculation (min) (ACUTE ONLY): 18 min  Assessment / Plan / Recommendation Clinical Impression  Skilled ST intervention for follow up at bedside with pt's wife and daughter. SLP facilitated session by reviewing importance of oral care several times a day to minimize bacterial load, and high aspiration risk with any po. Discouraged placing used swabs in water, to avoid buildup of bacteria. Wife/daughter were encouraged to provide po intake after oral care when pt is alert and responsive, seated upright. Use of ice chips may be beneficial after oral care for oral moisture and stimulation of swallowing musculature, to minimize risk of disuse atrophy, given minimal po intake. Family was encouraged to take advantage of more lucid moments to meet basic needs - pain, food/drink, toileting. ST will sign off at this time, having completed education. Please reconsult if needs arise.   HPI HPI: [name] is a 86 y.o. male with medical history significant of Parkinson's Disease, overactive bladder, osteoarthritis, depression, HTN, HLD, GERD, anxiety, BPH, h/o bladder cancer, DDD who presents after a fall. [name] has been having a decline in function in the last few months with unsteadiness on his feet due to parkinson's disease as well as worsening dementia. In the ED, he was found to have acute mildly displaced fractures of the right L1-L4 transverse processes and a back brace was placed.  Further found to have minimally displaced fracture of the twelfth rib on the right and a non displaced fracture of the seventh lateral rib with probable non displaced fracture through the left sacral ala. CT head/cervical spine with no acute abnormality. In 2015, pt was evaluated for dysphagia after Parkinsons dx, but swallow was WNL on MBS. Pt visited OP SLP for  speech therapy.       SLP Plan  Discharge SLP treatment due to education complete       Recommendations  Diet recommendations: Dysphagia 2 (fine chop);Thin liquid - family aware of high aspiration risk on any PO diet Liquids provided via: Cup;Straw Medication Administration: Crushed with puree Supervision: Staff to assist with self feeding;Full supervision/cueing for compensatory strategies Compensations: Slow rate;Small sips/bites;Minimize environmental distractions Postural Changes and/or Swallow Maneuvers: Seated upright 90 degrees;Upright 30-60 min after meal                Oral Care Recommendations: Oral care QID;Oral care prior to ice chip/H20;Staff/trained caregiver to provide oral care Follow up Recommendations: Skilled Nursing facility;24 hour supervision/assistance SLP Visit Diagnosis: Dysphagia, pharyngeal phase (R13.13) Plan: Discharge SLP treatment due to (comment)       [place] B. Quentin O[place], [facility], [place] Speech Language Pathologist Office: [phone]  Shonna Chock 11/02/2019, 12:25 PM

## 2019-11-02 NOTE — Discharge Summary (Signed)
Physician Discharge Summary  DOC MANDALA IOX:[id number] DOB: 1933/11/04 DOA: 10/28/2019  PCP: Colon Branch, MD  Admit date: 10/28/2019 Discharge date: 11/02/2019  Admitted From: Home Disposition: Inpatient hospice  Recommendations for Outpatient Follow-up:  1. As per hospice plan.  Discharge Condition: Fair CODE STATUS: DNR/DNI, comfort care Diet recommendation: Dysphagia 2 diet, thin liquids, aspiration precautions and comfort feeds  Discharge summary: [name] a 86 y.o.malewith medical history significant ofParkinson's Disease, overactive bladder, osteoarthritis, depression, HTN, HLD, GERD, anxiety, BPH, h/o bladder cancer, DDD who presents after a fall with recent deterioration of symptoms, decline in function and debility with poor appetite.  In the emergency room, hemodynamically stable.  L1-L4 transverse process fracture, right 12th rib and seventh lateral rib fracture.  Neurosurgery and trauma consulted.  Conservative management advised.  Mechanical fall with multiple fractures, history of Parkinson's ,autonomic dysfunction, failure to thrive, dysphagia, end-stage parkinson's and dementia: Patient remains in very poor clinical situation.  He has not been able to eat for some time.  Unable to keep up his nutrition and calorie intake.  Dysphagia and aspiration risk. Poor mobility. With his overall severe debility, end-stage dementia with Parkinson's, hospice was recommended and patient is being transferred to hospice facility to provide end-of-life care. All symptom control medications are available.  Further treatment plan as per hospice facility. At this time, since he is able to take medications with applesauce, will continue his carbidopa levodopa and other supportive treatments. DNR/DNI, comfort care Comfort feeding, supervised feeding and aspiration precautions.   Discharge Diagnoses:  Active Problems:   Hyperlipidemia   Depression   Essential hypertension    Osteoarthritis   Dementia due to Parkinson's disease without behavioral disturbance (HCC)   PD (Parkinson's disease) (HCC)   BPH (benign prostatic hyperplasia)   OAB (overactive bladder)   Syncope and collapse   Palliative care encounter    Discharge Instructions  Discharge Instructions    Diet general   Complete by: As directed    Comfort diet, dysphagia 2 diet with thin liquids.  Supervision and aspiration precautions.   Increase activity slowly   Complete by: As directed    No wound care   Complete by: As directed      Allergies as of 11/02/2019      Reactions   Pollen Extract Other (See Comments)   Other Other (See Comments)   Paranoia from anesthesia drug last visit-unknown drug       Medication List    TAKE these medications   ALPRAZolam 0.25 MG tablet Commonly known as: Xanax Take 1 tablet (0.25 mg total) by mouth 3 (three) times daily as needed for anxiety.   bacitracin ointment Apply 1 application topically 2 (two) times daily.   carbidopa-levodopa 50-200 MG tablet Commonly known as: SINEMET CR TAKE 1 TABLET BY MOUTH EVERYDAY AT BEDTIME What changed: See the new instructions.   carbidopa-levodopa 25-100 MG tablet Commonly known as: SINEMET IR TAKE TWO TABLETS BY MOUTH FOUR TIMES DAILY What changed: See the new instructions.   docusate sodium 50 MG capsule Commonly known as: COLACE Take 50-100 mg by mouth See admin instructions. Take 100 mg in the morning and 50 mg at 4pm   fluticasone 50 MCG/ACT nasal spray Commonly known as: FLONASE Place 2 sprays into both nostrils daily as needed for allergies or rhinitis.   ibuprofen 200 MG tablet Commonly known as: ADVIL Take 400 mg by mouth every 8 (eight) hours as needed for fever or mild pain.   multivitamin  with minerals Tabs tablet Take 1 tablet by mouth daily.   Myrbetriq 25 MG Tb24 tablet Generic drug: mirabegron ER Take 50 mg by mouth daily at 4 PM.   polyethylene glycol 17 g packet Commonly  known as: MIRALAX / GLYCOLAX Take 17 g by mouth daily as needed for mild constipation.   REFRESH OP Place 1-2 drops into both eyes daily as needed (dry eyes).   ROLAIDS PO Take 2 tablets by mouth as needed (indigestion).   sertraline 100 MG tablet Commonly known as: ZOLOFT Take 1.5 tablets (150 mg total) by mouth daily.   tamsulosin 0.4 MG Caps capsule Commonly known as: FLOMAX Take 0.4 mg by mouth daily after supper.       Allergies  Allergen Reactions  . Pollen Extract Other (See Comments)  . Other Other (See Comments)    Paranoia from anesthesia drug last visit-unknown drug     Consultations:  Palliative medicine  Hospice   Procedures/Studies: DG Chest 1 View  Result Date: 10/28/2019 CLINICAL DATA:  Status post fall. EXAM: CHEST  1 VIEW COMPARISON:  February 01, 2017 FINDINGS: There is no evidence of acute infiltrate, pleural effusion or pneumothorax. The heart size and mediastinal contours are within normal limits. There is moderate severity calcification of the aortic arch. An acute fracture of the seventh left rib is seen. IMPRESSION: Acute fracture of the seventh left rib. Electronically Signed   By: Virgina Norfolk M.D.   On: 10/28/2019 20:59   DG Pelvis 1-2 Views  Result Date: 10/28/2019 CLINICAL DATA:  Status post fall. EXAM: PELVIS - 1-2 VIEW COMPARISON:  None. FINDINGS: There is no evidence of pelvic fracture or diastasis. No pelvic bone lesions are seen. Mild degenerative changes seen within the bilateral hips. IMPRESSION: No acute osseous abnormality. Electronically Signed   By: Virgina Norfolk M.D.   On: 10/28/2019 21:05   CT HEAD WO CONTRAST  Result Date: 10/28/2019 CLINICAL DATA:  [place] downstairs, posterior scalp laceration, Parkinson's, dementia EXAM: CT HEAD WITHOUT CONTRAST CT CERVICAL SPINE WITHOUT CONTRAST TECHNIQUE: Multidetector CT imaging of the head and cervical spine was performed following the standard protocol without intravenous contrast.  Multiplanar CT image reconstructions of the cervical spine were also generated. COMPARISON:  07/18/2019 FINDINGS: CT HEAD FINDINGS Brain: Confluent areas of decreased attenuation throughout the periventricular white matter consistent with chronic small vessel ischemic changes, stable. No sign of acute infarct or hemorrhage. Lateral ventricles and remaining midline structures are unremarkable. No acute extra-axial fluid collections. No mass effect. Vascular: No hyperdense vessel or unexpected calcification. Skull: Normal. Negative for fracture or focal lesion. Sinuses/Orbits: Postsurgical changes bilateral maxillary sinuses. There is chronic mucosal thickening throughout the left maxillary sinus. Evidence of prior bilateral ethmoidectomies. Other: None. CT CERVICAL SPINE FINDINGS Alignment: Alignment is grossly anatomic. Skull base and vertebrae: No acute displaced fractures. Soft tissues and spinal canal: No prevertebral fluid or swelling. No visible canal hematoma. Disc levels: Mild multilevel spondylosis greatest at C4-5. There is diffuse facet hypertrophy. There is right predominant neural foraminal encroachment at the C4/C5 level. Upper chest: Airway is patent. Lung apices are clear. Other: Reconstructed images demonstrate no additional findings. IMPRESSION: 1. No acute intracranial process. Stable chronic small-vessel ischemic changes throughout the white matter. 2. No acute cervical spine fracture. Electronically Signed   By: Randa Ngo M.D.   On: 10/28/2019 21:33   CT CHEST W CONTRAST  Result Date: 10/28/2019 CLINICAL DATA:  Acute pain due to trauma EXAM: CT CHEST, ABDOMEN, AND PELVIS WITH CONTRAST  CT LUMBAR SPINE WITHOUT CONTRAST TECHNIQUE: Multidetector CT imaging of the chest, abdomen and pelvis was performed following the standard protocol during bolus administration of intravenous contrast. CONTRAST:  128mL OMNIPAQUE IOHEXOL 350 MG/ML SOLN COMPARISON:  CT L-spine dated November 14, 2009. CT of the  pelvis dated November 15, 2013. FINDINGS: CT CHEST FINDINGS Cardiovascular: The heart size is normal. There are atherosclerotic changes of the thoracic aorta without evidence for dissection or aneurysm. There are coronary artery calcifications. There is no significant pericardial effusion. There is no large centrally located pulmonary embolism. Mediastinum/Nodes: -- No mediastinal lymphadenopathy. -- No hilar lymphadenopathy. -- No axillary lymphadenopathy. -- No supraclavicular lymphadenopathy. -- Normal thyroid gland where visualized. -  Unremarkable esophagus. Lungs/Pleura: There are trace bilateral pleural effusions. There is no pneumothorax. There is no focal infiltrate. The trachea is unremarkable. Musculoskeletal: There is an acute minimally displaced fracture of the rudimentary twelfth rib on the right. There may be a nondisplaced buckle fracture of the twelfth rib on the left.There is a nondisplaced fracture of the lateral seventh rib. There are advanced degenerative changes of the bilateral glenohumeral joints. CT ABDOMEN PELVIS FINDINGS Hepatobiliary: There is a cyst in the left hepatic lobe. Cholelithiasis without acute inflammation.There is no biliary ductal dilation. Pancreas: Normal contours without ductal dilatation. No peripancreatic fluid collection. Spleen: Unremarkable. Adrenals/Urinary Tract: --Adrenal glands: Unremarkable. --Right kidney/ureter: No hydronephrosis or radiopaque kidney stones. --Left kidney/ureter: There is a nonobstructing stone in the upper pole the left kidney. --Urinary bladder: Unremarkable. Stomach/Bowel: --Stomach/Duodenum: No hiatal hernia or other gastric abnormality. Normal duodenal course and caliber. --Small bowel: Unremarkable. --Colon: There is a large amount of stool in the rectum. --Appendix: Not visualized. No right lower quadrant inflammation or free fluid. Vascular/Lymphatic: Atherosclerotic calcification is present within the non-aneurysmal abdominal aorta,  without hemodynamically significant stenosis. --No retroperitoneal lymphadenopathy. --No mesenteric lymphadenopathy. --No pelvic or inguinal lymphadenopathy. Reproductive: The prostate gland is enlarged. Other: No ascites or free air. The abdominal wall is normal. Musculoskeletal. There are acute mildly displaced fractures of the right L1 through L4 transverse processes. Degenerative changes are noted throughout the lumbar spine. 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 There is mild anterolisthesis of L5 on S1. There are degenerative changes throughout the visualized lumbar spine, greatest at the L1-L2 and L2-L3 levels. Facet arthrosis is noted. There is a probable nondisplaced fracture through the left sacral ala (axial series 4, image 71 on the lumbar reconstructions). IMPRESSION: 1. Acute mildly displaced fractures of the right L1 through L4 transverse processes. 2. There is a minimally displaced fracture of the posterior twelfth rib on the right. There is a nondisplaced fracture of the left lateral seventh rib. There is no pneumothorax. 3. Probable nondisplaced fracture through the left sacral ala. 4. Trace bilateral pleural effusions. 5. Cholelithiasis without acute inflammation. 6. Nonobstructive left nephrolithiasis. 7. Prostatomegaly. 8. Large amount of stool in the rectum. Aortic Atherosclerosis (ICD10-I70.0). Electronically Signed   By: Constance Holster M.D.   On: 10/28/2019 21:38   CT CERVICAL SPINE WO CONTRAST  Result Date: 10/28/2019 CLINICAL DATA:  [place] downstairs, posterior scalp laceration, Parkinson's, dementia EXAM: CT HEAD WITHOUT CONTRAST CT CERVICAL SPINE WITHOUT CONTRAST TECHNIQUE: Multidetector CT imaging of the head and cervical spine was performed following the standard protocol without intravenous contrast. Multiplanar CT image reconstructions of the cervical spine were also generated. COMPARISON:  07/18/2019 FINDINGS: CT HEAD FINDINGS Brain: Confluent areas of  decreased attenuation throughout the periventricular white matter consistent with chronic small vessel ischemic changes,  stable. No sign of acute infarct or hemorrhage. Lateral ventricles and remaining midline structures are unremarkable. No acute extra-axial fluid collections. No mass effect. Vascular: No hyperdense vessel or unexpected calcification. Skull: Normal. Negative for fracture or focal lesion. Sinuses/Orbits: Postsurgical changes bilateral maxillary sinuses. There is chronic mucosal thickening throughout the left maxillary sinus. Evidence of prior bilateral ethmoidectomies. Other: None. CT CERVICAL SPINE FINDINGS Alignment: Alignment is grossly anatomic. Skull base and vertebrae: No acute displaced fractures. Soft tissues and spinal canal: No prevertebral fluid or swelling. No visible canal hematoma. Disc levels: Mild multilevel spondylosis greatest at C4-5. There is diffuse facet hypertrophy. There is right predominant neural foraminal encroachment at the C4/C5 level. Upper chest: Airway is patent. Lung apices are clear. Other: Reconstructed images demonstrate no additional findings. IMPRESSION: 1. No acute intracranial process. Stable chronic small-vessel ischemic changes throughout the white matter. 2. No acute cervical spine fracture. Electronically Signed   By: Randa Ngo M.D.   On: 10/28/2019 21:33   CT ABDOMEN PELVIS W CONTRAST  Result Date: 10/28/2019 CLINICAL DATA:  Acute pain due to trauma EXAM: CT CHEST, ABDOMEN, AND PELVIS WITH CONTRAST CT LUMBAR SPINE WITHOUT CONTRAST TECHNIQUE: Multidetector CT imaging of the chest, abdomen and pelvis was performed following the standard protocol during bolus administration of intravenous contrast. CONTRAST:  187mL OMNIPAQUE IOHEXOL 350 MG/ML SOLN COMPARISON:  CT L-spine dated November 14, 2009. CT of the pelvis dated November 15, 2013. FINDINGS: CT CHEST FINDINGS Cardiovascular: The heart size is normal. There are atherosclerotic changes of the thoracic  aorta without evidence for dissection or aneurysm. There are coronary artery calcifications. There is no significant pericardial effusion. There is no large centrally located pulmonary embolism. Mediastinum/Nodes: -- No mediastinal lymphadenopathy. -- No hilar lymphadenopathy. -- No axillary lymphadenopathy. -- No supraclavicular lymphadenopathy. -- Normal thyroid gland where visualized. -  Unremarkable esophagus. Lungs/Pleura: There are trace bilateral pleural effusions. There is no pneumothorax. There is no focal infiltrate. The trachea is unremarkable. Musculoskeletal: There is an acute minimally displaced fracture of the rudimentary twelfth rib on the right. There may be a nondisplaced buckle fracture of the twelfth rib on the left.There is a nondisplaced fracture of the lateral seventh rib. There are advanced degenerative changes of the bilateral glenohumeral joints. CT ABDOMEN PELVIS FINDINGS Hepatobiliary: There is a cyst in the left hepatic lobe. Cholelithiasis without acute inflammation.There is no biliary ductal dilation. Pancreas: Normal contours without ductal dilatation. No peripancreatic fluid collection. Spleen: Unremarkable. Adrenals/Urinary Tract: --Adrenal glands: Unremarkable. --Right kidney/ureter: No hydronephrosis or radiopaque kidney stones. --Left kidney/ureter: There is a nonobstructing stone in the upper pole the left kidney. --Urinary bladder: Unremarkable. Stomach/Bowel: --Stomach/Duodenum: No hiatal hernia or other gastric abnormality. Normal duodenal course and caliber. --Small bowel: Unremarkable. --Colon: There is a large amount of stool in the rectum. --Appendix: Not visualized. No right lower quadrant inflammation or free fluid. Vascular/Lymphatic: Atherosclerotic calcification is present within the non-aneurysmal abdominal aorta, without hemodynamically significant stenosis. --No retroperitoneal lymphadenopathy. --No mesenteric lymphadenopathy. --No pelvic or inguinal  lymphadenopathy. Reproductive: The prostate gland is enlarged. Other: No ascites or free air. The abdominal wall is normal. Musculoskeletal. There are acute mildly displaced fractures of the right L1 through L4 transverse processes. Degenerative changes are noted throughout the lumbar spine. 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 There is mild anterolisthesis of L5 on S1. There are degenerative changes throughout the visualized lumbar spine, greatest at the L1-L2 and L2-L3 levels. Facet arthrosis is noted. There is a probable nondisplaced fracture  through the left sacral ala (axial series 4, image 71 on the lumbar reconstructions). IMPRESSION: 1. Acute mildly displaced fractures of the right L1 through L4 transverse processes. 2. There is a minimally displaced fracture of the posterior twelfth rib on the right. There is a nondisplaced fracture of the left lateral seventh rib. There is no pneumothorax. 3. Probable nondisplaced fracture through the left sacral ala. 4. Trace bilateral pleural effusions. 5. Cholelithiasis without acute inflammation. 6. Nonobstructive left nephrolithiasis. 7. Prostatomegaly. 8. Large amount of stool in the rectum. Aortic Atherosclerosis (ICD10-I70.0). Electronically Signed   By: Constance Holster M.D.   On: 10/28/2019 21:38   CT L-SPINE NO CHARGE  Result Date: 10/28/2019 CLINICAL DATA:  Acute pain due to trauma EXAM: CT CHEST, ABDOMEN, AND PELVIS WITH CONTRAST CT LUMBAR SPINE WITHOUT CONTRAST TECHNIQUE: Multidetector CT imaging of the chest, abdomen and pelvis was performed following the standard protocol during bolus administration of intravenous contrast. CONTRAST:  135mL OMNIPAQUE IOHEXOL 350 MG/ML SOLN COMPARISON:  CT L-spine dated November 14, 2009. CT of the pelvis dated November 15, 2013. FINDINGS: CT CHEST FINDINGS Cardiovascular: The heart size is normal. There are atherosclerotic changes of the thoracic aorta without evidence for dissection or aneurysm. There are  coronary artery calcifications. There is no significant pericardial effusion. There is no large centrally located pulmonary embolism. Mediastinum/Nodes: -- No mediastinal lymphadenopathy. -- No hilar lymphadenopathy. -- No axillary lymphadenopathy. -- No supraclavicular lymphadenopathy. -- Normal thyroid gland where visualized. -  Unremarkable esophagus. Lungs/Pleura: There are trace bilateral pleural effusions. There is no pneumothorax. There is no focal infiltrate. The trachea is unremarkable. Musculoskeletal: There is an acute minimally displaced fracture of the rudimentary twelfth rib on the right. There may be a nondisplaced buckle fracture of the twelfth rib on the left.There is a nondisplaced fracture of the lateral seventh rib. There are advanced degenerative changes of the bilateral glenohumeral joints. CT ABDOMEN PELVIS FINDINGS Hepatobiliary: There is a cyst in the left hepatic lobe. Cholelithiasis without acute inflammation.There is no biliary ductal dilation. Pancreas: Normal contours without ductal dilatation. No peripancreatic fluid collection. Spleen: Unremarkable. Adrenals/Urinary Tract: --Adrenal glands: Unremarkable. --Right kidney/ureter: No hydronephrosis or radiopaque kidney stones. --Left kidney/ureter: There is a nonobstructing stone in the upper pole the left kidney. --Urinary bladder: Unremarkable. Stomach/Bowel: --Stomach/Duodenum: No hiatal hernia or other gastric abnormality. Normal duodenal course and caliber. --Small bowel: Unremarkable. --Colon: There is a large amount of stool in the rectum. --Appendix: Not visualized. No right lower quadrant inflammation or free fluid. Vascular/Lymphatic: Atherosclerotic calcification is present within the non-aneurysmal abdominal aorta, without hemodynamically significant stenosis. --No retroperitoneal lymphadenopathy. --No mesenteric lymphadenopathy. --No pelvic or inguinal lymphadenopathy. Reproductive: The prostate gland is enlarged. Other: No  ascites or free air. The abdominal wall is normal. Musculoskeletal. There are acute mildly displaced fractures of the right L1 through L4 transverse processes. Degenerative changes are noted throughout the lumbar spine. 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 There is mild anterolisthesis of L5 on S1. There are degenerative changes throughout the visualized lumbar spine, greatest at the L1-L2 and L2-L3 levels. Facet arthrosis is noted. There is a probable nondisplaced fracture through the left sacral ala (axial series 4, image 71 on the lumbar reconstructions). IMPRESSION: 1. Acute mildly displaced fractures of the right L1 through L4 transverse processes. 2. There is a minimally displaced fracture of the posterior twelfth rib on the right. There is a nondisplaced fracture of the left lateral seventh rib. There is no pneumothorax. 3. Probable nondisplaced  fracture through the left sacral ala. 4. Trace bilateral pleural effusions. 5. Cholelithiasis without acute inflammation. 6. Nonobstructive left nephrolithiasis. 7. Prostatomegaly. 8. Large amount of stool in the rectum. Aortic Atherosclerosis (ICD10-I70.0). Electronically Signed   By: Constance Holster M.D.   On: 10/28/2019 21:38   DG Knee Complete 4 Views Left  Result Date: 10/28/2019 CLINICAL DATA:  Status post fall. EXAM: LEFT KNEE - COMPLETE 4+ VIEW COMPARISON:  None. FINDINGS: No evidence of acute fracture or dislocation. Mild medial and lateral tibiofemoral compartment space narrowing is seen. Moderate to marked severity vascular calcification is noted. A very small joint effusion is present. IMPRESSION: 1. No evidence of acute fracture or dislocation. 2. Very small joint effusion. Electronically Signed   By: Virgina Norfolk M.D.   On: 10/28/2019 21:04   DG Swallowing Func-Speech Pathology  Result Date: 10/30/2019 Objective Swallowing Evaluation: Type of Study: MBS-Modified Barium Swallow Study  Patient Details Name: [name]  MRN: [id number] Date of Birth: January 12, 1934 Today's Date: 10/30/2019 Time: SLP Start Time (ACUTE ONLY): 1310 -SLP Stop Time (ACUTE ONLY): 1340 SLP Time Calculation (min) (ACUTE ONLY): 30 min Past Medical History: Past Medical History: Diagnosis Date . Allergic rhinitis  . Anxiety  . Bladder cancer [facility])   urologist-  dr eskridge--  low grade TA . BPH with elevated PSA  . Complication of anesthesia   "paranoid after back surgery in october lasted 2 to 3 days" . DDD (degenerative disc disease), lumbar  . Depression  . Dry mouth   USES BIOTIN SPRAY/ MOUTHWASH . Frequency of urination  . GERD (gastroesophageal reflux disease)  . Hearing loss   does not wear his hearing aid . History of kidney stones   10/1989 . Hyperlipidemia  . Hypertension  . Mild obstructive sleep apnea   PER PT  MILD OSA , NO CPAP RX,  STUDY DONE 2009 . Nocturia  . OAB (overactive bladder)  . Osteoarthritis   KNEES, SHOULDER . Parkinson disease ([place]) DX   2005  NEUROLOGIST-   DR TAT--  IDIOPATHIC PARKINSON'S /  TREMORS CONTROLLED WITH MEDS . Urge urinary incontinence  Past Surgical History: Past Surgical History: Procedure Laterality Date . CATARACT EXTRACTION W/ INTRAOCULAR LENS  IMPLANT, BILATERAL  2014 . CYSTOSCOPY Bilateral 04/09/2017  Procedure: CYSTOSCOPY/ RETROGRADE;  Surgeon: Festus Aloe, MD;  Location: [facility];  Service: Urology;  Laterality: Bilateral; . CYSTOSCOPY W/ RETROGRADES Bilateral 12/14/2014  Procedure: CYSTOSCOPY BLADDER BIOPSY FULGERATION MITOMYCIN C BILATERAL RETROGRADE PYELOGRAM;  Surgeon: Festus Aloe, MD;  Location[facility];  Service: Urology;  Laterality: Bilateral; . CYSTOSCOPY WITH STENT PLACEMENT Left 12/19/2013  Procedure: CYSTOSCOPY BLADDER BX, LEFT URETERAL STENT PLACEMENT , LEFT RETROGRADE AND PYLOGRAM;  Surgeon: Festus Aloe, MD;  Location: [facility];  Service: Urology;  Laterality: Left; . EYE SURGERY Bilateral   ioc for cataract . HERNIA REPAIR  2013  inguinal . INGUINAL  HERNIA REPAIR  03/09/2012  Procedure: HERNIA REPAIR INGUINAL ADULT;  Surgeon: Adin Hector, MD;  Location: [facility];  Service: General;  Laterality: Right; . INSERTION OF MESH  03/09/2012  Procedure: INSERTION OF MESH;  Surgeon: Adin Hector, MD;  Location: [facility];  Service: General;  Laterality: Right; . LUMBAR LAMINECTOMY/DECOMPRESSION MICRODISCECTOMY N/A 02/05/2017  Procedure: Laminectomy and Foraminotomy - Lumbar One-Two,Lumbar Two-Three, Lumbar Three-Four.;  Surgeon: Eustace Moore, MD;  Location: [place];  Service: Neurosurgery;  Laterality: N/A; . NASAL SINUS SURGERY  1982 . ORIF RIGHT ARM FX  1949  HARDWARE REMOVED  .  REMOVAL BENIGN CYST LEFT FOREHEAD  1969 . TRANSURETHRAL RESECTION OF BLADDER TUMOR N/A 12/19/2013  Procedure:  TRANSURETHRAL RESECTION OF BLADDER TUMOR WITH GYRUS (TURBT-GYRUS);  Surgeon: Festus Aloe, MD;  Location[facility];  Service: Urology;  Laterality: N/A; . TRANSURETHRAL RESECTION OF BLADDER TUMOR N/A 04/09/2017  Procedure: TRANSURETHRAL RESECTION OF BLADDER TUMOR / (TURBT) 2-5cm;  Surgeon: Festus Aloe, MD;  Location: [facility];  Service: Urology;  Laterality: N/A;  ONLY NEEDS 90 MIN FOR BOTH PROCEDURES HPI: [name] is a 86 y.o. male with medical history significant of Parkinson's Disease, overactive bladder, osteoarthritis, depression, HTN, HLD, GERD, anxiety, BPH, h/o bladder cancer, DDD who presents after a fall. Mr. Pound has been having a decline in function in the last few months with unsteadiness on his feet due to parkinson's disease as well as worsening dementia. In the ED, he was found to have acute mildly displaced fractures of the right L1-L4 transverse processes and a back brace was placed.  Further found to have minimally displaced fracture of the twelfth rib on the right and a non displaced fracture of the seventh lateral rib with probable non displaced fracture through the left sacral ala. CT head/cervical spine with no acute abnormality. In  2015, pt was evaluated for dysphagia after Parkinsons dx, but swallow was WNL on MBS. Pt visited OP SLP for speech therapy.  No data recorded Assessment / Plan / Recommendation CHL IP CLINICAL IMPRESSIONS 10/30/2019 Clinical Impression  Pt demonstrates a profound pharyngeal, and likely chronic, dysphagia due to decreased bolus flow through the cervical esophagus. Pt has a small but significant bony protrusion on C 4/5 that visibly pushed again the opening of the UES. Pt has relatively good good pharyngeal peristalsis and laryngeal closure, but decreased hyoid excursion, epiglottic deflection and bolus propulsion. The tail fo the bolus does not transit through the UES and remains pooled in the pyriform sinuses. Residue is significant enough that aspiration occurs post swallow with all liquids tested. Pt does sense heavy tracheal aspirate and clears with a throat clear or cough, but it is repeatedly aspirated as he does not clear the residue from the pharynx. Pt is difficult to cue due to cognition; he could not achieve a chin tuck, would not attempt a head turn, and despite cueing for effortful swallows or ways to clear residue orally, he was not successful. Given that primary cause of dysphagia is anatomical, with a possible secondary component of weakness due to Parkinson's, this has likely been a chronic problem. Pt may tolerate aspiration, or may be slightly worse than baseline given his new injuries and impairment. Ongoing oral intake will need to accepted by family with known risk. Pt would be a poor candidate for alternate nutrition given mentation.   SLP Visit Diagnosis Dysphagia, pharyngeal phase (R13.13) Attention and concentration deficit following -- Frontal lobe and executive function deficit following -- Impact on safety and function Severe aspiration risk;Risk for inadequate nutrition/hydration   CHL IP TREATMENT RECOMMENDATION 10/30/2019 Treatment Recommendations Therapy as outlined in treatment plan  below   Prognosis 10/30/2019 Prognosis for Safe Diet Advancement Guarded Barriers to Reach Goals Cognitive deficits;Severity of deficits Barriers/Prognosis Comment -- CHL IP DIET RECOMMENDATION 10/30/2019 SLP Diet Recommendations Thin liquid;Dysphagia 2 (Fine chop) solids Liquid Administration via Cup Medication Administration Crushed with puree Compensations Slow rate;Small sips/bites Postural Changes Seated upright at 90 degrees   CHL IP OTHER RECOMMENDATIONS 10/30/2019 Recommended Consults -- Oral Care Recommendations Oral care before and after PO Other Recommendations --  CHL IP FOLLOW UP RECOMMENDATIONS 10/30/2019 Follow up Recommendations Skilled Nursing facility;24 hour supervision/assistance   CHL IP FREQUENCY AND DURATION 10/30/2019 Speech Therapy Frequency (ACUTE ONLY) min 2x/week Treatment Duration 2 weeks      CHL IP ORAL PHASE 10/30/2019 Oral Phase WFL Oral - Pudding Teaspoon -- Oral - Pudding Cup -- Oral - Honey Teaspoon -- Oral - Honey Cup -- Oral - Nectar Teaspoon -- Oral - Nectar Cup -- Oral - Nectar Straw -- Oral - Thin Teaspoon -- Oral - Thin Cup -- Oral - Thin Straw -- Oral - Puree -- Oral - Mech Soft -- Oral - Regular -- Oral - Multi-Consistency -- Oral - Pill -- Oral Phase - Comment --  CHL IP PHARYNGEAL PHASE 10/30/2019 Pharyngeal Phase Impaired Pharyngeal- Pudding Teaspoon -- Pharyngeal -- Pharyngeal- Pudding Cup -- Pharyngeal -- Pharyngeal- Honey Teaspoon Penetration/Apiration after swallow;Pharyngeal residue - pyriform Pharyngeal Material enters airway, passes BELOW cords then ejected out Pharyngeal- Honey Cup -- Pharyngeal -- Pharyngeal- Nectar Teaspoon Penetration/Apiration after swallow;Pharyngeal residue - pyriform Pharyngeal Material enters airway, passes BELOW cords and not ejected out despite cough attempt by patient Pharyngeal- Nectar Cup Penetration/Apiration after swallow;Pharyngeal residue - pyriform Pharyngeal Material enters airway, passes BELOW cords and not ejected out despite  cough attempt by patient Pharyngeal- Nectar Straw Penetration/Apiration after swallow;Pharyngeal residue - pyriform Pharyngeal Material enters airway, passes BELOW cords and not ejected out despite cough attempt by patient Pharyngeal- Thin Teaspoon Penetration/Apiration after swallow;Pharyngeal residue - pyriform Pharyngeal Material enters airway, passes BELOW cords and not ejected out despite cough attempt by patient Pharyngeal- Thin Cup Penetration/Apiration after swallow;Pharyngeal residue - pyriform Pharyngeal Material enters airway, passes BELOW cords and not ejected out despite cough attempt by patient Pharyngeal- Thin Straw -- Pharyngeal -- Pharyngeal- Puree NT Pharyngeal -- Pharyngeal- Mechanical Soft -- Pharyngeal -- Pharyngeal- Regular -- Pharyngeal -- Pharyngeal- Multi-consistency -- Pharyngeal -- Pharyngeal- Pill -- Pharyngeal -- Pharyngeal Comment --  CHL IP CERVICAL ESOPHAGEAL PHASE 10/30/2019 Cervical Esophageal Phase Impaired Pudding Teaspoon -- Pudding Cup -- Honey Teaspoon -- Honey Cup -- Nectar Teaspoon -- Nectar Cup -- Nectar Straw -- Thin Teaspoon -- Thin Cup -- Thin Straw -- Puree -- Mechanical Soft -- Regular -- Multi-consistency -- Pill -- Cervical Esophageal Comment -- DeBlois, Katherene Ponto 10/30/2019, 3:07 PM              DG FEMUR MIN 2 VIEWS LEFT  Result Date: 10/28/2019 CLINICAL DATA:  Status post fall. EXAM: LEFT FEMUR 2 VIEWS COMPARISON:  None. FINDINGS: There is no evidence of fracture or other focal bone lesions. Moderate severity vascular calcification is seen. IMPRESSION: No acute osseous abnormality. Electronically Signed   By: Virgina Norfolk M.D.   On: 10/28/2019 21:01   DG FEMUR, MIN 2 VIEWS RIGHT  Result Date: 10/28/2019 CLINICAL DATA:  Status post fall. EXAM: RIGHT FEMUR 2 VIEWS COMPARISON:  None. FINDINGS: There is no evidence of fracture or other focal bone lesions. There is moderate to marked severity vascular calcification. IMPRESSION: 1. No acute osseous  abnormality. Electronically Signed   By: Virgina Norfolk M.D.   On: 10/28/2019 21:02   (Echo, Carotid, EGD, Colonoscopy, ERCP)    Subjective: Patient seen and examined.  No overnight events.  Remains pretty lethargic, keeps eyes closed and does not interact much.  Nursing reported maximum encouragement to take few bites of food and to take medications.   Discharge Exam: Vitals:   11/02/19 0235 11/02/19 0922  BP: (!) 116/58 (!) 161/89  Pulse: 70 77  Resp: 18 20  Temp: 97.8 F (36.6 C) 97.9 F (36.6 C)  SpO2: 94% 98%   Vitals:   11/01/19 1303 11/01/19 2000 11/02/19 0235 11/02/19 0922  BP: (!) 151/78 (!) 144/70 (!) 116/58 (!) 161/89  Pulse: 83 71 70 77  Resp: 17 16 18 20   Temp: (!) 97.2 F (36.2 C) 98.1 F (36.7 C) 97.8 F (36.6 C) 97.9 F (36.6 C)  TempSrc: Oral Axillary Axillary Oral  SpO2:  94% 94% 98%    General: Pt is sick looking, frail and apathetic. Cardiovascular: RRR, S1/S2 +, no rubs, no gallops Respiratory: CTA bilaterally, no wheezing, no rhonchi, some conducted airway sounds. Abdominal: Soft, NT, ND, bowel sounds + Extremities: no edema, no cyanosis Moves all extremities.  Generalized weakness all over.    The results of significant diagnostics from this hospitalization (including imaging, microbiology, ancillary and laboratory) are listed below for reference.     Microbiology: Recent Results (from the past 240 hour(s))  SARS Coronavirus 2 by RT PCR (hospital order, performed in [facility] hospital lab) Nasopharyngeal Nasopharyngeal Swab     Status: None   Collection Time: 10/28/19 10:39 PM   Specimen: Nasopharyngeal Swab  Result Value Ref Range Status   SARS Coronavirus 2 NEGATIVE NEGATIVE Final    Comment: (NOTE) SARS-CoV-2 target nucleic acids are NOT DETECTED.  The SARS-CoV-2 RNA is generally detectable in upper and lower respiratory specimens during the acute phase of infection. The lowest concentration of SARS-CoV-2 viral copies this assay  can detect is 250 copies / mL. A negative result does not preclude SARS-CoV-2 infection and should not be used as the sole basis for treatment or other patient management decisions.  A negative result may occur with improper specimen collection / handling, submission of specimen other than nasopharyngeal swab, presence of viral mutation(s) within the areas targeted by this assay, and inadequate number of viral copies (<250 copies / mL). A negative result must be combined with clinical observations, patient history, and epidemiological information.  Fact Sheet for Patients:   StrictlyIdeas.no  Fact Sheet for Healthcare Providers: BankingDealers.co.za  This test is not yet approved or  cleared by the [place] FDA and has been authorized for detection and/or diagnosis of SARS-CoV-2 by FDA under an Emergency Use Authorization (EUA).  This EUA will remain in effect (meaning this test can be used) for the duration of the COVID-19 declaration under Section 564(b)(1) of the Act, 21 U.S.C. section 360bbb-3(b)(1), unless the authorization is terminated or revoked sooner.  Performed at [facility], [place] [address] [place], [place] [address]      Labs: BNP (last 3 results) No results for input(s): BNP in the last 8760 hours. Basic Metabolic Panel: Recent Labs  Lab 10/28/19 1945 10/28/19 1945 10/28/19 2037 10/29/19 1522 10/30/19 0327 10/31/19 0341 11/01/19 0132  NA 141  --  141  --  139 140 139  K 4.3  --  3.7  --  4.0 3.4* 3.9  CL 106  --  102  --  106 105 108  CO2 28  --   --   --  25 23 23   GLUCOSE 106*  --  100*  --  127* 103* 107*  BUN 19  --  20  --  21 21 20   CREATININE 0.99   < > 0.90 0.96 1.12 0.98 0.85  CALCIUM 9.1  --   --   --  8.8* 8.9 8.8*   < > = values in this interval  not displayed.   Liver Function Tests: Recent Labs  Lab 10/28/19 1945  AST 17  ALT 6  ALKPHOS 94  BILITOT 1.4*  PROT 6.3*   ALBUMIN 3.9   No results for input(s): LIPASE, AMYLASE in the last 168 hours. No results for input(s): AMMONIA in the last 168 hours. CBC: Recent Labs  Lab 10/28/19 1945 10/28/19 2037 10/29/19 1025 10/29/19 1522 10/30/19 0327 10/31/19 0341 11/01/19 0132  WBC 14.2*   < > 13.8* 13.6* 9.5 9.7 7.7  NEUTROABS 12.1*  --   --   --  7.6 7.7 5.7  HGB 13.1   < > 12.6* 12.8* 10.8* 10.5* 10.1*  HCT 40.3   < > 38.8* 38.6* 33.5* 32.3* 31.7*  MCV 97.3   < > 96.0 95.8 96.3 96.4 96.4  PLT 209   < > 239 251 179 152 166   < > = values in this interval not displayed.   Cardiac Enzymes: No results for input(s): CKTOTAL, CKMB, CKMBINDEX, TROPONINI in the last 168 hours. BNP: Invalid input(s): POCBNP CBG: Recent Labs  Lab 10/28/19 2142 10/30/19 0654 11/01/19 0737 11/02/19 0753  GLUCAP 81 101* 103* 109*   D-Dimer No results for input(s): DDIMER in the last 72 hours. Hgb A1c No results for input(s): HGBA1C in the last 72 hours. Lipid Profile No results for input(s): CHOL, HDL, LDLCALC, TRIG, CHOLHDL, LDLDIRECT in the last 72 hours. Thyroid function studies No results for input(s): TSH, T4TOTAL, T3FREE, THYROIDAB in the last 72 hours.  Invalid input(s): FREET3 Anemia work up No results for input(s): VITAMINB12, FOLATE, FERRITIN, TIBC, IRON, RETICCTPCT in the last 72 hours. Urinalysis    Component Value Date/Time   COLORURINE YELLOW 10/28/2019 2318   APPEARANCEUR CLEAR 10/28/2019 2318   LABSPEC 1.038 (H) 10/28/2019 2318   PHURINE 5.0 10/28/2019 2318   GLUCOSEU NEGATIVE 10/28/2019 2318   GLUCOSEU NEGATIVE 08/23/2019 1017   HGBUR SMALL (A) 10/28/2019 2318   HGBUR negative 05/22/2009 0911   BILIRUBINUR NEGATIVE 10/28/2019 2318   BILIRUBINUR neg 03/18/2011 0903   KETONESUR 5 (A) 10/28/2019 2318   PROTEINUR NEGATIVE 10/28/2019 2318   UROBILINOGEN 0.2 08/23/2019 1017   NITRITE NEGATIVE 10/28/2019 2318   LEUKOCYTESUR NEGATIVE 10/28/2019 2318   Sepsis Labs Invalid input(s):  PROCALCITONIN,  WBC,  LACTICIDVEN Microbiology Recent Results (from the past 240 hour(s))  SARS Coronavirus 2 by RT PCR (hospital order, performed in [place] hospital lab) Nasopharyngeal Nasopharyngeal Swab     Status: None   Collection Time: 10/28/19 10:39 PM   Specimen: Nasopharyngeal Swab  Result Value Ref Range Status   SARS Coronavirus 2 NEGATIVE NEGATIVE Final    Comment: (NOTE) SARS-CoV-2 target nucleic acids are NOT DETECTED.  The SARS-CoV-2 RNA is generally detectable in upper and lower respiratory specimens during the acute phase of infection. The lowest concentration of SARS-CoV-2 viral copies this assay can detect is 250 copies / mL. A negative result does not preclude SARS-CoV-2 infection and should not be used as the sole basis for treatment or other patient management decisions.  A negative result may occur with improper specimen collection / handling, submission of specimen other than nasopharyngeal swab, presence of viral mutation(s) within the areas targeted by this assay, and inadequate number of viral copies (<250 copies / mL). A negative result must be combined with clinical observations, patient history, and epidemiological information.  Fact Sheet for Patients:   StrictlyIdeas.no  Fact Sheet for Healthcare Providers: BankingDealers.co.za  This test is not yet approved or  cleared  by the [place] and has been authorized for detection and/or diagnosis of SARS-CoV-2 by FDA under an Emergency Use Authorization (EUA).  This EUA will remain in effect (meaning this test can be used) for the duration of the COVID-19 declaration under Section 564(b)(1) of the Act, 21 U.S.C. section 360bbb-3(b)(1), unless the authorization is terminated or revoked sooner.  Performed at [facility], [place] [address] [place], [place] [address]      Time coordinating discharge:  40 minutes  SIGNED:   Barb Merino, MD  [facility] 11/02/2019, 1:10 PM

## 2019-11-02 NOTE — Consult Note (Signed)
[place]: Referral made yesterday to hospice. Meeting wife in pt room at 10:00am this morning. Thank you for this referral. Will keep updated. Doroteo Glassman, RN [phone]

## 2019-11-02 NOTE — Progress Notes (Signed)
Given report to receiving RN of [facility] in [facility]; Peripheral IV still in place as per instruction; [facility] here to transport pt.Family in room as of this time.

## 2019-11-02 NOTE — Care Management Important Message (Signed)
Important Message  Patient Details  Name: [name] MRN: [id number] Date of Birth: Oct 02, 1933   Medicare Important Message Given:  Yes     [name] [address] 11/02/2019, 3:38 PM

## 2019-11-07 ENCOUNTER — Telehealth: Payer: Self-pay

## 2019-11-07 NOTE — Telephone Encounter (Signed)
Pt passed away on 11/08/19.

## 2019-11-14 NOTE — Telephone Encounter (Signed)
Spoke with the patient's wife, condolences provided

## 2019-11-19 DEATH — deceased

## 2019-11-29 ENCOUNTER — Ambulatory Visit: Payer: Medicare Other | Admitting: Internal Medicine

## 2019-12-15 ENCOUNTER — Ambulatory Visit: Payer: Medicare Other | Admitting: Neurology

## 2019-12-23 ENCOUNTER — Other Ambulatory Visit: Payer: Self-pay | Admitting: Internal Medicine

## 2020-01-08 ENCOUNTER — Ambulatory Visit: Payer: Medicare Other | Admitting: Cardiology

## 2020-02-14 ENCOUNTER — Ambulatory Visit: Payer: Medicare Other | Admitting: Internal Medicine

## 2020-07-03 ENCOUNTER — Telehealth: Payer: Self-pay | Admitting: Internal Medicine

## 2020-07-03 NOTE — Telephone Encounter (Signed)
The patient's wife [name] reach out to me today. I was the primary care for [name], her husband. He was admitted to the hospital after a fall and multiple fractures on 10/28/2019, discharged to hospice 2019-11-14 and finally died on 11-16-22.  She has some difficult time dealing with her loss, she somewhat confused about the rapid deterioration of her husband while he was in the hospital.  Advised patient that in general he was in very poor health, he had significant injuries from the fall that led to the admission, all the appropriate teams were consulted. The hospital team talked to her on 10/31/2019 about the patient's poor prognosis and rapid decline and the decision was made to proceed with hospice, [name]  did not recall this conversation.   After reviewing the chart together, she felt better, advised that in my professional opinion, hospice was a most appropriate decision.  Encouraged to seek counseling if needed or discussed with PCP.  She knows she can call me back if she has more questions.

## 2021-04-03 IMAGING — CT CT HEAD W/O CM
4 series · 16 of 47 positions shown, 18 images · non-contrast
Comparison: 07/18/2019

CLINICAL DATA: Fell downstairs, posterior scalp laceration,
Parkinson's, dementia

EXAM:
CT HEAD WITHOUT CONTRAST
CT CERVICAL SPINE WITHOUT CONTRAST
TECHNIQUE: Multidetector CT imaging of the head and cervical spine was
performed following the standard protocol without intravenous
contrast. Multiplanar CT image reconstructions of the cervical spine
were also generated.

[Series 3: head wo · axial · 0.39mm/px · z∈[-140,-30]mm · 6 of 32 slices shown, 8 images]
[im 5/32  brain]
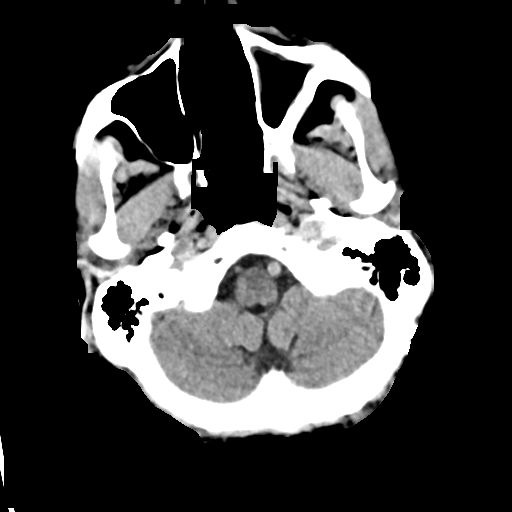
[im 5/32  bone]
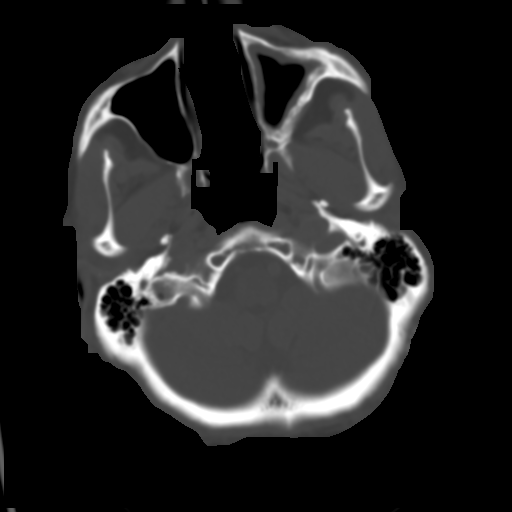
[im 9/32  brain]
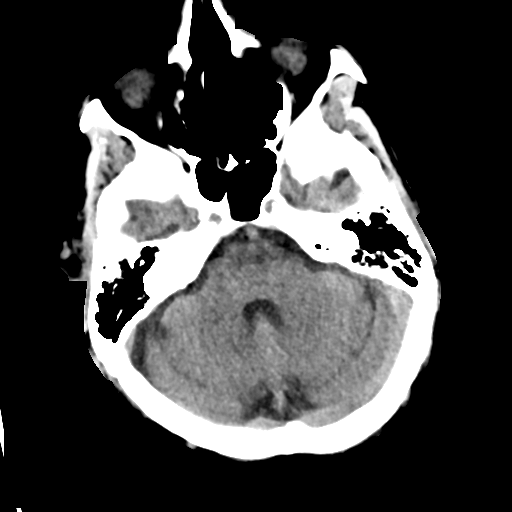
[im 14/32  brain]
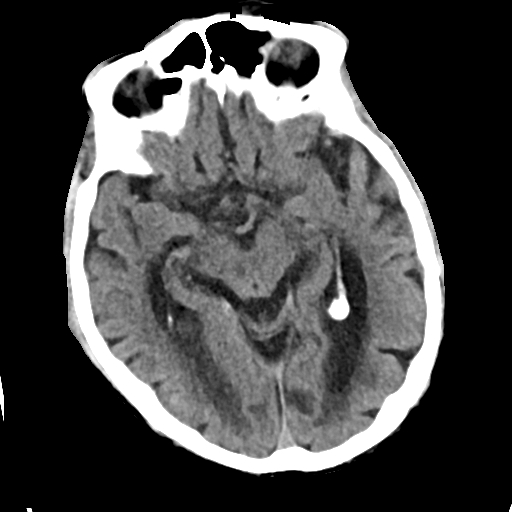
[im 18/32  brain]
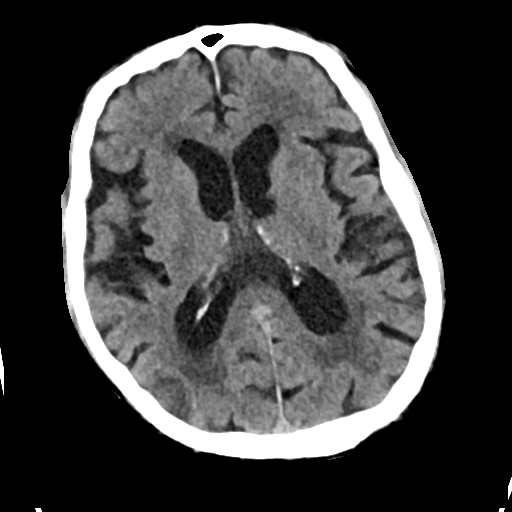
[im 23/32  brain]
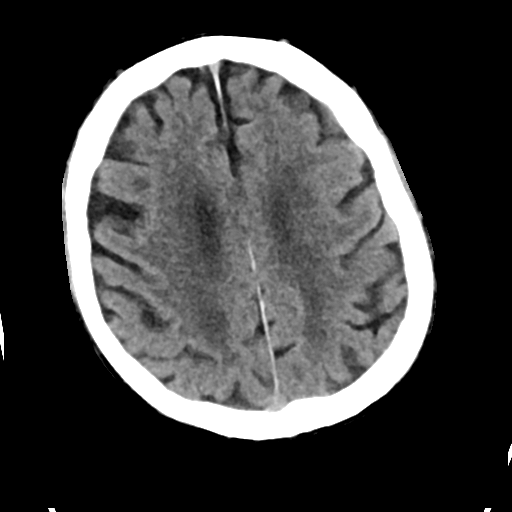
[im 23/32  bone]
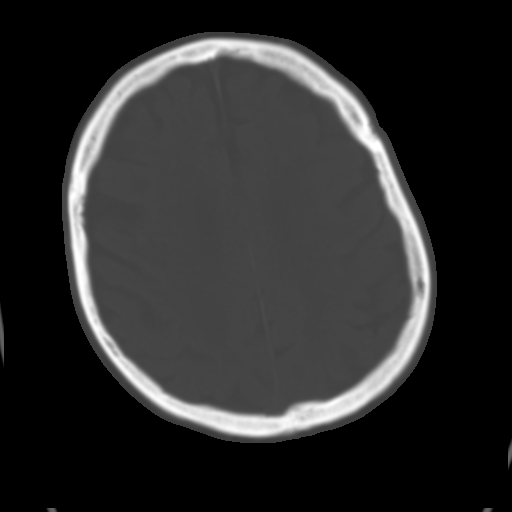
[im 27/32  brain]
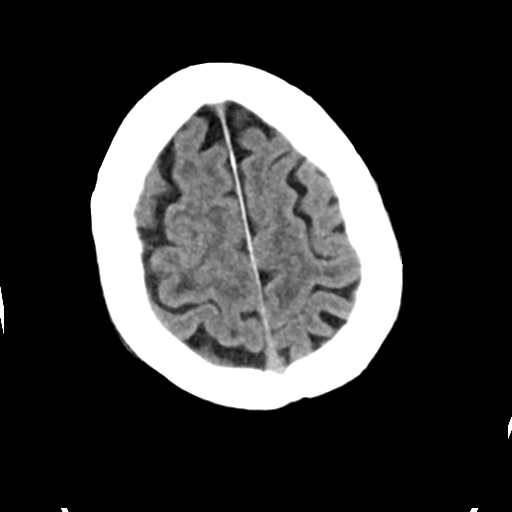

[Series 4: head bone · axial · 0.39mm/px · z∈[-146,-92]mm · 4 of 81 slices shown]
[im 8/81  bone]
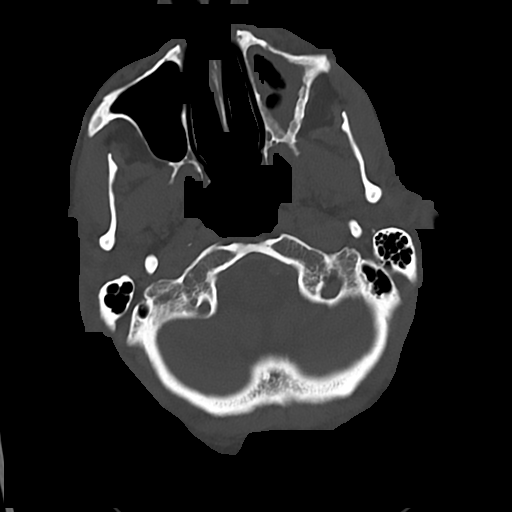
[im 16/81  bone]
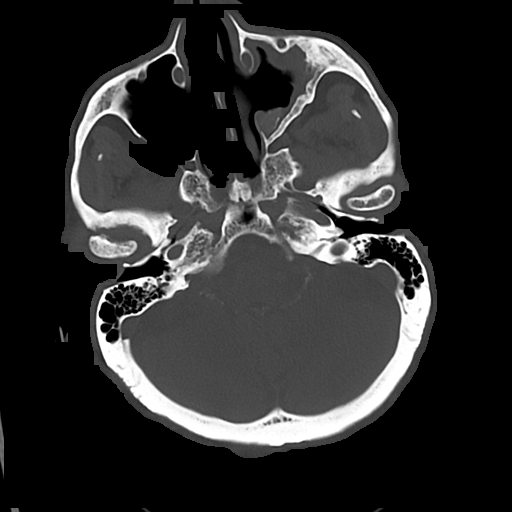
[im 27/81  bone]
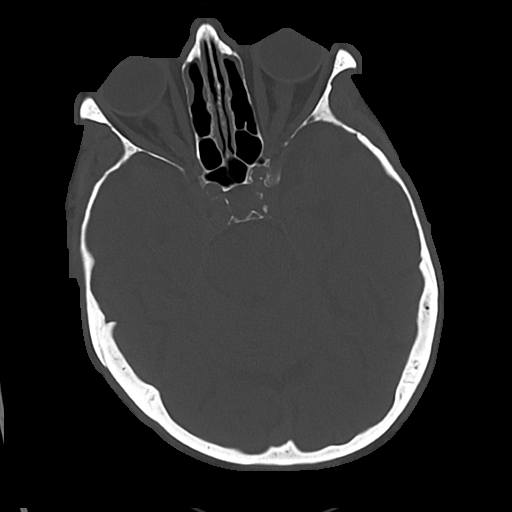
[im 35/81  bone]
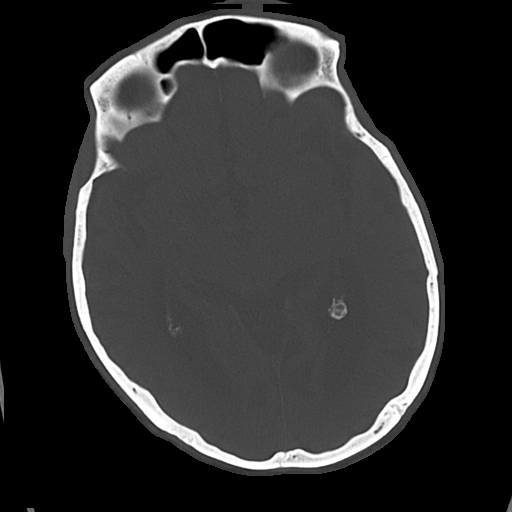

[Series 5: cor soft · coronal · 0.32mm/px · 3 of 70 slices shown]
[im 24/70  brain]
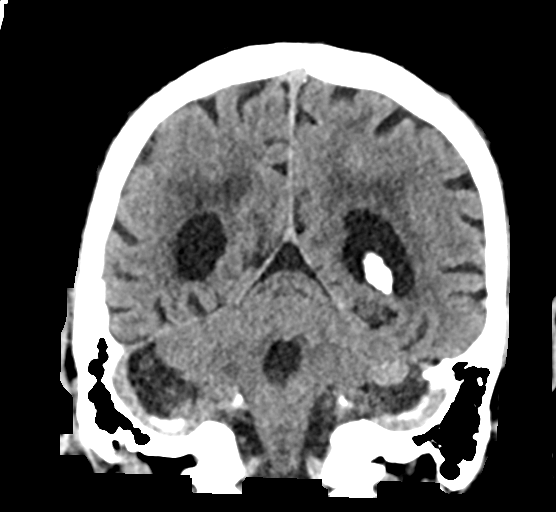
[im 31/70  brain]
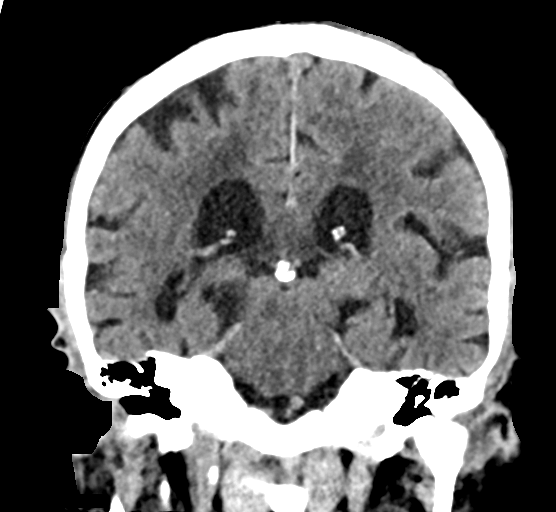
[im 39/70  brain]
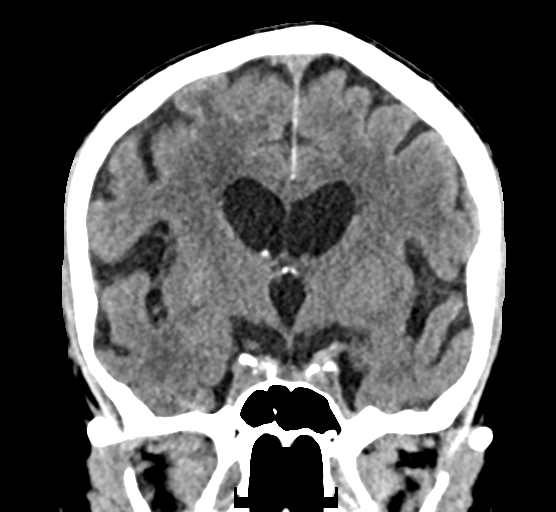

[Series 6: sag soft · sagittal · 0.32mm/px · 3 of 60 slices shown]
[im 20/60  brain]
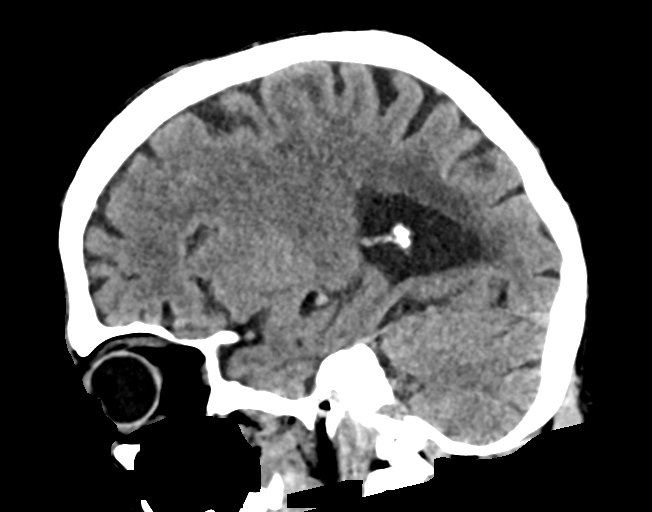
[im 30/60  brain]
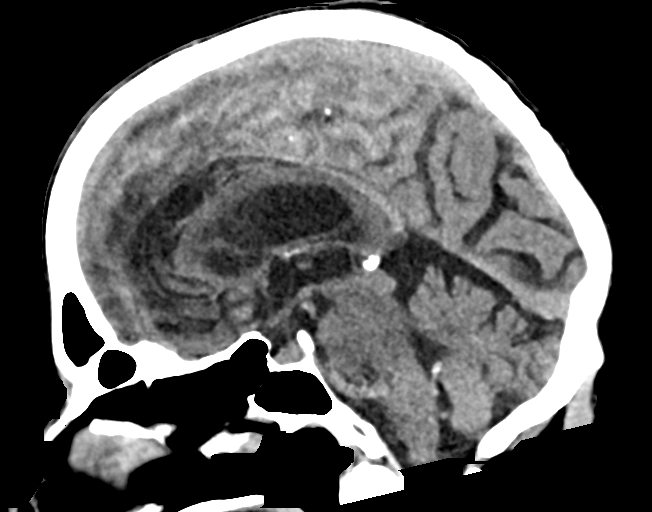
[im 40/60  brain]
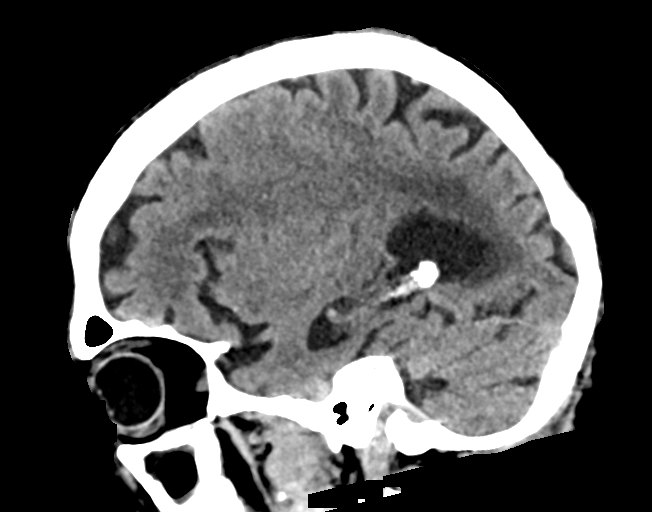

[16 of 47 positions shown; findings below may reference images not displayed]

FINDINGS: CT HEAD FINDINGS

Brain: Confluent areas of decreased attenuation throughout the
periventricular white matter consistent with chronic small vessel
ischemic changes, stable. No sign of acute infarct or hemorrhage.
Lateral ventricles and remaining midline structures are
unremarkable. No acute extra-axial fluid collections. No mass
effect.

Vascular: No hyperdense vessel or unexpected calcification.

Skull: Normal. Negative for fracture or focal lesion.

Sinuses/Orbits: Postsurgical changes bilateral maxillary sinuses.
There is chronic mucosal thickening throughout the left maxillary
sinus. Evidence of prior bilateral ethmoidectomies.

Other: None.

CT CERVICAL SPINE FINDINGS

Alignment: Alignment is grossly anatomic.

Skull base and vertebrae: No acute displaced fractures.

Soft tissues and spinal canal: No prevertebral fluid or swelling. No
visible canal hematoma.

Disc levels: Mild multilevel spondylosis greatest at C4-5. There is
diffuse facet hypertrophy. There is right predominant neural
foraminal encroachment at the C4/C5 level.

Upper chest: Airway is patent. Lung apices are clear.

Other: Reconstructed images demonstrate no additional findings.
IMPRESSION: 1. No acute intracranial process. Stable chronic small-vessel
ischemic changes throughout the white matter.
2. No acute cervical spine fracture.

## 2021-04-03 IMAGING — DX DG FEMUR 2+V*R*
5 series · 5 of 5 positions shown · non-contrast
Comparison: None.

CLINICAL DATA: Status post fall.

EXAM:
RIGHT FEMUR 2 VIEWS

[femur ap (1 of 3)]
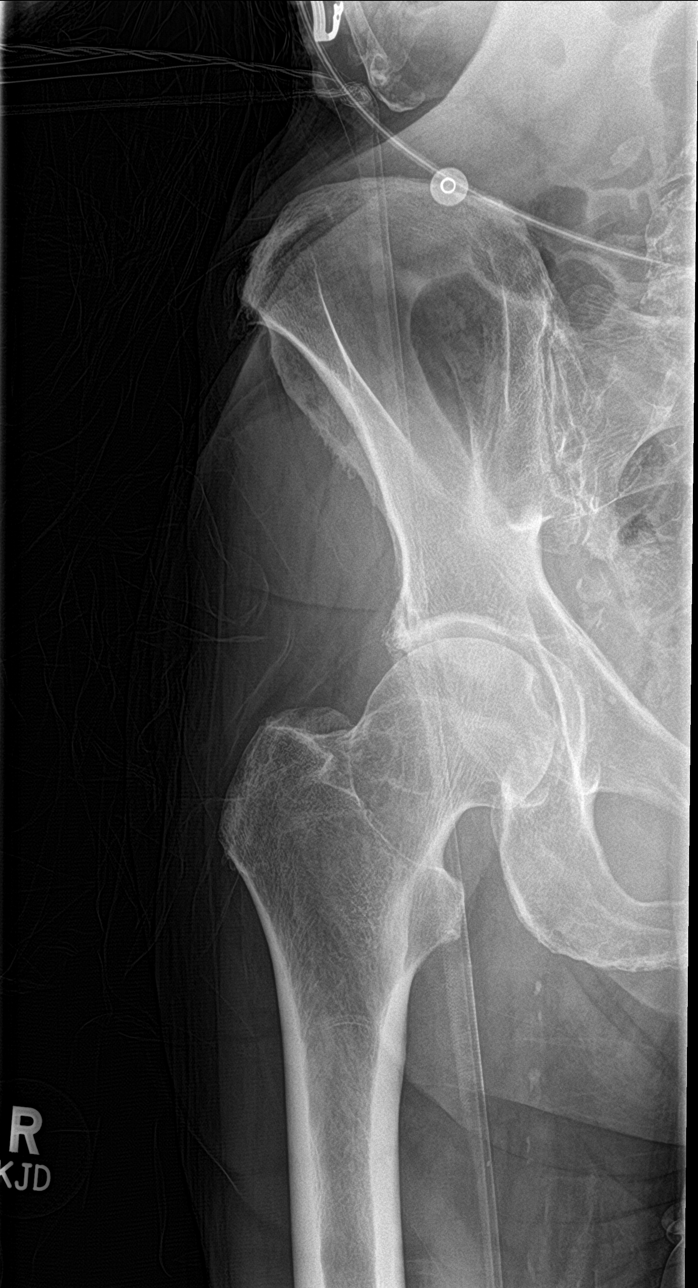

[femur ap (2 of 3)]
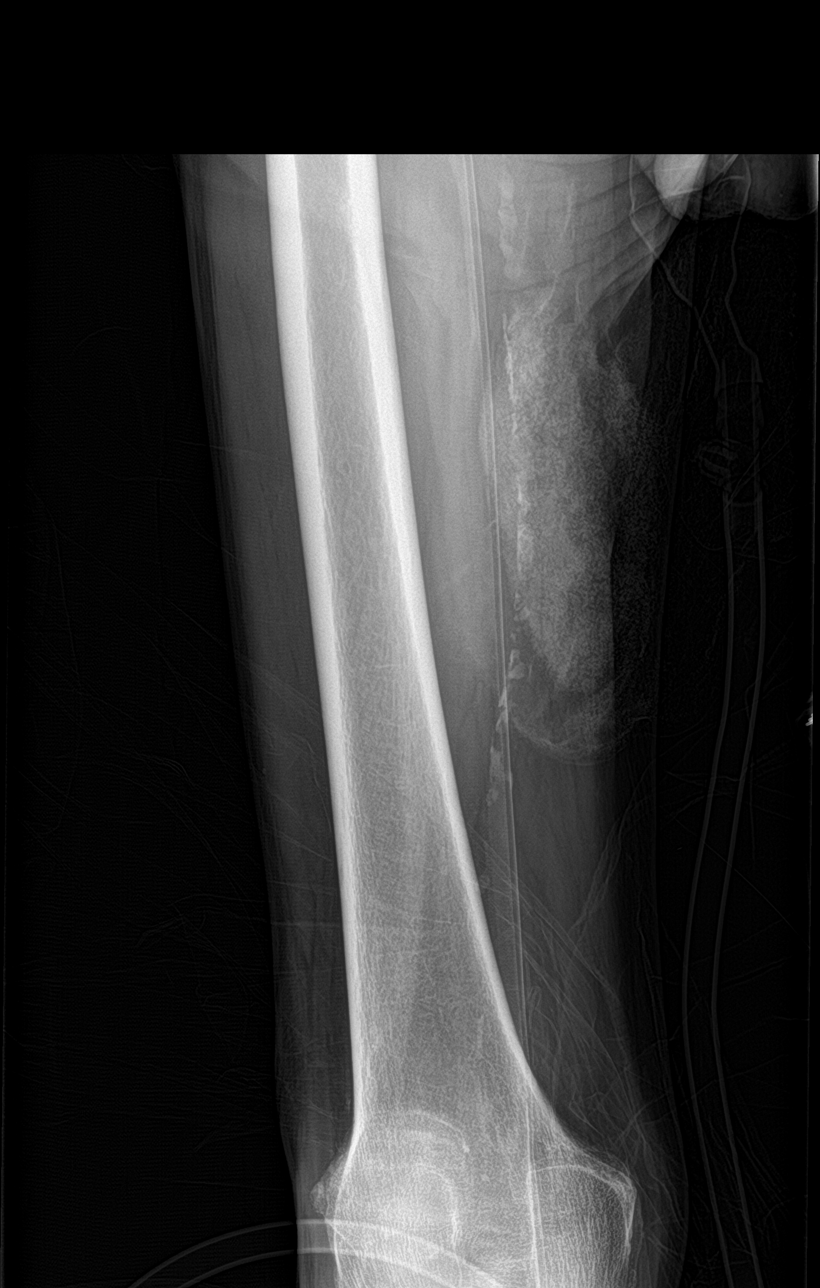

[femur lat (1 of 2)]
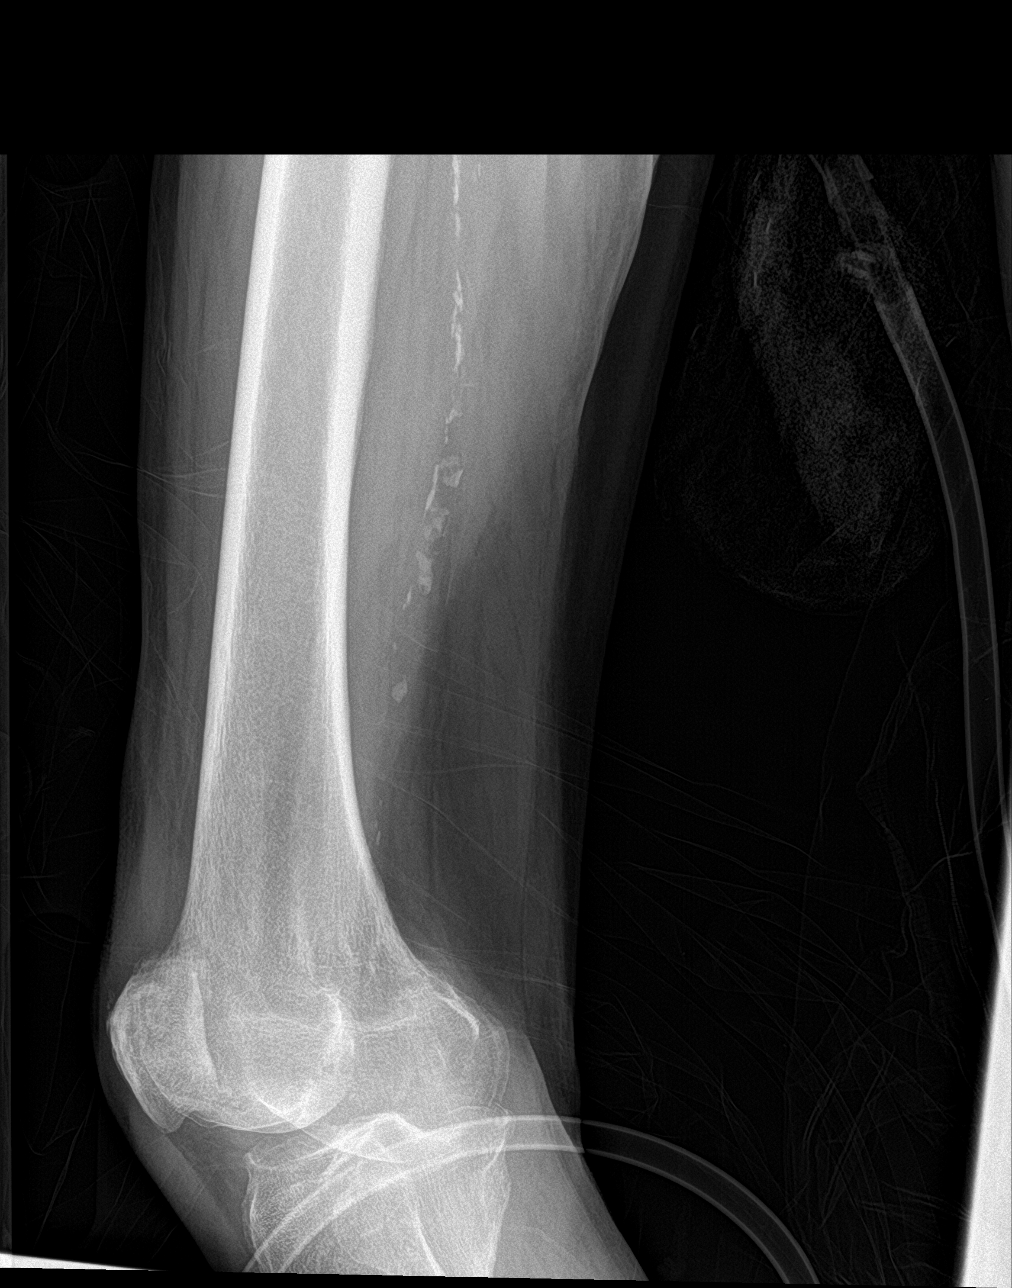

[femur lat (2 of 2)]
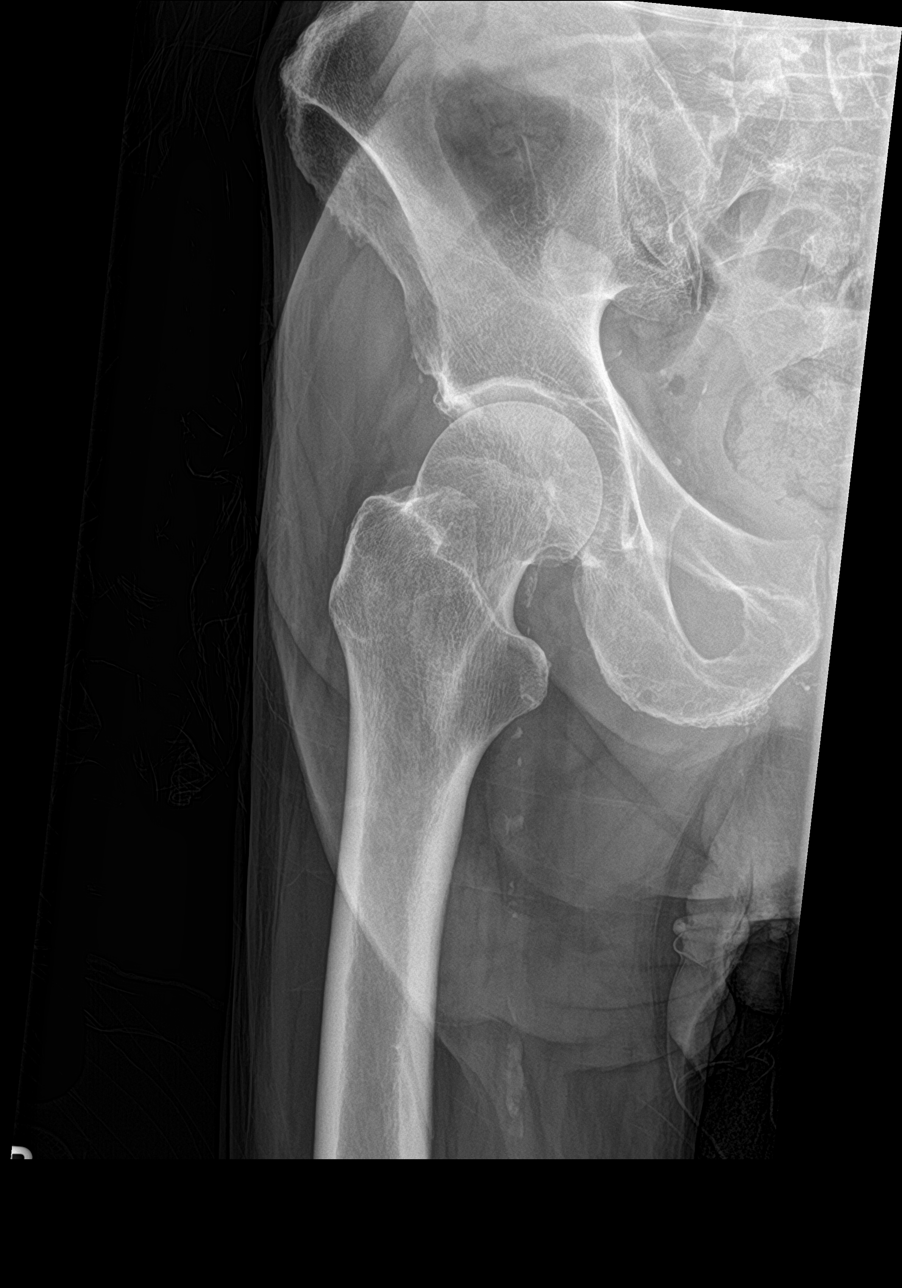

[femur ap (3 of 3)]
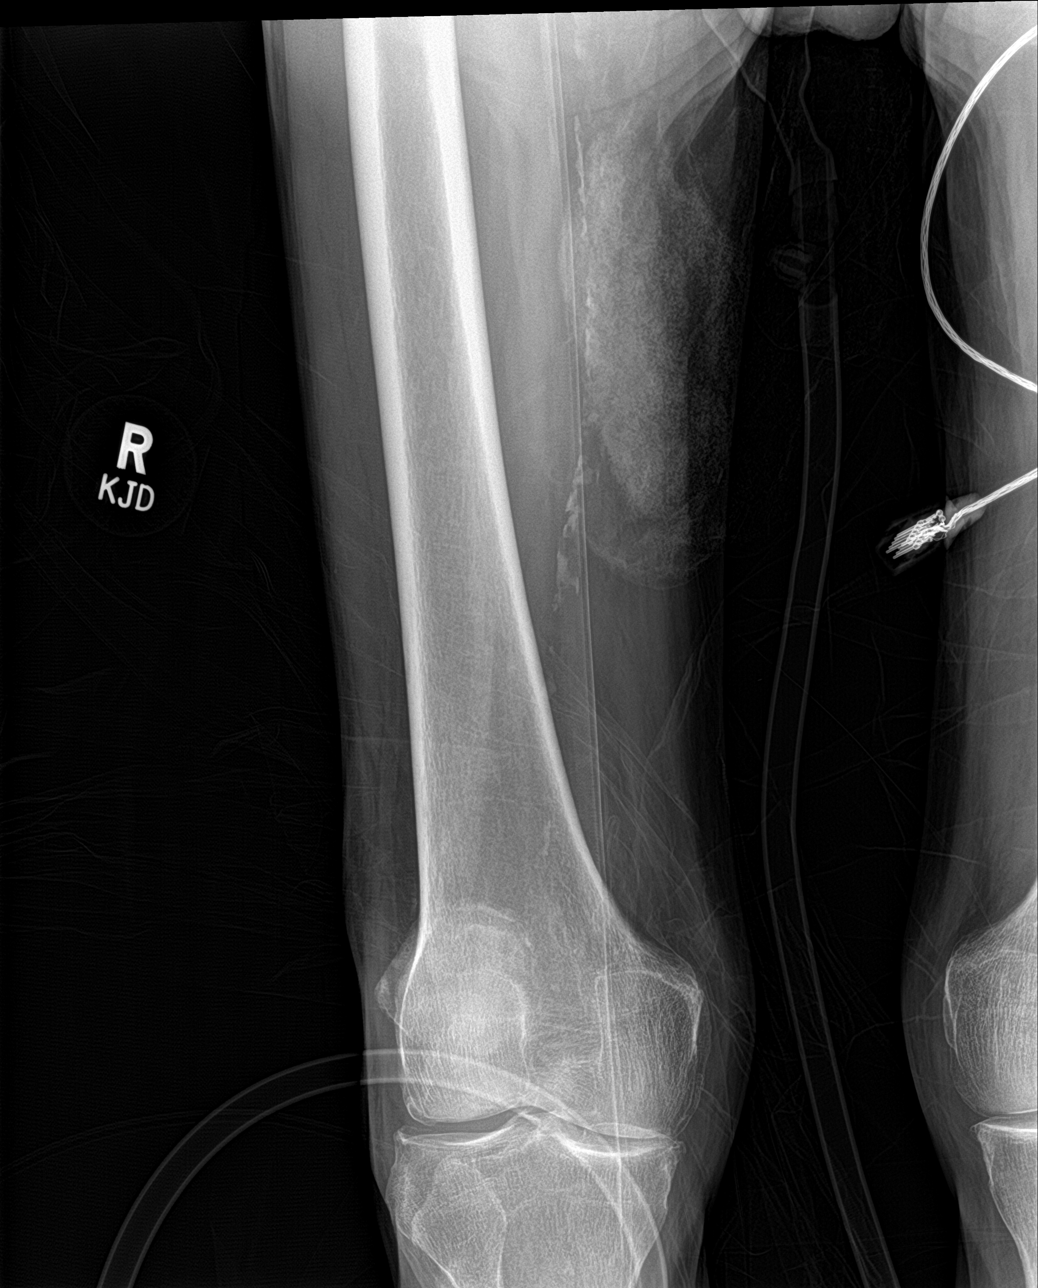

[5 of 5 positions shown; findings below may reference images not displayed]

FINDINGS: There is no evidence of fracture or other focal bone lesions. There
is moderate to marked severity vascular calcification.
IMPRESSION: 1. No acute osseous abnormality.
